# Patient Record
Sex: Female | Born: 1955 | Race: White | Hispanic: No | Marital: Married | Smoking: Former smoker
Health system: Southern US, Community
[De-identification: ages and names within clinical notes are randomized; demographics above are authoritative.]

## PROBLEM LIST (undated history)

## (undated) DIAGNOSIS — F32A Depression, unspecified: Secondary | ICD-10-CM

## (undated) DIAGNOSIS — R0902 Hypoxemia: Secondary | ICD-10-CM

## (undated) DIAGNOSIS — R519 Headache, unspecified: Secondary | ICD-10-CM

## (undated) DIAGNOSIS — K7689 Other specified diseases of liver: Secondary | ICD-10-CM

## (undated) DIAGNOSIS — I700 Atherosclerosis of aorta: Secondary | ICD-10-CM

## (undated) DIAGNOSIS — J209 Acute bronchitis, unspecified: Secondary | ICD-10-CM

## (undated) DIAGNOSIS — J439 Emphysema, unspecified: Secondary | ICD-10-CM

## (undated) DIAGNOSIS — D126 Benign neoplasm of colon, unspecified: Secondary | ICD-10-CM

## (undated) DIAGNOSIS — E785 Hyperlipidemia, unspecified: Secondary | ICD-10-CM

## (undated) DIAGNOSIS — J45909 Unspecified asthma, uncomplicated: Secondary | ICD-10-CM

## (undated) DIAGNOSIS — G8929 Other chronic pain: Secondary | ICD-10-CM

## (undated) DIAGNOSIS — T4145XA Adverse effect of unspecified anesthetic, initial encounter: Secondary | ICD-10-CM

## (undated) DIAGNOSIS — F329 Major depressive disorder, single episode, unspecified: Secondary | ICD-10-CM

## (undated) DIAGNOSIS — C6991 Malignant neoplasm of unspecified site of right eye: Secondary | ICD-10-CM

## (undated) DIAGNOSIS — R51 Headache: Secondary | ICD-10-CM

## (undated) DIAGNOSIS — K449 Diaphragmatic hernia without obstruction or gangrene: Secondary | ICD-10-CM

## (undated) DIAGNOSIS — F419 Anxiety disorder, unspecified: Secondary | ICD-10-CM

## (undated) DIAGNOSIS — I10 Essential (primary) hypertension: Secondary | ICD-10-CM

## (undated) DIAGNOSIS — D4620 Refractory anemia with excess of blasts, unspecified: Secondary | ICD-10-CM

## (undated) DIAGNOSIS — T7840XA Allergy, unspecified, initial encounter: Secondary | ICD-10-CM

## (undated) DIAGNOSIS — C801 Malignant (primary) neoplasm, unspecified: Secondary | ICD-10-CM

## (undated) DIAGNOSIS — Z201 Contact with and (suspected) exposure to tuberculosis: Secondary | ICD-10-CM

## (undated) DIAGNOSIS — I452 Bifascicular block: Secondary | ICD-10-CM

## (undated) DIAGNOSIS — J449 Chronic obstructive pulmonary disease, unspecified: Secondary | ICD-10-CM

## (undated) DIAGNOSIS — K219 Gastro-esophageal reflux disease without esophagitis: Secondary | ICD-10-CM

## (undated) DIAGNOSIS — K5790 Diverticulosis of intestine, part unspecified, without perforation or abscess without bleeding: Secondary | ICD-10-CM

## (undated) HISTORY — DX: Malignant neoplasm of unspecified site of right eye: C69.91

## (undated) HISTORY — DX: Headache: R51

## (undated) HISTORY — DX: Hypoxemia: R09.02

## (undated) HISTORY — PX: TOTAL ABDOMINAL HYSTERECTOMY: SHX209

## (undated) HISTORY — DX: Other specified diseases of liver: K76.89

## (undated) HISTORY — DX: Refractory anemia with excess of blasts, unspecified: D46.20

## (undated) HISTORY — PX: TUBAL LIGATION: SHX77

## (undated) HISTORY — DX: Diaphragmatic hernia without obstruction or gangrene: K44.9

## (undated) HISTORY — DX: Hyperlipidemia, unspecified: E78.5

## (undated) HISTORY — DX: Essential (primary) hypertension: I10

## (undated) HISTORY — DX: Emphysema, unspecified: J43.9

## (undated) HISTORY — DX: Other chronic pain: G89.29

## (undated) HISTORY — DX: Bifascicular block: I45.2

## (undated) HISTORY — PX: NOSE SURGERY: SHX723

## (undated) HISTORY — DX: Unspecified asthma, uncomplicated: J45.909

## (undated) HISTORY — DX: Acute bronchitis, unspecified: J20.9

## (undated) HISTORY — DX: Gastro-esophageal reflux disease without esophagitis: K21.9

## (undated) HISTORY — DX: Atherosclerosis of aorta: I70.0

## (undated) HISTORY — DX: Diverticulosis of intestine, part unspecified, without perforation or abscess without bleeding: K57.90

## (undated) HISTORY — DX: Benign neoplasm of colon, unspecified: D12.6

## (undated) HISTORY — DX: Headache, unspecified: R51.9

## (undated) HISTORY — DX: Allergy, unspecified, initial encounter: T78.40XA

## (undated) HISTORY — DX: Contact with and (suspected) exposure to tuberculosis: Z20.1

---
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
## 1997-12-12 ENCOUNTER — Ambulatory Visit (HOSPITAL_COMMUNITY): Admission: RE | Admit: 1997-12-12 | Discharge: 1997-12-12 | Payer: Self-pay

## 1997-12-19 ENCOUNTER — Ambulatory Visit (HOSPITAL_COMMUNITY): Admission: RE | Admit: 1997-12-19 | Discharge: 1997-12-19 | Payer: Self-pay

## 1998-06-19 ENCOUNTER — Ambulatory Visit (HOSPITAL_COMMUNITY): Admission: RE | Admit: 1998-06-19 | Discharge: 1998-06-19 | Payer: Self-pay | Admitting: Obstetrics and Gynecology

## 1998-06-19 ENCOUNTER — Encounter: Payer: Self-pay | Admitting: Obstetrics and Gynecology

## 1998-12-23 ENCOUNTER — Encounter: Payer: Self-pay | Admitting: Obstetrics and Gynecology

## 1998-12-23 ENCOUNTER — Ambulatory Visit (HOSPITAL_COMMUNITY): Admission: RE | Admit: 1998-12-23 | Discharge: 1998-12-23 | Payer: Self-pay | Admitting: Obstetrics and Gynecology

## 1999-12-27 ENCOUNTER — Encounter: Payer: Self-pay | Admitting: Obstetrics and Gynecology

## 1999-12-27 ENCOUNTER — Ambulatory Visit (HOSPITAL_COMMUNITY): Admission: RE | Admit: 1999-12-27 | Discharge: 1999-12-27 | Payer: Self-pay | Admitting: Obstetrics and Gynecology

## 2000-01-11 HISTORY — PX: BREAST BIOPSY: SHX20

## 2001-01-15 ENCOUNTER — Encounter: Payer: Self-pay | Admitting: Obstetrics and Gynecology

## 2001-01-15 ENCOUNTER — Ambulatory Visit (HOSPITAL_COMMUNITY): Admission: RE | Admit: 2001-01-15 | Discharge: 2001-01-15 | Payer: Self-pay | Admitting: Obstetrics and Gynecology

## 2002-01-25 ENCOUNTER — Ambulatory Visit (HOSPITAL_COMMUNITY): Admission: RE | Admit: 2002-01-25 | Discharge: 2002-01-25 | Payer: Self-pay | Admitting: Obstetrics and Gynecology

## 2002-01-25 ENCOUNTER — Encounter: Payer: Self-pay | Admitting: Obstetrics and Gynecology

## 2003-03-07 ENCOUNTER — Ambulatory Visit (HOSPITAL_COMMUNITY): Admission: RE | Admit: 2003-03-07 | Discharge: 2003-03-07 | Payer: Self-pay | Admitting: Obstetrics and Gynecology

## 2003-12-06 ENCOUNTER — Emergency Department (HOSPITAL_COMMUNITY): Admission: EM | Admit: 2003-12-06 | Discharge: 2003-12-06 | Payer: Self-pay | Admitting: Emergency Medicine

## 2004-05-14 ENCOUNTER — Ambulatory Visit (HOSPITAL_COMMUNITY): Admission: RE | Admit: 2004-05-14 | Discharge: 2004-05-14 | Payer: Self-pay | Admitting: Obstetrics and Gynecology

## 2005-06-03 ENCOUNTER — Ambulatory Visit (HOSPITAL_COMMUNITY): Admission: RE | Admit: 2005-06-03 | Discharge: 2005-06-03 | Payer: Self-pay | Admitting: Obstetrics and Gynecology

## 2006-08-04 ENCOUNTER — Ambulatory Visit (HOSPITAL_COMMUNITY): Admission: RE | Admit: 2006-08-04 | Discharge: 2006-08-04 | Payer: Self-pay | Admitting: Obstetrics and Gynecology

## 2007-03-02 ENCOUNTER — Encounter: Payer: Self-pay | Admitting: Internal Medicine

## 2007-09-07 ENCOUNTER — Ambulatory Visit (HOSPITAL_COMMUNITY): Admission: RE | Admit: 2007-09-07 | Discharge: 2007-09-07 | Payer: Self-pay | Admitting: Obstetrics and Gynecology

## 2008-02-22 ENCOUNTER — Encounter: Payer: Self-pay | Admitting: Internal Medicine

## 2008-04-18 ENCOUNTER — Ambulatory Visit: Payer: Self-pay | Admitting: Internal Medicine

## 2008-04-18 DIAGNOSIS — J449 Chronic obstructive pulmonary disease, unspecified: Secondary | ICD-10-CM | POA: Insufficient documentation

## 2008-04-18 LAB — CONVERTED CEMR LAB: A-1 Antitrypsin, Ser: 148 mg/dL (ref 83–200)

## 2008-04-25 ENCOUNTER — Encounter: Payer: Self-pay | Admitting: Internal Medicine

## 2008-05-03 DIAGNOSIS — J302 Other seasonal allergic rhinitis: Secondary | ICD-10-CM | POA: Insufficient documentation

## 2008-05-03 DIAGNOSIS — I10 Essential (primary) hypertension: Secondary | ICD-10-CM | POA: Insufficient documentation

## 2008-05-03 DIAGNOSIS — K219 Gastro-esophageal reflux disease without esophagitis: Secondary | ICD-10-CM | POA: Insufficient documentation

## 2008-05-03 DIAGNOSIS — J3089 Other allergic rhinitis: Secondary | ICD-10-CM

## 2008-06-27 ENCOUNTER — Ambulatory Visit: Payer: Self-pay | Admitting: Internal Medicine

## 2008-06-27 DIAGNOSIS — R0609 Other forms of dyspnea: Secondary | ICD-10-CM | POA: Insufficient documentation

## 2008-06-27 DIAGNOSIS — R0989 Other specified symptoms and signs involving the circulatory and respiratory systems: Secondary | ICD-10-CM | POA: Insufficient documentation

## 2008-07-22 ENCOUNTER — Telehealth: Payer: Self-pay | Admitting: Internal Medicine

## 2008-09-26 ENCOUNTER — Ambulatory Visit (HOSPITAL_COMMUNITY): Admission: RE | Admit: 2008-09-26 | Discharge: 2008-09-26 | Payer: Self-pay | Admitting: Obstetrics and Gynecology

## 2008-10-08 ENCOUNTER — Telehealth: Payer: Self-pay | Admitting: Internal Medicine

## 2008-10-31 ENCOUNTER — Ambulatory Visit: Payer: Self-pay | Admitting: Internal Medicine

## 2008-10-31 DIAGNOSIS — N39498 Other specified urinary incontinence: Secondary | ICD-10-CM | POA: Insufficient documentation

## 2008-10-31 DIAGNOSIS — G4710 Hypersomnia, unspecified: Secondary | ICD-10-CM | POA: Insufficient documentation

## 2008-11-05 ENCOUNTER — Telehealth: Payer: Self-pay | Admitting: Internal Medicine

## 2008-11-19 ENCOUNTER — Encounter: Payer: Self-pay | Admitting: Internal Medicine

## 2008-11-19 ENCOUNTER — Ambulatory Visit (HOSPITAL_BASED_OUTPATIENT_CLINIC_OR_DEPARTMENT_OTHER): Admission: RE | Admit: 2008-11-19 | Discharge: 2008-11-19 | Payer: Self-pay | Admitting: Internal Medicine

## 2008-11-24 ENCOUNTER — Encounter: Payer: Self-pay | Admitting: Internal Medicine

## 2008-11-30 ENCOUNTER — Ambulatory Visit: Payer: Self-pay | Admitting: Internal Medicine

## 2008-12-08 ENCOUNTER — Ambulatory Visit: Payer: Self-pay | Admitting: Internal Medicine

## 2008-12-08 ENCOUNTER — Telehealth (INDEPENDENT_AMBULATORY_CARE_PROVIDER_SITE_OTHER): Payer: Self-pay | Admitting: *Deleted

## 2008-12-12 ENCOUNTER — Telehealth (INDEPENDENT_AMBULATORY_CARE_PROVIDER_SITE_OTHER): Payer: Self-pay | Admitting: *Deleted

## 2008-12-12 ENCOUNTER — Ambulatory Visit: Payer: Self-pay | Admitting: Internal Medicine

## 2009-01-05 ENCOUNTER — Telehealth (INDEPENDENT_AMBULATORY_CARE_PROVIDER_SITE_OTHER): Payer: Self-pay | Admitting: *Deleted

## 2009-01-08 ENCOUNTER — Telehealth: Payer: Self-pay | Admitting: Internal Medicine

## 2009-01-08 ENCOUNTER — Encounter: Payer: Self-pay | Admitting: Internal Medicine

## 2009-06-12 ENCOUNTER — Encounter: Payer: Self-pay | Admitting: Internal Medicine

## 2009-09-28 ENCOUNTER — Ambulatory Visit: Payer: Self-pay | Admitting: Internal Medicine

## 2009-09-28 DIAGNOSIS — J209 Acute bronchitis, unspecified: Secondary | ICD-10-CM | POA: Insufficient documentation

## 2009-10-05 ENCOUNTER — Telehealth (INDEPENDENT_AMBULATORY_CARE_PROVIDER_SITE_OTHER): Payer: Self-pay | Admitting: *Deleted

## 2009-10-23 ENCOUNTER — Ambulatory Visit (HOSPITAL_COMMUNITY): Admission: RE | Admit: 2009-10-23 | Discharge: 2009-10-23 | Payer: Self-pay | Admitting: Obstetrics and Gynecology

## 2010-02-05 ENCOUNTER — Ambulatory Visit: Admit: 2010-02-05 | Payer: Self-pay | Admitting: Internal Medicine

## 2010-02-09 NOTE — Progress Notes (Signed)
Summary: RX REQ/ STILL SICK - rx zithromax  Phone Note Call from Patient   Caller: Patient Call For: YOUNG Summary of Call: PT WAS SEEN LAST WK FOR SOB/ COUGH/ NASAL CONGESTION. HAS 1 DAY REMAINING ON ABX. STATES SHE IS "NOT ANY BETTER AT ALL". [facility] DRUG # [phone]. PT # [id number] Initial call taken by: Tivis Ringer, CNA,  October 05, 2009 10:25 AM  Follow-up for Phone Call        pt last seen by CY on 09/28/2009.  Pt was given rx for doxycycline and tussionex and told to take mucinex and finish pred taper.  [id number]. Arman Filter LPN  October 05, 2009 10:40 AM   pt returned our call.  Pt states she has seen only "very little improvement" since her ov with CY on 09/28/2009.  Pt states she has finished pred taper and took last dose of abx today.  Pt c/o head and chest congestion, still coughing up green to brown sputum, PND, "hot flashes", sneezing, sore throat and increased sob.  Pt states she has needed to use rescue hfa 4 x daily to help with increased sob.  Pt requests rx for another week of abx.  Will forward message to CY to address.  Aundra Millet Reynolds LPN  October 05, 2009 10:48 AM   Allergies:  Biaxin, Codeine, PCN  Additional Follow-up for Phone Call Additional follow up Details #1::        If she can take Z-pak, that would be a good choice now, along with lots of fluids to keep mucus thin.  Additional Follow-up by: Waymon Budge MD,  October 05, 2009 12:29 PM    Additional Follow-up for Phone Call Additional follow up Details #2::    Spoke with pt and advised that zpack was sent and also that she should increase sputum to keep mucus thin.  Pt verbalized understanding. Follow-up by: Vernie Murders,  October 05, 2009 1:16 PM  New/Updated Medications: ZITHROMAX Z-PAK 250 MG TABS (AZITHROMYCIN) take as directed Prescriptions: ZITHROMAX Z-PAK 250 MG TABS (AZITHROMYCIN) take as directed  #1 x 0   Entered by:   Vernie Murders   Authorized by:   Waymon Budge MD   Signed by:   Vernie Murders on 10/05/2009   Method used:   Electronically to        [facility] (retail)       [address]       [place], [place]  [address]       Ph: [phone]       Fax: [phone]   RxID:   [id number]

## 2010-02-09 NOTE — Letter (Signed)
[facility]   Imported By: Sherian Rein 06/22/2009 10:57:57  _____________________________________________________________________  External Attachment:    Type:   Image     Comment:   External Document

## 2010-02-09 NOTE — Assessment & Plan Note (Signed)
Summary: sick/cb   Primary Provider/Referring Provider:  Belenda Cruise Hicks/ [place]  CC:  Acute visit, pt became sick on cruise, given prednisone taper, symptons are prod cough green , increase sob, nasal congestion x 10 days, and using resue inhaler with little relief.  History of Present Illness: December 08, 2008- COPD Acute sick visit. 6 days, head and chest congested, runny nose, coughing green;brown. Temp to 100, 2 days ago. Coworker has been sick.  28-Dec-2008- COPD,  She returns for f/u of acute visit 11/29 and also for f/u of sleep study. Chest better, with residual mucus and congestion she can"t quite get up. Residual soreness through mid chest substernal area with pressure during inhalation. No fever now. Head congestion is better. Sleep- Extreme chronic fatigue. Not sleepy, but gives out easily. Worn out by walk into work. She seems to be going to bed early enough, with fair sleep habit, not aware of snore or limb jerks Cardiac- RBBB hx, family hx heart disease. Heart stress test "fine" in [facility].  September 28, 2009- COPD Acute visit- Was feeling bad "allergy to ragweed" with malaise, throat tickle. Went to [place] [place] 10 days ago, left for cruise next day. Sicker on cruise given prednisone taper and she used her inhalers. Got back from cruise 4 days ago. Bedridden, coughing, sweating, fever with nasal congestion, chest congestion. Feels "burning up" but temp now measures 98. Cough productive green.     Preventive Screening-Counseling & Management  Alcohol-Tobacco     Smoking Status: quit > 6 months     Year Quit: 2009  Current Medications (verified): 1)  Ventolin Hfa 108 (90 Base) Mcg/act Aers (Albuterol Sulfate) .... 2 Puffs Four Times A Day As Needed 2)  Spiriva Handihaler 18 Mcg Caps (Tiotropium Bromide Monohydrate) .... Once Daily 3)  Omeprazole 40 Mg Cpdr (Omeprazole) .... Once Daily 4)  Lexapro 20 Mg Tabs (Escitalopram Oxalate) .... Once Daily 5)   Pentoxifylline Cr 400 Mg Cr-Tabs (Pentoxifylline) .... Two Times A Day 6)  Hyzaar 100-25 Mg Tabs (Losartan Potassium-Hctz) .... Once Daily 7)  Enjuvia 0.625 Mg Tabs (Estrogens Conj Synthetic B) .... Once Daily 8)  Bupropion Hcl 300 Mg Xr24h-Tab (Bupropion Hcl) .... Once Daily 9)  Allegra 180 Mg Tabs (Fexofenadine Hcl) .... Once Daily As Needed 10)  Sumatriptan Succinate 100 Mg Tabs (Sumatriptan Succinate) .... As Needed Migraines 11)  Zomig 5 Mg Soln (Zolmitriptan) .... As Needed Migraines 12)  Symbicort 80-4.5 Mcg/act Aero (Budesonide-Formoterol Fumarate) .... 2 Puffs and Rinse, Twice Every Day 13)  Prednisone 10 Mg Tabs (Prednisone) .... Taper As Directed  Allergies: 1)  ! Biaxin 2)  ! Codeine 3)  ! Pcn  Past History:  Past Surgical History: Last updated: 04/18/2008 Total Abdominal Hysterectomy Tubal ligation Breast bx 2002  Family History: Last updated: December 28, 2008 Mother- died emphysema, and MI-smoker Father- died CHF  Asthma- sister, uncle Arthritis- grandmother Cancer- GM, sister, 3 aunts, uncles  Social History: Last updated: 06/27/2008 Patient states former smoker. 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 - husband       smokes  Risk Factors: Smoking Status: quit > 6 months (09/28/2009) Passive Smoke Exposure: yes (06/27/2008)  Past Medical History: ? hx childhood asthma G E R D Allergic Rhinitis Hypertension RBBB TB exposure- uncle- Her PPD NEG Chronic headaches Acute bronchitis  Social History: Smoking Status:  quit > 6 months  Review of Systems      See HPI  The patient complains of shortness of breath with activity, productive cough, and nasal congestion/difficulty breathing through nose.  The patient denies shortness of breath at rest, non-productive cough, coughing up blood, chest pain, irregular heartbeats, acid heartburn, indigestion, loss of appetite,  weight change, abdominal pain, difficulty swallowing, sore throat, tooth/dental problems, headaches, and sneezing.    Vital Signs:  Patient profile:   54 year old female Height:      62 inches Weight:      149.4 pounds BMI:     27.42 O2 Sat:      97 % on Room air Pulse rate:   112 / minute BP sitting:   140 / 84  (left arm)  Vitals Entered By: Renold Genta RCP, LPN (September 28, 2009 2:17 PM)  O2 Sat at Rest %:  99% O2 Flow:  Room air CC: Acute visit, pt became sick on cruise, given prednisone taper, symptons are prod cough green , increase sob, nasal congestion x 10 days, using resue inhaler with little relief Comments Medications reviewed with patient Renold Genta RCP, LPN  September 28, 2009 2:17 PM    Physical Exam  Additional Exam:  General: A/Ox3; pleasant and cooperative, NAD, wdwn, acutelly ill SKIN: no rash, lesions NODES: no lymphadenopathy HEENT: [place]/AT, EOM- WNL, Conjuctivae- clear, PERRLA, TM-WNL, Nose- clear, Throat- clear and wnl, Mallampatii III-IV NECK: Supple w/ fair ROM, JVD- none, normal carotid impulses w/o bruits Thyroid- normal to palpation CHEST: Hard raspy cough HEART: RRR, no m/g/r heard ABDOMEN: Soft and nl; ZOX:WRUE, nl pulses, no edema  NEURO: Grossly intact to observation      Impression & Recommendations:  Problem # 1:  BRONCHITIS, ACUTE (ICD-466.0)  Infection is much more likely than an intense ragweed allergy, which is what she thought at first she was dealing with. We will give neb, depo, script for doxycycline. She thinks she can handle hydrocodone/ tussionex. The following medications were removed from the medication list:    Avelox 400 Mg Tabs (Moxifloxacin hcl) .Marland Kitchen... Take 1 tablet by mouth once a day x 7 days Her updated medication list for this problem includes:    Ventolin Hfa 108 (90 Base) Mcg/act Aers (Albuterol sulfate) .Marland Kitchen... 2 puffs four times a day as needed    Spiriva Handihaler 18 Mcg Caps (Tiotropium bromide  monohydrate) ..... Once daily    Symbicort 80-4.5 Mcg/act Aero (Budesonide-formoterol fumarate) .Marland Kitchen... 2 puffs and rinse, twice every day    Doxycycline Hyclate 100 Mg Caps (Doxycycline hyclate) .Marland Kitchen... 2 today then one daily    Tussionex Pennkinetic Er 10-8 Mg/17ml Lqcr (Hydrocod polst-chlorphen polst) .Marland Kitchen... 1 tablespoon two times a day as needed cough  Problem # 2:  ALLERGIC RHINITIS (ICD-477.9)  Rhinitis with rhinosinusitis. Her updated medication list for this problem includes:    Allegra 180 Mg Tabs (Fexofenadine hcl) ..... Once daily as needed  Problem # 3:  G E R D (ICD-530.81) Interaction of reeflux and potential for aspiration were discussed in context of her lung disease. Her updated medication list for this problem includes:    Omeprazole 40 Mg Cpdr (Omeprazole) ..... Once daily  Medications Added to Medication List This Visit: 1)  Prednisone 10 Mg Tabs (Prednisone) .... Taper as directed 2)  Doxycycline Hyclate 100 Mg Caps (Doxycycline hyclate) .... 2 today then one daily 3)  Tussionex Pennkinetic Er 10-8 Mg/18ml Lqcr (Hydrocod polst-chlorphen polst) .Marland Kitchen.. 1 tablespoon two times a day as needed cough  Other Orders: Est. Patient Level IV (45409) Nebulizer Tx (81191) Depo- Medrol  80mg  (J1040) Admin of Therapeutic Inj  intramuscular or subcutaneous ([address])  Patient Instructions: 1)  Please schedule a follow-up appointment in 3 months. 2)  Lots of fluids and rest. Mucinex may help thin the mucus 3)  Script for doxycycline 4)  Script for Tussionex for cough 5)  Neb xop 1.25 6)  depo 80 7)  finish the last of the prednisone taper you ar on now, then we will wait and see how you do before adding more. Prescriptions: SYMBICORT 80-4.5 MCG/ACT AERO (BUDESONIDE-FORMOTEROL FUMARATE) 2 puffs and rinse, twice every day  #1 x prn   Entered and Authorized by:   Waymon Budge MD   Signed by:   Waymon Budge MD on 09/28/2009   Method used:   Print then Give to Patient   RxID:    [id number] SPIRIVA HANDIHALER 18 MCG CAPS (TIOTROPIUM BROMIDE MONOHYDRATE) once daily  #1 x prn   Entered and Authorized by:   Waymon Budge MD   Signed by:   Waymon Budge MD on 09/28/2009   Method used:   Print then Give to Patient   RxID:   [id number] TUSSIONEX PENNKINETIC ER 10-8 MG/5ML LQCR (HYDROCOD POLST-CHLORPHEN POLST) 1 tablespoon two times a day as needed cough  #200 ml x 0   Entered and Authorized by:   Waymon Budge MD   Signed by:   Waymon Budge MD on 09/28/2009   Method used:   Print then Give to Patient   RxID:   [id number] DOXYCYCLINE HYCLATE 100 MG CAPS (DOXYCYCLINE HYCLATE) 2 today then one daily  #8 x 0   Entered and Authorized by:   Waymon Budge MD   Signed by:   Waymon Budge MD on 09/28/2009   Method used:   Print then Give to Patient   RxID:   [id number]    Medication Administration  Injection # 1:    Medication: Depo- Medrol 80mg     Diagnosis: BRONCHITIS, ACUTE (ICD-466.0)    Route: IM    Site: LUOQ gluteus    Exp Date: 10/23/2009    Lot #: [id number]    Mfr: Teva    Patient tolerated injection without complications    Given by: Renold Genta RCP, LPN (September 28, 2009 5:17 PM)  Orders Added: 1)  Est. Patient Level IV [63875] 2)  Nebulizer Tx [64332] 3)  Depo- Medrol 80mg  [J1040] 4)  Admin of Therapeutic Inj  intramuscular or subcutaneous [96372]        received phone call from pharmacist @ [facility], requesting clarification on tussionex script.  okayed for rx to be changed to 1 teaspoon two times a day as needed cough.  pharmacist read this back to me for verification. Boone Master CNA/MA  September 28, 2009 5:32 PM

## 2010-02-09 NOTE — Letter (Signed)
[facility]: [facility]   Imported By: Lester Pepin 01/16/2009 08:37:36  _____________________________________________________________________  External Attachment:    Type:   Image     Comment:   External Document

## 2010-04-19 ENCOUNTER — Ambulatory Visit (INDEPENDENT_AMBULATORY_CARE_PROVIDER_SITE_OTHER): Payer: 59 | Admitting: Internal Medicine

## 2010-04-19 ENCOUNTER — Encounter: Payer: Self-pay | Admitting: Internal Medicine

## 2010-04-19 VITALS — BP 122/76 | HR 75 | Ht 62.0 in | Wt 150.0 lb

## 2010-04-19 DIAGNOSIS — J449 Chronic obstructive pulmonary disease, unspecified: Secondary | ICD-10-CM

## 2010-04-19 DIAGNOSIS — G4710 Hypersomnia, unspecified: Secondary | ICD-10-CM

## 2010-04-19 DIAGNOSIS — J4489 Other specified chronic obstructive pulmonary disease: Secondary | ICD-10-CM

## 2010-04-19 NOTE — Assessment & Plan Note (Addendum)
Severe COPD in former smoker. Mother had COPD.   Most of her complaint is not about dyspnea, cough or wheeze now. She no longer smokes and goal is to maintain stamina.

## 2010-04-19 NOTE — Patient Instructions (Addendum)
Order- See [facility] to schedule Multiple Sleep Latency Test for diagnosis of hypersomnia             - Test for a1AT

## 2010-04-19 NOTE — Progress Notes (Signed)
  Subjective:    Patient ID: Carol Quinn, female    DOB: Oct 27, 1955, 55 y.o.   MRN: [id number]  HPI 55 yoF former smoker, followed for COPD, allergic rhinitis, with hx GERD. Last here September 28, 2009 for Ragweed rhintis. Through the winter without bad infection- did take flu and pneumovax. Now for the past month she has noted progressive chest tightness "like ribs are coming out of my body", worse with exertion, not positional. Seems hard to breathe when she is having the feeling. Denies relation to meals. Feels trouble concentrating at work. Snores some. NPSG 11/19/08- AHI 0/hr. She describes deep groggy arousal from sleep, but not sleep paralysis. She's afraid of getting fired for being sleepy and too tired to come to work.Marland Kitchen  PFT- Moderate obstruction- FEV1/FVC 0.51. + response to BD; DLCO 0.49 a1AT WNL Review of Systems See HPI Constitutional:   No weight loss, night sweats,  Fevers, chills, fatigue, lassitude. HEENT:   No headaches,  Difficulty swallowing,  Tooth/dental problems,  Sore throat,                No sneezing, itching, ear ache, nasal congestion, post nasal drip,   CV:  No chest pain,  Orthopnea, PND, swelling in lower extremities, anasarca, dizziness, palpitations  GI  No heartburn, indigestion, abdominal pain, nausea, vomiting, diarrhea, change in bowel habits, loss of appetite  Resp:  at rest.  No excess mucus, no productive cough,  No non-productive cough,  No coughing up of blood.  No change in color of mucus.  No wheezing.  No chest wall deformity  Skin: no rash or lesions.  GU: no dysuria, change in color of urine, no urgency or frequency.  No flank pain.  MS:  No joint pain or swelling.  No decreased range of motion.  No back pain.  Psych:  No memory loss. Difficult to concentrate      Objective:   Physical Exam General- Alert, Oriented, Affect-appropriate, Distress- none acute  Looks tired/ depressed           Medium build  Skin- rash-none, lesions-  none, excoriation- none  Lymphadenopathy- none  Head- atraumatic  Eyes- Gross vision intact, PERRLA, conjunctivae clear, secretions  Ears- Hearing normal- canals, Tm L , R ,  Nose- Clear,  No-Septal dev, mucus, polyps, erosion, perforation   Throat- Mallampati II-III , mucosa clear , drainage- none, tonsils- atrophic  Neck- flexible , trachea midline, no stridor , thyroid nl, carotid no bruit  Chest - symmetrical excursion , unlabored     Heart/CV- RRR , no murmur , no gallop  , no rub, nl s1 s2                     - JVD- none , edema- none, stasis changes- none, varices- none     Lung- clear to P&A, distant, wheeze- none, cough- none , dullness-none, rub- none     Chest wall-  Abd- tender-no, distended-no, bowel sounds-present, HSM- no  Br/ Gen/ Rectal- Not done, not indicated  Extrem- cyanosis- none, clubbing, none, atrophy- none, strength- nl  Neuro- grossly intact to observation         Assessment & Plan:

## 2010-04-19 NOTE — Assessment & Plan Note (Addendum)
Sleep hygiene seems good. Caffeine has little effect. We will get an MSLT looking for objective sleepiness to correspond with her complaint. Consider that depression is a strong possibility.

## 2010-04-21 ENCOUNTER — Telehealth: Payer: Self-pay | Admitting: Internal Medicine

## 2010-04-21 NOTE — Telephone Encounter (Signed)
Spoke with patient and scheduled MSLT for Wed. 04/28/10 at 7:30 am - 5:00 pm at [facility]. Sleep packet mailed to pt. Pt is aware of appt date, time and location.

## 2010-04-22 ENCOUNTER — Encounter: Payer: Self-pay | Admitting: Internal Medicine

## 2010-04-22 ENCOUNTER — Telehealth: Payer: Self-pay | Admitting: Internal Medicine

## 2010-04-22 NOTE — Telephone Encounter (Signed)
Per Bjorn Loser, she spoke with the patient and patient is aware that they are working on this for her.

## 2010-04-22 NOTE — Telephone Encounter (Signed)
i believe pt is scheduled for slp study not CT- my error. Carol Quinn

## 2010-04-28 ENCOUNTER — Ambulatory Visit (HOSPITAL_BASED_OUTPATIENT_CLINIC_OR_DEPARTMENT_OTHER): Payer: 59 | Attending: Internal Medicine

## 2010-04-28 DIAGNOSIS — R404 Transient alteration of awareness: Secondary | ICD-10-CM | POA: Insufficient documentation

## 2010-04-29 ENCOUNTER — Telehealth: Payer: Self-pay | Admitting: Internal Medicine

## 2010-04-29 NOTE — Telephone Encounter (Signed)
[name], pls advise on where this pt can be worked into the schedule in a few weeks to discuss sleep study results. Test was done on Wed., 4/18. Pls advise.

## 2010-04-29 NOTE — Telephone Encounter (Signed)
[id number] 

## 2010-04-30 ENCOUNTER — Encounter: Payer: Self-pay | Admitting: Internal Medicine

## 2010-05-01 DIAGNOSIS — R404 Transient alteration of awareness: Secondary | ICD-10-CM

## 2010-05-01 NOTE — Procedures (Signed)
NAMELATEASHA, Carol Quinn                ACCOUNT NO.:  [id number]  MEDICAL RECORD NO.:  [id number]          PATIENT TYPE:  OUT  LOCATION:  SLEEP CENTER                 FACILITY:  [facility]  PHYSICIAN:  Arletha Marschke D. Maple Hudson, MD, FCCP, FACPDATE OF BIRTH:  April 26[birth year] 1956  DATE OF STUDY:  04/28/2010                         MULTIPLE SLEEP LATENCY TEST  REFERRING PHYSICIAN:  Joany Khatib D Koben Daman  INDICATIONS FOR STUDY:  Excessive daytime somnolence, narcolepsy versus idiopathic hypersomnolence.  EPWORTH SLEEPINESS SCORE:  21/24, BMI 27.4, weight 150 pounds, height 5 feet 2 inches, and neck 14 inches.  HOME MEDICATIONS:  Charted and reviewed.  The patient was instructed to be off all sedating and stimulating medications as outlined with her for 3 days before the study.  SLEEP ARCHITECTURE:  NAP 1:  8:33 a.m., sleep latency 8 minutes, REM latency NA.  NAP 2:  10:09 a.m., sleep latency 1 minute, REM latency NA.  NAP 3:  12:03 p.m., sleep latency 3 minutes, REM latency NA.  NAP 4:  1404 p.m., sleep latency 6.5 minutes, REM latency NA.  NAP 5:  1603 p.m., sleep latency 2 minutes, REM latency NA.  IMPRESSION/RECOMMENDATIONS: 1. Mean sleep latency across 5 naps was pathologically short at 4.1     minutes.  Normal range is considered longer than 10 minutes. 2. No sleep onset REM was associated with any of the naps, making the     findings nonspecific.  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     At clinical followup, confirm that the patient had taken no     sedating medication and had slept normally the night before.     Gunter Conde D. Maple Hudson, MD, [facility], FACP Diplomate, Biomedical engineer of Sleep Medicine Electronically Signed    CDY/MEDQ  D:  05/01/2010 10:51:51  T:  05/01/2010 57:84:69  Job:  [id number]

## 2010-05-03 NOTE — Telephone Encounter (Signed)
Called spoke with patient, advised of pending appt w/ CDY.  Pt okay with this date and time.

## 2010-05-03 NOTE — Telephone Encounter (Signed)
I have placed pt in 1015am on 05-13-10 to discuss sleep study results. Please advise patient.

## 2010-05-11 ENCOUNTER — Encounter: Payer: Self-pay | Admitting: Internal Medicine

## 2010-05-13 ENCOUNTER — Encounter: Payer: Self-pay | Admitting: Internal Medicine

## 2010-05-13 ENCOUNTER — Ambulatory Visit (INDEPENDENT_AMBULATORY_CARE_PROVIDER_SITE_OTHER): Payer: 59 | Admitting: Internal Medicine

## 2010-05-13 VITALS — BP 118/64 | HR 78 | Ht 62.0 in | Wt 149.8 lb

## 2010-05-13 DIAGNOSIS — J449 Chronic obstructive pulmonary disease, unspecified: Secondary | ICD-10-CM

## 2010-05-13 DIAGNOSIS — J4489 Other specified chronic obstructive pulmonary disease: Secondary | ICD-10-CM

## 2010-05-13 DIAGNOSIS — G4710 Hypersomnia, unspecified: Secondary | ICD-10-CM

## 2010-05-13 MED ORDER — ARMODAFINIL 150 MG PO TABS: 150.0000 mg | ORAL_TABLET | Freq: Every day | ORAL | Status: DC

## 2010-05-13 NOTE — Assessment & Plan Note (Signed)
Clear today 

## 2010-05-13 NOTE — Progress Notes (Signed)
  Subjective:    Patient ID: Carol Quinn, female    DOB: May 03, 1955, 55 y.o.   MRN: [id number]  HPI 4 yoF former smoker followed for COPD and hypersomnia. Last here Sept 19, when she had been sick after a cruise. She did gradually get better. Comes now- to f/u after MSLT done for assessment of her persistent c/o "tired". Today she also says she has been having some sore throat this week w/o fever. Some dry cough. Denies fever or nodes. Lab- MSLT 04/28/10- short at 4.1 minutes, SOREM 0, so nonspecific, c/w primary ideopathic hypersomnia.   Review of Systems See HPI  Constitutional:   No weight loss, night sweats,  Fevers, chills, fatigue,  HEENT:   No headaches,  Difficulty swallowing,  Tooth/dental problems,                No sneezing, itching, ear ache, nasal congestion, post nasal drip,   CV:  No chest pain,  Orthopnea, PND, swelling in lower extremities, anasarca, dizziness, palpitations  GI  No heartburn, indigestion, abdominal pain, nausea, vomiting, diarrhea, change in bowel habits, loss of appetite  Resp: No excess mucus, no productive cough,  No non-productive cough,  No coughing up of blood.  No change in color of mucus.  No wheezing.  No chest wall deformity  Skin: no rash or lesions.  GU: no dysuria, change in color of urine, no urgency or frequency.  No flank pain.  MS:  No joint pain or swelling.  No decreased range of motion.  No back pain.  Psych:  No change in mood or affect. No depression or anxiety.  No memory loss.      Objective:   Physical Exam General- Alert, Oriented, Affect-appropriate, Distress- none acute  Skin- rash-none, lesions- none, excoriation- none  Lymphadenopathy- none  Head- atraumatic  Eyes- Gross vision intact, PERRLA, conjunctivae clear secretions  Ears- Normal- Hearing, canals,   Nose- Clear, No- Septal dev, mucus, polyps, erosion, perforation   Throat- Mallampati II , mucosa clear , drainage- none, tonsils- atrophic  Neck-  flexible , trachea midline, no stridor , thyroid nl, carotid no bruit  Chest - symmetrical excursion , unlabored     Heart/CV- RRR , no murmur , no gallop  , no rub, nl s1 s2                     - JVD- none , edema- none, stasis changes- none, varices- none     Lung- clear to P&A, wheeze- none, cough- none , dullness-none, rub- none     Chest wall-  Abd- tender over right lower anterior rib edge- not abdomen , distended-no, bowel sounds-present, HSM- no  Br/ Gen/ Rectal- Not done, not indicated  Extrem- cyanosis- none, clubbing, none, atrophy- none, strength- nl  Neuro- grossly intact to observation         Assessment & Plan:

## 2010-05-13 NOTE — Patient Instructions (Signed)
Suggest 8-9 hours of sleep at night and 15-30 minute nap twice daily when needed.  Sample/ script Nuvigil 150 mg, one each morning when needed- you can skip doses.

## 2010-05-13 NOTE — Assessment & Plan Note (Signed)
Ideopathic hypersomnia. We discussed sleep hygiene, sleep/ naps, and available meds. We will try again with another sample of Nuvigil.  We will consider the other meds as necessary.

## 2010-05-18 ENCOUNTER — Ambulatory Visit (INDEPENDENT_AMBULATORY_CARE_PROVIDER_SITE_OTHER): Payer: 59 | Admitting: Family Medicine

## 2010-05-18 ENCOUNTER — Encounter: Payer: Self-pay | Admitting: Family Medicine

## 2010-05-18 DIAGNOSIS — I10 Essential (primary) hypertension: Secondary | ICD-10-CM

## 2010-05-18 DIAGNOSIS — F3289 Other specified depressive episodes: Secondary | ICD-10-CM

## 2010-05-18 DIAGNOSIS — F32A Depression, unspecified: Secondary | ICD-10-CM

## 2010-05-18 DIAGNOSIS — G4710 Hypersomnia, unspecified: Secondary | ICD-10-CM

## 2010-05-18 DIAGNOSIS — J449 Chronic obstructive pulmonary disease, unspecified: Secondary | ICD-10-CM

## 2010-05-18 DIAGNOSIS — J4489 Other specified chronic obstructive pulmonary disease: Secondary | ICD-10-CM

## 2010-05-18 DIAGNOSIS — G43109 Migraine with aura, not intractable, without status migrainosus: Secondary | ICD-10-CM

## 2010-05-18 DIAGNOSIS — F329 Major depressive disorder, single episode, unspecified: Secondary | ICD-10-CM

## 2010-05-18 NOTE — Patient Instructions (Signed)
Schedule a follow up at your convenience to discuss dryness Call with any questions or concerns Welcome!  We're glad to have you!!

## 2010-05-18 NOTE — Progress Notes (Signed)
  Subjective:    Patient ID: Carol Quinn, female    DOB: [birth year], 55 y.o.   MRN: [id number]  HPI Previous MD- Willa Rough.  GYN- Loucke.  No need for paps due to TAH-BSO.  UTD on mammo, colonoscopy.  HTN- chronic problem for pt, well controlled on Hyzaar.  No CP, HAs above baseline, visual changes, edema  COPD- seeing Dr Maple Hudson 2-3x/yr, on Spiriva,Symbicort daily and albuterol as needed.  Doesn't feel sxs are well controlled.  Using rescue inhaler 2-3x/week.  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  Started on samples of Nuvigil.  Has f/u pending.  Depression- on Wellbutrin and Lexapro.  This is chronic problem for pt.  Feels sxs are well controlled.  Is familiar w/ depression- works at [employer].  Migraines- has aura preceding HAs, associated w/ projectile vomiting.  chronic problem for pt, on Imitrex and Zomig prn.  Tries to avoid using meds as much as possible.     Review of Systems For ROS see HPI     Objective:   Physical Exam  Constitutional: She is oriented to person, place, and time. She appears well-developed and well-nourished. No distress.  HENT:  Head: Normocephalic and atraumatic.  Eyes: Conjunctivae and EOM are normal. Pupils are equal, round, and reactive to light.  Neck: Normal range of motion. Neck supple.  Cardiovascular: Normal rate, regular rhythm, normal heart sounds and intact distal pulses.   Pulmonary/Chest: Effort normal and breath sounds normal. No respiratory distress. She has no wheezes. She has no rales.  Musculoskeletal: She exhibits no edema.  Lymphadenopathy:    She has no cervical adenopathy.  Neurological: She is alert and oriented to person, place, and time. She has normal reflexes. No cranial nerve deficit. Coordination normal.  Skin: Skin is warm and dry.  Psychiatric:       Mildly anxious          Assessment & Plan:

## 2010-05-20 ENCOUNTER — Other Ambulatory Visit: Payer: Self-pay | Admitting: *Deleted

## 2010-05-20 MED ORDER — OMEPRAZOLE 40 MG PO CPDR: 40.0000 mg | DELAYED_RELEASE_CAPSULE | Freq: Every day | ORAL | Status: DC

## 2010-05-20 NOTE — Telephone Encounter (Signed)
Pt.notified

## 2010-05-20 NOTE — Telephone Encounter (Signed)
Prescription sent

## 2010-05-23 ENCOUNTER — Encounter: Payer: Self-pay | Admitting: Internal Medicine

## 2010-05-25 DIAGNOSIS — F32A Depression, unspecified: Secondary | ICD-10-CM | POA: Insufficient documentation

## 2010-05-25 DIAGNOSIS — F329 Major depressive disorder, single episode, unspecified: Secondary | ICD-10-CM | POA: Insufficient documentation

## 2010-05-25 DIAGNOSIS — G43109 Migraine with aura, not intractable, without status migrainosus: Secondary | ICD-10-CM | POA: Insufficient documentation

## 2010-05-25 NOTE — Letter (Signed)
November 10, 2008    Gwenith Spitz. [place]  [address]  [place], [place]  [address]   RE:  [name], [name]  MRN:  [id number]  /  DOB:  08/22/1955   To Whom It May Concern:   Carol Quinn is under my medical care for respiratory problems.  She has  experienced worsened cough and shortness of breath with exposure to  strong odors including Clorox and smoke.  She asks where possible that  her work place be fragrance-free.    Sincerely,      Clinton D. Maple Hudson, MD, Tonny Bollman, FACP  Electronically Signed    CDY/MedQ  DD: 11/10/2008  DT: 11/11/2008  Job #: [id number]

## 2010-05-25 NOTE — Assessment & Plan Note (Signed)
This is new dx for pt- has f/u pending.  Starting Nuvigil.  This may worsen pt's apparent underlying anxiety.  Will follow along w/ pulm

## 2010-05-25 NOTE — Assessment & Plan Note (Signed)
Pt doesn't feel sxs are well controlled but admits she doesn't have a frame of reference for her dz severity.  Encouraged her to discuss this w/ Dr Maple Hudson- med regimen appropriate.  Will defer changing/titrating meds.

## 2010-05-25 NOTE — Assessment & Plan Note (Signed)
Pt has both oral and nasal triptan available for use.  Talked about importance of taking meds early in HA course to have best chance of aborting HA prior to full blown migraine.  Pt expressed understanding.

## 2010-05-25 NOTE — Assessment & Plan Note (Signed)
Well controlled on current meds. No changes at this time

## 2010-05-25 NOTE — Assessment & Plan Note (Signed)
Pt feels sxs are well controlled on current meds.  Will follow.

## 2010-05-28 ENCOUNTER — Ambulatory Visit: Payer: 59 | Admitting: Family Medicine

## 2010-06-10 ENCOUNTER — Encounter: Payer: Self-pay | Admitting: Internal Medicine

## 2010-06-21 ENCOUNTER — Encounter: Payer: Self-pay | Admitting: Internal Medicine

## 2010-06-24 ENCOUNTER — Ambulatory Visit (INDEPENDENT_AMBULATORY_CARE_PROVIDER_SITE_OTHER): Payer: 59 | Admitting: Internal Medicine

## 2010-06-24 ENCOUNTER — Encounter: Payer: Self-pay | Admitting: Internal Medicine

## 2010-06-24 VITALS — BP 120/74 | HR 84 | Ht 62.0 in | Wt 146.0 lb

## 2010-06-24 DIAGNOSIS — G4710 Hypersomnia, unspecified: Secondary | ICD-10-CM

## 2010-06-24 DIAGNOSIS — J449 Chronic obstructive pulmonary disease, unspecified: Secondary | ICD-10-CM

## 2010-06-24 NOTE — Patient Instructions (Addendum)
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

## 2010-06-24 NOTE — Progress Notes (Signed)
Subjective:    Patient ID: Carol Quinn, female    DOB: [birth year], 55 y.o.   MRN: [id number]  HPI  Subjective:    Patient ID: Carol Quinn, female    DOB: 1955/10/22, 55 y.o.   MRN: [id number]  HPI 55 yoF former smoker followed for COPD and hypersomnia. Last here Sept 19, when she had been sick after a cruise. She did gradually get better. Comes now- to f/u after MSLT done for assessment of her persistent c/o "tired". Today she also says she has been having some sore throat this week w/o fever. Some dry cough. Denies fever or nodes. Lab- MSLT 04/28/10- short at 4.1 minutes, SOREM 0, so nonspecific, c/w primary ideopathic hypersomnia.  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 Still has a sense of fatigue, feeling she can't put one foot in front of another,  and easy confusion. Caffeine has no effect. Thyroid has been checked a lot. Sister has lupus, but she has tested negative.  Review of Systems See HPI  Constitutional:   No weight loss, night sweats,  Fevers, chills, fatigue,  HEENT:   No headaches,  Difficulty swallowing,  Tooth/dental problems,                No sneezing, itching, ear ache, nasal congestion, post nasal drip,   CV:  No chest pain,  Orthopnea, PND, swelling in lower extremities, anasarca, dizziness, palpitations  GI  No heartburn, indigestion, abdominal pain, nausea, vomiting, diarrhea, change in bowel habits, loss of appetite  Resp: No excess mucus, no productive cough,  No non-productive cough,  No coughing up of blood.  No change in color of mucus.  No wheezing.  Skin: no rash or lesions.  GU: no dysuria, change in color of urine, no urgency or frequency.  No flank pain.  MS:  No joint pain or swelling.  No decreased range of motion.  No back pain.  Psych:  No change in mood or affect. No acute depression or anxiety.   No memory loss.      Objective:   Physical Exam General- Alert, Oriented, Affect-appropriate, Distress- none acute  Skin- rash-none, lesions- none, excoriation- none  Lymphadenopathy- none  Head- atraumatic  Eyes- Gross vision intact, PERRLA, conjunctivae clear secretions  Ears- Normal- Hearing, canals,   Nose- Clear, No- Septal dev, mucus, polyps, erosion, perforation   Throat- Mallampati II , mucosa clear , drainage- none, tonsils- atrophic  Neck- flexible , trachea midline, no stridor , thyroid nl, carotid no bruit  Chest - symmetrical excursion , unlabored     Heart/CV- RRR , no murmur , no gallop  , no rub, nl s1 s2                     - JVD- none , edema- none, stasis changes- none, varices- none     Lung- clear to P&A, wheeze- none, cough- none , dullness-none, rub- none     Chest wall-  Abd-not tender, distended-no, bowel sounds-present, HSM- no  Br/ Gen/ Rectal- Not done, not indicated  Extrem- cyanosis- none, clubbing, none, atrophy- none, strength- nl  Neuro- grossly intact to observation         Assessment & Plan:     Review of Systems     Objective:   Physical Exam        Assessment & Plan:

## 2010-06-24 NOTE — Assessment & Plan Note (Signed)
Nuvigil 150 is some help. We will let her try increasing this dose, but again emphasized use of naps.  Will give sample Nuvigil 250 mg, but also try 1 and 1/2 of the 150 mg tabs.

## 2010-06-27 NOTE — Assessment & Plan Note (Signed)
Not uncomfortable with breathing at time of this visit.

## 2010-07-17 ENCOUNTER — Encounter: Payer: Self-pay | Admitting: Family Medicine

## 2010-07-17 ENCOUNTER — Ambulatory Visit (INDEPENDENT_AMBULATORY_CARE_PROVIDER_SITE_OTHER): Payer: 59 | Admitting: Family Medicine

## 2010-07-17 VITALS — BP 120/76 | HR 76 | Temp 97.7°F | Ht 62.0 in | Wt 143.0 lb

## 2010-07-17 DIAGNOSIS — W57XXXA Bitten or stung by nonvenomous insect and other nonvenomous arthropods, initial encounter: Secondary | ICD-10-CM

## 2010-07-17 DIAGNOSIS — S30860A Insect bite (nonvenomous) of lower back and pelvis, initial encounter: Secondary | ICD-10-CM

## 2010-07-17 MED ORDER — DOXYCYCLINE HYCLATE 100 MG PO CAPS: 100.0000 mg | ORAL_CAPSULE | Freq: Two times a day (BID) | ORAL | Status: AC

## 2010-07-17 NOTE — Progress Notes (Addendum)
  Subjective:    Patient ID: KALIAH HADDAWAY, female    DOB: 1955/04/20, 55 y.o.   MRN: [id number]  HPI  55 yo here for ?infected bug bite. Was out in yard earlier this week and felt something bite her bottom. Didn't think much of it.  Itched all night. Last few nights, itchy and painful. Has had some chills. No fevers, some slight nausea. No vomiting.  Husband looked at it last night and said it looked infected and wanted her to be seen today.  Review of Systems    See HPI Objective:   Physical Exam BP 120/76  Pulse 76  Temp(Src) 97.7 F (36.5 C) (Oral)  Ht 5\' 2"  (1.575 m)  Wt 143 lb (64.864 kg)  BMI 26.15 kg/m2 Gen:  Alert, pleasant, NAD Skin:  2 cm, erythematous area on uppper left buttocks with several small fluid filled pustules, clustered yet somewhat linear.        Assessment & Plan:   1. Insect bite   New. Not classic for insect bite, has more of a herpes or shingles appearance although pt is sure she felt something bite her. Outside window for valtrex anyway if this was shingles and no ability to culture the lesion in weekend clinic to rule out HSV.  Will therefore  treat with doxy( PCN allergic) x 10 days to cover cellulitis. Pt to follow up with primary MD next week if no improvement to culture lesion.Marland Kitchen

## 2010-08-16 ENCOUNTER — Telehealth: Payer: Self-pay | Admitting: Internal Medicine

## 2010-08-16 NOTE — Telephone Encounter (Signed)
Spoke with pt. She states would like rx called in for nuvigil 250 mg. She states that this worked well for her. She has however, not tried tking 1 1/2 of the 150 mg tablets as advised at last ov. She states that she can try this if still necc, CDY, pls advise thanks!

## 2010-08-16 NOTE — Telephone Encounter (Signed)
Almost all insurance companies are now requiring a trial of the less expensive med Provigil, which is very similar and has just gone generic, before they will approve Nuvigil.  Please give script trial Provigil 200 mg. # 30, 1 daily, ref x 5. This will also require prior auth.

## 2010-08-17 MED ORDER — ARMODAFINIL 250 MG PO TABS: 250.0000 mg | ORAL_TABLET | Freq: Every day | ORAL | Status: DC

## 2010-08-17 NOTE — Telephone Encounter (Signed)
OK to cancel script for Provigil and Rx Nuvigil 250, # 30, 1 daily as needed, refill x 5.

## 2010-08-17 NOTE — Telephone Encounter (Signed)
Pt informed and Rx called to [facility] per pt's request.

## 2010-08-17 NOTE — Telephone Encounter (Signed)
I spoke with patient; she states she was given a sample of Nuvigil 250mg  and it worked well for patient-she was under the impression if it worked well then we would give her a Advertising account executive. Pt's insurance company covers this med and no PA will be needed. CY, please advise if okay to send RX.    I mailed a discount card for Symibcort at the request of the patient.

## 2010-08-20 ENCOUNTER — Ambulatory Visit (INDEPENDENT_AMBULATORY_CARE_PROVIDER_SITE_OTHER): Payer: 59 | Admitting: Family Medicine

## 2010-08-20 ENCOUNTER — Encounter: Payer: Self-pay | Admitting: Family Medicine

## 2010-08-20 DIAGNOSIS — L989 Disorder of the skin and subcutaneous tissue, unspecified: Secondary | ICD-10-CM

## 2010-08-20 DIAGNOSIS — G4710 Hypersomnia, unspecified: Secondary | ICD-10-CM

## 2010-08-20 DIAGNOSIS — H04129 Dry eye syndrome of unspecified lacrimal gland: Secondary | ICD-10-CM

## 2010-08-20 DIAGNOSIS — K589 Irritable bowel syndrome without diarrhea: Secondary | ICD-10-CM

## 2010-08-20 LAB — CBC WITH DIFFERENTIAL/PLATELET
Basophils Absolute: 0.1 10*3/uL (ref 0.0–0.1)
Basophils Relative: 1.3 % (ref 0.0–3.0)
Eosinophils Absolute: 0.2 10*3/uL (ref 0.0–0.7)
Eosinophils Relative: 3.8 % (ref 0.0–5.0)
HCT: 41 % (ref 36.0–46.0)
Hemoglobin: 13.9 g/dL (ref 12.0–15.0)
Lymphocytes Relative: 32.9 % (ref 12.0–46.0)
Lymphs Abs: 1.6 10*3/uL (ref 0.7–4.0)
MCHC: 33.8 g/dL (ref 30.0–36.0)
MCV: 96.7 fl (ref 78.0–100.0)
Monocytes Absolute: 0.3 10*3/uL (ref 0.1–1.0)
Monocytes Relative: 6 % (ref 3.0–12.0)
Neutro Abs: 2.7 10*3/uL (ref 1.4–7.7)
Neutrophils Relative %: 56 % (ref 43.0–77.0)
Platelets: 291 10*3/uL (ref 150.0–400.0)
RBC: 4.24 Mil/uL (ref 3.87–5.11)
RDW: 12.8 % (ref 11.5–14.6)
WBC: 4.9 10*3/uL (ref 4.5–10.5)

## 2010-08-20 LAB — BASIC METABOLIC PANEL
CO2: 31 mEq/L (ref 19–32)
Chloride: 108 mEq/L (ref 96–112)
GFR: 115.3 mL/min (ref >60.00)
Glucose, Bld: 84 mg/dL (ref 70–99)
Potassium: 3.8 mEq/L (ref 3.5–5.1)
Sodium: 145 mEq/L (ref 135–145)

## 2010-08-20 NOTE — Patient Instructions (Signed)
We'll notify you of your lab results Please call Dr Mayford Knife and set up an appt Make sure you are drinking plenty of fluids, eat plenty of fiber- this will help w/ your bowels Call with any questions or concerns Hang in there!

## 2010-08-20 NOTE — Progress Notes (Signed)
  Subjective:    Patient ID: Carol Quinn, female    DOB: [birth year], 55 y.o.   MRN: [id number]  HPI Hypersomnia- pt carries this diagnosis but reports she continues to have 'excessive fatigue'.    'place on leg'- R leg, slowly enlarging over 2 yrs.  Flaky, dry, occasionally itchy.  Red.  Sees Dr Mayford Knife at [facility].  Chronic dryness- has been dx'd by eye doctor w/ chronic dry eye, by dentist w/ chronic dry mouth.  Ongoing dry skin.  Wants to know if there is a cause.  Older sister has lupus.  Has associated joint pains- shoulders, back, and neck.  IBS- pt reports she has had intermittent loose stools x4 yrs.  Has had colonoscopy and endoscopy w/out results.  Will have up to 6 stools/day.  Rarely has constipation.  Pt admits to handling stress poorly.   Review of Systems For ROS see HPI     Objective:   Physical Exam  Vitals reviewed. Constitutional: She is oriented to person, place, and time. She appears well-developed and well-nourished. No distress.  HENT:  Head: Normocephalic and atraumatic.  Eyes: Conjunctivae and EOM are normal. Pupils are equal, round, and reactive to light.  Neck: Normal range of motion. Neck supple. No thyromegaly present.  Cardiovascular: Normal rate, regular rhythm, normal heart sounds and intact distal pulses.   No murmur heard. Pulmonary/Chest: Effort normal and breath sounds normal. No respiratory distress.  Abdominal: Soft. Bowel sounds are normal. She exhibits no distension. There is no tenderness. There is no rebound and no guarding.  Musculoskeletal: She exhibits no edema.  Lymphadenopathy:    She has no cervical adenopathy.  Neurological: She is alert and oriented to person, place, and time.  Skin: Skin is warm and dry.       1 cm lesion on R lower leg consistent w/ keratosis vs squamous cell  Psychiatric: She has a normal mood and affect. Her behavior is normal.          Assessment & Plan:

## 2010-08-23 LAB — ANA: Anti Nuclear Antibody(ANA): NEGATIVE

## 2010-08-23 LAB — SJOGRENS SYNDROME-B EXTRACTABLE NUCLEAR ANTIBODY: SSB (La) (ENA) Antibody, IgG: <1 AU/mL (ref <30)

## 2010-08-23 LAB — SJOGRENS SYNDROME-A EXTRACTABLE NUCLEAR ANTIBODY: SSA (Ro) (ENA) Antibody, IgG: <1 AU/mL (ref <30)

## 2010-08-24 ENCOUNTER — Telehealth: Payer: Self-pay

## 2010-08-24 NOTE — Telephone Encounter (Signed)
Message copied by Beverely Low on Tue Aug 24, 2010  9:10 AM ------      Message from: Sheliah Hatch      Created: Sun Aug 22, 2010 12:52 PM       Blood count, electrolytes and thyroid are all normal.  Waiting on the rheumatology labs.

## 2010-08-24 NOTE — Telephone Encounter (Signed)
Message copied by Beverely Low on Tue Aug 24, 2010  9:06 AM ------      Message from: Sheliah Hatch      Created: Mon Aug 23, 2010  3:01 PM       No evidence of rheumatologic cause for her dryness.

## 2010-08-24 NOTE — Telephone Encounter (Signed)
Labs mailed

## 2010-08-27 ENCOUNTER — Telehealth: Payer: Self-pay | Admitting: Family Medicine

## 2010-08-27 NOTE — Telephone Encounter (Signed)
Carpal tunnel sxs were not discussed at last visit- would need appt to discuss this  In regards to her fatigue- she has the dx of hypersomnolance- which means she will be excessively sleepy.  She is already on a stimulant.  If she continues to have problems, she needs to call her sleep doctor  In regards to the dryness- she can continue to use eye drops for symptoms relief and drink lots of water to improve her feelings of dry mouth.  At this time there is nothing else to do except tx her symptoms as they arise.

## 2010-08-27 NOTE — Telephone Encounter (Signed)
Spoke w/ pt wanted to know what the next step would be since labs where normal. Also mentioned that her left lower arm gets numb and tingly from time to time informed pt from what she is describing could be carpal tunnel says she does do a lot of with her hands for work. Would like to know if the Dr. would have any recommendations.

## 2010-08-27 NOTE — Telephone Encounter (Signed)
(  Note on file-ok to leave information on voicemail) Message left on voicemail with Dr.Tabori's response to patient's concerns. Patient to call the office for an appointment to further discuss CTS or if she has question about information given

## 2010-08-31 ENCOUNTER — Telehealth: Payer: Self-pay | Admitting: Family Medicine

## 2010-08-31 NOTE — Assessment & Plan Note (Signed)
Keratosis vs squamous cell.  Pt to call and set up appt w/ derm

## 2010-08-31 NOTE — Telephone Encounter (Signed)
one problem--tried OTC prod, not shrinking hemmorroid;  Requests that we call her with referral to another doctor who can take care of problem  --(woke up with hemmorroid on Saturday morning)

## 2010-08-31 NOTE — Assessment & Plan Note (Signed)
R/o sjogren's sxs given hx of dry eye and mouth.  Check labs.  Refer to rheum if appropriate.  Pt expressed understanding and is in agreement w/ plan.

## 2010-08-31 NOTE — Assessment & Plan Note (Signed)
Pt's bowel sxs consistent w/ IBS- diarrhea predominate.  Reviewed lifestyle modification and importance of stress management.  Will follow.

## 2010-08-31 NOTE — Assessment & Plan Note (Signed)
Pt carries dx of hypersomnia but wants to know why she remains so fatigued.  On nuvigil.  Check labs to r/o metabolic cause like thyroid.  Suspect this is just part of pt's dx and encouraged her to speak w/ her sleep specialist.

## 2010-09-01 MED ORDER — LOSARTAN POTASSIUM-HCTZ 100-25 MG PO TABS: 1.0000 | ORAL_TABLET | Freq: Every day | ORAL | Status: DC

## 2010-09-01 NOTE — Telephone Encounter (Signed)
Since pt doesn't need prior auth she can call [facility] directly and make an appt.  Since I have not evaluated her for this problem I would be unable to provide them w/ necessary info in a consult request so it would be better for her to call.

## 2010-09-01 NOTE — Telephone Encounter (Signed)
Spoke w/ pt says that hemorrhoid is still about and would like to be referred directly to a specialist instead of having to come to office says that he insurance doesn't require any authorization. Informed pt that I will forward to Dr. Beverely Low.

## 2010-09-02 NOTE — Telephone Encounter (Signed)
Spoke w/ pt informed of information.

## 2010-09-13 ENCOUNTER — Telehealth: Payer: Self-pay | Admitting: Internal Medicine

## 2010-09-13 MED ORDER — PREDNISONE (PAK) 10 MG PO TABS: ORAL_TABLET | ORAL | Status: AC

## 2010-09-13 MED ORDER — AZITHROMYCIN 250 MG PO TABS: ORAL_TABLET | ORAL | Status: AC

## 2010-09-13 NOTE — Telephone Encounter (Signed)
Worse x 4 days, increased cough green mucus, and short of breath but has not increased her rescue rx  Increase albuterol inhaler to every 4h zpack Prednisone 10 mg take  4 each am x 2 days,   2 each am x 2 days,  1 each am x2days and stop  F/u by end of week to er in meantime if worsen

## 2010-09-16 ENCOUNTER — Encounter: Payer: Self-pay | Admitting: Internal Medicine

## 2010-09-16 ENCOUNTER — Ambulatory Visit (INDEPENDENT_AMBULATORY_CARE_PROVIDER_SITE_OTHER): Payer: 59 | Admitting: Internal Medicine

## 2010-09-16 VITALS — BP 120/78 | HR 91 | Ht 62.0 in | Wt 143.8 lb

## 2010-09-16 DIAGNOSIS — J449 Chronic obstructive pulmonary disease, unspecified: Secondary | ICD-10-CM

## 2010-09-16 DIAGNOSIS — J209 Acute bronchitis, unspecified: Secondary | ICD-10-CM

## 2010-09-16 MED ORDER — HYDROCODONE-HOMATROPINE 5-1.5 MG/5ML PO SYRP: 5.0000 mL | ORAL_SOLUTION | Freq: Four times a day (QID) | ORAL | Status: AC | PRN

## 2010-09-16 MED ORDER — ALBUTEROL SULFATE (2.5 MG/3ML) 0.083% IN NEBU
2.5000 mg | INHALATION_SOLUTION | Freq: Once | RESPIRATORY_TRACT | Status: AC
  Administered 2010-09-16: 2.5 mg via RESPIRATORY_TRACT

## 2010-09-16 MED ORDER — AZITHROMYCIN 250 MG PO TABS: ORAL_TABLET | ORAL | Status: DC

## 2010-09-16 MED ORDER — METHYLPREDNISOLONE ACETATE 80 MG/ML IJ SUSP
80.0000 mg | Freq: Once | INTRAMUSCULAR | Status: AC
  Administered 2010-09-16: 80 mg via INTRAMUSCULAR

## 2010-09-16 NOTE — Patient Instructions (Signed)
Neb albut   Depo 80  Scripts- hydromet cough syrup  Script- extended course of zithromax

## 2010-09-16 NOTE — Assessment & Plan Note (Addendum)
She feels she does best with extended course of antibiotic. Consider Daliresp later. Discussed flu and pneumovax/ Plan extend azithromax, give depo 80 , neb, hydromet

## 2010-09-16 NOTE — Progress Notes (Signed)
Subjective:    Patient ID: Carol Quinn, female    DOB: [birth year], 55 y.o.   MRN: [id number]  HPI    Review of Systems     Objective:   Physical Exam        Assessment & Plan:   Subjective:    Patient ID: Carol Quinn, female    DOB: [birth year], 55 y.o.   MRN: [id number]  HPI  Subjective:    Patient ID: Carol Quinn, female    DOB: 15-Nov-1955, 55 y.o.   MRN: [id number]  HPI 77 yoF former smoker followed for COPD and hypersomnia. Last here Sept 19, when she had been sick after a cruise. She did gradually get better. Comes now- to f/u after MSLT done for assessment of her persistent c/o "tired". Today she also says she has been having some sore throat this week w/o fever. Some dry cough. Denies fever or nodes. Lab- MSLT 04/28/10- short at 4.1 minutes, SOREM 0, so nonspecific, c/w primary ideopathic hypersomnia.  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 Still has a sense of fatigue, feeling she can't put one foot in front of another,  and easy confusion. Caffeine has no effect. Thyroid has been checked a lot. Sister has lupus, but she has tested negative.  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 We have again discussed motivation and support for quitting . Acute visit- Called 09/13/10 - On call Dr. gave prednisone taper and Zpak for 4 day of hx cough, green sputum and SOB. 1 day fever. No better so far with this Rx. Using her inhaler more.   Review of Systems See HPI  Constitutional:   No weight loss, night sweats,  Fevers, chills, fatigue,  HEENT:   No headaches,  Difficulty swallowing,  Tooth/dental problems,                No sneezing, itching, ear ache, nasal congestion, post nasal drip,  CV:  No chest pain,   Orthopnea, PND, swelling in lower extremities, anasarca, dizziness, palpitations GI  No heartburn, indigestion, abdominal pain, nausea, vomiting, diarrhea, change in bowel habits, loss of appetite Resp: ,  + non-productive cough,  No coughing up of blood.  + change in color of mucus.  No wheezing.  Skin: no rash or lesions. GU: no dysuria, change in color of urine, no urgency or frequency.  No flank pain. MS:  No joint pain or swelling.  No decreased range of motion.  No back pain. Psych:  No change in mood or affect. No acute depression or anxiety.  No memory loss.      Objective:   Physical Exam General- Alert, Oriented, Affect-appropriate, Distress- none acute Skin- rash-none, lesions- none, excoriation- none Lymphadenopathy- none Head- atraumatic            Eyes- Gross vision intact, PERRLA, conjunctivae clear secretions            Ears- Hearing, canals-normal            Nose- Clear, no-Septal dev, mucus, polyps, erosion, perforation             Throat- Mallampati IV , +mucosa -red from throat lozenge , drainage- none, tonsils- atrophic Neck- flexible , trachea midline, no stridor , thyroid nl, carotid no bruit Chest -  symmetrical excursion , unlabored           Heart/CV- RRR , no murmur , no gallop  , no rub, nl s1 s2                           - JVD- none , edema- none, stasis changes- none, varices- none           Lung- clear to P&A, wheeze- none, +cough- single raspy coughing episode noted, dullness-none, rub- none           Chest wall-  Abd- tender-no, distended-no, bowel sounds-present, HSM- no Br/ Gen/ Rectal- Not done, not indicated Extrem- cyanosis- none, clubbing, none, atrophy- none, strength- nl Neuro- grossly intact to observation    Assessment & Plan:     Review of Systems     Objective:   Physical Exam        Assessment & Plan:

## 2010-09-19 ENCOUNTER — Encounter: Payer: Self-pay | Admitting: Internal Medicine

## 2010-09-19 NOTE — Assessment & Plan Note (Signed)
I placed emphasis on smoking cessation in our discussion again. Have offered smoking cessation programs and support.

## 2010-09-24 ENCOUNTER — Ambulatory Visit: Payer: 59 | Admitting: Internal Medicine

## 2010-09-27 ENCOUNTER — Telehealth: Payer: Self-pay | Admitting: Internal Medicine

## 2010-09-27 MED ORDER — LEVOFLOXACIN 500 MG PO TABS: 500.0000 mg | ORAL_TABLET | Freq: Every day | ORAL | Status: AC

## 2010-09-27 NOTE — Telephone Encounter (Signed)
Spoke with pt and notified of recs per CDY Rx was sent to pharm 

## 2010-09-27 NOTE — Telephone Encounter (Signed)
Spoke with Carol Quinn. Last seen on 09/16/10 by CDY. She states that her cough/congestion are not improving at all since last visit. She states cough is still prod with green sputum and chest is very congested. Prior to last visit she had taken zithromax, and at time of ov we extended this and gave depo 80 and rx for hydromet. She states could not take hydromet due to paranoia, and is taking robitussin dm now. Please advise, thanks! Allergies  Allergen Reactions  . Clarithromycin   . Codeine   . Hydromet     paranoia  . Penicillins

## 2010-09-27 NOTE — Telephone Encounter (Signed)
Per CY-okay to give Levaquin 500mg  #10 take 1 by mouth daily no refills.

## 2010-10-15 ENCOUNTER — Telehealth: Payer: Self-pay | Admitting: Internal Medicine

## 2010-10-15 NOTE — Telephone Encounter (Signed)
I spoke with pt and she states she is transferring jobs in 2 weeks to a new building. Pt states they do not have parking and she states she will have to walk "8" blocks to and from work to get to her car. She states Dr. Maple Hudson filled out a form for her to get a handicap placement card for her. Pt states she would like Dr. Maple Hudson to write a letter for her stating she needs to park closer to the building due to her health conditions. Please advise Dr. Maple Hudson. Thanks  Carver Fila, CMA

## 2010-10-20 NOTE — Telephone Encounter (Signed)
Dr. Maple Hudson, pls advise if you are ok with writing such letter for pt.  Thanks!

## 2010-11-03 NOTE — Telephone Encounter (Signed)
Would she just be candidate for [facility] parking?

## 2010-11-03 NOTE — Telephone Encounter (Signed)
Spoke with patient-she states her job needs a letter not just a HC placard; per patient DO NOT make too detailed as she doesn't feel her job should know her total health issues. I informed patient I would send this message to CY to address.    Pt also noted she is having a cough again-I suggested she use Delsym OTC cough syrup; if no relief then she can call our office back to get further suggestions from CY.

## 2010-11-04 NOTE — Telephone Encounter (Signed)
Note dictated 11/04/10-

## 2010-11-05 NOTE — Letter (Signed)
November 04, 2010   Gwenith Spitz. [place] [address] [place], [place] [address]  RE:  Letter of Medical Necessity  RE:  Carol Quinn, Carol Quinn MRN:  [id number]  /  DOB:  1955/03/19  To Whom It May Concern:  [name] is followed in this office for medical problems including respiratory limitation.  She is experiencing considerable difficulty having to walk the distance from her work place to her parking area.  It is medically advisable that she should be provided with a close parking space if available.   Sincerely,     Clinton D. Maple Hudson, MD, Tonny Bollman, FACP Electronically Signed   CDY/MedQ  DD: 11/04/2010  DT: 11/04/2010  Job #: [id number]

## 2010-11-08 NOTE — Telephone Encounter (Signed)
Carol Quinn -transcription- is sending letter to [place] for CY to sign-then will call patient to see if she would like to come pick up or have [place] mail to patient.

## 2010-11-09 NOTE — Telephone Encounter (Signed)
LMTCB-need to know if patient wants to come pick letter up or have it mailed to her.

## 2010-11-09 NOTE — Telephone Encounter (Signed)
[name] please advise, thanks.

## 2010-11-09 NOTE — Telephone Encounter (Signed)
Noted.  Nothing additional needed.

## 2010-11-09 NOTE — Telephone Encounter (Signed)
This has been signed and mailed to patient today.

## 2010-11-09 NOTE — Telephone Encounter (Signed)
Pt called Carol Quinn back & stated to just mail it to her home address.  Verified address.  Antionette Fairy

## 2010-11-22 ENCOUNTER — Telehealth: Payer: Self-pay | Admitting: Internal Medicine

## 2010-11-22 MED ORDER — TIOTROPIUM BROMIDE MONOHYDRATE 18 MCG IN CAPS: 18.0000 ug | ORAL_CAPSULE | Freq: Every day | RESPIRATORY_TRACT | Status: DC

## 2010-11-22 NOTE — Telephone Encounter (Signed)
Per CY-Ok to refill as requested.

## 2010-11-22 NOTE — Telephone Encounter (Signed)
Last Ov 09/16/2010. Last filled 07/26/2010. Pending OV 12/10/2010 with CDY. Pls advise on refills.

## 2010-11-23 MED ORDER — ARMODAFINIL 150 MG PO TABS: 150.0000 mg | ORAL_TABLET | Freq: Every day | ORAL | Status: DC

## 2010-12-10 ENCOUNTER — Ambulatory Visit: Payer: 59 | Admitting: Internal Medicine

## 2010-12-31 ENCOUNTER — Ambulatory Visit (INDEPENDENT_AMBULATORY_CARE_PROVIDER_SITE_OTHER): Payer: 59 | Admitting: Internal Medicine

## 2010-12-31 ENCOUNTER — Ambulatory Visit (INDEPENDENT_AMBULATORY_CARE_PROVIDER_SITE_OTHER)
Admission: RE | Admit: 2010-12-31 | Discharge: 2010-12-31 | Disposition: A | Discharge status: Home / Self Care | Payer: 59 | Source: Ambulatory Visit | Attending: Internal Medicine | Admitting: Internal Medicine

## 2010-12-31 ENCOUNTER — Encounter: Payer: Self-pay | Admitting: Internal Medicine

## 2010-12-31 VITALS — BP 120/74 | HR 73 | Ht 62.0 in | Wt 133.6 lb

## 2010-12-31 DIAGNOSIS — J40 Bronchitis, not specified as acute or chronic: Secondary | ICD-10-CM

## 2010-12-31 DIAGNOSIS — J449 Chronic obstructive pulmonary disease, unspecified: Secondary | ICD-10-CM

## 2010-12-31 DIAGNOSIS — F32A Depression, unspecified: Secondary | ICD-10-CM

## 2010-12-31 DIAGNOSIS — F329 Major depressive disorder, single episode, unspecified: Secondary | ICD-10-CM

## 2010-12-31 DIAGNOSIS — G4710 Hypersomnia, unspecified: Secondary | ICD-10-CM

## 2010-12-31 DIAGNOSIS — J209 Acute bronchitis, unspecified: Secondary | ICD-10-CM

## 2010-12-31 MED ORDER — ALBUTEROL SULFATE HFA 108 (90 BASE) MCG/ACT IN AERS: 2.0000 | INHALATION_SPRAY | Freq: Four times a day (QID) | RESPIRATORY_TRACT | Status: DC

## 2010-12-31 MED ORDER — BUDESONIDE-FORMOTEROL FUMARATE 160-4.5 MCG/ACT IN AERO: 2.0000 | INHALATION_SPRAY | Freq: Two times a day (BID) | RESPIRATORY_TRACT | Status: DC

## 2010-12-31 NOTE — Progress Notes (Signed)
Patient ID: Carol Quinn, female    DOB: 1955/08/22, 55 y.o.   MRN: [id number]  HPI 56 yoF former smoker followed for COPD and hypersomnia. Last here Sept 19, when she had been sick after a cruise. She did gradually get better. Comes now- to f/u after MSLT done for assessment of her persistent c/o "tired". Today she also says she has been having some sore throat this week w/o fever. Some dry cough. Denies fever or nodes. Lab- MSLT 04/28/10- short at 4.1 minutes, SOREM 0, so nonspecific, c/w primary ideopathic hypersomnia.  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 Still has a sense of fatigue, feeling she can't put one foot in front of another,  and easy confusion. Caffeine has no effect. Thyroid has been checked a lot. Sister has lupus, but she has tested negative.  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 We have again discussed motivation and support for quitting . Acute visit- Called 09/13/10 - On call Dr. gave prednisone taper and Zpak for 4 day of hx cough, green sputum and SOB. 1 day fever. No better so far with this Rx. Using her inhaler more.   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 Continues to smoke against advice. Hydromet cough syrup made her "paranoid"-she cried and screamed. 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 Nuvigil has allowed her to get up and get to work on a regular basis while sleeping soundly at night. If she skips a dose she expects to sleep all day. Her breathing has been stable with Symbicort. She rarely needs a rescue inhaler. She has had no acute respiratory illness  recently.  Review of Systems See HPI  Constitutional:   No weight loss, night sweats,  Fevers, chills, fatigue,  HEENT:   No headaches,  Difficulty swallowing,  Tooth/dental problems,                No sneezing, itching, ear ache, nasal congestion, post nasal drip,  CV:  No chest pain,  Orthopnea, PND, swelling in lower extremities, anasarca, dizziness, palpitations GI  No heartburn, indigestion, abdominal pain, nausea, vomiting, diarrhea, change in bowel habits, loss of appetite Resp: ,  + non-productive cough,  No coughing up of blood.  + change in color of mucus.  Some wheezing.  Skin: no rash or lesions. GU:  MS:  Psych:  No change in mood or affect. No acute depression or anxiety.  No memory loss.      Objective:   Physical Exam General- Alert, Oriented, Affect-appropriate, Distress- none acute, conversational, odor of tobacco Skin- rash-none, lesions- none, excoriation- none Lymphadenopathy- none Head- atraumatic            Eyes- Gross vision intact, PERRLA, conjunctivae clear secretions            Ears- Hearing, canals-normal            Nose- Clear, no-Septal dev, mucus, polyps, erosion, perforation             Throat- Mallampati IV , +mucosa -red from throat lozenge , drainage- none, tonsils- atrophic Neck- flexible , trachea midline, no stridor , thyroid nl, carotid no bruit Chest - symmetrical excursion ,  unlabored           Heart/CV- RRR , no murmur , no gallop  , no rub, nl s1 s2                           - JVD- none , edema- none, stasis changes- none, varices- none           Lung- clear to P&A, wheeze- none, +cough- single raspy coughing episode noted, dullness-none, rub- none           Chest wall-  Abd- tender-no, distended-no, bowel sounds-present, HSM- no Br/ Gen/ Rectal- Not done, not indicated Extrem- cyanosis- none, clubbing, none, atrophy- none, strength- nl Neuro- grossly intact to observation

## 2010-12-31 NOTE — Patient Instructions (Signed)
Order- CXR-  Dx bronchitis  Script- increase Symbicort to the 160 strength. Rinse mouth well after each use.   Call for scripts as needed  Please keep trying to stop smoking

## 2011-01-03 NOTE — Progress Notes (Signed)
Quick Note:  Spoke with pt and notified of results per Dr. Young. Pt verbalized understanding and denied any questions.  ______ 

## 2011-01-05 NOTE — Assessment & Plan Note (Addendum)
Intolerance to Hydromet cough syrup with mental status change reflects her underlying vulnerability. Plan-update chest x-ray. Increase Symbicort to 160

## 2011-01-05 NOTE — Assessment & Plan Note (Signed)
I suggested she talk with her primary physician about counseling.

## 2011-01-05 NOTE — Assessment & Plan Note (Signed)
She is aware of some wheeze and some dyspnea with unusual exertion. These give more lab recheck when I tried to discuss smoking cessation with her but she is not yet prepared to quit.

## 2011-01-05 NOTE — Assessment & Plan Note (Signed)
Appropriate sleep hygiene and good response to [employer]

## 2011-01-17 ENCOUNTER — Other Ambulatory Visit: Payer: Self-pay | Admitting: Allergy

## 2011-01-17 MED ORDER — BUDESONIDE-FORMOTEROL FUMARATE 80-4.5 MCG/ACT IN AERO: 2.0000 | INHALATION_SPRAY | Freq: Two times a day (BID) | RESPIRATORY_TRACT | Status: DC

## 2011-01-17 NOTE — Telephone Encounter (Signed)
Pharmacy requested wrong doseage on symbicort Called an canceled symbicort 80/4 pt is on 160/4.5

## 2011-01-18 ENCOUNTER — Other Ambulatory Visit (HOSPITAL_COMMUNITY): Payer: Self-pay | Admitting: Obstetrics and Gynecology

## 2011-01-18 DIAGNOSIS — Z1231 Encounter for screening mammogram for malignant neoplasm of breast: Secondary | ICD-10-CM

## 2011-01-19 ENCOUNTER — Telehealth: Payer: Self-pay | Admitting: Internal Medicine

## 2011-01-19 MED ORDER — DOXYCYCLINE HYCLATE 100 MG PO TABS: 100.0000 mg | ORAL_TABLET | Freq: Two times a day (BID) | ORAL | Status: AC

## 2011-01-19 NOTE — Telephone Encounter (Signed)
Pt returned call.  Advised of CDY's recs.  Pt okay with this but is requesting a longer round of abx than 7 days > requesting 2-3 weeks.  Pt stated that when she is prescribed abx x1week she is always calling back for more b/c her symptoms are not resolved.  Pt is aware CDY is not in the office currently and is okay with waiting for his return.  Okay to leave detailed message on machine.  Dr Maple Hudson please advise, thanks.

## 2011-01-19 NOTE — Telephone Encounter (Signed)
Called spoke with patient who c/o prod cough with green/brown mucus, increased SOB, some wheezing and chest discomfort from the coughing x5days.  Has been using delsym and coricidin.  Denies fever, HA, nausea.  Last ov 12.21.12, upcoming 6.21.13.  [facility].  Dr Maple Hudson please advise, thanks.  Allergies  Allergen Reactions  . Clarithromycin   . Codeine   . Hydromet     paranoia  . Penicillins

## 2011-01-19 NOTE — Telephone Encounter (Signed)
Per CDY doxycyline 100mg  #8 take 2 today then 1 daily--o fills. If pt is not okay with this then will have to let Dr. Maple Hudson know when he returns tomorrow--  [facility] for pt to make aware

## 2011-01-19 NOTE — Telephone Encounter (Signed)
Per CY-okay to give Doxycycline 100 mg # 28 take 1 by mouth bid no refills.

## 2011-01-19 NOTE — Telephone Encounter (Signed)
Left message on voicemail that Rx has been sent and if any questions or concerns to call the office in the morning.

## 2011-01-19 NOTE — Telephone Encounter (Signed)
Pt stated that the cough medicine she was last given really made her sick.  Pt stated that if an appt is necessary, she will need to schedule something w/ after hours clinic.  Carol Quinn

## 2011-01-22 ENCOUNTER — Ambulatory Visit (INDEPENDENT_AMBULATORY_CARE_PROVIDER_SITE_OTHER): Payer: 59 | Admitting: Internal Medicine

## 2011-01-22 ENCOUNTER — Encounter: Payer: Self-pay | Admitting: Internal Medicine

## 2011-01-22 VITALS — BP 112/70 | HR 80 | Temp 97.9°F | Wt 130.0 lb

## 2011-01-22 DIAGNOSIS — J209 Acute bronchitis, unspecified: Secondary | ICD-10-CM

## 2011-01-22 MED ORDER — AZITHROMYCIN 250 MG PO TABS: ORAL_TABLET | ORAL | Status: AC

## 2011-01-22 MED ORDER — PREDNISONE 20 MG PO TABS: 20.0000 mg | ORAL_TABLET | Freq: Two times a day (BID) | ORAL | Status: AC

## 2011-01-22 NOTE — Patient Instructions (Addendum)
Consider  [facility]'s smoking cessation program @ www.[place].com or 7543822700.   Please think about quitting smoking. Review the risks we discussed. Please call 1-800-QUIT-NOW (610 372 3358) for free smoking cessation counseling.. Please increase Symbicort to 2 puffs every 12 hours until well.  If symptoms persist or progress; you will need a chest x-ray.

## 2011-01-22 NOTE — Progress Notes (Signed)
  Subjective:    Patient ID: Carol Quinn, female    DOB: June 12, 1955, 56 y.o.   MRN: [id number]  HPI Respiratory tract infection Onset/symptoms:late Dec as cough Exposures (illness/environmental/extrinsic):husband & co-workers Progression of symptoms: chest congestion with purulence Treatments/response:Coricidin, Delsym with partial benefit. 1/9 Doxycycline called in by Dr Maple Hudson Present symptoms: Fever/chills/sweats:no Frontal headache:no Facial pain:no Nasal purulence:no Sore throat:no Dental pain:no Lymphadenopathy:no Wheezing/shortness of breath:yes Cough/sputum/hemoptysis:thick sticky . Manson Passey- green  Pleuritic pain:no Associated extrinsic/allergic symptoms:itchy eyes/ sneezing:no Past medical history:  asthma &emphysema , sees Dr Maple Hudson. She uses Spiriva one inhalation daily and Symbicort one inhalation twice a day Smoking history:3-4 cig/ day   She states she's taken Zithromax in the past          Review of Systems     Objective:   Physical Exam General appearance:well nourished; no acute distress or increased work of breathing is present.  No  lymphadenopathy about the head, neck, or axilla noted.   Eyes: No conjunctival inflammation or lid edema is present.   Ears:  External ear exam shows no significant lesions or deformities.  Otoscopic examination reveals clear canals, tympanic membranes are intact bilaterally without bulging, retraction, inflammation or discharge. Hearing aids bilaterally  Nose:  External nasal examination shows no deformity or inflammation. Nasal mucosa are pink and moist without lesions or exudates. No septal dislocation or deviation.No obstruction to airflow.   Oral exam: Dental hygiene is good; lips and gums are healthy appearing.There is no oropharyngeal erythema or exudate noted.      Heart:  Normal rate and regular rhythm. S1 and S2 normal without gallop, murmur, click, rub S 4  Lungs: Breath sounds are generally decreased. With  exploration she has scattered low-grade musical rhonchi and wheezes diffusely. She has intermittent nonproductive cough.  Extremities:  No cyanosis, edema, or clubbing  noted    Skin: Warm & dry          Assessment & Plan:   #1 bronchitis, purulent, with a bronchospastic component. This is in the context of  history of emphysema and asthma. She continues to smoke.  The pathophysiology of nicotine addiction was discussed. The risk related to smoking was discussed. This includes recurrent bronchitis due to excess secretion production; 3 times increased heart attack or stroke ; & acceleration of emphysema progression.  Plan: See orders and recommendations.

## 2011-02-14 ENCOUNTER — Other Ambulatory Visit: Payer: Self-pay | Admitting: *Deleted

## 2011-02-15 ENCOUNTER — Telehealth: Payer: Self-pay | Admitting: *Deleted

## 2011-02-15 MED ORDER — OMEPRAZOLE 40 MG PO CPDR: 40.0000 mg | DELAYED_RELEASE_CAPSULE | Freq: Every day | ORAL | Status: DC

## 2011-02-15 NOTE — Telephone Encounter (Signed)
Pt left vm stating that the pharmacy has faxed a rx request for pt omeprazole with no contact made from our office noted, sent #30 with 6 refills to the pharmacy noted in chart [facility] [address], called pt to advise the medication has been sent, pt advised that she needs medication to be sent to [facility] instead, noted change and sent omeprazole via escribe for #30 with 6 refills, apologized to pt for inconvience. Pt understood

## 2011-02-15 NOTE — Telephone Encounter (Signed)
Noted medication sent via escribe and pt aware via phone call from this nurse on prior note

## 2011-02-17 ENCOUNTER — Ambulatory Visit (HOSPITAL_COMMUNITY)
Admission: RE | Admit: 2011-02-17 | Discharge: 2011-02-17 | Disposition: A | Discharge status: Home / Self Care | Payer: 59 | Source: Ambulatory Visit | Attending: Obstetrics and Gynecology | Admitting: Obstetrics and Gynecology

## 2011-02-17 DIAGNOSIS — Z1231 Encounter for screening mammogram for malignant neoplasm of breast: Secondary | ICD-10-CM

## 2011-03-01 ENCOUNTER — Telehealth: Payer: Self-pay | Admitting: Internal Medicine

## 2011-03-01 MED ORDER — BUDESONIDE-FORMOTEROL FUMARATE 160-4.5 MCG/ACT IN AERO: 2.0000 | INHALATION_SPRAY | Freq: Two times a day (BID) | RESPIRATORY_TRACT | Status: DC

## 2011-03-01 MED ORDER — ARMODAFINIL 250 MG PO TABS: 250.0000 mg | ORAL_TABLET | Freq: Every day | ORAL | Status: DC

## 2011-03-01 NOTE — Telephone Encounter (Signed)
Rx for nuvigil was called to pharm.

## 2011-03-01 NOTE — Telephone Encounter (Signed)
Pt is returning Lori's call & stated that the higher dosage for Symbicort is the correct dosage.  Carol Quinn

## 2011-03-01 NOTE — Telephone Encounter (Signed)
Symbicort 160 rx sent -- Dr. Maple Hudson, pls advise if ok for nuvigil.  Thanks!

## 2011-03-01 NOTE — Telephone Encounter (Signed)
Yes Nuvigil is okay to refill x 5.

## 2011-03-01 NOTE — Telephone Encounter (Signed)
Fax also received for refills on Nuvigil 250mg , #30. Last filled on 11/23/10. Last OV 12/31/10. Pls advise on sending refills.

## 2011-03-01 NOTE — Telephone Encounter (Signed)
Per CY-okay to refill Nuvigil 250mg  # 30 take 1 po qd prn with 5 refills and Symbicort 160/4.5 #1 2 puffs and Rinse BID

## 2011-03-01 NOTE — Telephone Encounter (Signed)
Faxed request for refills on Symbicort 80 received from [facility]. At pt's OV in Dec 2012, Dr. Maple Hudson had increased this to the Symbicort 160. Need to verify with the patient which dosage she is currently taking before sending refills to the pharmacy. LMOMTCB x 1. Pt will ask for Lawson Fiscal but if I am not avialable, please give to triage.

## 2011-03-09 ENCOUNTER — Ambulatory Visit (INDEPENDENT_AMBULATORY_CARE_PROVIDER_SITE_OTHER): Payer: 59 | Admitting: Family Medicine

## 2011-03-09 ENCOUNTER — Encounter: Payer: Self-pay | Admitting: Family Medicine

## 2011-03-09 VITALS — BP 120/75 | HR 66 | Temp 97.9°F | Ht 63.5 in | Wt 126.8 lb

## 2011-03-09 DIAGNOSIS — K529 Noninfective gastroenteritis and colitis, unspecified: Secondary | ICD-10-CM | POA: Insufficient documentation

## 2011-03-09 DIAGNOSIS — K5289 Other specified noninfective gastroenteritis and colitis: Secondary | ICD-10-CM

## 2011-03-09 MED ORDER — ONDANSETRON 4 MG PO TBDP: 4.0000 mg | ORAL_TABLET | Freq: Three times a day (TID) | ORAL | Status: AC | PRN

## 2011-03-09 NOTE — Assessment & Plan Note (Signed)
New.  sxs and PE consistent w/ viral illness.  Start Zofran prn.  Immodium prn.  Push fluids.  Reviewed supportive care and red flags that should prompt return.  Pt expressed understanding and is in agreement w/ plan.

## 2011-03-09 NOTE — Progress Notes (Signed)
  Subjective:    Patient ID: YUKO COVENTRY, female    DOB: Oct 22, 1955, 56 y.o.   MRN: [id number]  HPI Vomiting/diarrhea- sxs started on Sunday w/ diarrhea.  For last 2 days 'vomiting all day long'.  Drinking fluids but not eating.  Still having diarrhea.  Tm 100.  Stools are watery.  No unusual foods.  No blood in stool.  Has not tried OTC meds.  No one else w/ similar sxs.   Review of Systems For ROS see HPI     Objective:   Physical Exam  Constitutional: She is oriented to person, place, and time. She appears well-developed and well-nourished. No distress.  HENT:  Head: Normocephalic and atraumatic.       MMM  Neck: Neck supple.  Cardiovascular: Normal rate, regular rhythm and intact distal pulses.   Pulmonary/Chest: Effort normal and breath sounds normal. No respiratory distress. She has no wheezes. She has no rales.  Abdominal: Soft. She exhibits no distension. There is tenderness (diffuse, nonspecific tenderness). There is no rebound.       Hyperactive BS  Lymphadenopathy:    She has no cervical adenopathy.  Neurological: She is alert and oriented to person, place, and time.  Skin: Skin is warm and dry.          Assessment & Plan:

## 2011-03-09 NOTE — Patient Instructions (Signed)
This is a virus and will improve w/ time Use the Zofran for nausea and vomiting Take immodium as needed for diarrhea.  2 pills after your next loose stool and then wait at least 2 hrs before taking the next pill Drink plenty of fluids REST! Hang in there!!

## 2011-04-04 ENCOUNTER — Telehealth: Payer: Self-pay | Admitting: Family Medicine

## 2011-04-04 MED ORDER — LOSARTAN POTASSIUM-HCTZ 100-25 MG PO TABS: 1.0000 | ORAL_TABLET | Freq: Every day | ORAL | Status: DC

## 2011-04-04 NOTE — Telephone Encounter (Signed)
Refill: Losartan/hctz 100-25mg  tab #30. Take 1 tablet by mouth daily. Last fill 03-12-11

## 2011-04-04 NOTE — Telephone Encounter (Signed)
rx sent to pharmacy by e-script  

## 2011-04-06 ENCOUNTER — Telehealth: Payer: Self-pay | Admitting: Internal Medicine

## 2011-04-06 ENCOUNTER — Ambulatory Visit (INDEPENDENT_AMBULATORY_CARE_PROVIDER_SITE_OTHER): Payer: 59 | Admitting: Adult Health

## 2011-04-06 ENCOUNTER — Encounter: Payer: Self-pay | Admitting: Adult Health

## 2011-04-06 VITALS — BP 132/76 | HR 70 | Temp 97.3°F | Ht 62.0 in | Wt 127.0 lb

## 2011-04-06 DIAGNOSIS — J449 Chronic obstructive pulmonary disease, unspecified: Secondary | ICD-10-CM

## 2011-04-06 MED ORDER — CEFDINIR 300 MG PO CAPS: 300.0000 mg | ORAL_CAPSULE | Freq: Two times a day (BID) | ORAL | Status: DC

## 2011-04-06 MED ORDER — PREDNISONE 10 MG PO TABS: ORAL_TABLET | ORAL | Status: DC

## 2011-04-06 MED ORDER — BENZONATATE 200 MG PO CAPS: 200.0000 mg | ORAL_CAPSULE | Freq: Three times a day (TID) | ORAL | Status: AC | PRN

## 2011-04-06 MED ORDER — LEVALBUTEROL HCL 0.63 MG/3ML IN NEBU
0.6300 mg | INHALATION_SOLUTION | Freq: Once | RESPIRATORY_TRACT | Status: AC
  Administered 2011-04-06: 0.63 mg via RESPIRATORY_TRACT

## 2011-04-06 NOTE — Telephone Encounter (Signed)
Per Dr. Maple Hudson have pt see TP today. Per TP place pt at 10:45am slot. Appt set. Pt aware.  Carol Quinn, CMA

## 2011-04-06 NOTE — Assessment & Plan Note (Signed)
Flare  Albuterol neb in office x 1   Plan;  Omnicef 300mg  Twice daily  For 7 days -take with food Mucinex DM twice daily as needed for cough and congestion. Prednisone taper. Over the next week Fluids and rest. Tylenol as needed. Tessalon 3 times a day as needed for cough. Most important goal is to quit smoking. Please contact office for sooner follow up if symptoms do not improve or worsen or seek emergency care follow up Dr. Maple Hudson  As planned and As needed

## 2011-04-06 NOTE — Telephone Encounter (Signed)
I spoke with the pt and she is c/o increased SOB, especially when she lies on her left side, productive cough with brown phlegm, chest tightness and wheezing all x 1 week. She states she has tried OTC cold medication without relief. She is requesting to be seen today. I offered her an appt tomorrow with CY at 1:30 but she does not feel like she can wait till tomorrow. She also does not want RX called in. Please advise. Carron Curie, CMA Allergies  Allergen Reactions  . Clarithromycin     Rash   . Codeine   . Hydromet     paranoia  . Penicillins

## 2011-04-06 NOTE — Patient Instructions (Signed)
Omnicef 300mg  Twice daily  For 7 days -take with food Mucinex DM twice daily as needed for cough and congestion. Prednisone taper. Over the next week Fluids and rest. Tylenol as needed. Tessalon 3 times a day as needed for cough. Most important goal is to quit smoking. Please contact office for sooner follow up if symptoms do not improve or worsen or seek emergency care follow up Dr. Maple Hudson  As planned and As needed

## 2011-04-06 NOTE — Progress Notes (Signed)
Addended by: Boone Master E on: 04/06/2011 05:37 PM   Modules accepted: Orders

## 2011-04-06 NOTE — Progress Notes (Signed)
Patient ID: Carol Quinn, female    DOB: 1955-10-04, 56 y.o.   MRN: [id number]  HPI 6 yoF former smoker followed for COPD and hypersomnia. Last here Sept 19, when she had been sick after a cruise. She did gradually get better. Comes now- to f/u after MSLT done for assessment of her persistent c/o "tired". Today she also says she has been having some sore throat this week w/o fever. Some dry cough. Denies fever or nodes. Lab- MSLT 04/28/10- short at 4.1 minutes, SOREM 0, so nonspecific, c/w primary ideopathic hypersomnia.  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 Still has a sense of fatigue, feeling she can't put one foot in front of another,  and easy confusion. Caffeine has no effect. Thyroid has been checked a lot. Sister has lupus, but she has tested negative.  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 We have again discussed motivation and support for quitting . Acute visit- Called 09/13/10 - On call Dr. gave prednisone taper and Zpak for 4 day of hx cough, green sputum and SOB. 1 day fever. No better so far with this Rx. Using her inhaler more.   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 Continues to smoke against advice. Hydromet cough syrup made her "paranoid"-she cried and screamed. 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 Nuvigil has allowed her to get up and get to work on a regular basis while sleeping soundly at night. If she skips a dose she expects to sleep all day. Her breathing has been stable with Symbicort. She rarely needs a rescue inhaler. She has had no acute respiratory illness  recently.  04/06/2011 Acute OV  Complains of prod cough with brown mucus, increased SOB, wheezing, tightness in chest x1.5week, progessively getting worse.  Has been using over-the-counter cough products, without any relief. Plays increased wheezing, and shortness of breath over last few days. We talked adeptly about smoking cessation. She denies any hemoptysis, chest pain, orthopnea, PND, or leg swelling.  Review of Systems  Constitutional:   No  weight loss, night sweats,  Fevers, chills, + fatigue, or  lassitude.  HEENT:   No headaches,  Difficulty swallowing,  Tooth/dental problems, or  Sore throat,                No sneezing, itching, ear ache,  +nasal congestion, post nasal drip,   CV:  No chest pain,  Orthopnea, PND, swelling in lower extremities, anasarca, dizziness, palpitations, syncope.   GI  No heartburn, indigestion, abdominal pain, nausea, vomiting, diarrhea, change in bowel habits, loss of appetite, bloody stools.   Resp:    No coughing up of blood.    No chest wall deformity  Skin: no rash or lesions.  GU: no dysuria, change in color of urine, no urgency or frequency.  No flank pain, no hematuria   MS:  No joint pain or swelling.  No decreased range of motion.  No back pain.  Psych:  No change in mood or affect. No depression or anxiety.  No memory loss.          Objective:   Physical Exam  GEN: A/Ox3; pleasant , NAD, well nourished  HEENT:  [place]/AT,  EACs-clear, TMs-wnl, NOSE-clear drainage , THROAT-clear, no lesions, no postnasal drip or exudate noted.   NECK:  Supple w/ fair ROM; no JVD; normal carotid impulses w/o bruits; no thyromegaly or nodules palpated; no lymphadenopathy.  RESP  Coarse BS w/ exp wheeze no accessory muscle use, no dullness to percussion  CARD:  RRR, no m/r/g  , no peripheral edema, pulses intact, no cyanosis or clubbing.  GI:   Soft & nt; nml bowel sounds; no organomegaly or masses detected.  Musco: Warm bil, no deformities or  joint swelling noted.   Neuro: alert, no focal deficits noted.    Skin: Warm, no lesions or rashes

## 2011-04-07 ENCOUNTER — Telehealth: Payer: Self-pay | Admitting: Adult Health

## 2011-04-07 NOTE — Telephone Encounter (Signed)
Spoke with pt. She states that her breathing has not improved since ov with TP 04/06/11 and she has had to use her rescue inhaler 3 times today already. She states that she is also producing green sputum. I advised that she is taking mucinex dm, and this helps to loosen the mucus and this is probably why coughing up more sputum. I advised that she should follow TP's instructions from ov, cont pred taper and abx and give this more time to work. I advised that should her symptoms persist or worsen to seek emergency care. Pt verbalized understanding. Will forward to CDY so he is aware.

## 2011-04-08 ENCOUNTER — Encounter (HOSPITAL_COMMUNITY): Payer: Self-pay

## 2011-04-08 ENCOUNTER — Inpatient Hospital Stay (HOSPITAL_COMMUNITY)
Admission: EM | Admit: 2011-04-08 | Discharge: 2011-04-10 | DRG: 190 | Disposition: A | Discharge status: Home / Self Care | Payer: 59 | Attending: Internal Medicine | Admitting: Internal Medicine

## 2011-04-08 ENCOUNTER — Other Ambulatory Visit: Payer: Self-pay

## 2011-04-08 ENCOUNTER — Emergency Department (HOSPITAL_COMMUNITY): Payer: 59

## 2011-04-08 DIAGNOSIS — G43909 Migraine, unspecified, not intractable, without status migrainosus: Secondary | ICD-10-CM | POA: Diagnosis not present

## 2011-04-08 DIAGNOSIS — J9620 Acute and chronic respiratory failure, unspecified whether with hypoxia or hypercapnia: Secondary | ICD-10-CM | POA: Diagnosis present

## 2011-04-08 DIAGNOSIS — G4710 Hypersomnia, unspecified: Secondary | ICD-10-CM

## 2011-04-08 DIAGNOSIS — J209 Acute bronchitis, unspecified: Principal | ICD-10-CM | POA: Diagnosis present

## 2011-04-08 DIAGNOSIS — J309 Allergic rhinitis, unspecified: Secondary | ICD-10-CM | POA: Diagnosis present

## 2011-04-08 DIAGNOSIS — J441 Chronic obstructive pulmonary disease with (acute) exacerbation: Secondary | ICD-10-CM | POA: Diagnosis present

## 2011-04-08 DIAGNOSIS — I10 Essential (primary) hypertension: Secondary | ICD-10-CM

## 2011-04-08 DIAGNOSIS — F17200 Nicotine dependence, unspecified, uncomplicated: Secondary | ICD-10-CM | POA: Diagnosis present

## 2011-04-08 DIAGNOSIS — F3289 Other specified depressive episodes: Secondary | ICD-10-CM | POA: Diagnosis present

## 2011-04-08 DIAGNOSIS — Z720 Tobacco use: Secondary | ICD-10-CM | POA: Diagnosis present

## 2011-04-08 DIAGNOSIS — F32A Depression, unspecified: Secondary | ICD-10-CM

## 2011-04-08 DIAGNOSIS — J440 Chronic obstructive pulmonary disease with acute lower respiratory infection: Principal | ICD-10-CM | POA: Diagnosis present

## 2011-04-08 DIAGNOSIS — R0902 Hypoxemia: Secondary | ICD-10-CM

## 2011-04-08 DIAGNOSIS — K219 Gastro-esophageal reflux disease without esophagitis: Secondary | ICD-10-CM | POA: Diagnosis present

## 2011-04-08 DIAGNOSIS — J449 Chronic obstructive pulmonary disease, unspecified: Secondary | ICD-10-CM

## 2011-04-08 DIAGNOSIS — G4700 Insomnia, unspecified: Secondary | ICD-10-CM | POA: Diagnosis present

## 2011-04-08 DIAGNOSIS — J40 Bronchitis, not specified as acute or chronic: Secondary | ICD-10-CM | POA: Diagnosis present

## 2011-04-08 DIAGNOSIS — F329 Major depressive disorder, single episode, unspecified: Secondary | ICD-10-CM | POA: Diagnosis present

## 2011-04-08 HISTORY — DX: Chronic obstructive pulmonary disease, unspecified: J44.9

## 2011-04-08 HISTORY — DX: Adverse effect of unspecified anesthetic, initial encounter: T41.45XA

## 2011-04-08 LAB — CBC
HCT: 48.2 % — ABNORMAL HIGH (ref 36.0–46.0)
Hemoglobin: 16.8 g/dL — ABNORMAL HIGH (ref 12.0–15.0)
MCV: 94.5 fL (ref 78.0–100.0)
Platelets: 266 10*3/uL (ref 150–400)
RBC: 5.1 MIL/uL (ref 3.87–5.11)
WBC: 8.9 10*3/uL (ref 4.0–10.5)

## 2011-04-08 LAB — BASIC METABOLIC PANEL
CO2: 30 mEq/L (ref 19–32)
Calcium: 10.1 mg/dL (ref 8.4–10.5)
Glucose, Bld: 133 mg/dL — ABNORMAL HIGH (ref 70–99)
Sodium: 138 mEq/L (ref 135–145)

## 2011-04-08 LAB — DIFFERENTIAL
Eosinophils Relative: 1 % (ref 0–5)
Lymphocytes Relative: 15 % (ref 12–46)
Lymphs Abs: 1.3 10*3/uL (ref 0.7–4.0)

## 2011-04-08 LAB — POCT I-STAT TROPONIN I

## 2011-04-08 MED ORDER — ESCITALOPRAM OXALATE 20 MG PO TABS
20.0000 mg | ORAL_TABLET | Freq: Every day | ORAL | Status: DC
  Administered 2011-04-09 – 2011-04-10 (×2): 20 mg via ORAL
  Filled 2011-04-08 (×2): qty 1

## 2011-04-08 MED ORDER — ALBUTEROL SULFATE (5 MG/ML) 0.5% IN NEBU
INHALATION_SOLUTION | RESPIRATORY_TRACT | Status: AC
  Filled 2011-04-08: qty 2

## 2011-04-08 MED ORDER — DEXTROSE 5 % IV SOLN: 1.0000 g | Freq: Once | INTRAVENOUS | Status: DC

## 2011-04-08 MED ORDER — METHYLPREDNISOLONE SODIUM SUCC 125 MG IJ SOLR
125.0000 mg | Freq: Once | INTRAMUSCULAR | Status: AC
  Administered 2011-04-08: 125 mg via INTRAVENOUS
  Filled 2011-04-08: qty 2

## 2011-04-08 MED ORDER — ALBUTEROL SULFATE (5 MG/ML) 0.5% IN NEBU
5.0000 mg | INHALATION_SOLUTION | Freq: Once | RESPIRATORY_TRACT | Status: AC
  Administered 2011-04-08: 5 mg via RESPIRATORY_TRACT
  Filled 2011-04-08: qty 1

## 2011-04-08 MED ORDER — IPRATROPIUM BROMIDE 0.02 % IN SOLN
0.5000 mg | RESPIRATORY_TRACT | Status: DC | PRN
  Filled 2011-04-08 (×3): qty 2.5

## 2011-04-08 MED ORDER — DEXTROSE 5 % IV SOLN: 500.0000 mg | Freq: Once | INTRAVENOUS | Status: DC

## 2011-04-08 MED ORDER — ALBUTEROL SULFATE (5 MG/ML) 0.5% IN NEBU
2.5000 mg | INHALATION_SOLUTION | RESPIRATORY_TRACT | Status: DC | PRN
  Filled 2011-04-08: qty 0.5

## 2011-04-08 MED ORDER — SODIUM CHLORIDE 0.9 % IJ SOLN
3.0000 mL | Freq: Two times a day (BID) | INTRAMUSCULAR | Status: DC
  Administered 2011-04-09 – 2011-04-10 (×2): 3 mL via INTRAVENOUS

## 2011-04-08 MED ORDER — ALBUTEROL (5 MG/ML) CONTINUOUS INHALATION SOLN
10.0000 mg/h | INHALATION_SOLUTION | Freq: Once | RESPIRATORY_TRACT | Status: AC
  Administered 2011-04-08: 10 mg/h via RESPIRATORY_TRACT

## 2011-04-08 MED ORDER — ONDANSETRON HCL 4 MG PO TABS: 4.0000 mg | ORAL_TABLET | Freq: Four times a day (QID) | ORAL | Status: DC | PRN

## 2011-04-08 MED ORDER — NICOTINE 14 MG/24HR TD PT24
14.0000 mg | MEDICATED_PATCH | Freq: Every day | TRANSDERMAL | Status: DC
  Administered 2011-04-08 – 2011-04-10 (×3): 14 mg via TRANSDERMAL
  Filled 2011-04-08 (×3): qty 1

## 2011-04-08 MED ORDER — POTASSIUM CHLORIDE CRYS ER 20 MEQ PO TBCR
40.0000 meq | EXTENDED_RELEASE_TABLET | Freq: Once | ORAL | Status: AC
  Administered 2011-04-08: 40 meq via ORAL
  Filled 2011-04-08: qty 1

## 2011-04-08 MED ORDER — METHYLPREDNISOLONE SODIUM SUCC 125 MG IJ SOLR
60.0000 mg | Freq: Four times a day (QID) | INTRAMUSCULAR | Status: DC
  Administered 2011-04-08 – 2011-04-10 (×7): 60 mg via INTRAVENOUS
  Filled 2011-04-08 (×10): qty 0.96

## 2011-04-08 MED ORDER — LOSARTAN POTASSIUM-HCTZ 100-25 MG PO TABS: 1.0000 | ORAL_TABLET | Freq: Every day | ORAL | Status: DC

## 2011-04-08 MED ORDER — SODIUM CHLORIDE 0.9 % IV SOLN: 250.0000 mL | INTRAVENOUS | Status: DC | PRN

## 2011-04-08 MED ORDER — ALBUTEROL SULFATE (5 MG/ML) 0.5% IN NEBU
2.5000 mg | INHALATION_SOLUTION | RESPIRATORY_TRACT | Status: DC
  Administered 2011-04-08: 5 mg via RESPIRATORY_TRACT
  Administered 2011-04-09 (×5): 2.5 mg via RESPIRATORY_TRACT
  Filled 2011-04-08 (×4): qty 0.5

## 2011-04-08 MED ORDER — MOXIFLOXACIN HCL IN NACL 400 MG/250ML IV SOLN
400.0000 mg | Freq: Once | INTRAVENOUS | Status: AC
  Administered 2011-04-08: 400 mg via INTRAVENOUS
  Filled 2011-04-08: qty 250

## 2011-04-08 MED ORDER — ENOXAPARIN SODIUM 40 MG/0.4ML ~~LOC~~ SOLN
40.0000 mg | Freq: Every day | SUBCUTANEOUS | Status: DC
  Administered 2011-04-08 – 2011-04-09 (×2): 40 mg via SUBCUTANEOUS
  Filled 2011-04-08 (×4): qty 0.4

## 2011-04-08 MED ORDER — HYDROCHLOROTHIAZIDE 25 MG PO TABS
25.0000 mg | ORAL_TABLET | Freq: Every day | ORAL | Status: DC
  Administered 2011-04-09 – 2011-04-10 (×2): 25 mg via ORAL
  Filled 2011-04-08 (×2): qty 1

## 2011-04-08 MED ORDER — SODIUM CHLORIDE 0.9 % IV SOLN
Freq: Once | INTRAVENOUS | Status: AC
  Administered 2011-04-08: 14:00:00 via INTRAVENOUS

## 2011-04-08 MED ORDER — ACETAMINOPHEN 650 MG RE SUPP: 650.0000 mg | Freq: Four times a day (QID) | RECTAL | Status: DC | PRN

## 2011-04-08 MED ORDER — LOSARTAN POTASSIUM 50 MG PO TABS
100.0000 mg | ORAL_TABLET | Freq: Every day | ORAL | Status: DC
  Administered 2011-04-09 – 2011-04-10 (×2): 100 mg via ORAL
  Filled 2011-04-08 (×2): qty 2

## 2011-04-08 MED ORDER — ALBUTEROL SULFATE (5 MG/ML) 0.5% IN NEBU
INHALATION_SOLUTION | RESPIRATORY_TRACT | Status: AC
  Administered 2011-04-08: 5 mg via RESPIRATORY_TRACT
  Filled 2011-04-08: qty 1

## 2011-04-08 MED ORDER — SODIUM CHLORIDE 0.9 % IJ SOLN: 3.0000 mL | INTRAMUSCULAR | Status: DC | PRN

## 2011-04-08 MED ORDER — IPRATROPIUM BROMIDE 0.02 % IN SOLN
RESPIRATORY_TRACT | Status: AC
  Administered 2011-04-08: 0.5 mg
  Filled 2011-04-08: qty 2.5

## 2011-04-08 MED ORDER — ACETAMINOPHEN 325 MG PO TABS
650.0000 mg | ORAL_TABLET | Freq: Four times a day (QID) | ORAL | Status: DC | PRN
  Administered 2011-04-09: 650 mg via ORAL
  Filled 2011-04-08: qty 2

## 2011-04-08 MED ORDER — ONDANSETRON HCL 4 MG/2ML IJ SOLN: 4.0000 mg | Freq: Four times a day (QID) | INTRAMUSCULAR | Status: DC | PRN

## 2011-04-08 MED ORDER — PENTOXIFYLLINE ER 400 MG PO TBCR
400.0000 mg | EXTENDED_RELEASE_TABLET | Freq: Two times a day (BID) | ORAL | Status: DC
  Administered 2011-04-08 – 2011-04-10 (×4): 400 mg via ORAL
  Filled 2011-04-08 (×5): qty 1

## 2011-04-08 MED ORDER — GUAIFENESIN ER 600 MG PO TB12
600.0000 mg | ORAL_TABLET | Freq: Two times a day (BID) | ORAL | Status: DC
  Administered 2011-04-08 – 2011-04-10 (×4): 600 mg via ORAL
  Filled 2011-04-08 (×5): qty 1

## 2011-04-08 MED ORDER — LORATADINE 10 MG PO TABS
10.0000 mg | ORAL_TABLET | Freq: Every day | ORAL | Status: DC
  Administered 2011-04-09 – 2011-04-10 (×2): 10 mg via ORAL
  Filled 2011-04-08 (×2): qty 1

## 2011-04-08 MED ORDER — ARMODAFINIL 250 MG PO TABS: 250.0000 mg | ORAL_TABLET | Freq: Every day | ORAL | Status: DC

## 2011-04-08 MED ORDER — IPRATROPIUM BROMIDE 0.02 % IN SOLN
0.5000 mg | RESPIRATORY_TRACT | Status: DC
  Administered 2011-04-09 (×5): 0.5 mg via RESPIRATORY_TRACT
  Filled 2011-04-08 (×2): qty 2.5

## 2011-04-08 MED ORDER — IPRATROPIUM BROMIDE 0.02 % IN SOLN
0.5000 mg | Freq: Once | RESPIRATORY_TRACT | Status: AC
  Administered 2011-04-08: 0.5 mg via RESPIRATORY_TRACT
  Filled 2011-04-08: qty 2.5

## 2011-04-08 MED ORDER — MOXIFLOXACIN HCL IN NACL 400 MG/250ML IV SOLN
400.0000 mg | INTRAVENOUS | Status: DC
  Administered 2011-04-09: 400 mg via INTRAVENOUS
  Filled 2011-04-08 (×2): qty 250

## 2011-04-08 MED ORDER — PANTOPRAZOLE SODIUM 40 MG PO TBEC
40.0000 mg | DELAYED_RELEASE_TABLET | Freq: Every day | ORAL | Status: DC
  Administered 2011-04-09 – 2011-04-10 (×2): 40 mg via ORAL
  Filled 2011-04-08 (×2): qty 1

## 2011-04-08 NOTE — ED Notes (Signed)
Baseline labs have been obtained

## 2011-04-08 NOTE — ED Provider Notes (Addendum)
History     CSN: [id number]  Arrival date & time 04/08/11  1223   First MD Initiated Contact with Patient 04/08/11 1234      Chief Complaint  Patient presents with  . Shortness of Breath    (Consider location/radiation/quality/duration/timing/severity/associated sxs/prior treatment) Patient is a 56 y.o. female presenting with shortness of breath. The history is provided by the patient.  Shortness of Breath  The current episode started more than 1 week ago. The onset was gradual. Associated symptoms include cough, shortness of breath and wheezing. Pertinent negatives include no chest pain and no fever.  Pt states she has COPD. States has had problems with recurrent exacerbations and cough since fall, on and off. States this time, she devloped increased cough about a week and a half ago. States she was coughing up brown sputum. She went to see her doctor, who started her on tessalon perls, omnicef, and 40mg  prednisone daily. States she is feeling worse, and has been taking medications  For 2 days, States was told to come here. Pt denies chest pain. She is a smoker. States wheezing and hard time talking and moving air. Taking albuterol and symicort inhalers at home. She denies any fever. Admits to chills. No nausea/vomiting, diarrhea.  Past Medical History  Diagnosis Date  . Childhood asthma   . GERD (gastroesophageal reflux disease)   . Allergic rhinitis   . Hypertension   . RBBB (right bundle branch block with left anterior fascicular block)   . Exposure to TB     uncle-her PPD neg  . Chronic headaches   . Acute bronchitis   . COPD (chronic obstructive pulmonary disease)     Past Surgical History  Procedure Date  . Total abdominal hysterectomy   . Tubal ligation   . Breast biopsy 2002    Family History  Problem Relation Age of Onset  . Emphysema Mother   . Heart attack Mother   . Heart failure Father   . Asthma Sister   . Asthma Maternal Uncle   . Arthritis Maternal  Grandmother   . Cancer Maternal Grandmother   . Cancer Sister   . Cancer      aunts  . Cancer      uncles    History  Substance Use Topics  . Smoking status: Current Everyday Smoker -- 0.8 packs/day    Types: Cigarettes    Last Attempt to Quit: 02/11/2007  . Smokeless tobacco: Not on file  . Alcohol Use: No    OB History    Grav Para Term Preterm Abortions TAB SAB Ect Mult Living                  Review of Systems  Constitutional: Positive for chills and fatigue. Negative for fever.  HENT: Negative.   Eyes: Negative.   Respiratory: Positive for cough, chest tightness, shortness of breath and wheezing.   Cardiovascular: Negative for chest pain, palpitations and leg swelling.  Gastrointestinal: Negative.   Genitourinary: Negative.   Musculoskeletal: Negative.   Skin: Negative.   Neurological: Negative.   Psychiatric/Behavioral: Negative.     Allergies  Clarithromycin; Codeine; Hydromet; and Penicillins  Home Medications   Current Outpatient Rx  Name Route Sig Dispense Refill  . ALBUTEROL SULFATE HFA 108 (90 BASE) MCG/ACT IN AERS Inhalation Inhale 2 puffs into the lungs 4 (four) times daily. 1 Inhaler prn  . ARMODAFINIL 250 MG PO TABS Oral Take 1 tablet (250 mg total) by mouth daily. 30 tablet 5  .  BENZONATATE 200 MG PO CAPS Oral Take 1 capsule (200 mg total) by mouth 3 (three) times daily as needed for cough. 30 capsule 1  . BUDESONIDE-FORMOTEROL FUMARATE 160-4.5 MCG/ACT IN AERO Inhalation Inhale 2 puffs into the lungs 2 (two) times daily. Rinse mouth 1 Inhaler 3  . CEFDINIR 300 MG PO CAPS Oral Take 1 capsule (300 mg total) by mouth 2 (two) times daily. 14 capsule 0  . ESCITALOPRAM OXALATE 20 MG PO TABS Oral Take 20 mg by mouth daily.      Marland Kitchen ESTROGENS CONJ SYNTHETIC B 0.625 MG PO TABS Oral Take 0.625 mg by mouth daily.      Marland Kitchen FEXOFENADINE HCL 180 MG PO TABS Oral Take 180 mg by mouth daily. As needed.     Marland Kitchen LOSARTAN POTASSIUM-HCTZ 100-25 MG PO TABS Oral Take 1 tablet  by mouth daily. 30 tablet 2  . OMEPRAZOLE 40 MG PO CPDR Oral Take 1 capsule (40 mg total) by mouth daily. 30 capsule 6  . PENTOXIFYLLINE ER 400 MG PO TBCR Oral Take 1 tablet by mouth Twice daily.    Marland Kitchen PREDNISONE 10 MG PO TABS  4 tabs for 2 days, then 3 tabs for 2 days, 2 tabs for 2 days, then 1 tab for 2 days, then stop 20 tablet 0  . SUMATRIPTAN SUCCINATE 100 MG PO TABS  Take as needed for migraines.     Marland Kitchen TIOTROPIUM BROMIDE MONOHYDRATE 18 MCG IN CAPS Inhalation Place 1 capsule (18 mcg total) into inhaler and inhale daily. 30 capsule 5  . ZOLMITRIPTAN 5 MG NA SOLN  Take as directed as needed for migraines     . ARMODAFINIL 150 MG PO TABS Oral Take 1 tablet (150 mg total) by mouth daily. 30 tablet 5  . ARMODAFINIL 250 MG PO TABS Oral Take 1 tablet (250 mg total) by mouth daily. 30 tablet 5  . ARMODAFINIL 150 MG PO TABS Oral Take 1 tablet (150 mg total) by mouth daily. 30 tablet 0    BP 125/79  Pulse 118  Temp(Src) 97.9 F (36.6 C) (Oral)  Resp 26  SpO2 91%  Physical Exam  Constitutional: She is oriented to person, place, and time. She appears well-developed and well-nourished. No distress.  Neck: Neck supple.  Cardiovascular: Regular rhythm and normal heart sounds.   No murmur heard.      tachycardic  Pulmonary/Chest: No respiratory distress. She has wheezes. She has no rales.       Tachypnec, wheezing expiratory in all lung fields  Abdominal: Soft. Bowel sounds are normal. She exhibits no distension. There is no tenderness.  Musculoskeletal: She exhibits no edema.  Neurological: She is alert and oriented to person, place, and time.  Skin: Skin is warm and dry.  Psychiatric: She has a normal mood and affect.    ED Course  Procedures (including critical care time)  Pt with COPD, increased shortness of breath, coughing. Will get labs, ECG, fluids, nebs. Her oxygen sat 91 at rest on RA.   Date: 04/08/2011  Rate: 104  Rhythm: sinus tachycardia  QRS Axis: normal  Intervals:  normal  ST/T Wave abnormalities: normal  Conduction Disutrbances:right bundle branch block and nonspecific intraventricular conduction delay  Narrative Interpretation:   Old EKG Reviewed: none available  Results for orders placed during the hospital encounter of 04/08/11  CBC      Component Value Range   WBC 8.9  4.0 - 10.5 (K/uL)   RBC 5.10  3.87 - 5.11 (MIL/uL)  Hemoglobin 16.8 (*) 12.0 - 15.0 (g/dL)   HCT 78.4 (*) 69.6 - 46.0 (%)   MCV 94.5  78.0 - 100.0 (fL)   MCH 32.9  26.0 - 34.0 (pg)   MCHC 34.9  30.0 - 36.0 (g/dL)   RDW 29.5  28.4 - 13.2 (%)   Platelets 266  150 - 400 (K/uL)  DIFFERENTIAL      Component Value Range   Neutrophils Relative 75  43 - 77 (%)   Neutro Abs 6.6  1.7 - 7.7 (K/uL)   Lymphocytes Relative 15  12 - 46 (%)   Lymphs Abs 1.3  0.7 - 4.0 (K/uL)   Monocytes Relative 9  3 - 12 (%)   Monocytes Absolute 0.8  0.1 - 1.0 (K/uL)   Eosinophils Relative 1  0 - 5 (%)   Eosinophils Absolute 0.1  0.0 - 0.7 (K/uL)   Basophils Relative 1  0 - 1 (%)   Basophils Absolute 0.0  0.0 - 0.1 (K/uL)  BASIC METABOLIC PANEL      Component Value Range   Sodium 138  135 - 145 (mEq/L)   Potassium 3.1 (*) 3.5 - 5.1 (mEq/L)   Chloride 96  96 - 112 (mEq/L)   CO2 30  19 - 32 (mEq/L)   Glucose, Bld 133 (*) 70 - 99 (mg/dL)   BUN 19  6 - 23 (mg/dL)   Creatinine, Ser 4.40 (*) 0.50 - 1.10 (mg/dL)   Calcium 10.2  8.4 - 10.5 (mg/dL)   GFR calc non Af Amer >90  >90 (mL/min)   GFR calc Af Amer >90  >90 (mL/min)  POCT I-STAT TROPONIN I      Component Value Range   Troponin i, poc 0.00  0.00 - 0.08 (ng/mL)   Comment 3            Dg Chest 2 View  04/08/2011  *RADIOLOGY REPORT*  Clinical Data: Short of breath.  Cough.  Wheezing.  Emphysema.  CHEST - 2 VIEW  Comparison: 12/31/2010.  Findings: Hyperinflation is present with flattening of hemidiaphragms and enlargement retrosternal clear space.  Emphysema is present.  Patient is rotated to the left.  Cardiopericardial silhouette appears  within normal limits.  There is no pneumothorax. No airspace disease.  No pleural effusion.  IMPRESSION: Emphysema without acute cardiopulmonary disease.  Original Report Authenticated By: Andreas Newport, M.D.    Pt feeling better with nebs and steroids. I have given her regular nebs and 1 hour long neb. I ambulated pt with pulse ox, and she dropped to 83% on RA. Pt will need admission for further treatment until she is able to maintain oxygen.   Spoke with [facility], will admit.   No diagnosis found.    MDM          Lottie Mussel, PA 04/08/11 2115  Lottie Mussel, PA 04/08/11 2115  Lottie Mussel, PA 06/10/11 [phone]

## 2011-04-08 NOTE — H&P (Signed)
PCP:   Neena Rhymes, MD, MD  Primary pulmonologist: Dr Jetty Duhamel  Chief Complaint:  Shortness of breath and productive cough.  HPI: This is a 56 year old female, with known history of COPD, HTN, GERD, migraine headaches, childhood bronchial asthma, chronic RBBB, allergic rhinitis, presenting with above symptoms. According to her, she became unwell about 1 week and a half ago,  ago, with progessive SOB, cough productive of brown phlegm, and wheeze and chest tightness, without chest pain, fever or chills. She was seen at her pulmonologist's office on 04/06/11, was precribed Omnicef, Prednisone and Tessalon, but despite compliance, symptoms have worsened and phlegm has turned greenish. She called Dr Roxy Cedar office on 04/07/11, and was advised to go to ED, if symptoms did not improve. Today, when she got out of bed in the morning, she was unable to ambulate to the bathroom, due to sever shortness of breath. It took her almost two hours too get dressed, then her spouse brought her to ED, where she was found to desaturate significantly on ambulation.   Allergies:   Allergies  Allergen Reactions  . Clarithromycin     Rash   . Codeine   . Hydromet     paranoia  . Penicillins       Past Medical History  Diagnosis Date  . Childhood asthma   . GERD (gastroesophageal reflux disease)   . Allergic rhinitis   . Hypertension   . RBBB (right bundle branch block with left anterior fascicular block)   . Exposure to TB     uncle-her PPD neg  . Chronic headaches   . Acute bronchitis   . Complication of anesthesia     woke up during surgery x 1  . Complication of anesthesia     hard to wake up x 1  . COPD (chronic obstructive pulmonary disease)     emphysema    Past Surgical History  Procedure Date  . Total abdominal hysterectomy   . Tubal ligation   . Breast biopsy 2002  . Nose surgery     "nasal deviation"    Prior to Admission medications   Medication Sig Start Date End Date  Taking? Authorizing Provider  albuterol (VENTOLIN HFA) 108 (90 BASE) MCG/ACT inhaler Inhale 2 puffs into the lungs 4 (four) times daily. 12/31/10 12/31/11 Yes Clinton D Young, MD  Armodafinil (NUVIGIL) 250 MG tablet Take 1 tablet (250 mg total) by mouth daily. 03/01/11  Yes Waymon Budge, MD  benzonatate (TESSALON) 200 MG capsule Take 1 capsule (200 mg total) by mouth 3 (three) times daily as needed for cough. 04/06/11 04/13/11 Yes [name] S Parrett, NP  budesonide-formoterol (SYMBICORT) 160-4.5 MCG/ACT inhaler Inhale 2 puffs into the lungs 2 (two) times daily. Rinse mouth 03/01/11 02/29/12 Yes Waymon Budge, MD  cefdinir (OMNICEF) 300 MG capsule Take 1 capsule (300 mg total) by mouth 2 (two) times daily. 04/06/11 04/16/11 Yes [name] S Parrett, NP  escitalopram (LEXAPRO) 20 MG tablet Take 20 mg by mouth daily.     Yes Historical Provider, MD  estrogens conjugated, synthetic B, (ENJUVIA) 0.625 MG tablet Take 0.625 mg by mouth daily.     Yes Historical Provider, MD  fexofenadine (ALLEGRA) 180 MG tablet Take 180 mg by mouth daily. As needed.    Yes Historical Provider, MD  losartan-hydrochlorothiazide (HYZAAR) 100-25 MG per tablet Take 1 tablet by mouth daily. 04/04/11  Yes Sheliah Hatch, MD  omeprazole (PRILOSEC) 40 MG capsule Take 1 capsule (40 mg total)  by mouth daily. 02/15/11  Yes Sheliah Hatch, MD  pentoxifylline (TRENTAL) 400 MG CR tablet Take 1 tablet by mouth Twice daily. 09/10/10  Yes Historical Provider, MD  predniSONE (DELTASONE) 10 MG tablet 4 tabs for 2 days, then 3 tabs for 2 days, 2 tabs for 2 days, then 1 tab for 2 days, then stop 04/06/11  Yes [name] S Parrett, NP  SUMAtriptan (IMITREX) 100 MG tablet Take as needed for migraines.    Yes Historical Provider, MD  tiotropium (SPIRIVA) 18 MCG inhalation capsule Place 1 capsule (18 mcg total) into inhaler and inhale daily. 11/22/10 11/22/11 Yes Clinton D Young, MD  zolmitriptan (ZOMIG) 5 MG nasal solution Take as directed as needed for migraines     Yes Historical Provider, MD  Armodafinil (NUVIGIL) 150 MG tablet Take 1 tablet (150 mg total) by mouth daily. 05/13/10 06/12/10  Waymon Budge, MD  Armodafinil (NUVIGIL) 250 MG tablet Take 1 tablet (250 mg total) by mouth daily. 08/17/10 09/16/10  Waymon Budge, MD  Armodafinil 150 MG tablet Take 1 tablet (150 mg total) by mouth daily. 11/23/10 12/23/10  Waymon Budge, MD    Social History: Married with one offspring, works as an Runner, broadcasting/film/video, smokes a half pack of cigarettes per day, since age 42 years. She has never used smokeless tobacco. She reports that she does not drink alcohol. Her drug history not on file.  Family History  Problem Relation Age of Onset  . Emphysema Mother   . Heart attack Mother   . Heart failure Father   . Asthma Sister   . Asthma Maternal Uncle   . Arthritis Maternal Grandmother   . Cancer Maternal Grandmother   . Cancer Sister   . Cancer      aunts  . Cancer      uncles    Review of Systems:  As per HPI and chief complaint. Patent denies fatigue, diminished appetite, weight loss, fever, chills, headache, blurred vision, difficulty in speaking, dysphagia, chest pain, orthopnea, paroxysmal nocturnal dyspnea, nausea, diaphoresis, abdominal pain, vomiting, diarrhea, although she has had a couple of loose stools, since she started antibiotics. No hematemesis, melena, dysuria, nocturia, urinary frequency, hematochezia, lower extremity swelling, pain, or redness. The rest of the systems review is negative.  Physical Exam:  General:  Patient does not appear to be in obvious acute distress, after hour-long bronchodilator nebulizer, administered in the ED. Alert, communicative, fully oriented, talking in complete sentences, not short of breath at rest.  HEENT:  No clinical pallor, no jaundice, no conjunctival injection or discharge. Hydration status is satisfactory. NECK:  Supple, JVP not seen, no carotid bruits, no palpable lymphadenopathy, no palpable  goiter. CHEST:  Distant bilateral expiratory wheezes, no crackles. HEART:  Sounds 1 and 2 heard, normal, regular, no murmurs. ABDOMEN:  Full, soft, non-tender, no palpable organomegaly, no palpable masses, normal bowel sounds. GENITALIA:  Not examined. LOWER EXTREMITIES:  No pitting edema, palpable peripheral pulses. MUSCULOSKELETAL SYSTEM:  Unremarkable. CENTRAL NERVOUS SYSTEM:  No focal neurologic deficit on gross examination.  Labs on Admission:  Results for orders placed during the hospital encounter of 04/08/11 (from the past 48 hour(s))  CBC     Status: Abnormal   Collection Time   04/08/11  1:13 PM      Component Value Range Comment   WBC 8.9  4.0 - 10.5 (K/uL)    RBC 5.10  3.87 - 5.11 (MIL/uL)    Hemoglobin 16.8 (*) 12.0 - 15.0 (g/dL)  HCT 48.2 (*) 36.0 - 46.0 (%)    MCV 94.5  78.0 - 100.0 (fL)    MCH 32.9  26.0 - 34.0 (pg)    MCHC 34.9  30.0 - 36.0 (g/dL)    RDW 16.1  09.6 - 04.5 (%)    Platelets 266  150 - 400 (K/uL)   DIFFERENTIAL     Status: Normal   Collection Time   04/08/11  1:13 PM      Component Value Range Comment   Neutrophils Relative 75  43 - 77 (%)    Neutro Abs 6.6  1.7 - 7.7 (K/uL)    Lymphocytes Relative 15  12 - 46 (%)    Lymphs Abs 1.3  0.7 - 4.0 (K/uL)    Monocytes Relative 9  3 - 12 (%)    Monocytes Absolute 0.8  0.1 - 1.0 (K/uL)    Eosinophils Relative 1  0 - 5 (%)    Eosinophils Absolute 0.1  0.0 - 0.7 (K/uL)    Basophils Relative 1  0 - 1 (%)    Basophils Absolute 0.0  0.0 - 0.1 (K/uL)   BASIC METABOLIC PANEL     Status: Abnormal   Collection Time   04/08/11  1:13 PM      Component Value Range Comment   Sodium 138  135 - 145 (mEq/L)    Potassium 3.1 (*) 3.5 - 5.1 (mEq/L)    Chloride 96  96 - 112 (mEq/L)    CO2 30  19 - 32 (mEq/L)    Glucose, Bld 133 (*) 70 - 99 (mg/dL)    BUN 19  6 - 23 (mg/dL)    Creatinine, Ser 4.09 (*) 0.50 - 1.10 (mg/dL)    Calcium 81.1  8.4 - 10.5 (mg/dL)    GFR calc non Af Amer >90  >90 (mL/min)    GFR calc Af  Amer >90  >90 (mL/min)   POCT I-STAT TROPONIN I     Status: Normal   Collection Time   04/08/11  2:14 PM      Component Value Range Comment   Troponin i, poc 0.00  0.00 - 0.08 (ng/mL)    Comment 3              Radiological Exams on Admission: *RADIOLOGY REPORT*  Clinical Data: Short of breath. Cough. Wheezing. Emphysema.  CHEST - 2 VIEW  Comparison: 12/31/2010.  Findings: Hyperinflation is present with flattening of hemidiaphragms and enlargement retrosternal clear space. Emphysema is present. Patient is rotated to the left. Cardiopericardial silhouette appears within normal limits. There is no pneumothorax. No airspace disease. No pleural effusion.  IMPRESSION: Emphysema without acute cardiopulmonary disease.  Original Report Authenticated By: Andreas Newport, M.D.   Assessment/Plan Principal Problem:  *COPD exacerbation: This is infective in nature, secondary to acute bronchitis. As patient has failed attempted outpatient treatment, she will be admitted, managed with bronchodilators, parenteral steroids, mucolytics and antibiotics. Active Problems:  1. Acute Bronchitis: This is the culprit for COPD exacerbation, and will be managed as above. We shall utilize Avelox, to cover community-acquired respiratory pathogens, and atypicals. Fortunately, CXR is devoid of pneumonic consolidation.  2. HTN (hypertension): Controlled. We shall continue management with pre-admission antihypertensives.  3. GERD (gastroesophageal reflux disease): This appears asymptomatic at this time, on PPI, which we shall continue.  4. Migraines: Not problematic at this time.  5. Tobacco abuse: Patient smokes a half pack of cigarettes per day. She has been counseled appropriately, and will be managed with  Nicoderm CQ patch.  Further management will depend on clinical course.   Time Spent on Admission: 45 mins.  Maksim Peregoy,CHRISTOPHER 04/08/2011, 7:12 PM

## 2011-04-08 NOTE — ED Notes (Signed)
Received report, pt. Resting in bed NAD noted

## 2011-04-08 NOTE — ED Notes (Signed)
RT CALLED FROM HR TX

## 2011-04-08 NOTE — ED Notes (Signed)
Pt. Transferred to floor via stretcher with RN and monitor, NAD noted, O2 3L N/C in place

## 2011-04-08 NOTE — ED Notes (Signed)
Pt walked 120 ft POx started at 96% on room air POx dropped to 84 placed in w/c and back to room  And placed on 3lnc encouraged prused lip breathing wit good sucess

## 2011-04-08 NOTE — ED Notes (Signed)
Pt been having SOB, was seem at PCP got albuterol inhaler, pt came today for worsening SOB and green sputum

## 2011-04-09 LAB — COMPREHENSIVE METABOLIC PANEL
Albumin: 3.1 g/dL — ABNORMAL LOW (ref 3.5–5.2)
BUN: 14 mg/dL (ref 6–23)
Calcium: 9.2 mg/dL (ref 8.4–10.5)
Creatinine, Ser: 0.44 mg/dL — ABNORMAL LOW (ref 0.50–1.10)
GFR calc Af Amer: >90 mL/min (ref >90)
Potassium: 3.9 mEq/L (ref 3.5–5.1)
Total Protein: 6.2 g/dL (ref 6.0–8.3)

## 2011-04-09 LAB — CBC
HCT: 41.9 % (ref 36.0–46.0)
Hemoglobin: 14.1 g/dL (ref 12.0–15.0)
MCV: 96.5 fL (ref 78.0–100.0)
RBC: 4.34 MIL/uL (ref 3.87–5.11)
WBC: 2.9 10*3/uL — ABNORMAL LOW (ref 4.0–10.5)

## 2011-04-09 MED ORDER — BUDESONIDE-FORMOTEROL FUMARATE 160-4.5 MCG/ACT IN AERO
2.0000 | INHALATION_SPRAY | Freq: Two times a day (BID) | RESPIRATORY_TRACT | Status: DC
  Administered 2011-04-09 – 2011-04-10 (×2): 2 via RESPIRATORY_TRACT
  Filled 2011-04-09: qty 6

## 2011-04-09 NOTE — Progress Notes (Signed)
Subjective: Feeling slightly better; denies CP. Patient afebrile.  Objective: Vital signs in last 24 hours: Temp:  [97.8 F (36.6 C)-98.5 F (36.9 C)] 97.8 F (36.6 C) (03/30 0559) Pulse Rate:  [73-104] 73  (03/30 0559) Resp:  [18-28] 18  (03/30 0559) BP: (105-144)/(56-72) 114/72 mmHg (03/30 0559) SpO2:  [97 %-100 %] 99 % (03/30 0849) Weight:  [57.607 kg (127 lb)] 57.607 kg (127 lb) (03/29 2300) Weight change:  Last BM Date: 04/08/11  Intake/Output from previous day: 03/29 0701 - 03/30 0700 In: 480 [P.O.:480] Out: -  Total I/O In: 120 [P.O.:120] Out: -    Physical Exam: General: Alert, awake, oriented x3, in no acute distress. HEENT: No bruits, no goiter. Heart: Regular rate and rhythm, without murmurs, rubs, gallops. Lungs: diffuse exp wheezing; decreased BS at bases bilaterally. Abdomen: Soft, nontender, nondistended, positive bowel sounds. Extremities: No clubbing, cyanosis or edema with positive pedal pulses. Neuro: Grossly intact, nonfocal.  Lab Results: Basic Metabolic Panel:  Basename 04/09/11 0342 04/08/11 1313  NA 139 138  K 3.9 3.1*  CL 106 96  CO2 23 30  GLUCOSE 127* 133*  BUN 14 19  CREATININE 0.44* 0.46*  CALCIUM 9.2 10.1  MG -- --  PHOS -- --   Liver Function Tests:  [facility] 04/09/11 0342  AST 13  ALT 10  ALKPHOS 68  BILITOT 0.1*  PROT 6.2  ALBUMIN 3.1*   CBC:  Basename 04/09/11 0342 04/08/11 1313  WBC 2.9* 8.9  NEUTROABS -- 6.6  HGB 14.1 16.8*  HCT 41.9 48.2*  MCV 96.5 94.5  PLT 250 266    Studies/Results: Dg Chest 2 View  04/08/2011  *RADIOLOGY REPORT*  Clinical Data: Short of breath.  Cough.  Wheezing.  Emphysema.  CHEST - 2 VIEW  Comparison: 12/31/2010.  Findings: Hyperinflation is present with flattening of hemidiaphragms and enlargement retrosternal clear space.  Emphysema is present.  Patient is rotated to the left.  Cardiopericardial silhouette appears within normal limits.  There is no pneumothorax. No airspace disease.   No pleural effusion.  IMPRESSION: Emphysema without acute cardiopulmonary disease.  Original Report Authenticated By: Andreas Newport, M.D.    Medications: Scheduled Meds:   . sodium chloride   Intravenous Once  . ipratropium  0.5 mg Nebulization Q4H   And  . albuterol  2.5 mg Nebulization Q4H  . albuterol  5 mg Nebulization Once  . albuterol  10 mg/hr Nebulization Once  . Armodafinil  250 mg Oral Daily  . enoxaparin  40 mg Subcutaneous QHS  . escitalopram  20 mg Oral Daily  . guaiFENesin  600 mg Oral BID  . losartan  100 mg Oral Daily   And  . hydrochlorothiazide  25 mg Oral Daily  . ipratropium      . ipratropium  0.5 mg Nebulization Once  . loratadine  10 mg Oral Daily  . methylPREDNISolone (SOLU-MEDROL) injection  125 mg Intravenous Once  . methylPREDNISolone (SOLU-MEDROL) injection  60 mg Intravenous Q6H  . moxifloxacin  400 mg Intravenous Once  . moxifloxacin  400 mg Intravenous Q24H  . nicotine  14 mg Transdermal Daily  . pantoprazole  40 mg Oral Q1200  . pentoxifylline  400 mg Oral BID  . potassium chloride  40 mEq Oral Once  . sodium chloride  3 mL Intravenous Q12H  . DISCONTD: azithromycin (ZITHROMAX) 500 MG IVPB  500 mg Intravenous Once  . DISCONTD: cefTRIAXone (ROCEPHIN)  IV  1 g Intravenous Once  . DISCONTD: losartan-hydrochlorothiazide  1 tablet  Oral Daily   Continuous Infusions:  PRN Meds:.sodium chloride, acetaminophen, acetaminophen, albuterol, ipratropium, ondansetron (ZOFRAN) IV, ondansetron, sodium chloride  Assessment/Plan: 1- acute on chronic resp failure: continue steroids, nebulizer and antibiotics. Will restart symbicort. Patient still wheezing and with decrease air movement.  2-Bronchitis: continue avelox.  3-HTN (hypertension): stable continue current regimen.  4-GERD (gastroesophageal reflux disease): continue PPI.  5-Depression: continue lexapro.  6-Migraines: no complaints of headaches; if needed will use triptans.  7-Tobacco abuse:  continue nicotine patch.  8-DVT: lovenox   LOS: 1 day   Sumit Branham [facility] [phone]  04/09/2011, 1:24 PM

## 2011-04-10 MED ORDER — PREDNISONE 20 MG PO TABS: ORAL_TABLET | ORAL | Status: DC

## 2011-04-10 MED ORDER — TIOTROPIUM BROMIDE MONOHYDRATE 18 MCG IN CAPS
18.0000 ug | ORAL_CAPSULE | Freq: Every day | RESPIRATORY_TRACT | Status: DC
  Administered 2011-04-10: 18 ug via RESPIRATORY_TRACT
  Filled 2011-04-10: qty 5

## 2011-04-10 MED ORDER — MOXIFLOXACIN HCL 400 MG PO TABS: 400.0000 mg | ORAL_TABLET | Freq: Every day | ORAL | Status: AC

## 2011-04-10 MED ORDER — ALBUTEROL SULFATE (5 MG/ML) 0.5% IN NEBU: 2.5000 mg | INHALATION_SOLUTION | RESPIRATORY_TRACT | Status: DC | PRN

## 2011-04-10 MED ORDER — ALBUTEROL SULFATE (5 MG/ML) 0.5% IN NEBU
2.5000 mg | INHALATION_SOLUTION | Freq: Four times a day (QID) | RESPIRATORY_TRACT | Status: DC
  Administered 2011-04-10 (×3): 2.5 mg via RESPIRATORY_TRACT
  Filled 2011-04-10 (×3): qty 0.5

## 2011-04-10 MED ORDER — NICOTINE 14 MG/24HR TD PT24: 1.0000 | MEDICATED_PATCH | Freq: Every day | TRANSDERMAL | Status: AC

## 2011-04-10 NOTE — Discharge Summary (Signed)
Physician Discharge Summary  Patient ID: Carol Quinn MRN: [id number] DOB/AGE: May 13, 1955 56 y.o.  Admit date: 04/08/2011 Discharge date: 04/10/2011  Primary Care Physician:  Carol Rhymes, MD, MD   Discharge Diagnoses:   1-COPD exacerbation (acute on chronic) 2-Bronchitis 3-GERD (gastroesophageal reflux disease) 4-Tobacco abuse 5-hyper-insomnia 6-depression 7-Allergic rhinitis 8-HTN 9-Migraines   Medication List  As of 04/10/2011  3:11 PM   STOP taking these medications         cefdinir 300 MG capsule         TAKE these medications         albuterol 108 (90 BASE) MCG/ACT inhaler   Commonly known as: PROVENTIL HFA;VENTOLIN HFA   Inhale 2 puffs into the lungs 4 (four) times daily.      Armodafinil 150 MG tablet   Take 1 tablet (150 mg total) by mouth daily.      Armodafinil 250 MG tablet   Take 1 tablet (250 mg total) by mouth daily.      Armodafinil 150 MG tablet   Take 1 tablet (150 mg total) by mouth daily.      Armodafinil 250 MG tablet   Take 1 tablet (250 mg total) by mouth daily.      benzonatate 200 MG capsule   Commonly known as: TESSALON   Take 1 capsule (200 mg total) by mouth 3 (three) times daily as needed for cough.      budesonide-formoterol 160-4.5 MCG/ACT inhaler   Commonly known as: SYMBICORT   Inhale 2 puffs into the lungs 2 (two) times daily. Rinse mouth      escitalopram 20 MG tablet   Commonly known as: LEXAPRO   Take 20 mg by mouth daily.      estrogens conjugated (synthetic B) 0.625 MG tablet   Commonly known as: ENJUVIA   Take 0.625 mg by mouth daily.      fexofenadine 180 MG tablet   Commonly known as: ALLEGRA   Take 180 mg by mouth daily. As needed.      losartan-hydrochlorothiazide 100-25 MG per tablet   Commonly known as: HYZAAR   Take 1 tablet by mouth daily.      moxifloxacin 400 MG tablet   Commonly known as: AVELOX   Take 1 tablet (400 mg total) by mouth daily.      nicotine 14 mg/24hr patch   Commonly  known as: NICODERM CQ - dosed in mg/24 hours   Place 1 patch onto the skin daily.      omeprazole 40 MG capsule   Commonly known as: PRILOSEC   Take 1 capsule (40 mg total) by mouth daily.      pentoxifylline 400 MG CR tablet   Commonly known as: TRENTAL   Take 1 tablet by mouth Twice daily.      predniSONE 20 MG tablet   Commonly known as: DELTASONE   4 tabs for 2 days, then 3 tabs for 2 days, then 2 tabs for 2 days, then 1 tab for 2 days, then 1/2 tab for 2 days and then stop.      SUMAtriptan 100 MG tablet   Commonly known as: IMITREX   Take as needed for migraines.      tiotropium 18 MCG inhalation capsule   Commonly known as: SPIRIVA   Place 1 capsule (18 mcg total) into inhaler and inhale daily.      zolmitriptan 5 MG nasal solution   Commonly known as: ZOMIG   Take as directed as needed  for migraines             Disposition and Follow-up:  Patient discharge in stable and improved condition. She is no longer requiring oxygen supplementation; no wheezing and with good O2 sat on RA at exertion and at rest. Patient advised to follow with PCP in 2 weeks and with pulmonology in 7-10 days. She will finish antibiotics and prednisone tapering as instructed. She was advised to stop smoking.   Consults:   None   Significant Diagnostic Studies:  Dg Chest 2 View  04/08/2011  *RADIOLOGY REPORT*  Clinical Data: Short of breath.  Cough.  Wheezing.  Emphysema.  CHEST - 2 VIEW  Comparison: 12/31/2010.  Findings: Hyperinflation is present with flattening of hemidiaphragms and enlargement retrosternal clear space.  Emphysema is present.  Patient is rotated to the left.  Cardiopericardial silhouette appears within normal limits.  There is no pneumothorax. No airspace disease.  No pleural effusion.  IMPRESSION: Emphysema without acute cardiopulmonary disease.  Original Report Authenticated By: Andreas Newport, M.D.    Brief H and P: 56 year old female, with known history of COPD, HTN,  GERD, migraine headaches, childhood bronchial asthma, chronic RBBB, allergic rhinitis, presenting with to ED with worsening SOB and wheezing. According to her, she became unwell about 1 week and a half ago, ago, with progessive SOB, cough productive of brown phlegm, and wheeze and chest tightness; initially treated as an outpatient but without significant response, reason while she decide to come to the hospital for further evaluation and treatment.   Hospital Course:  1- acute on chronic resp failure: Will continue prednisone tapering, inhalers, and antibiotics. Patient with good O2 sat and no longer wheezing. Good air movement bilaterally. Patient was advised to stop smoking and will arrange follow up with PCP and with pulmonology over the next 2 weeks.  2-Bronchitis: continue avelox and tessalon as needed to control symptoms.  3-HTN (hypertension): stable continue current regimen.   4-GERD (gastroesophageal reflux disease): continue PPI.   5-Depression: continue lexapro.   6-Migraines: no complaints of headaches; if needed will use triptans.   7-Tobacco abuse: counseling provided; prescription for outpatient nicotine patch given.  The rest of medical problems remains stable; no further changes made to her current regimen.   Time spent on Discharge: 40 minutes  Signed: [name] 04/10/2011, 3:11 PM

## 2011-04-10 NOTE — ED Provider Notes (Signed)
Medical screening examination/treatment/procedure(s) were conducted as a shared visit with non-physician practitioner(s) and myself.  I personally evaluated the patient during the encounter  Hx COPD, recently treated with prednisone and antibiotics.  Worsening SOB and cough.  CP with coughing.  Hypoxic on RA, poor air exchange with scattered wheezing.  No distress.  Glynn Octave, MD 04/10/11 1344

## 2011-04-10 NOTE — Discharge Instructions (Signed)
Chronic Obstructive Pulmonary Disease Exacerbation Chronic obstructive pulmonary disease (COPD) is a condition that limits airflow. COPD may include chronic bronchitis, pulmonary emphysema, or both. A COPD exacerbation means that your COPD has gotten worse. Without treatment, this can be a life-threatening problem. COPD exacerbation requires immediate medical care. CAUSES  COPD exacerbation can be caused by:  Exposure to smoke.   Exposure to air pollution, chemical fumes, or dust.   Respiratory infections.   Genetics, particularly alpha 1-antitrypsin deficiency.   A condition in which the body's immune system attacks itself (autoimmunity).  SYMPTOMS   Increased coughing.   Increased wheezing.   Increased shortness of breath.   Swelling due to a buildup of fluid (peripheral edema) related to heart strain.   Rapid breathing.   Chest enlargement (barrel chest).   Chest tightness.  DIAGNOSIS  There is no single test that can diagnosis COPD exacerbation. Your history, physical exam, and other tests will help your caregiver make a diagnosis. Tests may include a chest X-ray, pulmonary function tests, spirometry, basic lab tests, and an arterial blood gas test. TREATMENT  Severe problems may require a stay in the hospital. Depending on the cause of your problems, the following may be prescribed:  Antibiotic medicines.   Bronchodilators (inhaled or tablets).   Cortisone medicines (inhaled or tablets).   Supplemental oxygen therapy.   Pulmonary rehabilitation. This is a broad program that may involve exercise, nutrition counseling, breathing techniques, and further education about your condition.  It is important to use good technique with inhaled medicines. Spacer devices may be needed to help improve drug delivery. HOME CARE INSTRUCTIONS   Do not smoke. Quitting smoking is very important to prevent worsening of COPD.   Avoid exposure to all substances that irritate the airway,  especially tobacco smoke.   If prescribed, take your antibiotics as directed. Finish them even if you start to feel better.   Only take over-the-counter or prescription medicines as directed by your caregiver.   Drink enough fluids to keep your urine clear or pale yellow. This can help thin bronchial secretions.   Use a cool mist vaporizer. This makes it easier to clear your chest when you cough.   If you have a home nebulizer and oxygen, continue to use them as directed.   Maintain all necessary vaccinations to prevent infections.   Exercise regularly.   Eat a healthy diet.   Keep all follow-up appointments as directed by your caregiver.  SEEK IMMEDIATE MEDICAL CARE IF:  You have extreme shortness of breath.   You have severe chest pain or blood in your sputum.   You have a high fever, weakness, repeated vomiting, or fainting.   You feel confused.  MAKE SURE YOU:   Understand these instructions.   Will watch your condition.   Will get help right away if you are not doing well or get worse.  Document Released: 10/24/2006 Document Revised: 12/16/2010 Document Reviewed: 08/24/2010 Exit[employer] Patient Information 2012 [employer]. 

## 2011-04-12 ENCOUNTER — Telehealth: Payer: Self-pay | Admitting: Internal Medicine

## 2011-04-12 NOTE — Telephone Encounter (Signed)
Per CDY okay to make apt with TP  Pt is scheduled to come in 04/18/11 at 4:15 for HFU

## 2011-04-12 NOTE — Telephone Encounter (Signed)
Is poke with pt and she was d/c'd from teh hospital on 04/10/11 and told to f/u with CDY in 7-10 days. Nothing available. Please advise Dr. Maple Hudson, thanks

## 2011-04-15 ENCOUNTER — Encounter: Payer: Self-pay | Admitting: Adult Health

## 2011-04-15 ENCOUNTER — Ambulatory Visit (INDEPENDENT_AMBULATORY_CARE_PROVIDER_SITE_OTHER): Payer: 59 | Admitting: Adult Health

## 2011-04-15 VITALS — BP 128/76 | HR 78 | Temp 96.8°F | Ht 62.0 in | Wt 125.6 lb

## 2011-04-15 DIAGNOSIS — J441 Chronic obstructive pulmonary disease with (acute) exacerbation: Secondary | ICD-10-CM

## 2011-04-15 NOTE — Patient Instructions (Signed)
Finish Avelox and Prednisone .  Mucinex DM twice daily as needed for cough and congestion. Fluids and rest. Tylenol as needed. Most important goal is to quit smoking. Please contact office for sooner follow up if symptoms do not improve or worsen or seek emergency care follow up Dr. Maple Hudson  In 3 weeks and As needed

## 2011-04-15 NOTE — Progress Notes (Signed)
Patient ID: Carol Quinn, female    DOB: [birth year], 56 y.o.   MRN: [id number]  HPI 64 yoF former smoker followed for COPD and hypersomnia. Last here Sept 19, when she had been sick after a cruise. She did gradually get better. Comes now- to f/u after MSLT done for assessment of her persistent c/o "tired". Today she also says she has been having some sore throat this week w/o fever. Some dry cough. Denies fever or nodes. Lab- MSLT 04/28/10- short at 4.1 minutes, SOREM 0, so nonspecific, c/w primary ideopathic hypersomnia.  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 Still has a sense of fatigue, feeling she can't put one foot in front of another,  and easy confusion. Caffeine has no effect. Thyroid has been checked a lot. Sister has lupus, but she has tested negative.  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 We have again discussed motivation and support for quitting . Acute visit- Called 09/13/10 - On call Dr. gave prednisone taper and Zpak for 4 day of hx cough, green sputum and SOB. 1 day fever. No better so far with this Rx. Using her inhaler more.   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 Continues to smoke against advice. Hydromet cough syrup made her "paranoid"-she cried and screamed. 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 Nuvigil has allowed her to get up and get to work on a regular basis while sleeping soundly at night. If she skips a dose she expects to sleep all day. Her breathing has been stable with Symbicort. She rarely needs a rescue inhaler. She has had no acute respiratory illness  recently.  04/06/2011 Acute OV  Complains of prod cough with brown mucus, increased SOB, wheezing, tightness in chest x1.5week, progessively getting worse.  Has been using over-the-counter cough products, without any relief. Plays increased wheezing, and shortness of breath over last few days. We talked adeptly about smoking cessation. She denies any hemoptysis, chest pain, orthopnea, PND, or leg swelling. >>tx w/ Omnicef/Prednisone taper   04/15/2011 [facility]  Patient returns for a post hospitalization visit. Patient was admitted March 29- 04/10/2011 for a COPD exacerbation. Patient presented with progressively worsening cough, congestion, and wheezing. She was treated with IV antibiotics, steroids, and aggressive nebulizer treatments. X-ray showed no acute changes. Once again, she was recommended to quit smoking. She was discharged on Avelox and a prednisone taper. Since discharge. Patient says she is still feeling weak with no energy.  No smoking x 1 week. Has 2 days left on Avelox and Prednisone .  Discussed smoking cesstation.   Review of Systems  Constitutional:   No  weight loss, night sweats,  Fevers, chills, + fatigue, or  lassitude.  HEENT:   No headaches,  Difficulty swallowing,  Tooth/dental problems, or  Sore throat,                No sneezing, itching, ear ache,  +nasal congestion, post nasal drip,   CV:  No chest pain,  Orthopnea, PND, swelling in lower extremities, anasarca, dizziness, palpitations, syncope.   GI  No heartburn, indigestion, abdominal pain, nausea, vomiting, diarrhea, change in bowel habits, loss of appetite, bloody stools.  Resp:    No coughing up of blood.    No chest wall deformity  Skin: no rash or lesions.  GU: no dysuria, change in color of urine, no urgency or frequency.  No flank pain, no hematuria   MS:  No joint pain or swelling.  No decreased range of motion.  No back pain.  Psych:  No change in mood or affect. No depression or anxiety.   No memory loss.          Objective:   Physical Exam  GEN: A/Ox3; pleasant , NAD, well nourished   HEENT:  /AT,  EACs-clear, TMs-wnl, NOSE-clear drainage , THROAT-clear, no lesions, no postnasal drip or exudate noted.   NECK:  Supple w/ fair ROM; no JVD; normal carotid impulses w/o bruits; no thyromegaly or nodules palpated; no lymphadenopathy.  RESP  Coarse BS w/ no wheeze  no accessory muscle use, no dullness to percussion  CARD:  RRR, no m/r/g  , no peripheral edema, pulses intact, no cyanosis or clubbing.  GI:   Soft & nt; nml bowel sounds; no organomegaly or masses detected.  Musco: Warm bil, no deformities or joint swelling noted.   Neuro: alert, no focal deficits noted.    Skin: Warm, no lesions or rashes

## 2011-04-15 NOTE — Assessment & Plan Note (Signed)
Recent flare-resolving   Plan:  Finish Avelox and Prednisone .  Mucinex DM twice daily as needed for cough and congestion. Fluids and rest. Tylenol as needed. Most important goal is to quit smoking. Please contact office for sooner follow up if symptoms do not improve or worsen or seek emergency care follow up Dr. Maple Hudson  In 3 weeks and As needed

## 2011-04-18 ENCOUNTER — Inpatient Hospital Stay: Payer: 59 | Admitting: Adult Health

## 2011-05-06 ENCOUNTER — Encounter: Payer: Self-pay | Admitting: Internal Medicine

## 2011-05-06 ENCOUNTER — Ambulatory Visit (INDEPENDENT_AMBULATORY_CARE_PROVIDER_SITE_OTHER): Payer: 59 | Admitting: Internal Medicine

## 2011-05-06 VITALS — BP 118/66 | HR 91 | Ht 62.0 in | Wt 126.8 lb

## 2011-05-06 DIAGNOSIS — J449 Chronic obstructive pulmonary disease, unspecified: Secondary | ICD-10-CM

## 2011-05-06 DIAGNOSIS — G4710 Hypersomnia, unspecified: Secondary | ICD-10-CM

## 2011-05-06 DIAGNOSIS — F17200 Nicotine dependence, unspecified, uncomplicated: Secondary | ICD-10-CM

## 2011-05-06 DIAGNOSIS — J309 Allergic rhinitis, unspecified: Secondary | ICD-10-CM

## 2011-05-06 DIAGNOSIS — Z720 Tobacco use: Secondary | ICD-10-CM

## 2011-05-06 MED ORDER — ALBUTEROL SULFATE (2.5 MG/3ML) 0.083% IN NEBU: 2.5000 mg | INHALATION_SOLUTION | RESPIRATORY_TRACT | Status: DC | PRN

## 2011-05-06 MED ORDER — AEROCHAMBER MV MISC: Status: AC

## 2011-05-06 MED ORDER — COMPRESSOR/NEBULIZER MISC: 1.0000 | Freq: Once | Status: AC

## 2011-05-06 NOTE — Progress Notes (Signed)
Patient ID: Carol Quinn, female    DOB: 08-25-1955, 56 y.o.   MRN: [id number]  HPI 15 yoF former smoker followed for COPD and hypersomnia. Last here Sept 19, when she had been sick after a cruise. She did gradually get better. Comes now- to f/u after MSLT done for assessment of her persistent c/o "tired". Today she also says she has been having some sore throat this week w/o fever. Some dry cough. Denies fever or nodes. Lab- MSLT 04/28/10- short at 4.1 minutes, SOREM 0, so nonspecific, c/w primary ideopathic hypersomnia.  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 Still has a sense of fatigue, feeling she can't put one foot in front of another,  and easy confusion. Caffeine has no effect. Thyroid has been checked a lot. Sister has lupus, but she has tested negative.  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 We have again discussed motivation and support for quitting . Acute visit- Called 09/13/10 - On call Dr. gave prednisone taper and Zpak for 4 day of hx cough, green sputum and SOB. 1 day fever. No better so far with this Rx. Using her inhaler more.   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 Continues to smoke against advice. Hydromet cough syrup made her "paranoid"-she cried and screamed. 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 Nuvigil has allowed her to get up and get to work on a regular basis while sleeping soundly at night. If she skips a dose she expects to sleep all day. Her breathing has been stable with Symbicort. She rarely needs a rescue inhaler. She has had no acute respiratory illness  recently.  04/06/2011 Acute OV  Complains of prod cough with brown mucus, increased SOB, wheezing, tightness in chest x1.5week, progessively getting worse.  Has been using over-the-counter cough products, without any relief. Plays increased wheezing, and shortness of breath over last few days. We talked adeptly about smoking cessation. She denies any hemoptysis, chest pain, orthopnea, PND, or leg swelling. >>tx w/ Omnicef/Prednisone taper   04/15/2011 [facility]  Patient returns for a post hospitalization visit. Patient was admitted March 29- 04/10/2011 for a COPD exacerbation. Patient presented with progressively worsening cough, congestion, and wheezing. She was treated with IV antibiotics, steroids, and aggressive nebulizer treatments. X-ray showed no acute changes. Once again, she was recommended to quit smoking. She was discharged on Avelox and a prednisone taper. Since discharge. Patient says she is still feeling weak with no energy.  No smoking x 1 week. Has 2 days left on Avelox and Prednisone .  Discussed smoking cesstation.   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 Avelox helped. Back at work, off of prednisone and antibiotics with no cough. Smoking an electronic cigarette now. We discussed smoking cessation again. Complains she still feels very tired with limited exertion, ADLs and cleaning in the home.Ronne Binning does help  ROS-see HPI Constitutional:   No-   weight loss, night sweats, fevers, chills, fatigue, lassitude. HEENT:  No-  headaches, difficulty swallowing, tooth/dental problems, sore throat,       No-  sneezing, itching, ear ache, nasal congestion, post nasal drip,  CV:  No-   chest pain, orthopnea, PND, swelling in lower extremities, anasarca, dizziness,  palpitations Resp: No-   shortness of breath with exertion or at rest.              No-   productive cough,  No non-productive cough,  No- coughing up of blood.              No-   change in color of mucus.  No- wheezing.   Skin: No-   rash or lesions. GI:  No-   heartburn, indigestion, abdominal pain, nausea, vomiting,  GU: n. MS:  No-   joint pain or swelling.  . Neuro-     nothing unusual Psych:  No- change in mood or affect. No depression or anxiety.  No memory loss.  OBJ- Physical Exam General- Alert, Oriented, Affect-appropriate, Distress- none acute Skin- rash-none, lesions- none, excoriation- none Lymphadenopathy- none Head- atraumatic            Eyes- Gross vision intact, PERRLA, conjunctivae and secretions clear            Ears- Hearing, canals-normal            Nose- Clear, no-Septal dev, mucus, polyps, erosion, perforation             Throat- Mallampati II , mucosa clear , drainage- none, tonsils- atrophic Neck- flexible , trachea midline, no stridor , thyroid nl, carotid no bruit Chest - symmetrical excursion , unlabored           Heart/CV- RRR , no murmur , no gallop  , no rub, nl s1 s2                           - JVD- none , edema- none, stasis changes- none, varices- none           Lung- clear to P&A, wheeze- none, cough- none , dullness-none, rub- none           Chest wall-  Abd- tender-no, distended-no, bowel sounds-present, HSM- no Br/ Gen/ Rectal- Not done, not indicated Extrem- cyanosis- none, clubbing, none, atrophy- none, strength- nl Neuro- grossly intact to observation

## 2011-05-06 NOTE — Patient Instructions (Addendum)
Script for albuterol nebulizer solution and compressor nebulizer  For use if needed.  Script for spacer device to use with Symbicort

## 2011-05-11 NOTE — Assessment & Plan Note (Signed)
Controlled now

## 2011-05-11 NOTE — Assessment & Plan Note (Signed)
Nuvigil continue to help. Re-educated on issues of good sleep hygiene.

## 2011-05-11 NOTE — Assessment & Plan Note (Signed)
Not receptive, and I did talk again about available support for smoking cessation.

## 2011-05-11 NOTE — Assessment & Plan Note (Addendum)
Acute exacerbation requiring hospital stay in March. Now controlled after Avelox and prednisone. Plan spacer for her Symbicort, and addition of home nebulizer machine.

## 2011-06-10 NOTE — ED Provider Notes (Signed)
Medical screening examination/treatment/procedure(s) were performed by non-physician practitioner and as supervising physician I was immediately available for consultation/collaboration.   Glynn Octave, MD 06/10/11 1010

## 2011-06-14 ENCOUNTER — Telehealth: Payer: Self-pay | Admitting: Internal Medicine

## 2011-06-14 NOTE — Telephone Encounter (Signed)
lmomtcb x1 for pt 

## 2011-06-15 MED ORDER — CIPROFLOXACIN HCL 500 MG PO TABS: 500.0000 mg | ORAL_TABLET | Freq: Every day | ORAL | Status: AC

## 2011-06-15 NOTE — Telephone Encounter (Signed)
Please have patient come in on Tuesday 6-11-113 at 1015am. [facility]

## 2011-06-15 NOTE — Telephone Encounter (Signed)
I spoke with the pt and she is c/o chest congestion, productive cough with green phlegm, sinus congestion and pressure. Pt states she has been sick since October and the antibiotics that she has been given have not worked. She states the only thing that worked was IV antibiotics that she received when she was in the hospital. She does not want a zpak or doxycycline. Pt states she wants an RX called in or an appt. Please advise. Carron Curie, CMA Allergies  Allergen Reactions  . Clarithromycin     Rash   . Codeine   . Hydrocodone-Homatropine     paranoia  . Penicillins

## 2011-06-15 NOTE — Telephone Encounter (Signed)
Per CY-okay to give patient Cipro 500 mg #7 take 1 po qd no refills but also needs apt per CY at some point!

## 2011-06-15 NOTE — Telephone Encounter (Signed)
Please work her in with me when able. I will want to update labs and review status with her.

## 2011-06-15 NOTE — Telephone Encounter (Signed)
Patient returning call.  Now having increased sob and congestion. Cell# [phone]

## 2011-06-15 NOTE — Telephone Encounter (Signed)
Pt returned call.  I asked if she would be available on 06/21/11 at 10:15 am.  Pt answered no & feels that this is ridiculous.  Antionette Fairy

## 2011-06-15 NOTE — Telephone Encounter (Signed)
I spoke with pt and is aware of CDY recs. She voiced her understanding and rx has been sent to the pharmacy 

## 2011-06-15 NOTE — Telephone Encounter (Signed)
LMTCB

## 2011-06-16 ENCOUNTER — Telehealth: Payer: Self-pay | Admitting: Internal Medicine

## 2011-06-16 NOTE — Telephone Encounter (Signed)
Spoke with Pam at [facility]. She states needs dx code for the pt's albuterol nebs. I advised 496, she states that this was accepted and nothing further needed.

## 2011-07-01 ENCOUNTER — Ambulatory Visit: Payer: 59 | Admitting: Internal Medicine

## 2011-07-08 ENCOUNTER — Ambulatory Visit: Payer: 59 | Admitting: Internal Medicine

## 2011-07-13 ENCOUNTER — Telehealth: Payer: Self-pay | Admitting: Internal Medicine

## 2011-07-13 MED ORDER — TIOTROPIUM BROMIDE MONOHYDRATE 18 MCG IN CAPS: 18.0000 ug | ORAL_CAPSULE | Freq: Every day | RESPIRATORY_TRACT | Status: DC

## 2011-07-13 NOTE — Telephone Encounter (Signed)
Patient called back.  Informed patient rx has been sent to pharmacy.

## 2011-07-13 NOTE — Telephone Encounter (Signed)
RX has been sent to [facility] x1 for pt to make aware

## 2011-07-15 ENCOUNTER — Other Ambulatory Visit: Payer: Self-pay | Admitting: Family Medicine

## 2011-07-15 MED ORDER — LOSARTAN POTASSIUM-HCTZ 100-25 MG PO TABS: 1.0000 | ORAL_TABLET | Freq: Every day | ORAL | Status: DC

## 2011-07-15 NOTE — Telephone Encounter (Signed)
Refill done.  

## 2011-07-15 NOTE — Telephone Encounter (Signed)
refill losartan/hctz 100-25mg  tab #30, take one tablet by mouth once daily, last fill 6.1.13, last ov 2.27.13

## 2011-08-19 ENCOUNTER — Telehealth: Payer: Self-pay | Admitting: Family Medicine

## 2011-08-19 MED ORDER — ESCITALOPRAM OXALATE 20 MG PO TABS: 20.0000 mg | ORAL_TABLET | Freq: Every day | ORAL | Status: DC

## 2011-08-19 NOTE — Telephone Encounter (Signed)
Last OV 03-09-11 for acute, medication was noted on 3-13 by Laveda Norman MD discontinued on pt discharge, please note if you are treating pt for dx at this time or if refill needs to go elsewhere

## 2011-08-19 NOTE — Telephone Encounter (Signed)
Refill: escitalopram 20mg  tab #30. Take 1 tablet daily. Last fill 05-04-11

## 2011-08-19 NOTE — Telephone Encounter (Signed)
rx sent to pharmacy by e-script. Left vm for pt to call office to schedule f/u asap

## 2011-08-19 NOTE — Telephone Encounter (Signed)
Ok for #30, 3 refills.  Please have her set up f/u appt since I have not seen her recently.

## 2011-09-09 ENCOUNTER — Ambulatory Visit: Payer: 59 | Admitting: Internal Medicine

## 2011-09-19 ENCOUNTER — Other Ambulatory Visit: Payer: Self-pay | Admitting: Family Medicine

## 2011-09-19 NOTE — Telephone Encounter (Signed)
Refill Losartan Potassium-HCTZ (Tab) 100-25 MG Take 1 tablet by mouth daily, #30, last fill 8.6.13  Last ov 2.27.13 acute visit NOTE last telephone note dated 8.9.13 - stated pt needed f/u appt, nothing scheduled

## 2011-09-20 MED ORDER — LOSARTAN POTASSIUM-HCTZ 100-25 MG PO TABS: 1.0000 | ORAL_TABLET | Freq: Every day | ORAL | Status: DC

## 2011-09-20 NOTE — Telephone Encounter (Signed)
Refill done.  

## 2011-09-21 ENCOUNTER — Telehealth: Payer: Self-pay | Admitting: Internal Medicine

## 2011-09-21 NOTE — Telephone Encounter (Signed)
Pending appt 10/21/11.  Last seen 05/06/11. Nuvigil 250 mg #30, 1 po daily.  Pls advise on sending refills.

## 2011-09-22 MED ORDER — ARMODAFINIL 250 MG PO TABS: 250.0000 mg | ORAL_TABLET | Freq: Every day | ORAL | Status: DC

## 2011-09-22 NOTE — Telephone Encounter (Signed)
Per CY-okay to refill til next appointment (1 month).

## 2011-10-21 ENCOUNTER — Ambulatory Visit (INDEPENDENT_AMBULATORY_CARE_PROVIDER_SITE_OTHER): Payer: 59 | Admitting: Internal Medicine

## 2011-10-21 ENCOUNTER — Encounter: Payer: Self-pay | Admitting: Internal Medicine

## 2011-10-21 VITALS — BP 126/82 | HR 78 | Ht 62.0 in | Wt 117.6 lb

## 2011-10-21 DIAGNOSIS — J4489 Other specified chronic obstructive pulmonary disease: Secondary | ICD-10-CM

## 2011-10-21 DIAGNOSIS — F17200 Nicotine dependence, unspecified, uncomplicated: Secondary | ICD-10-CM

## 2011-10-21 DIAGNOSIS — Z720 Tobacco use: Secondary | ICD-10-CM

## 2011-10-21 DIAGNOSIS — J209 Acute bronchitis, unspecified: Secondary | ICD-10-CM

## 2011-10-21 DIAGNOSIS — Z23 Encounter for immunization: Secondary | ICD-10-CM

## 2011-10-21 DIAGNOSIS — J449 Chronic obstructive pulmonary disease, unspecified: Secondary | ICD-10-CM

## 2011-10-21 MED ORDER — CEFDINIR 300 MG PO CAPS: 300.0000 mg | ORAL_CAPSULE | Freq: Two times a day (BID) | ORAL | Status: DC

## 2011-10-21 NOTE — Patient Instructions (Addendum)
Flu vax  Script for cefdinir antibiotic to hold in case needed  You know you will feel better in the long run if you can get off the cigarettes. Some people are finding the electronic cigarettes work as well as the nicotine patches.

## 2011-10-21 NOTE — Progress Notes (Signed)
Patient ID: Carol Quinn, female    DOB: 02/27/1955, 56 y.o.   MRN: [id number]  HPI 69 yoF former smoker followed for COPD and hypersomnia. Last here Sept 19, when she had been sick after a cruise. She did gradually get better. Comes now- to f/u after MSLT done for assessment of her persistent c/o "tired". Today she also says she has been having some sore throat this week w/o fever. Some dry cough. Denies fever or nodes. Lab- MSLT 04/28/10- short at 4.1 minutes, SOREM 0, so nonspecific, c/w primary ideopathic hypersomnia.  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 Still has a sense of fatigue, feeling she can't put one foot in front of another,  and easy confusion. Caffeine has no effect. Thyroid has been checked a lot. Sister has lupus, but she has tested negative.  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 We have again discussed motivation and support for quitting . Acute visit- Called 09/13/10 - On call Dr. gave prednisone taper and Zpak for 4 day of hx cough, green sputum and SOB. 1 day fever. No better so far with this Rx. Using her inhaler more.   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 Continues to smoke against advice. Hydromet cough syrup made her "paranoid"-she cried and screamed. 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 Nuvigil has allowed her to get up and get to work on a regular basis while sleeping soundly at night. If she skips a dose she expects to sleep all day. Her breathing has been stable with Symbicort. She rarely needs a rescue inhaler. She has had no acute respiratory illness  recently.  04/06/2011 Acute OV  Complains of prod cough with brown mucus, increased SOB, wheezing, tightness in chest x1.5week, progessively getting worse.  Has been using over-the-counter cough products, without any relief. Plays increased wheezing, and shortness of breath over last few days. We talked adeptly about smoking cessation. She denies any hemoptysis, chest pain, orthopnea, PND, or leg swelling. >>tx w/ Omnicef/Prednisone taper   04/15/2011 [facility]  Patient returns for a post hospitalization visit. Patient was admitted March 29- 04/10/2011 for a COPD exacerbation. Patient presented with progressively worsening cough, congestion, and wheezing. She was treated with IV antibiotics, steroids, and aggressive nebulizer treatments. X-ray showed no acute changes. Once again, she was recommended to quit smoking. She was discharged on Avelox and a prednisone taper. Since discharge. Patient says she is still feeling weak with no energy.  No smoking x 1 week. Has 2 days left on Avelox and Prednisone .  Discussed smoking cesstation.   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 Avelox helped. Back at work, off of prednisone and antibiotics with no cough. Smoking an electronic cigarette now. We discussed smoking cessation again. 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 Chest tight, racing heart no increased use of bronchodilator. She continues Nuvigil "very helpful" for help in her stay awake at work. Trying to get adequate sleep at night. COPD assessment test  (CAT) score 32/40.   ROS-see HPI Constitutional:   No-   weight loss, night sweats, fevers, chills, fatigue, lassitude. HEENT:   No-  headaches, difficulty swallowing, tooth/dental problems, sore throat,       No-  sneezing, itching, ear ache, nasal congestion, post nasal drip,  CV:  No-   chest pain, orthopnea, PND, swelling in lower extremities, anasarca, dizziness, +palpitations Resp: + shortness of breath with exertion or at rest.              +   productive cough,  No non-productive cough,  No- coughing up of blood.              No-   change in color of mucus.  + wheezing.   Skin: No-   rash or lesions. GI:  No-   heartburn, indigestion, abdominal pain, nausea, vomiting,  GU: n. MS:  No-   joint pain or swelling.  . Neuro-     nothing unusual Psych:  No- change in mood or affect. No depression or anxiety.  No memory loss.  OBJ- Physical Exam General- Alert, Oriented, Affect-appropriate, Distress- none acute, conversational Skin- rash-none, lesions- none, excoriation- none Lymphadenopathy- none Head- atraumatic            Eyes- Gross vision intact, PERRLA, conjunctivae and secretions clear            Ears- Hearing, canals-normal            Nose- Clear, no-Septal dev, mucus, polyps, erosion, perforation             Throat- Mallampati II , mucosa clear , drainage- none, tonsils- atrophic Neck- flexible , trachea midline, no stridor , thyroid nl, carotid no bruit Chest - symmetrical excursion , unlabored           Heart/CV- RRR , no murmur , no gallop  , no rub, nl s1 s2. No extra beats.                           - JVD- none , edema- none, stasis changes- none, varices- none           Lung- clear to P&A, wheeze- none, cough- none , dullness-none, rub- none           Chest wall-  Abd-  Br/ Gen/ Rectal- Not done, not indicated Extrem- cyanosis- none, clubbing, none, atrophy- none, strength- nl Neuro- grossly intact to observation

## 2011-10-27 NOTE — Assessment & Plan Note (Signed)
We discussed fluids Mucinex and conservative therapy. If this is viral it will clear. If a bacterial infection is moving in and she will need antibiotic, we will give her one to hold.

## 2011-10-27 NOTE — Assessment & Plan Note (Signed)
Currently dealing with an acute exacerbation of COPD. Cefdinir to hold

## 2011-10-27 NOTE — Assessment & Plan Note (Signed)
She is not making any serious effort to stop smoking. I emphasized that her current acute bronchitis is going to become way she feels routinely.

## 2011-11-04 ENCOUNTER — Telehealth: Payer: Self-pay | Admitting: Internal Medicine

## 2011-11-04 NOTE — Telephone Encounter (Signed)
Last seen 10/21/11. Needing refills on Nuvigil 250 mg, #30, 1 po daily. Pls advise.

## 2011-11-04 NOTE — Telephone Encounter (Signed)
Rx called in to [facility]. Nuvigil 250mg  #30 with 5 refills

## 2012-01-02 ENCOUNTER — Telehealth: Payer: Self-pay | Admitting: Family Medicine

## 2012-01-02 DIAGNOSIS — I10 Essential (primary) hypertension: Secondary | ICD-10-CM

## 2012-01-02 MED ORDER — LOSARTAN POTASSIUM-HCTZ 100-25 MG PO TABS: 1.0000 | ORAL_TABLET | Freq: Every day | ORAL | Status: DC

## 2012-01-02 NOTE — Telephone Encounter (Signed)
refill Losartan Potassium-HCTZ (Tab) HYZAAR 100-25 MG Take 1 tablet by mouth daily. #30 last fill 11.25.13

## 2012-01-02 NOTE — Telephone Encounter (Signed)
Refill for hyzaar sent to pharmacy

## 2012-01-05 ENCOUNTER — Other Ambulatory Visit: Payer: Self-pay | Admitting: Internal Medicine

## 2012-01-05 MED ORDER — BUDESONIDE-FORMOTEROL FUMARATE 80-4.5 MCG/ACT IN AERO: 2.0000 | INHALATION_SPRAY | Freq: Two times a day (BID) | RESPIRATORY_TRACT | Status: DC

## 2012-01-05 NOTE — Telephone Encounter (Signed)
Rx sent 

## 2012-01-09 ENCOUNTER — Telehealth: Payer: Self-pay | Admitting: Internal Medicine

## 2012-01-09 MED ORDER — LEVOFLOXACIN 500 MG PO TABS: 500.0000 mg | ORAL_TABLET | Freq: Every day | ORAL | Status: DC

## 2012-01-09 NOTE — Telephone Encounter (Signed)
Called, spoke with pt who c/o "severe SOB" at rest and with exertion, wheezing, chest tightness, and feeling "extremely cold."  Also, reports cough but not coughing up a lot of mucus until today.  Mucus is brownish green and is "super thick."  Denies fever.  Reports symptoms started on Dec 25.  Has been taking albuterol neb with relief for a short period, coricidin, and delsym.  Requesting further recs.  Dr. Maple Hudson, pls advise.  Thank you.  Last OV with CDY 10/21/11 and asked to f/u in 6 months  [facility]  Allergies verified:  Allergies  Allergen Reactions  . Clarithromycin     Rash   . Codeine   . Hydrocodone-Homatropine     paranoia  . Penicillins

## 2012-01-09 NOTE — Telephone Encounter (Signed)
Suggest mucinex DM, levaquin 500 mg, # 7, 1 daily, no ref

## 2012-01-09 NOTE — Telephone Encounter (Signed)
Pt is aware of CY recommendations. Rx has been sent to [facility].

## 2012-01-16 ENCOUNTER — Encounter: Payer: Self-pay | Admitting: Adult Health

## 2012-01-16 ENCOUNTER — Telehealth: Payer: Self-pay | Admitting: Internal Medicine

## 2012-01-16 ENCOUNTER — Encounter: Payer: Self-pay | Admitting: *Deleted

## 2012-01-16 ENCOUNTER — Ambulatory Visit (INDEPENDENT_AMBULATORY_CARE_PROVIDER_SITE_OTHER): Payer: 59 | Admitting: Adult Health

## 2012-01-16 ENCOUNTER — Ambulatory Visit: Payer: 59 | Admitting: Adult Health

## 2012-01-16 VITALS — BP 130/80 | HR 87 | Temp 97.0°F | Ht 62.0 in | Wt 118.2 lb

## 2012-01-16 DIAGNOSIS — J449 Chronic obstructive pulmonary disease, unspecified: Secondary | ICD-10-CM

## 2012-01-16 DIAGNOSIS — J209 Acute bronchitis, unspecified: Secondary | ICD-10-CM

## 2012-01-16 MED ORDER — PREDNISONE 10 MG PO TABS: ORAL_TABLET | ORAL | Status: DC

## 2012-01-16 MED ORDER — LEVOFLOXACIN 500 MG PO TABS: 500.0000 mg | ORAL_TABLET | Freq: Every day | ORAL | Status: DC

## 2012-01-16 MED ORDER — METHYLPREDNISOLONE ACETATE 80 MG/ML IJ SUSP
80.0000 mg | Freq: Once | INTRAMUSCULAR | Status: AC
  Administered 2012-01-16: 80 mg via INTRAMUSCULAR

## 2012-01-16 NOTE — Patient Instructions (Addendum)
Extend Levaquin for additional 3 days  Prednisone taper over next week.  Fluids and rest  Please contact office for sooner follow up if symptoms do not improve or worsen or seek emergency care  follow up Dr. Maple Hudson  As planned and As needed   MUST QUIT SMOKING

## 2012-01-16 NOTE — Telephone Encounter (Signed)
Per CY - pt needs to come in and be seen. She has been scheduled for 2:30pm with TP. I advised that pt to try to get here as close to 2:30 as possible, she is aware that if she arrives more than 20 minutes late she might be seen. She verbalized understanding.

## 2012-01-16 NOTE — Telephone Encounter (Signed)
Last OV 10-21-11, COPD patient. I spoke with the pt and she states she has been sick since Dec 25th. She called in on 12-30 and was given levaquin 500 x 7 days. Pt states she does not feel any better. She states she is having in creased SOB, productive cough with green phlegm, weakness, no appetite, chest tightness and congestion, as well as sinus congestion. Pt states she has been using mucinex, as well as her inhaler and neb meds without relief. Pt requesting an appt is possible. Please advise. Carron Curie, CMA Allergies  Allergen Reactions  . Clarithromycin     Rash   . Codeine   . Hydrocodone-Homatropine     paranoia  . Penicillins

## 2012-01-20 NOTE — Assessment & Plan Note (Signed)
Slow to resolve exacerbation  Plan Extend Levaquin for additional 3 days  Prednisone taper over next week.  Fluids and rest  Please contact office for sooner follow up if symptoms do not improve or worsen or seek emergency care  follow up Dr. Maple Hudson  As planned and As needed   MUST QUIT SMOKING

## 2012-01-20 NOTE — Progress Notes (Signed)
Patient ID: Carol Quinn, female    DOB: 02/27/1955, 57 y.o.   MRN: [id number]  HPI 69 yoF former smoker followed for COPD and hypersomnia. Last here Sept 19, when she had been sick after a cruise. She did gradually get better. Comes now- to f/u after MSLT done for assessment of her persistent c/o "tired". Today she also says she has been having some sore throat this week w/o fever. Some dry cough. Denies fever or nodes. Lab- MSLT 04/28/10- short at 4.1 minutes, SOREM 0, so nonspecific, c/w primary ideopathic hypersomnia.  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 Still has a sense of fatigue, feeling she can't put one foot in front of another,  and easy confusion. Caffeine has no effect. Thyroid has been checked a lot. Sister has lupus, but she has tested negative.  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 We have again discussed motivation and support for quitting . Acute visit- Called 09/13/10 - On call Dr. gave prednisone taper and Zpak for 4 day of hx cough, green sputum and SOB. 1 day fever. No better so far with this Rx. Using her inhaler more.   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 Continues to smoke against advice. Hydromet cough syrup made her "paranoid"-she cried and screamed. 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 Nuvigil has allowed her to get up and get to work on a regular basis while sleeping soundly at night. If she skips a dose she expects to sleep all day. Her breathing has been stable with Symbicort. She rarely needs a rescue inhaler. She has had no acute respiratory illness  recently.  04/06/2011 Acute OV  Complains of prod cough with brown mucus, increased SOB, wheezing, tightness in chest x1.5week, progessively getting worse.  Has been using over-the-counter cough products, without any relief. Plays increased wheezing, and shortness of breath over last few days. We talked adeptly about smoking cessation. She denies any hemoptysis, chest pain, orthopnea, PND, or leg swelling. >>tx w/ Omnicef/Prednisone taper   04/15/2011 [facility]  Patient returns for a post hospitalization visit. Patient was admitted March 29- 04/10/2011 for a COPD exacerbation. Patient presented with progressively worsening cough, congestion, and wheezing. She was treated with IV antibiotics, steroids, and aggressive nebulizer treatments. X-ray showed no acute changes. Once again, she was recommended to quit smoking. She was discharged on Avelox and a prednisone taper. Since discharge. Patient says she is still feeling weak with no energy.  No smoking x 1 week. Has 2 days left on Avelox and Prednisone .  Discussed smoking cesstation.   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 Avelox helped. Back at work, off of prednisone and antibiotics with no cough. Smoking an electronic cigarette now. We discussed smoking cessation again. 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 Chest tight, racing heart no increased use of bronchodilator. She continues Nuvigil "very helpful" for help in her stay awake at work. Trying to get adequate sleep at night. COPD assessment test  (CAT) score 32/40.  01/16/11 Acute OV  Complains of prod cough with brown/green mucus, hoarseness, increased SOB, wheezing, tightness in chest, back pain, chills x2weeks.  finished levaquin yesterday.  Called in levaquin 500mg  x 7 days on 12/30 for above symptoms Some better but not gone completely , still has thick mucus , green at time.  Denies any hemoptysis, orthopnea, PND, or leg swelling   ROS-see HPI Constitutional:   No  weight loss, night sweats,  Fevers, chills,  +fatigue, or  lassitude.  HEENT:   No headaches,  Difficulty swallowing,  Tooth/dental problems, or  Sore throat,                No sneezing, itching, ear ache, + nasal congestion, post nasal drip,   CV:  No chest pain,  Orthopnea, PND, swelling in lower extremities, anasarca, dizziness, palpitations, syncope.   GI  No heartburn, indigestion, abdominal pain, nausea, vomiting, diarrhea, change in bowel habits, loss of appetite, bloody stools.   Resp:    No coughing up of blood.  Marland Kitchen  No chest wall deformity  Skin: no rash or lesions.  GU: no dysuria, change in color of urine, no urgency or frequency.  No flank pain, no hematuria   MS:  No joint pain or swelling.  No decreased range of motion.  No back pain.  Psych:  No change in mood or affect. No depression or anxiety.  No memory loss.      OBJ- Physical Exam GEN: A/Ox3; pleasant , NAD   HEENT:  [place]/AT,  EACs-clear, TMs-wnl, NOSE-clear, THROAT-clear, no lesions, no postnasal drip or exudate noted.   NECK:  Supple w/ fair ROM; no JVD; normal carotid impulses w/o bruits; no thyromegaly or nodules palpated; no lymphadenopathy.  RESP  Few rhonchi  wheezes/ rales/ or rhonchi.no accessory muscle use, no dullness to percussion  CARD:  RRR, no m/r/g  , no peripheral edema, pulses intact, no cyanosis or clubbing.  GI:   Soft & nt; nml bowel sounds; no organomegaly or masses detected.  Musco: Warm bil, no deformities or joint swelling noted.   Neuro: alert, no  focal deficits noted.    Skin: Warm, no lesions or rashes

## 2012-02-02 ENCOUNTER — Telehealth: Payer: Self-pay | Admitting: Internal Medicine

## 2012-02-02 MED ORDER — PREDNISONE 10 MG PO TABS: ORAL_TABLET | ORAL | Status: DC

## 2012-02-02 MED ORDER — LEVOFLOXACIN 500 MG PO TABS: 500.0000 mg | ORAL_TABLET | Freq: Every day | ORAL | Status: DC

## 2012-02-02 NOTE — Telephone Encounter (Signed)
i spoke with pt. Aware of recs. She is also requesting prednisone to be called in as well. Please advise thanks

## 2012-02-02 NOTE — Telephone Encounter (Signed)
Per CY-okay to give Prednisone 10mg #20 take 4 x 2 days, 3 x 2 days, 2 x 2 days, 1 x 2 days, then stop no refills.  

## 2012-02-02 NOTE — Telephone Encounter (Signed)
Saw TP 1.6.14: Patient Instructions     Extend Levaquin for additional 3 days  Prednisone taper over next week.  Fluids and rest  Please contact office for sooner follow up if symptoms do not improve or worsen or seek emergency care  follow up Dr. Maple Hudson As planned and As needed  MUST QUIT SMOKING    Dyspnea Wheezing Prod cough with green Tightness HA Head congestion, poss PND Denies f/c/s Not sure if these symptoms resolved from before. Feels that rounds of abx need to be longer, but the levaquin did help Nebs help Dr Maple Hudson please advise, thanks.  [facility] Allergies  Allergen Reactions  . Clarithromycin     Rash   . Codeine   . Hydrocodone-Homatropine     paranoia  . Penicillins

## 2012-02-02 NOTE — Telephone Encounter (Signed)
Pt aware RX has been sent for both rx's. Nothing further was needed

## 2012-02-02 NOTE — Telephone Encounter (Signed)
lmomtcb x1 on cell/home # regarding recs

## 2012-02-02 NOTE — Telephone Encounter (Signed)
Per CY-okay to give Levaquin 500 mg #7 take 1 po qd no refills.

## 2012-02-03 ENCOUNTER — Encounter: Payer: Self-pay | Admitting: *Deleted

## 2012-02-03 ENCOUNTER — Ambulatory Visit (INDEPENDENT_AMBULATORY_CARE_PROVIDER_SITE_OTHER): Payer: 59 | Admitting: Family Medicine

## 2012-02-03 VITALS — BP 110/62 | HR 78 | Wt 124.0 lb

## 2012-02-03 DIAGNOSIS — I10 Essential (primary) hypertension: Secondary | ICD-10-CM

## 2012-02-03 DIAGNOSIS — F329 Major depressive disorder, single episode, unspecified: Secondary | ICD-10-CM

## 2012-02-03 DIAGNOSIS — F32A Depression, unspecified: Secondary | ICD-10-CM

## 2012-02-03 DIAGNOSIS — R002 Palpitations: Secondary | ICD-10-CM

## 2012-02-03 LAB — CBC WITH DIFFERENTIAL/PLATELET
Basophils Relative: 0.9 % (ref 0.0–3.0)
Eosinophils Relative: 0.4 % (ref 0.0–5.0)
Hemoglobin: 14.9 g/dL (ref 12.0–15.0)
Lymphocytes Relative: 20.5 % (ref 12.0–46.0)
MCV: 96.1 fl (ref 78.0–100.0)
Neutrophils Relative %: 72.5 % (ref 43.0–77.0)
RBC: 4.58 Mil/uL (ref 3.87–5.11)
WBC: 7.9 10*3/uL (ref 4.5–10.5)

## 2012-02-03 LAB — BASIC METABOLIC PANEL
Calcium: 9.3 mg/dL (ref 8.4–10.5)
GFR: 117 mL/min (ref >60.00)
Sodium: 138 mEq/L (ref 135–145)

## 2012-02-03 LAB — TSH: TSH: 1.16 u[IU]/mL (ref 0.35–5.50)

## 2012-02-03 MED ORDER — LOSARTAN POTASSIUM-HCTZ 100-25 MG PO TABS: 1.0000 | ORAL_TABLET | Freq: Every day | ORAL | Status: DC

## 2012-02-03 MED ORDER — BUPROPION HCL ER (XL) 150 MG PO TB24: 150.0000 mg | ORAL_TABLET | Freq: Every day | ORAL | Status: DC

## 2012-02-03 MED ORDER — ESCITALOPRAM OXALATE 20 MG PO TABS: 20.0000 mg | ORAL_TABLET | Freq: Every day | ORAL | Status: DC

## 2012-02-03 MED ORDER — OMEPRAZOLE 40 MG PO CPDR: 40.0000 mg | DELAYED_RELEASE_CAPSULE | Freq: Every day | ORAL | Status: DC

## 2012-02-03 NOTE — Patient Instructions (Addendum)
Follow up in 1 month to recheck mood Start the Wellbutrin in addition to your Lexapro We'll notify you of your lab results Consider counseling as an outlet Call with any questions or concerns Hang in there!!!

## 2012-02-03 NOTE — Progress Notes (Signed)
  Subjective:    Patient ID: Carol Quinn, female    DOB: [birth year], 56 y.o.   MRN: [id number]  HPI 'stress'- pt was ill from Oct 2012- April 2013 and thought that her 'racing heart' and 'low tolerance' were due to her run down state.  'i could just cry, i could verbally assault someone.  i get confused easy.  The slightest little thing will push me over the edge.'  Pt reports 'i always thought i handled stress well'.  But now it's 'like my brain completely shuts down'.  Currently on Lexapro.  Pt reports 'a lot of weight loss' but weights from 2/13 to 1/14 are similar.  Pt reports palpitations are intermittent.  On Nuvigil for hypersomnia but will still have palpitations on days she does not take medicine.  Pt reports both home and work stress.  Not interested in counseling.   Review of Systems For ROS see HPI     Objective:   Physical Exam  Vitals reviewed. Constitutional: She is oriented to person, place, and time. She appears well-developed and well-nourished. No distress.  HENT:  Head: Normocephalic and atraumatic.  Eyes: Conjunctivae normal and EOM are normal. Pupils are equal, round, and reactive to light.  Neck: Normal range of motion. Neck supple. No thyromegaly present.  Cardiovascular: Normal rate, regular rhythm, normal heart sounds and intact distal pulses.   No murmur heard. Pulmonary/Chest: Effort normal and breath sounds normal. No respiratory distress.  Abdominal: Soft. She exhibits no distension. There is no tenderness.  Musculoskeletal: She exhibits no edema.  Lymphadenopathy:    She has no cervical adenopathy.  Neurological: She is alert and oriented to person, place, and time.  Skin: Skin is warm and dry.  Psychiatric: She has a normal mood and affect. Her behavior is normal.          Assessment & Plan:

## 2012-02-05 NOTE — Assessment & Plan Note (Signed)
New.  EKG unchanged from previous.  Check labs to r/o anemia, thyroid abnormality.  Suspect this is due to pt's anxiety but will follow closely.  If palpitations persist, will need holter.  Pt expressed understanding and is in agreement w/ plan.

## 2012-02-05 NOTE — Assessment & Plan Note (Signed)
Deteriorated.  Pt's depression and anxiety much more severe than previous.  Pt doesn't have demonstrated weight gain based on previous data- despite reports of this.  Refusing counseling.  Discussed changing meds vs adding Wellbutrin.  Pt more interested in augmenting w/ wellbutrin b/c 'i like lexapro'.  Will follow closely.

## 2012-02-28 ENCOUNTER — Telehealth: Payer: Self-pay | Admitting: Internal Medicine

## 2012-02-28 MED ORDER — TIOTROPIUM BROMIDE MONOHYDRATE 18 MCG IN CAPS: 18.0000 ug | ORAL_CAPSULE | Freq: Every day | RESPIRATORY_TRACT | Status: DC

## 2012-02-28 NOTE — Telephone Encounter (Signed)
I have sent in refill x 5. Left detail message advising this and to call back if needed anything further.

## 2012-03-08 ENCOUNTER — Ambulatory Visit: Payer: 59 | Admitting: Family Medicine

## 2012-03-29 ENCOUNTER — Ambulatory Visit: Payer: 59 | Admitting: Family Medicine

## 2012-04-06 ENCOUNTER — Other Ambulatory Visit (HOSPITAL_COMMUNITY): Payer: Self-pay | Admitting: Obstetrics and Gynecology

## 2012-04-06 ENCOUNTER — Other Ambulatory Visit: Payer: Self-pay

## 2012-04-06 ENCOUNTER — Other Ambulatory Visit: Payer: Self-pay | Admitting: Obstetrics and Gynecology

## 2012-04-06 DIAGNOSIS — Z1231 Encounter for screening mammogram for malignant neoplasm of breast: Secondary | ICD-10-CM

## 2012-04-19 ENCOUNTER — Ambulatory Visit: Payer: 59 | Admitting: Family Medicine

## 2012-04-20 ENCOUNTER — Ambulatory Visit (HOSPITAL_COMMUNITY)
Admission: RE | Admit: 2012-04-20 | Discharge: 2012-04-20 | Disposition: A | Discharge status: Home / Self Care | Payer: 59 | Source: Ambulatory Visit | Attending: Obstetrics and Gynecology | Admitting: Obstetrics and Gynecology

## 2012-04-20 ENCOUNTER — Ambulatory Visit: Payer: 59 | Admitting: Internal Medicine

## 2012-04-20 DIAGNOSIS — Z1231 Encounter for screening mammogram for malignant neoplasm of breast: Secondary | ICD-10-CM

## 2012-05-11 ENCOUNTER — Ambulatory Visit: Payer: 59

## 2012-05-23 ENCOUNTER — Telehealth: Payer: Self-pay | Admitting: Internal Medicine

## 2012-05-23 MED ORDER — ARMODAFINIL 250 MG PO TABS: 250.0000 mg | ORAL_TABLET | Freq: Every day | ORAL | Status: DC

## 2012-05-23 NOTE — Telephone Encounter (Signed)
Rx called to pharmacy and reminder placed on RX to keep appt.

## 2012-05-23 NOTE — Telephone Encounter (Signed)
Ok to refill Nuvigil. Remind to keep appointment

## 2012-05-23 NOTE — Telephone Encounter (Signed)
Please advise if okay to refill. Thanks.  

## 2012-05-24 ENCOUNTER — Ambulatory Visit: Payer: 59 | Admitting: Family Medicine

## 2012-06-08 ENCOUNTER — Ambulatory Visit (INDEPENDENT_AMBULATORY_CARE_PROVIDER_SITE_OTHER): Payer: 59 | Admitting: Internal Medicine

## 2012-06-08 ENCOUNTER — Encounter: Payer: Self-pay | Admitting: Internal Medicine

## 2012-06-08 VITALS — BP 112/74 | HR 79 | Ht 62.0 in | Wt 125.2 lb

## 2012-06-08 DIAGNOSIS — J449 Chronic obstructive pulmonary disease, unspecified: Secondary | ICD-10-CM

## 2012-06-08 DIAGNOSIS — J4489 Other specified chronic obstructive pulmonary disease: Secondary | ICD-10-CM

## 2012-06-08 DIAGNOSIS — G4710 Hypersomnia, unspecified: Secondary | ICD-10-CM

## 2012-06-08 DIAGNOSIS — F17200 Nicotine dependence, unspecified, uncomplicated: Secondary | ICD-10-CM

## 2012-06-08 DIAGNOSIS — Z720 Tobacco use: Secondary | ICD-10-CM

## 2012-06-08 DIAGNOSIS — J309 Allergic rhinitis, unspecified: Secondary | ICD-10-CM

## 2012-06-08 DIAGNOSIS — J302 Other seasonal allergic rhinitis: Secondary | ICD-10-CM

## 2012-06-08 DIAGNOSIS — J3089 Other allergic rhinitis: Secondary | ICD-10-CM

## 2012-06-08 MED ORDER — ALBUTEROL SULFATE HFA 108 (90 BASE) MCG/ACT IN AERS: 2.0000 | INHALATION_SPRAY | RESPIRATORY_TRACT | Status: DC | PRN

## 2012-06-08 MED ORDER — AZELASTINE-FLUTICASONE 137-50 MCG/ACT NA SUSP: 1.0000 | Freq: Every day | NASAL | Status: DC

## 2012-06-08 NOTE — Patient Instructions (Addendum)
Sample Dymista nasal spray   1-2 puffs each nostril once daily at bedtime  Script sent to refill your rescue inhaler   Please keep trying to stop smoking

## 2012-06-08 NOTE — Progress Notes (Signed)
Patient ID: Carol Quinn, female    DOB: 02/27/1955, 57 y.o.   MRN: [id number]  HPI 69 yoF former smoker followed for COPD and hypersomnia. Last here Sept 19, when she had been sick after a cruise. She did gradually get better. Comes now- to f/u after MSLT done for assessment of her persistent c/o "tired". Today she also says she has been having some sore throat this week w/o fever. Some dry cough. Denies fever or nodes. Lab- MSLT 04/28/10- short at 4.1 minutes, SOREM 0, so nonspecific, c/w primary ideopathic hypersomnia.  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 Still has a sense of fatigue, feeling she can't put one foot in front of another,  and easy confusion. Caffeine has no effect. Thyroid has been checked a lot. Sister has lupus, but she has tested negative.  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 We have again discussed motivation and support for quitting . Acute visit- Called 09/13/10 - On call Dr. gave prednisone taper and Zpak for 4 day of hx cough, green sputum and SOB. 1 day fever. No better so far with this Rx. Using her inhaler more.   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 Continues to smoke against advice. Hydromet cough syrup made her "paranoid"-she cried and screamed. 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 Nuvigil has allowed her to get up and get to work on a regular basis while sleeping soundly at night. If she skips a dose she expects to sleep all day. Her breathing has been stable with Symbicort. She rarely needs a rescue inhaler. She has had no acute respiratory illness  recently.  04/06/2011 Acute OV  Complains of prod cough with brown mucus, increased SOB, wheezing, tightness in chest x1.5week, progessively getting worse.  Has been using over-the-counter cough products, without any relief. Plays increased wheezing, and shortness of breath over last few days. We talked adeptly about smoking cessation. She denies any hemoptysis, chest pain, orthopnea, PND, or leg swelling. >>tx w/ Omnicef/Prednisone taper   04/15/2011 [facility]  Patient returns for a post hospitalization visit. Patient was admitted March 29- 04/10/2011 for a COPD exacerbation. Patient presented with progressively worsening cough, congestion, and wheezing. She was treated with IV antibiotics, steroids, and aggressive nebulizer treatments. X-ray showed no acute changes. Once again, she was recommended to quit smoking. She was discharged on Avelox and a prednisone taper. Since discharge. Patient says she is still feeling weak with no energy.  No smoking x 1 week. Has 2 days left on Avelox and Prednisone .  Discussed smoking cesstation.   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 Avelox helped. Back at work, off of prednisone and antibiotics with no cough. Smoking an electronic cigarette now. We discussed smoking cessation again. 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 Chest tight, racing heart no increased use of bronchodilator. She continues Nuvigil "very helpful" for help in her stay awake at work. Trying to get adequate sleep at night. COPD assessment test  (CAT) score 32/40.  01/16/11 Acute OV  Complains of prod cough with brown/green mucus, hoarseness, increased SOB, wheezing, tightness in chest, back pain, chills x2weeks.  finished levaquin yesterday.  Called in levaquin 500mg  x 7 days on 12/30 for above symptoms Some better but not gone completely , still has thick mucus , green at time.  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 Pt c/o chest congestion (not bad right now), she has good and bad days with her breathing, slight cough w/ cream color phlegm, chest tx at times. Increased cough over the past month and chest tightness and postnasal drip. Rescue inhaler and Symbicort have been sufficient. Nebulizer helps in the winter time. Still smoking against advice. Nuvigil still works well for her.   ROS-see HPI Constitutional:   No-   weight loss, night sweats, fevers, chills, +fatigue, lassitude. HEENT:   No-  headaches, difficulty swallowing, tooth/dental problems, sore throat,       No-  sneezing, itching, ear ache, nasal congestion, +post nasal drip,  CV:  No-   chest pain, orthopnea, PND, swelling in lower extremities, anasarca,                                  dizziness, palpitations Resp: No-   shortness of breath with exertion or at rest.              No-   productive cough,  No non-productive cough,  No- coughing up of blood.              No-   change in color of mucus.  No- wheezing.   Skin: No-   rash or lesions. GI:  No-   heartburn, indigestion, abdominal pain, nausea, vomiting,  GU:  MS:  No-   joint pain or swelling.   Neuro-     nothing unusual Psych:  No- change in mood or affect. No depression or anxiety.  No memory loss.  OBJ- Physical Exam General- Alert, Oriented, Affect-appropriate, Distress- none acute Skin- rash-none, lesions- none, excoriation- none Lymphadenopathy- none Head-  atraumatic            Eyes- Gross vision intact, PERRLA, conjunctivae and secretions clear            Ears- Hearing, canals-normal            Nose- Clear, no-Septal dev, mucus, polyps, erosion, perforation             Throat- Mallampati II , mucosa clear , drainage- none, tonsils- atrophic Neck- flexible , trachea midline, no stridor , thyroid nl, carotid no bruit Chest - symmetrical excursion , unlabored           Heart/CV- RRR , no murmur , no gallop  , no rub, nl s1 s2                           - JVD- none , edema- none, stasis changes- none, varices- none           Lung- clear to P&A, wheeze- none, cough- none ,  dullness-none, rub- none           Chest wall-  Abd-  Br/ Gen/ Rectal- Not done, not indicated Extrem- cyanosis- none, clubbing, none, atrophy- none, strength- nl Neuro- grossly intact to observation

## 2012-06-14 ENCOUNTER — Encounter: Payer: Self-pay | Admitting: Family Medicine

## 2012-06-14 ENCOUNTER — Ambulatory Visit (INDEPENDENT_AMBULATORY_CARE_PROVIDER_SITE_OTHER): Payer: 59 | Admitting: Family Medicine

## 2012-06-14 VITALS — BP 120/80 | HR 83 | Temp 98.3°F | Ht 61.75 in | Wt 125.8 lb

## 2012-06-14 DIAGNOSIS — M545 Low back pain, unspecified: Secondary | ICD-10-CM

## 2012-06-14 DIAGNOSIS — G5710 Meralgia paresthetica, unspecified lower limb: Secondary | ICD-10-CM

## 2012-06-14 DIAGNOSIS — F329 Major depressive disorder, single episode, unspecified: Secondary | ICD-10-CM

## 2012-06-14 DIAGNOSIS — F32A Depression, unspecified: Secondary | ICD-10-CM

## 2012-06-14 DIAGNOSIS — I10 Essential (primary) hypertension: Secondary | ICD-10-CM

## 2012-06-14 NOTE — Patient Instructions (Addendum)
Schedule your complete physical in 6 months We'll notify you of your lab results and make any changes if needed When you have again have back pain/swelling- please call and get an appt so we can evaluate it The burning and numbness of your thighs is called meralgia paresthetica and is most likely positional Call with any questions or concerns Hang in there!!

## 2012-06-14 NOTE — Progress Notes (Signed)
  Subjective:    Patient ID: Carol Quinn, female    DOB: 1955-11-07, 57 y.o.   MRN: [id number]  HPI Depression- chronic problem.  At last visit added Welbutrin to Lexapro.  Pt reports sxs have improved.  'as long as i'm at home, i have no issues'.  HTN- chronic problem, on Hyzaar.  No CP, SOB, HAs, visual changes, edema.  LBP- sxs started 'a couple of yrs ago'.  Noticed a place in spine in lower back that will swell when she has pain.  Pt reports swelling occurs on either side of spine.  No current pain or swelling.  Thigh burning- localized to lateral upper thighs, intermittent.  No rash, no skin lesions.  Resolves spontaneously.   Review of Systems For ROS see HPI     Objective:   Physical Exam  Vitals reviewed. Constitutional: She is oriented to person, place, and time. She appears well-developed and well-nourished. No distress.  HENT:  Head: Normocephalic and atraumatic.  Eyes: Conjunctivae and EOM are normal. Pupils are equal, round, and reactive to light.  Neck: Normal range of motion. Neck supple. No thyromegaly present.  Cardiovascular: Normal rate, regular rhythm, normal heart sounds and intact distal pulses.   No murmur heard. Pulmonary/Chest: Effort normal and breath sounds normal. No respiratory distress.  Abdominal: Soft. She exhibits no distension. There is no tenderness.  Musculoskeletal: She exhibits no edema and no tenderness (no TTP over spine or paraspinal muscles, full flexion/extension of spine).  Lymphadenopathy:    She has no cervical adenopathy.  Neurological: She is alert and oriented to person, place, and time. She has normal reflexes. No cranial nerve deficit.  Skin: Skin is warm and dry.  Psychiatric: She has a normal mood and affect. Her behavior is normal.          Assessment & Plan:

## 2012-06-15 LAB — BASIC METABOLIC PANEL
BUN: 19 mg/dL (ref 6–23)
GFR: 119.26 mL/min (ref >60.00)
Potassium: 3.4 mEq/L — ABNORMAL LOW (ref 3.5–5.1)
Sodium: 139 mEq/L (ref 135–145)

## 2012-06-17 NOTE — Assessment & Plan Note (Signed)
New to provider, pt reports this is ongoing issue but not currently having sxs.  She reports bilateral swellings on either side of lumbar spine when this occurs- suspect lumbar paraspinal spasm.  Encouraged pt to return when symptomatic.  Will follow.

## 2012-06-17 NOTE — Assessment & Plan Note (Signed)
Improved since adding wellbutrin at last visit.  Will continue to follow.

## 2012-06-17 NOTE — Assessment & Plan Note (Signed)
Chronic problem.  Asymptomatic.  Check labs.  No anticipated med changes. 

## 2012-06-17 NOTE — Assessment & Plan Note (Signed)
New.  Pt's description of pain consistent w/ meralgia paresthetica.  Discussed importance of avoidance of nerve compression- weight loss, avoiding tight pants, etc.  Will follow.

## 2012-06-19 NOTE — Assessment & Plan Note (Signed)
Postnasal drip. I pointed out relation to smoking. Plan-try sample Dymista.

## 2012-06-19 NOTE — Assessment & Plan Note (Signed)
Directly related to her smoking. Medications are adequate.

## 2012-06-19 NOTE — Assessment & Plan Note (Signed)
No change in her pattern. Sleep habits adequate by description. Plan-continue Nuvigil

## 2012-06-19 NOTE — Assessment & Plan Note (Signed)
We have talked again about smoking cessation and available support, encouraging her to try.

## 2012-06-25 ENCOUNTER — Telehealth: Payer: Self-pay | Admitting: Internal Medicine

## 2012-06-26 NOTE — Telephone Encounter (Signed)
Ok to refill Nuvigil 

## 2012-06-27 ENCOUNTER — Telehealth: Payer: Self-pay | Admitting: Internal Medicine

## 2012-06-27 MED ORDER — ARMODAFINIL 250 MG PO TABS: 250.0000 mg | ORAL_TABLET | Freq: Every day | ORAL | Status: DC

## 2012-06-27 MED ORDER — DOXYCYCLINE HYCLATE 100 MG PO TABS: ORAL_TABLET | ORAL | Status: DC

## 2012-06-27 MED ORDER — PREDNISONE 10 MG PO TABS: ORAL_TABLET | ORAL | Status: DC

## 2012-06-27 NOTE — Telephone Encounter (Signed)
Pt aware of recs. RX called in. Nothing further was needed

## 2012-06-27 NOTE — Telephone Encounter (Signed)
Called refill to pharmacy tech.

## 2012-06-27 NOTE — Telephone Encounter (Signed)
Doxycycline 100 mg, # 8, 2 today then one daily Prednisone 10 mg, # 20, 4 X 2 DAYS, 3 X 2 DAYS, 2 X 2 DAYS, 1 X 2 DAYS May want to start now with something otc for yeast vaginitis- can come easily with pred plus abx

## 2012-06-27 NOTE — Telephone Encounter (Signed)
Pt c/o sinus congestion which is now settled in her chest. Progressively worsening over the past 3 weeks. Pt c/o chest congestion, prod cough with Eliazer Hemphill mucus, wheezing, SOB, chest tx and chest pain from cough x 2 weeks.  Pt has started using neb bid Pt requesting abx and pred Allergies  Allergen Reactions  . Clarithromycin     Rash   . Codeine   . Hydrocodone-Homatropine     paranoia  . Penicillins    [facility]  Please advise Dr Maple Hudson. Thanks.

## 2012-07-02 ENCOUNTER — Telehealth: Payer: Self-pay | Admitting: Internal Medicine

## 2012-07-02 ENCOUNTER — Other Ambulatory Visit: Payer: Self-pay | Admitting: Internal Medicine

## 2012-07-02 MED ORDER — ALBUTEROL SULFATE (2.5 MG/3ML) 0.083% IN NEBU: 2.5000 mg | INHALATION_SOLUTION | RESPIRATORY_TRACT | Status: DC | PRN

## 2012-07-02 MED ORDER — PREDNISONE 10 MG PO TABS: ORAL_TABLET | ORAL | Status: DC

## 2012-07-02 MED ORDER — DOXYCYCLINE HYCLATE 100 MG PO TABS: ORAL_TABLET | ORAL | Status: DC

## 2012-07-02 NOTE — Telephone Encounter (Signed)
Per CY-okay to refill albuterol nebulizer, Prednisone 10 mg #20 take 4 x 2 days, 3 x 2 days, 2 x  2 days, 1 x 2 days, then stop no refills, Doxycycline 100 mg #8 take 2 today then 1 daily no refills.

## 2012-07-02 NOTE — Telephone Encounter (Signed)
Pt aware and rx's sent nothing further needed.

## 2012-07-02 NOTE — Telephone Encounter (Signed)
Spoke with pt still sick . Productive cough greenish ,sob, wheezing, chest tightness.  Requesting. Albuterol nebs,prednisone 10mg  and doxycycline 100mg .  Allergies  Allergen Reactions  . Clarithromycin     Rash   . Codeine   . Hydrocodone-Homatropine     paranoia  . Penicillins    Dr Maple Hudson please advise Thank you

## 2012-07-02 NOTE — Telephone Encounter (Signed)
Pt stated pharmacy never received rx for albuterol neb meds not called in . rx printed.  rx resent the patient is aware.

## 2012-07-14 ENCOUNTER — Other Ambulatory Visit: Payer: Self-pay | Admitting: Family Medicine

## 2012-09-07 ENCOUNTER — Ambulatory Visit (INDEPENDENT_AMBULATORY_CARE_PROVIDER_SITE_OTHER): Payer: 59 | Admitting: Family Medicine

## 2012-09-07 ENCOUNTER — Encounter: Payer: Self-pay | Admitting: Family Medicine

## 2012-09-07 VITALS — BP 118/78 | HR 83 | Temp 98.6°F | Ht 61.75 in | Wt 125.6 lb

## 2012-09-07 DIAGNOSIS — M545 Low back pain, unspecified: Secondary | ICD-10-CM

## 2012-09-07 MED ORDER — CYCLOBENZAPRINE HCL 10 MG PO TABS: 10.0000 mg | ORAL_TABLET | Freq: Three times a day (TID) | ORAL | Status: DC | PRN

## 2012-09-07 MED ORDER — PREDNISONE 10 MG PO TABS: ORAL_TABLET | ORAL | Status: DC

## 2012-09-07 NOTE — Progress Notes (Signed)
  Subjective:    Patient ID: MOMOKA STRINGFIELD, female    DOB: [birth year], 56 y.o.   MRN: [id number]  HPI Back pain- chronic problem for pt.  Intermittent x2 months.  Described as 'severe'- occurs w/ sitting, standing, changing positions.  Has tried heat, ice, icy hot, thermacare patches, wearing flats/sneakers.  Ibuprofen w/out relief.  No fevers, no bowel or bladder incontinence.  No leg weakness/numbness.  Was told previously that she had early DDD.   Review of Systems For ROS see HPI     Objective:   Physical Exam  Vitals reviewed. Constitutional: She is oriented to person, place, and time. She appears well-developed and well-nourished. No distress.  Cardiovascular: Intact distal pulses.   Musculoskeletal:  + lumbar paraspinal tenderness and spasm Pain w/ flexion>extension (-) SLR bilaterally  Neurological: She is alert and oriented to person, place, and time. She has normal reflexes. Coordination normal.  Skin: Skin is warm and dry.  Psychiatric: She has a normal mood and affect. Her behavior is normal.          Assessment & Plan:

## 2012-09-07 NOTE — Patient Instructions (Addendum)
Start the Prednisone as directed- take w/ food Use the flexeril- muscle relaxer- as needed.  It will cause drowsiness HEAT! We'll call you with your ortho appt Call with any questions or concerns Hang in there!

## 2012-09-07 NOTE — Assessment & Plan Note (Signed)
Recurrent problem for pt.  No TTP over spine but TTP over paraspinal muscles.  Start pred taper, muscle relaxers, heating pad.  Refer to ortho for complete evaluation and management.  Reviewed supportive care and red flags that should prompt return.  Pt expressed understanding and is in agreement w/ plan.

## 2012-09-12 ENCOUNTER — Other Ambulatory Visit: Payer: Self-pay | Admitting: General Practice

## 2012-09-12 ENCOUNTER — Telehealth: Payer: Self-pay | Admitting: General Practice

## 2012-09-12 NOTE — Telephone Encounter (Signed)
Returned call to pt ok by tabori to fill allegra. LMOVM for pt to advise what pharmacy she prefers.

## 2012-09-13 MED ORDER — FEXOFENADINE HCL 180 MG PO TABS: 180.0000 mg | ORAL_TABLET | Freq: Every day | ORAL | Status: DC

## 2012-09-13 NOTE — Telephone Encounter (Signed)
Patient returned your call. She would the medication sent to [facility].

## 2012-09-13 NOTE — Telephone Encounter (Signed)
Ed filled to [facility].

## 2012-09-21 ENCOUNTER — Other Ambulatory Visit: Payer: Self-pay | Admitting: *Deleted

## 2012-09-21 MED ORDER — TIOTROPIUM BROMIDE MONOHYDRATE 18 MCG IN CAPS: 18.0000 ug | ORAL_CAPSULE | Freq: Every day | RESPIRATORY_TRACT | Status: DC

## 2012-10-22 ENCOUNTER — Other Ambulatory Visit: Payer: Self-pay | Admitting: Family Medicine

## 2012-10-22 NOTE — Telephone Encounter (Signed)
Med filled.  

## 2012-11-15 ENCOUNTER — Ambulatory Visit (INDEPENDENT_AMBULATORY_CARE_PROVIDER_SITE_OTHER)
Admission: RE | Admit: 2012-11-15 | Discharge: 2012-11-15 | Disposition: A | Discharge status: Home / Self Care | Payer: 59 | Source: Ambulatory Visit | Attending: Internal Medicine | Admitting: Internal Medicine

## 2012-11-15 ENCOUNTER — Ambulatory Visit (INDEPENDENT_AMBULATORY_CARE_PROVIDER_SITE_OTHER): Payer: 59 | Admitting: Internal Medicine

## 2012-11-15 ENCOUNTER — Encounter: Payer: Self-pay | Admitting: Internal Medicine

## 2012-11-15 VITALS — BP 100/60 | HR 88 | Ht 62.0 in | Wt 130.6 lb

## 2012-11-15 DIAGNOSIS — F17200 Nicotine dependence, unspecified, uncomplicated: Secondary | ICD-10-CM

## 2012-11-15 DIAGNOSIS — J441 Chronic obstructive pulmonary disease with (acute) exacerbation: Secondary | ICD-10-CM

## 2012-11-15 DIAGNOSIS — Z720 Tobacco use: Secondary | ICD-10-CM

## 2012-11-15 MED ORDER — METHYLPREDNISOLONE ACETATE 80 MG/ML IJ SUSP
80.0000 mg | Freq: Once | INTRAMUSCULAR | Status: AC
  Administered 2012-11-15: 80 mg via INTRAMUSCULAR

## 2012-11-15 MED ORDER — LEVALBUTEROL HCL 0.63 MG/3ML IN NEBU
0.6300 mg | INHALATION_SOLUTION | Freq: Once | RESPIRATORY_TRACT | Status: DC
Start: 2012-11-15 — End: 2012-11-15

## 2012-11-15 MED ORDER — LEVOFLOXACIN 500 MG PO TABS: 500.0000 mg | ORAL_TABLET | Freq: Every day | ORAL | Status: DC

## 2012-11-15 MED ORDER — TRAMADOL HCL 50 MG PO TABS: ORAL_TABLET | ORAL | Status: DC

## 2012-11-15 MED ORDER — LEVALBUTEROL HCL 0.63 MG/3ML IN NEBU
0.6300 mg | INHALATION_SOLUTION | Freq: Once | RESPIRATORY_TRACT | Status: AC
  Administered 2012-11-15: 0.63 mg via RESPIRATORY_TRACT

## 2012-11-15 NOTE — Progress Notes (Signed)
Patient ID: Carol Quinn, female    DOB: 02/27/1955, 57 y.o.   MRN: [id number]  HPI 69 yoF former smoker followed for COPD and hypersomnia. Last here Sept 19, when she had been sick after a cruise. She did gradually get better. Comes now- to f/u after MSLT done for assessment of her persistent c/o "tired". Today she also says she has been having some sore throat this week w/o fever. Some dry cough. Denies fever or nodes. Lab- MSLT 04/28/10- short at 4.1 minutes, SOREM 0, so nonspecific, c/w primary ideopathic hypersomnia.  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 Still has a sense of fatigue, feeling she can't put one foot in front of another,  and easy confusion. Caffeine has no effect. Thyroid has been checked a lot. Sister has lupus, but she has tested negative.  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 We have again discussed motivation and support for quitting . Acute visit- Called 09/13/10 - On call Dr. gave prednisone taper and Zpak for 4 day of hx cough, green sputum and SOB. 1 day fever. No better so far with this Rx. Using her inhaler more.   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 Continues to smoke against advice. Hydromet cough syrup made her "paranoid"-she cried and screamed. 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 Nuvigil has allowed her to get up and get to work on a regular basis while sleeping soundly at night. If she skips a dose she expects to sleep all day. Her breathing has been stable with Symbicort. She rarely needs a rescue inhaler. She has had no acute respiratory illness  recently.  04/06/2011 Acute OV  Complains of prod cough with brown mucus, increased SOB, wheezing, tightness in chest x1.5week, progessively getting worse.  Has been using over-the-counter cough products, without any relief. Plays increased wheezing, and shortness of breath over last few days. We talked adeptly about smoking cessation. She denies any hemoptysis, chest pain, orthopnea, PND, or leg swelling. >>tx w/ Omnicef/Prednisone taper   04/15/2011 [facility]  Patient returns for a post hospitalization visit. Patient was admitted March 29- 04/10/2011 for a COPD exacerbation. Patient presented with progressively worsening cough, congestion, and wheezing. She was treated with IV antibiotics, steroids, and aggressive nebulizer treatments. X-ray showed no acute changes. Once again, she was recommended to quit smoking. She was discharged on Avelox and a prednisone taper. Since discharge. Patient says she is still feeling weak with no energy.  No smoking x 1 week. Has 2 days left on Avelox and Prednisone .  Discussed smoking cesstation.   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 Avelox helped. Back at work, off of prednisone and antibiotics with no cough. Smoking an electronic cigarette now. We discussed smoking cessation again. 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 Chest tight, racing heart no increased use of bronchodilator. She continues Nuvigil "very helpful" for help in her stay awake at work. Trying to get adequate sleep at night. COPD assessment test  (CAT) score 32/40.  01/16/11 Acute OV  Complains of prod cough with brown/green mucus, hoarseness, increased SOB, wheezing, tightness in chest, back pain, chills x2weeks.  finished levaquin yesterday.  Called in levaquin 500mg  x 7 days on 12/30 for above symptoms Some better but not gone completely , still has thick mucus , green at time.  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 Pt c/o chest congestion (not bad right now), she has good and bad days with her breathing, slight cough w/ cream color phlegm, chest tx at times. Increased cough over the past month and chest tightness and postnasal drip. Rescue inhaler and Symbicort have been sufficient. Nebulizer helps in the winter time. Still smoking against advice. Nuvigil still works well for her.  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 Denies fever, chills, sore throat or GI upset. Last got doxycycline and prednisone in June. Is not trying to stop smoking. We discussed this again. CXR 04/07/12 IMPRESSION:  Emphysema without acute cardiopulmonary disease.  Original Report Authenticated By: Andreas Newport, M.D.  ROS-see HPI Constitutional:   No-   weight loss, night sweats, fevers, chills, +fatigue, lassitude. HEENT:   No-  headaches, difficulty swallowing, tooth/dental problems, sore throat,       No-  sneezing, itching, ear ache, nasal congestion, +post nasal drip,  CV:  No-   chest pain, orthopnea, PND, swelling in lower extremities, anasarca, dizziness, palpitations Resp: No-   shortness of breath with exertion or at rest.              + productive cough,  No non-productive cough,  No- coughing up of  blood.              + change in color of mucus.  No- wheezing.   Skin: No-   rash or lesions. GI:  No-   heartburn, indigestion, abdominal pain, nausea, vomiting,  GU:  MS:  No-   joint pain or swelling.   Neuro-     nothing unusual Psych:  No- change in mood or affect. No depression or anxiety.  No memory loss.  OBJ- Physical Exam General- Alert, Oriented, Affect-appropriate, Distress- none acute. + Looks tired Skin- rash-none, lesions- none, excoriation- none Lymphadenopathy- none Head- atraumatic            Eyes- Gross vision intact, PERRLA, conjunctivae and secretions clear            Ears- Hearing, canals-normal            Nose- +turbinate edema, no-Septal dev, mucus, polyps, erosion, perforation             Throat- Mallampati II , mucosa clear/ not red , drainage- none, tonsils- atrophic Neck- flexible , trachea midline, no stridor , thyroid nl, carotid no bruit Chest - symmetrical excursion , unlabored           Heart/CV- RRR , no murmur , no gallop  , no rub, nl s1 s2                           -  JVD- none , edema- none, stasis changes- none, varices- none           Lung-  wheeze- none, cough+ rhonchi , dullness-none, rub- none           Chest wall-  Abd-  Br/ Gen/ Rectal- Not done, not indicated Extrem- cyanosis- none, clubbing, none, atrophy- none, strength- nl Neuro- grossly intact to observation

## 2012-11-15 NOTE — Patient Instructions (Signed)
Scripts printed for levaquin antibiotic and for tramadol for cough  Neb xop 0.63  Depo 80  Plenty of fluids, get rest and avoid getting chilled  Order- CXR- dx acute exacerbation of COPD

## 2012-11-16 ENCOUNTER — Telehealth: Payer: Self-pay | Admitting: Internal Medicine

## 2012-11-16 NOTE — Telephone Encounter (Signed)
Called, spoke with pt.  Informed her of below recs per Dr. Maple Hudson.  She verbalized understanding of this and voiced no further questions or concerns at this time. Pt is to call back if this does not help or is symptoms worsen.

## 2012-11-16 NOTE — Telephone Encounter (Signed)
She has hx of intolerance to codeine/ hydrocodone, which is in most prescription cough syrups. She can try taking 2 tramadol at a time. Try taking Delsym otc, in addition.

## 2012-11-16 NOTE — Telephone Encounter (Signed)
Carol Quinn was calling to inform pt:  Notes Recorded by Waymon Budge, MD on 11/15/2012 at 7:57 PM CXR- lungs are clear. No pneumonia or active process.  --------  Called, spoke with pt.  Informed her of above results per Dr. Maple Hudson.  She verbalized understanding of this.  Rx was sent in for tramadol 50 mg q6h prn cough.  Pt reports she took 1 last night and 1 this morning with no relief at all.  She isn't taking anything else to help with cough, is still taking the levaquin, and is requesting further recs.  Dr. Maple Hudson, pls advise.  Thank you.  [facility] Last OV with CDY: 11/15/12  Allergies verified with pt: Allergies  Allergen Reactions  . Clarithromycin     Rash   . Codeine   . Hydrocodone-Homatropine     paranoia  . Penicillins

## 2012-11-16 NOTE — Progress Notes (Signed)
Quick Note:  Spoke with pt. Informed her of cxr results per Dr. Maple Hudson. She verbalized understanding. ______

## 2012-11-22 ENCOUNTER — Telehealth: Payer: Self-pay | Admitting: Internal Medicine

## 2012-11-22 MED ORDER — PREDNISONE 10 MG PO TABS: ORAL_TABLET | ORAL | Status: DC

## 2012-11-22 MED ORDER — LEVOFLOXACIN 500 MG PO TABS: 500.0000 mg | ORAL_TABLET | Freq: Every day | ORAL | Status: DC

## 2012-11-22 NOTE — Telephone Encounter (Signed)
Spoke with the pt  She was last seen here on 11/15/12- given levaquin, depo and tramadol  She reports that her wheezing and SOB are unchanged  The cough is minimally improved, but still producing moderate amount of green sputum  She reports any new co's  Has already finished the levaquin and has 1 tramadol tablet left She states that usually it takes her 2 rounds of abx before she gets better Still smoking "some" and I advised her against this if at all possible Next ov 12/14/12 Please advise, thanks! Allergies  Allergen Reactions  . Clarithromycin     Rash   . Codeine   . Hydrocodone-Homatropine     paranoia  . Penicillins

## 2012-11-22 NOTE — Telephone Encounter (Signed)
Rxs were sent to the pharm  Left detailed msg on machine for the pt so that she is aware per her request

## 2012-11-22 NOTE — Telephone Encounter (Signed)
Per CY-refill Levaquin and prednisone taper this time only. Thanks.

## 2012-11-29 NOTE — Assessment & Plan Note (Signed)
Acute exacerbation of chronic bronchitis/emphysema Plan-emphasis again on smoking cessation. Extra fluids. Tramadol for cough.CXR, levaquin, neb xop, Depo-Medrol

## 2012-11-29 NOTE — Assessment & Plan Note (Signed)
I tried to at least get her to admit she will consider smoking cessation

## 2012-11-30 ENCOUNTER — Other Ambulatory Visit: Payer: Self-pay | Admitting: Family Medicine

## 2012-11-30 ENCOUNTER — Telehealth: Payer: Self-pay | Admitting: Internal Medicine

## 2012-11-30 MED ORDER — DOXYCYCLINE HYCLATE 100 MG PO TABS: ORAL_TABLET | ORAL | Status: DC

## 2012-11-30 MED ORDER — TRAMADOL HCL 50 MG PO TABS: ORAL_TABLET | ORAL | Status: DC

## 2012-11-30 NOTE — Telephone Encounter (Signed)
lmomtcb x1 

## 2012-11-30 NOTE — Telephone Encounter (Signed)
Per CY-okay to refill Tramadol.

## 2012-11-30 NOTE — Telephone Encounter (Signed)
I have called RX into the pharmacy. Nothing further needed

## 2012-11-30 NOTE — Telephone Encounter (Signed)
Pt called back. She c/o deep productive cough w/ green phlem, some PND, wheezing and slight increase SOB x 2 weeks now. Denies any f/c/s/n/v. She has been on 2 rounds of levaquin. She has been taking delsym and her tramadol 1 po BID. She is requesting further recs bc she does not want to get worse. Please advise Dr. Maple Hudson thanks  --if pt does not answer leave recs on VM --[facility]    Allergies  Allergen Reactions  . Clarithromycin     Rash   . Codeine   . Hydrocodone-Homatropine     paranoia  . Penicillins

## 2012-11-30 NOTE — Telephone Encounter (Signed)
Pt is aware of recs. She is also requesting a refill on tramadol. This was last refilled 11/15/12 #40 x 0 refills. Pt does not need a call back unless the tramadol is not going to be called in. Please advise Dr. Maple Hudson thanks

## 2012-11-30 NOTE — Telephone Encounter (Signed)
We need to go to a different drug class antibiotic Offer doxycycline 100 mg, # 8, 2 today then one daily

## 2012-12-03 NOTE — Telephone Encounter (Signed)
Med filled.  

## 2012-12-12 ENCOUNTER — Telehealth: Payer: Self-pay | Admitting: Internal Medicine

## 2012-12-12 MED ORDER — PREDNISONE 10 MG PO TABS: ORAL_TABLET | ORAL | Status: DC

## 2012-12-12 MED ORDER — DOXYCYCLINE HYCLATE 100 MG PO TABS: ORAL_TABLET | ORAL | Status: DC

## 2012-12-12 NOTE — Telephone Encounter (Signed)
Per CY-give Prednisone 10 mg #20 take 4 x 2 days, 3 x 2 days, 2 x 2 days, 1 x 2 days, then stop no refills.  

## 2012-12-12 NOTE — Telephone Encounter (Signed)
Spoke to pt. States that she is not feeling any better since seeing CY on 11/15/12. Reports coughing with production of green mucus, SOB, congestion and wheezing. Did complete Levaquin, Doxy, Prednisone and Tramadol with no relief. Her father-in-law passed away 31-May-2022 night, his funeral is on Friday. That is why she will not make her appointment on Friday. Would like CY recs.  Allergies  Allergen Reactions  . Clarithromycin     Rash   . Codeine   . Hydrocodone-Homatropine     paranoia  . Penicillins     Current Outpatient Prescriptions on File Prior to Visit  Medication Sig Dispense Refill  . albuterol (PROVENTIL HFA;VENTOLIN HFA) 108 (90 BASE) MCG/ACT inhaler Inhale 2 puffs into the lungs every 4 (four) hours as needed for wheezing.  1 Inhaler  prn  . albuterol (PROVENTIL) (2.5 MG/3ML) 0.083% nebulizer solution Take 3 mLs (2.5 mg total) by nebulization every 4 (four) hours as needed for wheezing or shortness of breath.  75 mL  12  . Armodafinil (NUVIGIL) 250 MG tablet Take 1 tablet (250 mg total) by mouth daily.  30 tablet  5  . Azelastine-Fluticasone (DYMISTA) 137-50 MCG/ACT SUSP Place 1-2 sprays into the nose at bedtime.  1 Bottle  0  . budesonide-formoterol (SYMBICORT) 80-4.5 MCG/ACT inhaler Inhale 2 puffs into the lungs 2 (two) times daily.  1 Inhaler  4  . buPROPion (WELLBUTRIN XL) 150 MG 24 hr tablet TAKE 1 TABLET BY MOUTH DAILY  30 tablet  4  . doxycycline (VIBRA-TABS) 100 MG tablet Take 2 tabs today then 1 tab daily until gone  8 tablet  0  . escitalopram (LEXAPRO) 20 MG tablet Take 1 tablet (20 mg total) by mouth daily.  30 tablet  6  . estrogens conjugated, synthetic B, (ENJUVIA) 0.625 MG tablet Take 0.625 mg by mouth daily.        . fexofenadine (ALLEGRA) 180 MG tablet Take 1 tablet (180 mg total) by mouth daily.  30 tablet  6  . levofloxacin (LEVAQUIN) 500 MG tablet Take 1 tablet (500 mg total) by mouth daily.  7 tablet  0  . levofloxacin (LEVAQUIN) 500 MG tablet Take 1  tablet (500 mg total) by mouth daily.  7 tablet  0  . losartan-hydrochlorothiazide (HYZAAR) 100-25 MG per tablet TAKE 1 TABLET BY MOUTH DAILY  30 tablet  3  . omeprazole (PRILOSEC) 40 MG capsule TAKE 1 CAPSULE BY MOUTH ONCE DAILY.  30 capsule  2  . pentoxifylline (TRENTAL) 400 MG CR tablet Take 1 tablet by mouth Twice daily.      . predniSONE (DELTASONE) 10 MG tablet 4 x 2 days, 3 x 2 days, 2 x 2 days, 1 x 2 days then stop  20 tablet  0  . SUMAtriptan (IMITREX) 100 MG tablet Take as needed for migraines.       . tiotropium (SPIRIVA) 18 MCG inhalation capsule Place 1 capsule (18 mcg total) into inhaler and inhale daily.  30 capsule  5  . traMADol (ULTRAM) 50 MG tablet 1 every 6 hours as needed for cough  40 tablet  0  . zolmitriptan (ZOMIG) 5 MG nasal solution Take as directed as needed for migraines        No current facility-administered medications on file prior to visit.    CY - please advise. Thanks.

## 2012-12-12 NOTE — Telephone Encounter (Signed)
Left detailed message rx's have been sent. Will sign off message

## 2012-12-12 NOTE — Telephone Encounter (Signed)
Per CDY: offer doxycyline 100mg  #8  2 today then 1 daily until gone and she can take Mucinex DM.  Called spoke with patient, advised of CDY's recs as stated above.  Pt okay with these recommendations and verbalized her understanding, however pt is specifically asking for some prednisone.  Her last rx on this was 11.13.14 for 8day taper.  Abx sent to verified pharmacy.  Dr Maple Hudson please advise on pred taper, thank you.

## 2012-12-14 ENCOUNTER — Ambulatory Visit: Payer: 59 | Admitting: Internal Medicine

## 2012-12-21 ENCOUNTER — Encounter: Payer: Self-pay | Admitting: Family Medicine

## 2012-12-21 ENCOUNTER — Ambulatory Visit (INDEPENDENT_AMBULATORY_CARE_PROVIDER_SITE_OTHER): Payer: 59 | Admitting: Family Medicine

## 2012-12-21 VITALS — BP 120/80 | HR 83 | Temp 98.0°F | Resp 16 | Ht 63.75 in | Wt 131.1 lb

## 2012-12-21 DIAGNOSIS — Z23 Encounter for immunization: Secondary | ICD-10-CM

## 2012-12-21 DIAGNOSIS — R079 Chest pain, unspecified: Secondary | ICD-10-CM

## 2012-12-21 DIAGNOSIS — Z0000 Encounter for general adult medical examination without abnormal findings: Secondary | ICD-10-CM

## 2012-12-21 LAB — HEPATIC FUNCTION PANEL
AST: 14 U/L (ref 0–37)
Albumin: 4.1 g/dL (ref 3.5–5.2)
Bilirubin, Direct: 0 mg/dL (ref 0.0–0.3)
Total Bilirubin: 0.4 mg/dL (ref 0.3–1.2)

## 2012-12-21 LAB — BASIC METABOLIC PANEL
BUN: 21 mg/dL (ref 6–23)
Calcium: 9.3 mg/dL (ref 8.4–10.5)
Creatinine, Ser: 0.7 mg/dL (ref 0.4–1.2)
GFR: 96.78 mL/min (ref >60.00)
Glucose, Bld: 104 mg/dL — ABNORMAL HIGH (ref 70–99)
Potassium: 3.8 mEq/L (ref 3.5–5.1)

## 2012-12-21 LAB — CBC WITH DIFFERENTIAL/PLATELET
Basophils Absolute: 0 10*3/uL (ref 0.0–0.1)
Basophils Relative: 0.2 % (ref 0.0–3.0)
Eosinophils Absolute: 0 10*3/uL (ref 0.0–0.7)
Eosinophils Relative: 0 % (ref 0.0–5.0)
HCT: 43 % (ref 36.0–46.0)
Hemoglobin: 14.6 g/dL (ref 12.0–15.0)
Lymphs Abs: 0.8 10*3/uL (ref 0.7–4.0)
MCHC: 33.9 g/dL (ref 30.0–36.0)
MCV: 96.7 fl (ref 78.0–100.0)
Monocytes Absolute: 0.2 10*3/uL (ref 0.1–1.0)
Neutro Abs: 7.1 10*3/uL (ref 1.4–7.7)
Neutrophils Relative %: 87.3 % — ABNORMAL HIGH (ref 43.0–77.0)
RBC: 4.45 Mil/uL (ref 3.87–5.11)

## 2012-12-21 LAB — LIPID PANEL
Cholesterol: 239 mg/dL — ABNORMAL HIGH (ref 0–200)
Total CHOL/HDL Ratio: 2
Triglycerides: 50 mg/dL (ref 0.0–149.0)

## 2012-12-21 LAB — LDL CHOLESTEROL, DIRECT: Direct LDL: 126.9 mg/dL

## 2012-12-21 LAB — TSH: TSH: 0.54 u[IU]/mL (ref 0.35–5.50)

## 2012-12-21 MED ORDER — OMEPRAZOLE 40 MG PO CPDR: DELAYED_RELEASE_CAPSULE | ORAL | Status: DC

## 2012-12-21 MED ORDER — FEXOFENADINE HCL 180 MG PO TABS: 180.0000 mg | ORAL_TABLET | Freq: Every day | ORAL | Status: DC

## 2012-12-21 MED ORDER — BUPROPION HCL ER (XL) 150 MG PO TB24: 150.0000 mg | ORAL_TABLET | Freq: Every day | ORAL | Status: DC

## 2012-12-21 MED ORDER — LOSARTAN POTASSIUM-HCTZ 100-25 MG PO TABS: ORAL_TABLET | ORAL | Status: DC

## 2012-12-21 MED ORDER — ESCITALOPRAM OXALATE 20 MG PO TABS: 20.0000 mg | ORAL_TABLET | Freq: Every day | ORAL | Status: DC

## 2012-12-21 NOTE — Progress Notes (Signed)
Pre visit review using our clinic review tool, if applicable. No additional management support is needed unless otherwise documented below in the visit note. 

## 2012-12-21 NOTE — Progress Notes (Signed)
   Subjective:    Patient ID: Carol Quinn, female    DOB: 01-Jun-1955, 57 y.o.   MRN: [id number]  HPI CPE- UTD on pap (2013), mammo (GYN).  Due for DEXA and colonoscopy- received call back letter (Dr Leda Quail, [place]).   Review of Systems Patient reports no vision/ hearing changes, adenopathy,fever, weight change,  persistant/recurrent hoarseness , swallowing issues, palpitations, edema, hemoptysis, dyspnea (rest/exertional/paroxysmal nocturnal), gastrointestinal bleeding (melena, rectal bleeding), abdominal pain, significant heartburn, bowel changes, GU symptoms (dysuria, hematuria, incontinence), Gyn symptoms (abnormal  bleeding, pain),  syncope, focal weakness, memory loss, numbness & tingling, skin/hair/nail changes, abnormal bruising or bleeding, anxiety, or depression.  + persistent cough, following w/ Dr Maple Hudson + substernal chest discomfort- has been coughing x6 weeks, increased GERD, increased stress     Objective:   Physical Exam General Appearance:    Alert, cooperative, no distress, appears stated age  Head:    Normocephalic, without obvious abnormality, atraumatic  Eyes:    PERRL, conjunctiva/corneas clear, EOM's intact, fundi    benign, both eyes  Ears:    Normal TM's and external ear canals, both ears  Nose:   Nares normal, septum midline, mucosa normal, no drainage    or sinus tenderness  Throat:   Lips, mucosa, and tongue normal; teeth and gums normal  Neck:   Supple, symmetrical, trachea midline, no adenopathy;    Thyroid: no enlargement/tenderness/nodules  Back:     Symmetric, no curvature, ROM normal, no CVA tenderness  Lungs:     Clear to auscultation bilaterally, respirations unlabored  Chest Wall:    No tenderness or deformity   Heart:    Regular rate and rhythm, S1 and S2 normal, no murmur, rub   or gallop  Breast Exam:    Deferred to GYN  Abdomen:     Soft, non-tender, bowel sounds active all four quadrants,    no masses, no organomegaly  Genitalia:    Deferred  to GYN  Rectal:    Extremities:   Extremities normal, atraumatic, no cyanosis or edema  Pulses:   2+ and symmetric all extremities  Skin:   Skin color, texture, turgor normal, no rashes or lesions  Lymph nodes:   Cervical, supraclavicular, and axillary nodes normal  Neurologic:   CNII-XII intact, normal strength, sensation and reflexes    throughout          Assessment & Plan:

## 2012-12-21 NOTE — Assessment & Plan Note (Signed)
Pt's PE WNL.  UTD on mammo, pap.  Due for DEXA and colonoscopy- pt plans to schedule.  Check labs.  Anticipatory guidance provided.

## 2012-12-21 NOTE — Patient Instructions (Signed)
Follow up in 6 months to recheck BP No obvious cause of chest pain- try prilosec OTC for possible reflux We'll notify you of your lab results and make any changes if needed Keep up the good work!  You look great! Schedule your colonoscopy at your convenience Call with any questions or concerns Happy Holidays!!!

## 2012-12-21 NOTE — Assessment & Plan Note (Signed)
New.  Pt w/ known hx of RBBB and pulmonary disease.  Has had 6 weeks of cough, increased stress and GERD.  Suspect this is cause of pt's CP.  Discussed cards referral for stress test- pt not interested.  Reviewed supportive care and red flags that should prompt return.  Pt expressed understanding and is in agreement w/ plan.

## 2012-12-24 ENCOUNTER — Encounter: Payer: Self-pay | Admitting: General Practice

## 2012-12-25 LAB — VITAMIN D 1,25 DIHYDROXY
Vitamin D2 1, 25 (OH)2: <8 pg/mL
Vitamin D3 1, 25 (OH)2: 64 pg/mL

## 2012-12-26 ENCOUNTER — Encounter: Payer: Self-pay | Admitting: General Practice

## 2013-01-01 ENCOUNTER — Other Ambulatory Visit: Payer: Self-pay | Admitting: Internal Medicine

## 2013-01-01 NOTE — Telephone Encounter (Signed)
Received faxed refill request from [facility] for Nuvigil 250mg  Last refill 6.18.14 Last ov w/ CDY 11.6.14 Patient Instructions     Scripts printed for levaquin antibiotic and for tramadol for cough  Neb xop 0.63  Depo 80  Plenty of fluids, get rest and avoid getting chilled  Order- CXR- dx acute exacerbation of COPD   Dr Maple Hudson please advise if okay to refill, thank you.

## 2013-01-02 NOTE — Telephone Encounter (Signed)
Per CY-okay to refill x 5. I have called refill to pharmacy-spoke directly with pharmacy. Nothing more needed.

## 2013-04-16 ENCOUNTER — Telehealth: Payer: Self-pay | Admitting: Internal Medicine

## 2013-04-16 MED ORDER — TIOTROPIUM BROMIDE MONOHYDRATE 18 MCG IN CAPS: 18.0000 ug | ORAL_CAPSULE | Freq: Every day | RESPIRATORY_TRACT | Status: DC

## 2013-04-16 NOTE — Telephone Encounter (Signed)
Called made pt aware RX has been sent. Nothing further needed 

## 2013-05-08 ENCOUNTER — Encounter: Payer: Self-pay | Admitting: Family Medicine

## 2013-05-08 ENCOUNTER — Ambulatory Visit (INDEPENDENT_AMBULATORY_CARE_PROVIDER_SITE_OTHER): Payer: 59 | Admitting: Family Medicine

## 2013-05-08 VITALS — BP 110/72 | HR 79 | Temp 98.2°F | Wt 137.0 lb

## 2013-05-08 DIAGNOSIS — J441 Chronic obstructive pulmonary disease with (acute) exacerbation: Secondary | ICD-10-CM

## 2013-05-08 MED ORDER — PREDNISONE 10 MG PO TABS: ORAL_TABLET | ORAL | Status: DC

## 2013-05-08 MED ORDER — LEVOFLOXACIN 500 MG PO TABS: 500.0000 mg | ORAL_TABLET | Freq: Every day | ORAL | Status: DC

## 2013-05-08 MED ORDER — IPRATROPIUM-ALBUTEROL 0.5-2.5 (3) MG/3ML IN SOLN
3.0000 mL | Freq: Once | RESPIRATORY_TRACT | Status: AC
  Administered 2013-05-08: 3 mL via RESPIRATORY_TRACT

## 2013-05-08 MED ORDER — IPRATROPIUM-ALBUTEROL 0.5-2.5 (3) MG/3ML IN SOLN: 3.0000 mL | Freq: Four times a day (QID) | RESPIRATORY_TRACT | Status: DC | PRN

## 2013-05-08 MED ORDER — METHYLPREDNISOLONE ACETATE 80 MG/ML IJ SUSP
80.0000 mg | Freq: Once | INTRAMUSCULAR | Status: AC
  Administered 2013-05-08: 80 mg via INTRAMUSCULAR

## 2013-05-08 NOTE — Patient Instructions (Signed)
Chronic Obstructive Pulmonary Disease  Chronic obstructive pulmonary disease (COPD) is a common lung condition in which airflow from the lungs is limited. COPD is a general term that can be used to describe many different lung problems that limit airflow, including both chronic bronchitis and emphysema.  If you have COPD, your lung function will probably never return to normal, but there are measures you can take to improve lung function and make yourself feel better.   CAUSES   · Smoking (common).    · Exposure to secondhand smoke.    · Genetic problems.  · Chronic inflammatory lung diseases or recurrent infections.  SYMPTOMS   · Shortness of breath, especially with physical activity.    · Deep, persistent (chronic) cough with a large amount of thick mucus.    · Wheezing.    · Rapid breaths (tachypnea).    · Gray or bluish discoloration (cyanosis) of the skin, especially in fingers, toes, or lips.    · Fatigue.    · Weight loss.    · Frequent infections or episodes when breathing symptoms become much worse (exacerbations).    · Chest tightness.  DIAGNOSIS   Your healthcare provider will take a medical history and perform a physical examination to make the initial diagnosis.  Additional tests for COPD may include:   · Lung (pulmonary) function tests.  · Chest X-ray.  · CT scan.  · Blood tests.  TREATMENT   Treatment available to help you feel better when you have COPD include:   · Inhaler and nebulizer medicines. These help manage the symptoms of COPD and make your breathing more comfortable  · Supplemental oxygen. Supplemental oxygen is only helpful if you have a low oxygen level in your blood.    · Exercise and physical activity. These are beneficial for nearly all people with COPD. Some people may also benefit from a pulmonary rehabilitation program.  HOME CARE INSTRUCTIONS   · Take all medicines (inhaled or pills) as directed by your health care provider.  · Only take over-the-counter or prescription medicines  for pain, fever, or discomfort as directed by your health care provider.    · Avoid over-the-counter medicines or cough syrups that dry up your airway (such as antihistamines) and slow down the elimination of secretions unless instructed otherwise by your healthcare provider.    · If you are a smoker, the most important thing that you can do is stop smoking. Continuing to smoke will cause further lung damage and breathing trouble. Ask your health care provider for help with quitting smoking. He or she can direct you to community resources or hospitals that provide support.  · Avoid exposure to irritants such as smoke, chemicals, and fumes that aggravate your breathing.  · Use oxygen therapy and pulmonary rehabilitation if directed by your health care provider. If you require home oxygen therapy, ask your healthcare provider whether you should purchase a pulse oximeter to measure your oxygen level at home.    · Avoid contact with individuals who have a contagious illness.  · Avoid extreme temperature and humidity changes.  · Eat healthy foods. Eating smaller, more frequent meals and resting before meals may help you maintain your strength.  · Stay active, but balance activity with periods of rest. Exercise and physical activity will help you maintain your ability to do things you want to do.  · Preventing infection and hospitalization is very important when you have COPD. Make sure to receive all the vaccines your health care provider recommends, especially the pneumococcal and influenza vaccines. Ask your healthcare provider whether you   need a pneumonia vaccine.  · Learn and use relaxation techniques to manage stress.  · Learn and use controlled breathing techniques as directed by your health care provider. Controlled breathing techniques include:    · Pursed lip breathing. Start by breathing in (inhaling) through your nose for 1 second. Then, purse your lips as if you were going to whistle and breathe out (exhale)  through the pursed lips for 2 seconds.    · Diaphragmatic breathing. Start by putting one hand on your abdomen just above your waist. Inhale slowly through your nose. The hand on your abdomen should move out. Then purse your lips and exhale slowly. You should be able to feel the hand on your abdomen moving in as you exhale.    · Learn and use controlled coughing to clear mucus from your lungs. Controlled coughing is a series of short, progressive coughs. The steps of controlled coughing are:    1. Lean your head slightly forward.    2. Breathe in deeply using diaphragmatic breathing.    3. Try to hold your breath for 3 seconds.    4. Keep your mouth slightly open while coughing twice.    5. Spit any mucus out into a tissue.    6. Rest and repeat the steps once or twice as needed.  SEEK MEDICAL CARE IF:   · You are coughing up more mucus than usual.    · There is a change in the color or thickness of your mucus.    · Your breathing is more labored than usual.    · Your breathing is faster than usual.    SEEK IMMEDIATE MEDICAL CARE IF:   · You have shortness of breath while you are resting.    · You have shortness of breath that prevents you from:  · Being able to talk.    · Performing your usual physical activities.    · You have chest pain lasting longer than 5 minutes.    · Your skin color is more cyanotic than usual.  · You measure low oxygen saturations for longer than 5 minutes with a pulse oximeter.  MAKE SURE YOU:   · Understand these instructions.  · Will watch your condition.  · Will get help right away if you are not doing well or get worse.  Document Released: 10/06/2004 Document Revised: 10/17/2012 Document Reviewed: 08/23/2012  Exit[employer]® Patient Information ©2014 [employer].

## 2013-05-08 NOTE — Addendum Note (Signed)
Addended by: Ewing Schlein on: 05/08/2013 12:02 PM   Modules accepted: Orders

## 2013-05-08 NOTE — Progress Notes (Signed)
Pre visit review using our clinic review tool, if applicable. No additional management support is needed unless otherwise documented below in the visit note. 

## 2013-05-08 NOTE — Progress Notes (Signed)
  Subjective:     Carol Quinn is a 58 y.o. female here for evaluation of a cough. Onset of symptoms was several days ago. Symptoms have been gradually worsening since that time. The cough is productive and is aggravated by infection, pollens and reclining position. Associated symptoms include: shortness of breath, sputum production and wheezing. Patient does not have a history of asthma.--+ copd.  Patient does not have a history of environmental allergens. Patient has not traveled recently. Patient does have a history of smoking. Patient has not had a previous chest x-ray. Patient has not had a PPD done.  The following portions of the patient's history were reviewed and updated as appropriate: allergies, current medications, past family history, past medical history, past social history, past surgical history and problem list.  Review of Systems Pertinent items are noted in HPI.    Objective:    Oxygen saturation 99% on room air BP 110/72  Pulse 79  Temp(Src) 98.2 F (36.8 C) (Oral)  Wt 137 lb (62.143 kg)  SpO2 99% General appearance: alert, cooperative, appears stated age and no distress Ears: normal TM's and external ear canals both ears Nose: Nares normal. Septum midline. Mucosa normal. No drainage or sinus tenderness. Throat: lips, mucosa, and tongue normal; teeth and gums normal Neck: no adenopathy, supple, symmetrical, trachea midline and thyroid not enlarged, symmetric, no tenderness/mass/nodules Back: symmetric, no curvature. ROM normal. No CVA tenderness. Lungs: rhonchi bilaterally and wheezes bilaterally Heart: S1, S2 normal Extremities: extremities normal, atraumatic, no cyanosis or edema    Assessment:    COPD with exacerbation    Plan:    Antibiotics per medication orders. Avoid exposure to tobacco smoke and fumes. B-agonist inhaler. Call if shortness of breath worsens, blood in sputum, change in character of cough, development of fever or chills, inability to  maintain nutrition and hydration. Avoid exposure to tobacco smoke and fumes. depo medrol and pred taper

## 2013-05-17 ENCOUNTER — Encounter: Payer: Self-pay | Admitting: Family Medicine

## 2013-05-17 ENCOUNTER — Telehealth: Payer: Self-pay | Admitting: Family Medicine

## 2013-05-17 ENCOUNTER — Ambulatory Visit (HOSPITAL_BASED_OUTPATIENT_CLINIC_OR_DEPARTMENT_OTHER)
Admission: RE | Admit: 2013-05-17 | Discharge: 2013-05-17 | Disposition: A | Discharge status: Home / Self Care | Payer: 59 | Source: Ambulatory Visit | Attending: Family Medicine | Admitting: Family Medicine

## 2013-05-17 ENCOUNTER — Ambulatory Visit (INDEPENDENT_AMBULATORY_CARE_PROVIDER_SITE_OTHER): Payer: 59 | Admitting: Family Medicine

## 2013-05-17 VITALS — BP 112/80 | HR 101 | Temp 98.0°F | Resp 16 | Wt 133.2 lb

## 2013-05-17 DIAGNOSIS — F17200 Nicotine dependence, unspecified, uncomplicated: Secondary | ICD-10-CM | POA: Insufficient documentation

## 2013-05-17 DIAGNOSIS — J449 Chronic obstructive pulmonary disease, unspecified: Secondary | ICD-10-CM

## 2013-05-17 DIAGNOSIS — R059 Cough, unspecified: Secondary | ICD-10-CM | POA: Insufficient documentation

## 2013-05-17 DIAGNOSIS — R2689 Other abnormalities of gait and mobility: Secondary | ICD-10-CM | POA: Insufficient documentation

## 2013-05-17 DIAGNOSIS — R05 Cough: Secondary | ICD-10-CM | POA: Insufficient documentation

## 2013-05-17 DIAGNOSIS — J4489 Other specified chronic obstructive pulmonary disease: Secondary | ICD-10-CM | POA: Insufficient documentation

## 2013-05-17 DIAGNOSIS — R29818 Other symptoms and signs involving the nervous system: Secondary | ICD-10-CM

## 2013-05-17 LAB — CBC WITH DIFFERENTIAL/PLATELET
Basophils Absolute: 0.1 10*3/uL (ref 0.0–0.1)
Basophils Relative: 1 % (ref 0–1)
EOS ABS: 0.1 10*3/uL (ref 0.0–0.7)
Eosinophils Relative: 1 % (ref 0–5)
HCT: 45.4 % (ref 36.0–46.0)
HEMOGLOBIN: 15.8 g/dL — AB (ref 12.0–15.0)
LYMPHS ABS: 1.3 10*3/uL (ref 0.7–4.0)
LYMPHS PCT: 12 % (ref 12–46)
MCH: 32.2 pg (ref 26.0–34.0)
MCHC: 34.8 g/dL (ref 30.0–36.0)
MCV: 92.5 fL (ref 78.0–100.0)
Monocytes Absolute: 0.3 10*3/uL (ref 0.1–1.0)
Monocytes Relative: 3 % (ref 3–12)
NEUTROS PCT: 83 % — AB (ref 43–77)
Neutro Abs: 8.7 10*3/uL — ABNORMAL HIGH (ref 1.7–7.7)
Platelets: 396 10*3/uL (ref 150–400)
RBC: 4.91 MIL/uL (ref 3.87–5.11)
RDW: 13.1 % (ref 11.5–15.5)
WBC: 10.5 10*3/uL (ref 4.0–10.5)

## 2013-05-17 MED ORDER — MONTELUKAST SODIUM 10 MG PO TABS: 10.0000 mg | ORAL_TABLET | Freq: Every day | ORAL | Status: DC

## 2013-05-17 NOTE — Telephone Encounter (Signed)
Noted.  See imaging note

## 2013-05-17 NOTE — Telephone Encounter (Signed)
Pt.notified

## 2013-05-17 NOTE — Progress Notes (Signed)
   Subjective:    Patient ID: Carol Quinn, female    DOB: Feb 07, 1955, 58 y.o.   MRN: [id number]  HPI COPD- pt was seen by Dr Etter Sjogren for COPD exacerbation.  Treated w/ steroid taper and abx.  Pt reports that for last 2 yrs 'it's been 1 respiratory infection after another'.  Pt continues to smoke.  Has pulmonologist- Young.  On Spiriva, Duonebs, Symbicort, Albuterol.  Son feels that there may be black mold present in house or work.  On Allegra.  Balance issues- pt reports 'i stumble all the time.  Like i'm drunk but not drunk'.    On the way out, pt stopped me in the hall and said 'make sure you write down that I'm confused' and kept walking   Review of Systems For ROS see HPI     Objective:   Physical Exam  Vitals reviewed. Constitutional: She is oriented to person, place, and time. She appears well-developed and well-nourished. No distress.  HENT:  Head: Normocephalic and atraumatic.  TMs normal bilaterally Mild nasal congestion Throat w/out erythema, edema, or exudate  Eyes: Conjunctivae and EOM are normal. Pupils are equal, round, and reactive to light.  Neck: Normal range of motion. Neck supple.  Cardiovascular: Normal rate, regular rhythm, normal heart sounds and intact distal pulses.   No murmur heard. Pulmonary/Chest: Effort normal and breath sounds normal. No respiratory distress. She has no wheezes. She has no rales.  Lymphadenopathy:    She has no cervical adenopathy.  Neurological: She is alert and oriented to person, place, and time. She has normal reflexes. No cranial nerve deficit. Coordination normal.  Psychiatric:  Pt's thought process very difficult to follow today- she kept veering off topic and jumping from idea to idea          Assessment & Plan:

## 2013-05-17 NOTE — Telephone Encounter (Signed)
Carol Quinn asked that I cancel the pulmonary appointment I made for her at [facility]. She states she would like to hold off until she gets her test results. She states she does not want to "burn any bridges" with Dr. Annamaria Boots. She is requesting that when her chest xray results are back, we leave a detailed message on her vm if she doesn't answer. Carol Quinn also states if she needs medication she would like it sent to [facility].

## 2013-05-17 NOTE — Progress Notes (Signed)
Pre visit review using our clinic review tool, if applicable. No additional management support is needed unless otherwise documented below in the visit note. 

## 2013-05-17 NOTE — Patient Instructions (Signed)
Follow up as needed Start the Singulair daily in addition to Luther your inhalers as directed [facility] to [facility] to get [facility] We'll call you with your pulmonary appt We'll notify you of your lab results and make any changes if needed You can call the EPA and get your house checked for mold Call with any questions or concerns Hang in there!!!

## 2013-05-18 LAB — BASIC METABOLIC PANEL
BUN: 19 mg/dL (ref 6–23)
CO2: 26 meq/L (ref 19–32)
Calcium: 9.1 mg/dL (ref 8.4–10.5)
Chloride: 98 mEq/L (ref 96–112)
Creat: 0.68 mg/dL (ref 0.50–1.10)
Glucose, Bld: 88 mg/dL (ref 70–99)
Potassium: 3.7 mEq/L (ref 3.5–5.3)
SODIUM: 136 meq/L (ref 135–145)

## 2013-05-18 LAB — FOLATE: Folate: 6.4 ng/mL

## 2013-05-18 LAB — VITAMIN B12: VITAMIN B 12: 489 pg/mL (ref 211–911)

## 2013-05-18 LAB — TSH: TSH: 0.68 u[IU]/mL (ref 0.350–4.500)

## 2013-05-19 NOTE — Assessment & Plan Note (Addendum)
New.  No evidence on PE today but pt reports this is an issue.  Will check labs to r/o metabolic causes.  Now coupled w/ pt's 'by the way' statement about confusion will refer to neuro

## 2013-05-19 NOTE — Assessment & Plan Note (Addendum)
Chronic problem, sees Pulmonary regularly but is considering a 2nd opinion.  Get CXR to assess- start abx if needed.  Start singulair.  Refer to pulmonary.  Will follow.

## 2013-05-21 ENCOUNTER — Telehealth: Payer: Self-pay | Admitting: Family Medicine

## 2013-05-21 ENCOUNTER — Encounter: Payer: Self-pay | Admitting: General Practice

## 2013-05-21 ENCOUNTER — Other Ambulatory Visit: Payer: Self-pay | Admitting: Emergency Medicine

## 2013-05-21 MED ORDER — BUDESONIDE-FORMOTEROL FUMARATE 80-4.5 MCG/ACT IN AERO: 2.0000 | INHALATION_SPRAY | Freq: Two times a day (BID) | RESPIRATORY_TRACT | Status: DC

## 2013-05-21 NOTE — Telephone Encounter (Signed)
Pt called to speak with [place]

## 2013-05-21 NOTE — Telephone Encounter (Signed)
Will need to make an office visit with Dr. Annamaria Boots for future medication refills.

## 2013-05-22 NOTE — Telephone Encounter (Signed)
Left message for patient to call office.  

## 2013-06-21 ENCOUNTER — Ambulatory Visit: Payer: 59 | Admitting: Family Medicine

## 2013-06-21 ENCOUNTER — Ambulatory Visit: Payer: 59 | Admitting: Neurology

## 2013-07-17 ENCOUNTER — Telehealth: Payer: Self-pay | Admitting: Internal Medicine

## 2013-07-17 MED ORDER — BUDESONIDE-FORMOTEROL FUMARATE 80-4.5 MCG/ACT IN AERO: 2.0000 | INHALATION_SPRAY | Freq: Two times a day (BID) | RESPIRATORY_TRACT | Status: DC

## 2013-07-17 NOTE — Telephone Encounter (Signed)
lmomtcb x1 for pt 

## 2013-07-18 ENCOUNTER — Telehealth: Payer: Self-pay | Admitting: Internal Medicine

## 2013-07-18 ENCOUNTER — Other Ambulatory Visit: Payer: Self-pay | Admitting: Family Medicine

## 2013-07-18 MED ORDER — ARMODAFINIL 250 MG PO TABS: 250.0000 mg | ORAL_TABLET | Freq: Every day | ORAL | Status: DC

## 2013-07-18 MED ORDER — MOMETASONE FURO-FORMOTEROL FUM 100-5 MCG/ACT IN AERO: 2.0000 | INHALATION_SPRAY | Freq: Two times a day (BID) | RESPIRATORY_TRACT | Status: DC

## 2013-07-18 NOTE — Telephone Encounter (Signed)
Called and spoke with pt and she is aware of CY recs and she stated that she will  Call back to schedule an appt with CY.  nuvigil has been called to the pharmacy for refills and the pharmacy will d/c the symbicort.

## 2013-07-18 NOTE — Telephone Encounter (Signed)
Spoke with pt. She is requesting a refill on nuvigil Last refilled 06/27/12 #30 x 5 refills Last OV 11/15/12 No pending appt.  I also called [facility]. The symbicort is no longer covered. Pt must 1st try/fail QVAR, advair diskus, dulera, alvesco.  Please advise Dr. Annamaria Boots thanks  Allergies  Allergen Reactions  . Clarithromycin     Rash   . Codeine   . Hydrocodone-Homatropine     paranoia  . Penicillins      Current Outpatient Prescriptions on File Prior to Visit  Medication Sig Dispense Refill  . albuterol (PROVENTIL HFA;VENTOLIN HFA) 108 (90 BASE) MCG/ACT inhaler Inhale 2 puffs into the lungs every 4 (four) hours as needed for wheezing.  1 Inhaler  prn  . Armodafinil (NUVIGIL) 250 MG tablet Take 1 tablet (250 mg total) by mouth daily.  30 tablet  5  . budesonide-formoterol (SYMBICORT) 80-4.5 MCG/ACT inhaler Inhale 2 puffs into the lungs 2 (two) times daily.  1 Inhaler  1  . buPROPion (WELLBUTRIN XL) 150 MG 24 hr tablet TAKE 1 TABLET BY MOUTH DAILY  30 tablet  2  . escitalopram (LEXAPRO) 20 MG tablet Take 1 tablet (20 mg total) by mouth daily.  30 tablet  6  . estrogens conjugated, synthetic B, (ENJUVIA) 0.625 MG tablet Take 0.625 mg by mouth daily.        . fexofenadine (ALLEGRA) 180 MG tablet Take 1 tablet (180 mg total) by mouth daily.  30 tablet  5  . ipratropium-albuterol (DUONEB) 0.5-2.5 (3) MG/3ML SOLN Take 3 mLs by nebulization every 6 (six) hours as needed.  360 mL  5  . levofloxacin (LEVAQUIN) 500 MG tablet Take 1 tablet (500 mg total) by mouth daily.  7 tablet  0  . losartan-hydrochlorothiazide (HYZAAR) 100-25 MG per tablet TAKE 1 TABLET BY MOUTH DAILY  30 tablet  5  . montelukast (SINGULAIR) 10 MG tablet Take 1 tablet (10 mg total) by mouth at bedtime.  30 tablet  3  . omeprazole (PRILOSEC) 40 MG capsule TAKE 1 CAPSULE BY MOUTH ONCE DAILY.  30 capsule  5  . pentoxifylline (TRENTAL) 400 MG CR tablet Take 1 tablet by mouth Twice daily.      . predniSONE (DELTASONE) 10 MG  tablet 3 po qd for 3 days then 2 po qd for 3 days the 1 po qd for 3 days  18 tablet  0  . SUMAtriptan (IMITREX) 100 MG tablet Take as needed for migraines.       . tiotropium (SPIRIVA) 18 MCG inhalation capsule Place 1 capsule (18 mcg total) into inhaler and inhale daily.  30 capsule  5  . zolmitriptan (ZOMIG) 5 MG nasal solution Take as directed as needed for migraines        No current facility-administered medications on file prior to visit.

## 2013-07-18 NOTE — Telephone Encounter (Signed)
Pt returning call.Carol Quinn ° °

## 2013-07-18 NOTE — Telephone Encounter (Signed)
Ok to refill Nuvigil as before. I need to see her once a year.  Ok to change from Symbicort to [facility] 100, 2 puffs then rinse mouth, twice daily   # 1, ref prn

## 2013-07-18 NOTE — Telephone Encounter (Signed)
Called spoke with pt. Aware RX has been sent. Nothing further needed 

## 2013-07-18 NOTE — Telephone Encounter (Signed)
Refill Request:  buPROPion (WELLBUTRIN XL) 150 MG 24 hr tablet--Take 1 tablet (150 mg total) by mouth daily.  Last Filled:  12/21/13 Amt Filled:  #30, 5 refills Last OV:  05/17/13  Med fill per Prescription Refill Protocol---Pt made aware.  No other needs voiced at this time.

## 2013-08-02 ENCOUNTER — Ambulatory Visit (INDEPENDENT_AMBULATORY_CARE_PROVIDER_SITE_OTHER): Payer: 59 | Admitting: Neurology

## 2013-08-02 ENCOUNTER — Encounter: Payer: Self-pay | Admitting: Neurology

## 2013-08-02 VITALS — BP 112/62 | HR 74 | Ht 62.0 in | Wt 134.0 lb

## 2013-08-02 DIAGNOSIS — R2681 Unsteadiness on feet: Secondary | ICD-10-CM

## 2013-08-02 DIAGNOSIS — R269 Unspecified abnormalities of gait and mobility: Secondary | ICD-10-CM

## 2013-08-02 DIAGNOSIS — R4189 Other symptoms and signs involving cognitive functions and awareness: Secondary | ICD-10-CM

## 2013-08-02 DIAGNOSIS — F09 Unspecified mental disorder due to known physiological condition: Secondary | ICD-10-CM

## 2013-08-02 NOTE — Patient Instructions (Signed)
1.  I don't see any abnormalities on your exam today, which is reassuring.  This may be related to stress but I would rule out other causes first.  We will get an MRI of the brain 2.  Other considerations could be neuropsychological testing, if this is something you would like to pursue in the future 3.  Smoking cessation 4.  We will call you with MRI results and whether any further testing is necessary

## 2013-08-02 NOTE — Progress Notes (Signed)
NEUROLOGY CONSULTATION NOTE  KAEDE CLENDENEN MRN: [id number] DOB: 08/19/1955  Referring provider: Dr. Birdie Riddle Primary care provider: Dr. Birdie Riddle  Reason for consult:  Confusion, unsteady gait.  HISTORY OF PRESENT ILLNESS: Carol Quinn is a 58 year old right-handed woman with COPD, hypertension, IBS, GERD, depression tobacco abuse and migraine who presents with confusion and balance problems.  Records reviewed.  She reports multiple health issues over the past couple of years, such as multiple respiratory infections related to sit her COPD, as well as chronic dry eyes and dry mouth (she was tested for Sjgren's which was negative), as well as episodes of confusion and unsteady gait. Sometimes when she walks, she will cyst suddenly stumbled for moment. She is never had any falls but has had to catch herself a couple of times. There is no associated dizziness, lower 70 weakness, or difficulty feeling the ground. It just happens without warning. She also notes episodes of confusion. She works at [employer]. At work, she will sometimes forget how to do everyday tasks such as how to avoid to check. When she is at home, she sometimes has trouble figuring out recipe that she has used for quite some time. During these periods, a little things would greatly upset her, such as somebody tailgate in her while she drives. There is been occasions where she will become disoriented while driving and not remember how to get some place on familiar routes, such as the grocery store. During these times, she will begin crying and she'll have to call her husband to correct her home. Usually these episodes of confusion resolved when she starts to calm down. The next day, she feels extreme lethargy, similar to how she felt when she would get a migraine. She also feels better after she starts drinking water as she is prone to dehydration. At one point, she suspected that her symptoms may be due to mold. She did find out  that her house had some old and this was recently removed.  05/17/13 LABS:  WBC 10.5, HGB 15.8, HCT 45.4, PLT 396, Na 136, K 3.7, BUN 19, Cr 0.68, TSH 0.680, B12 489, folate 6.4  PAST MEDICAL HISTORY: Past Medical History  Diagnosis Date  . Childhood asthma   . GERD (gastroesophageal reflux disease)   . Allergic rhinitis   . Hypertension   . RBBB (right bundle branch block with left anterior fascicular block)   . Exposure to TB     uncle-her PPD neg  . Chronic headaches   . Acute bronchitis   . Complication of anesthesia     woke up during surgery x 1  . Complication of anesthesia     hard to wake up x 1  . COPD (chronic obstructive pulmonary disease)     emphysema    PAST SURGICAL HISTORY: Past Surgical History  Procedure Laterality Date  . Total abdominal hysterectomy    . Tubal ligation    . Breast biopsy  2002  . Nose surgery      "nasal deviation"    MEDICATIONS: Current Outpatient Prescriptions on File Prior to Visit  Medication Sig Dispense Refill  . albuterol (PROVENTIL HFA;VENTOLIN HFA) 108 (90 BASE) MCG/ACT inhaler Inhale 2 puffs into the lungs every 4 (four) hours as needed for wheezing.  1 Inhaler  prn  . Armodafinil (NUVIGIL) 250 MG tablet Take 1 tablet (250 mg total) by mouth daily.  30 tablet  5  . buPROPion (WELLBUTRIN XL) 150 MG 24 hr tablet TAKE  1 TABLET BY MOUTH DAILY  30 tablet  2  . escitalopram (LEXAPRO) 20 MG tablet Take 1 tablet (20 mg total) by mouth daily.  30 tablet  6  . fexofenadine (ALLEGRA) 180 MG tablet Take 1 tablet (180 mg total) by mouth daily.  30 tablet  5  . losartan-hydrochlorothiazide (HYZAAR) 100-25 MG per tablet TAKE 1 TABLET BY MOUTH DAILY  30 tablet  5  . mometasone-formoterol (DULERA) 100-5 MCG/ACT AERO Inhale 2 puffs into the lungs 2 (two) times daily.  1 Inhaler  prn  . montelukast (SINGULAIR) 10 MG tablet Take 1 tablet (10 mg total) by mouth at bedtime.  30 tablet  3  . omeprazole (PRILOSEC) 40 MG capsule TAKE 1 CAPSULE BY  MOUTH ONCE DAILY.  30 capsule  5  . tiotropium (SPIRIVA) 18 MCG inhalation capsule Place 1 capsule (18 mcg total) into inhaler and inhale daily.  30 capsule  5  . zolmitriptan (ZOMIG) 5 MG nasal solution Take as directed as needed for migraines       . ipratropium-albuterol (DUONEB) 0.5-2.5 (3) MG/3ML SOLN Take 3 mLs by nebulization every 6 (six) hours as needed.  360 mL  5  . SUMAtriptan (IMITREX) 100 MG tablet Take as needed for migraines.        No current facility-administered medications on file prior to visit.    ALLERGIES: Allergies  Allergen Reactions  . Clarithromycin     Rash   . Codeine   . Hydrocodone-Homatropine     paranoia  . Penicillins     FAMILY HISTORY: Family History  Problem Relation Age of Onset  . Emphysema Mother   . Heart attack Mother   . Heart failure Father   . Asthma Sister   . Asthma Maternal Uncle   . Arthritis Maternal Grandmother   . Cancer Maternal Grandmother   . Cancer Sister   . Cancer      aunts  . Cancer      uncles    SOCIAL HISTORY: History   Social History  . Marital Status: Married    Spouse Name: N/A    Number of Children: N/A  . Years of Education: N/A   Occupational History  . accounting tech     [employer]   Social History Main Topics  . Smoking status: Current Every Day Smoker -- 0.80 packs/day for 40 years    Types: Cigarettes  . Smokeless tobacco: Never Used     Comment: Bad day 6-7 cigs a day  . Alcohol Use: No  . Drug Use: Not on file  . Sexual Activity: Not on file   Other Topics Concern  . Not on file   Social History Narrative  . No narrative on file    REVIEW OF SYSTEMS: Constitutional: No fevers, chills, or sweats, no generalized fatigue, change in appetite Eyes: No visual changes, double vision, eye pain Ear, nose and throat: No hearing loss, ear pain, nasal congestion, sore throat Cardiovascular: No chest pain, palpitations Respiratory:  No shortness of breath at rest  or with exertion, wheezes GastrointestinaI: No nausea, vomiting, diarrhea, abdominal pain, fecal incontinence Genitourinary:  No dysuria, urinary retention or frequency Musculoskeletal:  No neck pain, back pain Integumentary: No rash, pruritus, skin lesions Neurological: as above Psychiatric: No depression, insomnia, anxiety Endocrine: No palpitations, fatigue, diaphoresis, mood swings, change in appetite, change in weight, increased thirst Hematologic/Lymphatic:  No anemia, purpura, petechiae. Allergic/Immunologic: no itchy/runny eyes, nasal congestion, recent allergic reactions, rashes  PHYSICAL  EXAMDanley Danker Vitals:   08/02/13 1409  BP: 112/62  Pulse: 74   General: No acute distress Head:  Normocephalic/atraumatic Neck: supple, no paraspinal tenderness, full range of motion Back: No paraspinal tenderness Heart: regular rate and rhythm Lungs: Clear to auscultation bilaterally. Vascular: No carotid bruits. Neurological Exam: Mental status:  Montreal Cognitive Assessment  08/02/2013  Visuospatial/ Executive (0/5) 5  Naming (0/3) 3  Attention: Read list of digits (0/2) 2  Attention: Read list of letters (0/1) 0  Attention: Serial 7 subtraction starting at 100 (0/3) 3  Language: Repeat phrase (0/2) 2  Language : Fluency (0/1) 1  Abstraction (0/2) 2  Delayed Recall (0/5) 4  Orientation (0/6) 6  Total 28  Adjusted Score (based on education) 28   remote memory intact, fund of knowledge intact, speech fluent and not dysarthric, Cranial nerves: CN I: not tested CN II: pupils equal, round and reactive to light, visual fields intact, fundi unremarkable, without vessel changes, exudates, hemorrhages or papilledema. CN III, IV, VI:  full range of motion, no nystagmus, no ptosis CN V: facial sensation intact CN VII: upper and lower face symmetric CN VIII: hearing intact CN IX, X: gag intact, uvula midline CN XI: sternocleidomastoid and trapezius muscles intact CN XII: tongue  midline Bulk & Tone: normal, no fasciculations. Motor: 5 out of 5 throughout Sensation: Pinprick and vibration intact Deep Tendon Reflexes: 3+ throughout, toes downgoing Finger to nose testing: No dysmetria Heel to shin: No dysmetria Gait: Normal station and stride. Able to turn, walk on toes, walk on heels, and in tandem. Romberg negative.  IMPRESSION: Cognitive deficits.  No appreciable cognitive deficits noted on MoCA.   Unsteady gait.  Exam is normal today, although it doesn't happen all the time. This may be related to stress, but that would be a diagnosis of exclusion.  Therefore, an intracranial etiology for confusion and unsteady gait should be ruled out first.  PLAN: MRI Brain Further testing pending results of MRI Smoking cessation  45 minutes spent with patient, over 50% spent counseling and coordinating care.  Thank you for allowing me to take part in the care of this patient.  Metta Clines, DO  CC:  Annye Asa, MD

## 2013-08-16 ENCOUNTER — Ambulatory Visit (INDEPENDENT_AMBULATORY_CARE_PROVIDER_SITE_OTHER): Payer: 59 | Admitting: Family Medicine

## 2013-08-16 ENCOUNTER — Encounter: Payer: Self-pay | Admitting: Family Medicine

## 2013-08-16 VITALS — BP 124/80 | HR 78 | Temp 98.0°F | Resp 16 | Wt 136.0 lb

## 2013-08-16 DIAGNOSIS — I10 Essential (primary) hypertension: Secondary | ICD-10-CM

## 2013-08-16 LAB — BASIC METABOLIC PANEL
BUN: 14 mg/dL (ref 6–23)
CALCIUM: 9.3 mg/dL (ref 8.4–10.5)
CHLORIDE: 101 meq/L (ref 96–112)
CO2: 33 meq/L — AB (ref 19–32)
Creatinine, Ser: 0.6 mg/dL (ref 0.4–1.2)
GFR: 118.75 mL/min (ref >60.00)
GLUCOSE: 106 mg/dL — AB (ref 70–99)
Potassium: 3.2 mEq/L — ABNORMAL LOW (ref 3.5–5.1)
Sodium: 139 mEq/L (ref 135–145)

## 2013-08-16 NOTE — Progress Notes (Signed)
Pre visit review using our clinic review tool, if applicable. No additional management support is needed unless otherwise documented below in the visit note. 

## 2013-08-16 NOTE — Patient Instructions (Signed)
Schedule your complete physical in 6 months We'll notify you of your lab results and make any changes if needed Your BP is great!  Keep up the good work! Call with any questions or concerns Try and enjoy the rest of your summer!!!

## 2013-08-16 NOTE — Progress Notes (Signed)
   Subjective:    Patient ID: Carol Quinn, female    DOB: 09/20/1955, 58 y.o.   MRN: [id number]  HPI HTN- chronic problem, well controlled today on Hyzaar.  Denies CP, SOB, HAs, visual changes, edema.   Review of Systems For ROS see HPI     Objective:   Physical Exam  Vitals reviewed. Constitutional: She is oriented to person, place, and time. She appears well-developed and well-nourished. No distress.  HENT:  Head: Normocephalic and atraumatic.  Eyes: Conjunctivae and EOM are normal. Pupils are equal, round, and reactive to light.  Neck: Normal range of motion. Neck supple. No thyromegaly present.  Cardiovascular: Normal rate, regular rhythm, normal heart sounds and intact distal pulses.   No murmur heard. Pulmonary/Chest: Effort normal and breath sounds normal. No respiratory distress.  Abdominal: Soft. She exhibits no distension. There is no tenderness.  Musculoskeletal: She exhibits no edema.  Lymphadenopathy:    She has no cervical adenopathy.  Neurological: She is alert and oriented to person, place, and time.  Skin: Skin is warm and dry.  Psychiatric: She has a normal mood and affect. Her behavior is normal.          Assessment & Plan:

## 2013-08-18 NOTE — Assessment & Plan Note (Signed)
Chronic problem.  Well controlled on Hyzaar.  Check BMP.  Adjust meds prn.

## 2013-08-19 ENCOUNTER — Telehealth: Payer: Self-pay | Admitting: Family Medicine

## 2013-08-19 NOTE — Telephone Encounter (Signed)
Relevant patient education assigned to patient using Emmi. ° °

## 2013-08-20 ENCOUNTER — Other Ambulatory Visit: Payer: Self-pay | Admitting: General Practice

## 2013-08-20 MED ORDER — POTASSIUM CHLORIDE CRYS ER 20 MEQ PO TBCR: 20.0000 meq | EXTENDED_RELEASE_TABLET | Freq: Every day | ORAL | Status: DC

## 2013-08-21 ENCOUNTER — Ambulatory Visit (HOSPITAL_COMMUNITY): Payer: 59

## 2013-08-26 ENCOUNTER — Other Ambulatory Visit (HOSPITAL_COMMUNITY): Payer: Self-pay | Admitting: Obstetrics and Gynecology

## 2013-08-26 ENCOUNTER — Other Ambulatory Visit: Payer: Self-pay | Admitting: Family Medicine

## 2013-08-26 DIAGNOSIS — Z1231 Encounter for screening mammogram for malignant neoplasm of breast: Secondary | ICD-10-CM

## 2013-08-27 NOTE — Telephone Encounter (Signed)
Med filled.  

## 2013-08-29 ENCOUNTER — Ambulatory Visit (HOSPITAL_COMMUNITY): Payer: 59

## 2013-08-29 ENCOUNTER — Ambulatory Visit (HOSPITAL_COMMUNITY)
Admission: RE | Admit: 2013-08-29 | Discharge: 2013-08-29 | Disposition: A | Discharge status: Home / Self Care | Payer: 59 | Source: Ambulatory Visit | Attending: Obstetrics and Gynecology | Admitting: Obstetrics and Gynecology

## 2013-08-29 DIAGNOSIS — Z1231 Encounter for screening mammogram for malignant neoplasm of breast: Secondary | ICD-10-CM | POA: Diagnosis present

## 2013-09-23 ENCOUNTER — Other Ambulatory Visit: Payer: Self-pay | Admitting: Family Medicine

## 2013-09-23 NOTE — Telephone Encounter (Signed)
Med filled.  

## 2013-09-30 ENCOUNTER — Other Ambulatory Visit: Payer: Self-pay | Admitting: Family Medicine

## 2013-09-30 NOTE — Telephone Encounter (Signed)
Med filled.  

## 2013-11-05 ENCOUNTER — Other Ambulatory Visit: Payer: Self-pay

## 2013-11-05 MED ORDER — TIOTROPIUM BROMIDE MONOHYDRATE 18 MCG IN CAPS: 18.0000 ug | ORAL_CAPSULE | Freq: Every day | RESPIRATORY_TRACT | Status: DC

## 2013-11-07 ENCOUNTER — Other Ambulatory Visit: Payer: Self-pay | Admitting: Internal Medicine

## 2013-11-07 DIAGNOSIS — R05 Cough: Secondary | ICD-10-CM

## 2013-11-07 DIAGNOSIS — R059 Cough, unspecified: Secondary | ICD-10-CM

## 2013-11-07 DIAGNOSIS — J441 Chronic obstructive pulmonary disease with (acute) exacerbation: Secondary | ICD-10-CM

## 2013-11-07 MED ORDER — TIOTROPIUM BROMIDE MONOHYDRATE 18 MCG IN CAPS: 18.0000 ug | ORAL_CAPSULE | Freq: Every day | RESPIRATORY_TRACT | Status: DC

## 2013-11-18 ENCOUNTER — Other Ambulatory Visit: Payer: Self-pay | Admitting: Family Medicine

## 2013-11-18 NOTE — Telephone Encounter (Signed)
Med filled.  

## 2013-12-16 ENCOUNTER — Other Ambulatory Visit: Payer: Self-pay | Admitting: Family Medicine

## 2013-12-16 NOTE — Telephone Encounter (Signed)
Med filled.  

## 2014-01-08 ENCOUNTER — Other Ambulatory Visit: Payer: Self-pay | Admitting: General Practice

## 2014-01-08 MED ORDER — FEXOFENADINE HCL 180 MG PO TABS
180.0000 mg | ORAL_TABLET | Freq: Every day | ORAL | Status: DC
Start: 2014-01-08 — End: 2014-10-28

## 2014-02-14 ENCOUNTER — Ambulatory Visit (INDEPENDENT_AMBULATORY_CARE_PROVIDER_SITE_OTHER): Payer: 59 | Admitting: Family Medicine

## 2014-02-14 ENCOUNTER — Other Ambulatory Visit: Payer: Self-pay | Admitting: General Practice

## 2014-02-14 ENCOUNTER — Encounter: Payer: Self-pay | Admitting: Family Medicine

## 2014-02-14 VITALS — BP 122/80 | HR 83 | Temp 98.2°F | Resp 16 | Ht 62.0 in | Wt 132.2 lb

## 2014-02-14 DIAGNOSIS — R0789 Other chest pain: Secondary | ICD-10-CM

## 2014-02-14 DIAGNOSIS — F329 Major depressive disorder, single episode, unspecified: Secondary | ICD-10-CM

## 2014-02-14 DIAGNOSIS — Z0000 Encounter for general adult medical examination without abnormal findings: Secondary | ICD-10-CM

## 2014-02-14 DIAGNOSIS — F32A Depression, unspecified: Secondary | ICD-10-CM

## 2014-02-14 LAB — BASIC METABOLIC PANEL
BUN: 16 mg/dL (ref 6–23)
CO2: 33 mEq/L — ABNORMAL HIGH (ref 19–32)
Calcium: 9.3 mg/dL (ref 8.4–10.5)
Chloride: 102 mEq/L (ref 96–112)
Creatinine, Ser: 0.64 mg/dL (ref 0.40–1.20)
GFR: 101.61 mL/min (ref >60.00)
GLUCOSE: 91 mg/dL (ref 70–99)
Potassium: 3.6 mEq/L (ref 3.5–5.1)
Sodium: 139 mEq/L (ref 135–145)

## 2014-02-14 LAB — CBC WITH DIFFERENTIAL/PLATELET
Basophils Absolute: 0.1 10*3/uL (ref 0.0–0.1)
Basophils Relative: 0.9 % (ref 0.0–3.0)
Eosinophils Absolute: 0.1 10*3/uL (ref 0.0–0.7)
Eosinophils Relative: 2.6 % (ref 0.0–5.0)
HEMATOCRIT: 42.9 % (ref 36.0–46.0)
Hemoglobin: 14.9 g/dL (ref 12.0–15.0)
Lymphocytes Relative: 29.7 % (ref 12.0–46.0)
Lymphs Abs: 1.7 10*3/uL (ref 0.7–4.0)
MCHC: 34.6 g/dL (ref 30.0–36.0)
MCV: 95.6 fl (ref 78.0–100.0)
MONO ABS: 0.5 10*3/uL (ref 0.1–1.0)
Monocytes Relative: 7.8 % (ref 3.0–12.0)
NEUTROS ABS: 3.4 10*3/uL (ref 1.4–7.7)
Neutrophils Relative %: 59 % (ref 43.0–77.0)
Platelets: 304 10*3/uL (ref 150.0–400.0)
RBC: 4.49 Mil/uL (ref 3.87–5.11)
RDW: 12.9 % (ref 11.5–15.5)
WBC: 5.8 10*3/uL (ref 4.0–10.5)

## 2014-02-14 LAB — LIPID PANEL
CHOLESTEROL: 240 mg/dL — AB (ref 0–200)
HDL: 53.9 mg/dL (ref >39.00)
LDL Cholesterol: 164 mg/dL — ABNORMAL HIGH (ref 0–99)
NONHDL: 186.1
TRIGLYCERIDES: 111 mg/dL (ref 0.0–149.0)
Total CHOL/HDL Ratio: 4
VLDL: 22.2 mg/dL (ref 0.0–40.0)

## 2014-02-14 LAB — HEPATIC FUNCTION PANEL
ALBUMIN: 4 g/dL (ref 3.5–5.2)
ALT: 13 U/L (ref 0–35)
AST: 14 U/L (ref 0–37)
Alkaline Phosphatase: 82 U/L (ref 39–117)
Bilirubin, Direct: 0.1 mg/dL (ref 0.0–0.3)
TOTAL PROTEIN: 6.3 g/dL (ref 6.0–8.3)
Total Bilirubin: 0.5 mg/dL (ref 0.2–1.2)

## 2014-02-14 LAB — TSH: TSH: 0.96 u[IU]/mL (ref 0.35–4.50)

## 2014-02-14 LAB — VITAMIN D 25 HYDROXY (VIT D DEFICIENCY, FRACTURES): VITD: 12.91 ng/mL — AB (ref 30.00–100.00)

## 2014-02-14 MED ORDER — TRIAMCINOLONE ACETONIDE 0.1 % EX OINT: 1.0000 "application " | TOPICAL_OINTMENT | Freq: Two times a day (BID) | CUTANEOUS | Status: DC

## 2014-02-14 MED ORDER — ATORVASTATIN CALCIUM 20 MG PO TABS: 20.0000 mg | ORAL_TABLET | Freq: Every day | ORAL | Status: DC

## 2014-02-14 MED ORDER — VITAMIN D (ERGOCALCIFEROL) 1.25 MG (50000 UNIT) PO CAPS: 50000.0000 [IU] | ORAL_CAPSULE | ORAL | Status: DC

## 2014-02-14 MED ORDER — BUPROPION HCL ER (XL) 300 MG PO TB24: 300.0000 mg | ORAL_TABLET | Freq: Every day | ORAL | Status: DC

## 2014-02-14 NOTE — Patient Instructions (Signed)
Follow up in 6 weeks to recheck mood We'll notify you of your lab results and make any changes if needed Increase the Wellbutrin to 300mg  daily- take 2 of what you have, 1 of the new prescription when you pick it up [facility] call you with your cardiology appt to assess your chest pain Please call GI and schedule your colonoscopy at your convenience (or call us for a new referral to a different office) Use the triamcinolone very sparingly twice daily to the rash under your eyes Call with any questions or concerns Hang in there!

## 2014-02-14 NOTE — Progress Notes (Signed)
Pre visit review using our clinic review tool, if applicable. No additional management support is needed unless otherwise documented below in the visit note. 

## 2014-02-14 NOTE — Progress Notes (Signed)
   Subjective:    Patient ID: Carol Quinn, female    DOB: 1955/10/10, 59 y.o.   MRN: [id number]  HPI CPE- no need for pap due to hysterectomy.  Overdue on colonoscopy- has received a call back from [facility] (Dr Susa Griffins).  Due for DEXA.  UTD on mammo.  Has GYN- [facility]   Review of Systems Patient reports no vision/ hearing changes, adenopathy,fever, weight change,  persistant/recurrent hoarseness , swallowing issues, palpitations, edema, persistant/recurrent cough, hemoptysis, dyspnea (rest/exertional/paroxysmal nocturnal), gastrointestinal bleeding (melena, rectal bleeding), abdominal pain, significant heartburn, bowel changes, GU symptoms (dysuria, hematuria, incontinence), Gyn symptoms (abnormal  bleeding, pain),  syncope, focal weakness, memory loss, numbness & tingling, skin/hair/nail changes, abnormal bruising or bleeding.  + L chest pain intermittently, described as 'discomfort'- risk factors for CAD include HTN, tobacco use  + depression- 'i'm old, i have COPD, my job sucks, and i don't have any fun anymore, so yeah, I'm depressed'  'i could go to bed and sleep for days and days.'  Reviewed meds, allergies, problem list, and PMH in chart     Objective:   Physical Exam General Appearance:    Alert, cooperative, no distress, appears stated age  Head:    Normocephalic, without obvious abnormality, atraumatic  Eyes:    PERRL, conjunctiva/corneas clear, EOM's intact, fundi    benign, both eyes  Ears:    Normal TM's and external ear canals, both ears  Nose:   Nares normal, septum midline, mucosa normal, no drainage    or sinus tenderness  Throat:   Lips, mucosa, and tongue normal; teeth and gums normal  Neck:   Supple, symmetrical, trachea midline, no adenopathy;    Thyroid: no enlargement/tenderness/nodules  Back:     Symmetric, no curvature, ROM normal, no CVA tenderness  Lungs:     Clear to auscultation bilaterally, respirations unlabored  Chest Wall:    No tenderness or deformity   Heart:    Regular rate and rhythm, S1 and S2 normal, no murmur, rub   or gallop  Breast Exam:    Deferred to GYN  Abdomen:     Soft, non-tender, bowel sounds active all four quadrants,    no masses, no organomegaly  Genitalia:    Deferred to GYN  Rectal:    Extremities:   Extremities normal, atraumatic, no cyanosis or edema  Pulses:   2+ and symmetric all extremities  Skin:   Skin color, texture, turgor normal, no rashes or lesions  Lymph nodes:   Cervical, supraclavicular, and axillary nodes normal  Neurologic:   CNII-XII intact, normal strength, sensation and reflexes    throughout          Assessment & Plan:

## 2014-02-16 NOTE — Assessment & Plan Note (Signed)
Pt's PE WNL.  UTD on GYN.  Due for colonoscopy.  Pt is to either call and schedule w/ previous GI or let me know name of who she wants referred to.  Check labs.  Anticipatory guidance provided.

## 2014-02-16 NOTE — Assessment & Plan Note (Signed)
Pt has hx of similar.  Asymptomatic today.  Suspect this is stress related and not cardiac but pt does have risk factors- HTN, tobacco use.  EKG w/o acute changes.  Refer to cardiology for complete evaluation.  Pt expressed understanding and is in agreement w/ plan.

## 2014-02-16 NOTE — Assessment & Plan Note (Signed)
Deteriorated.  Increase Wellbutrin to 300mg .  Continue Lexapro.  Will follow closely.

## 2014-02-17 ENCOUNTER — Telehealth: Payer: Self-pay | Admitting: Family Medicine

## 2014-02-17 NOTE — Telephone Encounter (Signed)
emmi emailed °

## 2014-02-18 ENCOUNTER — Telehealth: Payer: Self-pay | Admitting: Internal Medicine

## 2014-02-18 DIAGNOSIS — R059 Cough, unspecified: Secondary | ICD-10-CM

## 2014-02-18 DIAGNOSIS — R05 Cough: Secondary | ICD-10-CM

## 2014-02-18 DIAGNOSIS — J441 Chronic obstructive pulmonary disease with (acute) exacerbation: Secondary | ICD-10-CM

## 2014-02-18 MED ORDER — TIOTROPIUM BROMIDE MONOHYDRATE 18 MCG IN CAPS: 18.0000 ug | ORAL_CAPSULE | Freq: Every day | RESPIRATORY_TRACT | Status: DC

## 2014-02-18 MED ORDER — ARMODAFINIL 250 MG PO TABS: 250.0000 mg | ORAL_TABLET | Freq: Every day | ORAL | Status: DC

## 2014-02-18 NOTE — Telephone Encounter (Signed)
Per CY-okay to refill Nuvigil a requested. Thanks.

## 2014-02-18 NOTE — Telephone Encounter (Signed)
Called and spoke to pt. Informed pt of the refill. Rx called into preferred pharmacy. Pt verbalized understanding and denied any further questions or concerns at this time.

## 2014-02-18 NOTE — Telephone Encounter (Signed)
Pt calling for refills of Spiriva and Nuvigil.  Pt last seen 11/2012 Appt made for 04/10/14 at 3:15 with CY to re-establish.  Spiriva refill sent, pt states that she is completely out of this, need refilled asap.  Please advise Dr Annamaria Boots is you are okay with refilling Nuvigil. Thanks.

## 2014-03-03 ENCOUNTER — Encounter: Payer: Self-pay | Admitting: Family Medicine

## 2014-03-21 ENCOUNTER — Telehealth: Payer: Self-pay | Admitting: Internal Medicine

## 2014-03-21 DIAGNOSIS — J441 Chronic obstructive pulmonary disease with (acute) exacerbation: Secondary | ICD-10-CM

## 2014-03-21 DIAGNOSIS — R05 Cough: Secondary | ICD-10-CM

## 2014-03-21 DIAGNOSIS — R059 Cough, unspecified: Secondary | ICD-10-CM

## 2014-03-21 MED ORDER — TIOTROPIUM BROMIDE MONOHYDRATE 18 MCG IN CAPS: 18.0000 ug | ORAL_CAPSULE | Freq: Every day | RESPIRATORY_TRACT | Status: DC

## 2014-03-21 MED ORDER — ARMODAFINIL 250 MG PO TABS: 250.0000 mg | ORAL_TABLET | Freq: Every day | ORAL | Status: DC

## 2014-03-21 NOTE — Telephone Encounter (Signed)
Called spoke w/ pt. 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 She needs a thurs/fri afternoon appt. She is now scheduled for 05/29/14 w/ CDY. Pt has not been seen since 11/15/12. Pt wants enough refills sent in on her spiriva and nuvigil to get her to her may appt. Please advise CDY if okay to do so? thanks

## 2014-03-21 NOTE — Telephone Encounter (Signed)
Ok to refill as requested 

## 2014-03-21 NOTE — Telephone Encounter (Signed)
Refills called in for Nuvigil and Spiriva.  Patient notified. Nothing further needed.

## 2014-03-31 ENCOUNTER — Encounter: Payer: Self-pay | Admitting: Internal Medicine

## 2014-03-31 ENCOUNTER — Ambulatory Visit (INDEPENDENT_AMBULATORY_CARE_PROVIDER_SITE_OTHER): Payer: 59 | Admitting: Internal Medicine

## 2014-03-31 VITALS — BP 145/74 | HR 69 | Ht 62.0 in | Wt 133.0 lb

## 2014-03-31 DIAGNOSIS — R0602 Shortness of breath: Secondary | ICD-10-CM

## 2014-03-31 NOTE — Patient Instructions (Signed)
Your physician recommends that you continue on your current medications as directed. Please refer to the Current Medication list given to you today. Your physician has requested that you have a lexiscan myoview. For further information please visit [email or web address]. Please follow instruction sheet, as given.  

## 2014-03-31 NOTE — Progress Notes (Signed)
Cardiology Office Note   Date:  03/31/2014   ID:  Carol Quinn, DOB 1955/06/14, MRN [id number]  PCP:  Carol Asa, MD  Cardiologist:   Carol Carnes, MD   Chief Complaint  Patient presents with  . new patient    ekg performed 02/2014    Patient is a 59 yo who is referred for evaluation of CP   History of Present Illness: Carol Quinn is a 59 y.o. female who was referred by Carol Quinn  Patient reports chest discomfort  She feels a pain under breast under nipple  Occasionally more medial  This site is where previous lumpectomy Pain under nipple is sharp  NO pleuritic  Has tried anything   Lasts less than a min  Not associated with activity   Chest will feel tight as well  Can happen from time to time  Has COPD  Has attrib to that   Tightness seems to happen when congested.  Happens when stressed/anxious     Current Outpatient Prescriptions  Medication Sig Dispense Refill  . atorvastatin (LIPITOR) 20 MG tablet Take 1 tablet (20 mg total) by mouth daily. 30 tablet 6  . buPROPion (WELLBUTRIN XL) 300 MG 24 hr tablet Take 1 tablet (300 mg total) by mouth daily. 30 tablet 3  . escitalopram (LEXAPRO) 20 MG tablet TAKE 1 TABLET BY MOUTH DAILY 30 tablet 2  . estradiol (VIVELLE-DOT) 0.1 MG/24HR patch APPLY 1 PATCH TO SKIN TWICE WEEKLY AS DIRECTED    . fexofenadine (ALLEGRA) 180 MG tablet Take 1 tablet (180 mg total) by mouth daily. 30 tablet 5  . losartan-hydrochlorothiazide (HYZAAR) 100-25 MG per tablet TAKE 1 TABLET BY MOUTH DAILY 30 tablet 3  . mometasone-formoterol (DULERA) 100-5 MCG/ACT AERO Inhale 2 puffs into the lungs 2 (two) times daily. 1 Inhaler prn  . montelukast (SINGULAIR) 10 MG tablet TAKE 1 TABLET BY MOUTH AT BEDTIME 30 tablet 6  . omeprazole (PRILOSEC) 40 MG capsule TAKE 1 CAPSULE BY MOUTH ONCE DAILY 30 capsule 6  . potassium chloride SA (K-DUR,KLOR-CON) 20 MEQ tablet TAKE 1 TABLET BY MOUTH DAILY. 30 tablet 3  . tiotropium (SPIRIVA) 18 MCG inhalation capsule Place 1  capsule (18 mcg total) into inhaler and inhale daily. 30 capsule 2  . albuterol (PROVENTIL HFA;VENTOLIN HFA) 108 (90 BASE) MCG/ACT inhaler Inhale 2 puffs into the lungs every 4 (four) hours as needed for wheezing. (Patient not taking: Reported on 03/31/2014) 1 Inhaler prn  . Armodafinil (NUVIGIL) 250 MG tablet Take 1 tablet (250 mg total) by mouth daily. (Patient not taking: Reported on 03/31/2014) 30 tablet 2  . ipratropium-albuterol (DUONEB) 0.5-2.5 (3) MG/3ML SOLN Take 3 mLs by nebulization every 6 (six) hours as needed. (Patient not taking: Reported on 03/31/2014) 360 mL 5  . SUMAtriptan (IMITREX) 100 MG tablet Take as needed for migraines.     . triamcinolone ointment (KENALOG) 0.1 % Apply 1 application topically 2 (two) times daily. (Patient not taking: Reported on 03/31/2014) 90 g 1  . Vitamin D, Ergocalciferol, (DRISDOL) 50000 UNITS CAPS capsule Take 1 capsule (50,000 Units total) by mouth every 7 (seven) days. (Patient not taking: Reported on 03/31/2014) 12 capsule 0  . zolmitriptan (ZOMIG) 5 MG nasal solution Take as directed as needed for migraines      No current facility-administered medications for this visit.    Allergies:   Clarithromycin; Codeine; Hydrocodone-homatropine; and Penicillins   Past Medical History  Diagnosis Date  . Childhood asthma   . GERD (gastroesophageal  reflux disease)   . Allergic rhinitis   . Hypertension   . RBBB (right bundle branch block with left anterior fascicular block)   . Exposure to TB     uncle-her PPD neg  . Chronic headaches   . Acute bronchitis   . Complication of anesthesia     woke up during surgery x 1  . Complication of anesthesia     hard to wake up x 1  . COPD (chronic obstructive pulmonary disease)     emphysema    Past Surgical History  Procedure Laterality Date  . Total abdominal hysterectomy    . Tubal ligation    . Breast biopsy  2002  . Nose surgery      "nasal deviation"     Social History:  The patient  reports  that she has been smoking Cigarettes.  She has a 32 pack-year smoking history. She has never used smokeless tobacco. She reports that she does not drink alcohol.   Family History:  The patient's family history includes Arthritis in her maternal grandmother; Asthma in her maternal uncle and sister; Cancer in her maternal grandmother, sister, and other family members; Emphysema in her mother; Heart attack in her mother; Heart failure in her father.    ROS:  Please see the history of present illness. All other systems are reviewed and  Negative to the above problem except as noted.    PHYSICAL EXAM: VS:  BP 145/74 mmHg  Pulse 69  Ht 5\' 2"  (1.575 m)  Wt 133 lb (60.328 kg)  BMI 24.32 kg/m2  GEN: Well nourished, well developed, in no acute distress HEENT: normal Neck: no JVD, carotid bruits, or masses Cardiac: RRR; no murmurs, rubs, or gallops,no edema  Respiratory:  Decreased airflow  No wheezes  GI: soft, nontender, nondistended, + BS  No hepatomegaly  MS: no deformity Moving all extremities   Skin: warm and dry, no rash Neuro:  Strength and sensation are intact Psych: euthymic mood, full affect   EKG:  EKG is not ordered today.  02/24/14   SR 64 bpm  RBBB   Lipid Panel    Component Value Date/Time   CHOL 240* 02/14/2014 1012   TRIG 111.0 02/14/2014 1012   HDL 53.90 02/14/2014 1012   CHOLHDL 4 02/14/2014 1012   VLDL 22.2 02/14/2014 1012   LDLCALC 164* 02/14/2014 1012   LDLDIRECT 126.9 12/21/2012 1148      Wt Readings from Last 3 Encounters:  03/31/14 133 lb (60.328 kg)  02/14/14 132 lb 4 oz (59.988 kg)  08/16/13 136 lb (61.689 kg)      ASSESSMENT AND PLAN: 1.  CP  The patient has several types  The only one that is concerning for possible angina is the chest tightness that she feels.  The others are prob musculoskeletal  Chest tightnss may be from COPD   I would recomm lexiscan myoview to r/o inducible ischemia  2.  Pulm  Patient appears to have signif COPD on exam   Counselled on smoking cessation      Current medicines are reviewed at length with the patient today.  The patient does not have concerns regarding medicines.  The following changes have been made: None  Labs/ tests ordered today include:  Lexiscan myoview No orders of the defined types were placed in this encounter.     Disposition:   FU will depend on test resultss  Signed, Carol Carnes, MD  03/31/2014 3:48 PM    [facility] [place], [place], [place]  [address] Phone: [phone]; Fax: [phone]

## 2014-04-07 ENCOUNTER — Other Ambulatory Visit: Payer: Self-pay | Admitting: Family Medicine

## 2014-04-08 NOTE — Telephone Encounter (Signed)
Med filled.  

## 2014-04-10 ENCOUNTER — Ambulatory Visit: Payer: 59 | Admitting: Internal Medicine

## 2014-04-11 ENCOUNTER — Ambulatory Visit (HOSPITAL_COMMUNITY): Payer: 59 | Attending: Internal Medicine | Admitting: Radiology

## 2014-04-11 DIAGNOSIS — R0602 Shortness of breath: Secondary | ICD-10-CM

## 2014-04-11 MED ORDER — TECHNETIUM TC 99M SESTAMIBI GENERIC - CARDIOLITE
30.0000 | Freq: Once | INTRAVENOUS | Status: AC | PRN
  Administered 2014-04-11: 30 via INTRAVENOUS

## 2014-04-11 MED ORDER — REGADENOSON 0.4 MG/5ML IV SOLN
0.4000 mg | Freq: Once | INTRAVENOUS | Status: AC
  Administered 2014-04-11: 0.4 mg via INTRAVENOUS

## 2014-04-11 MED ORDER — AMINOPHYLLINE 25 MG/ML IV SOLN
75.0000 mg | Freq: Once | INTRAVENOUS | Status: AC
  Administered 2014-04-11: 75 mg via INTRAVENOUS

## 2014-04-11 MED ORDER — TECHNETIUM TC 99M SESTAMIBI GENERIC - CARDIOLITE
11.0000 | Freq: Once | INTRAVENOUS | Status: AC | PRN
  Administered 2014-04-11: 11 via INTRAVENOUS

## 2014-04-11 NOTE — Progress Notes (Signed)
[place] [address] [place], [place] [address] [phone]    Cardiology Nuclear Med Study  Carol Quinn is a 59 y.o. female     MRN : [id number]     DOB: January 19, 1955  Procedure Date: 04/11/2014  Nuclear Med Background Indication for Stress Test:  Evaluation for Ischemia History:  No known CAD, Asthma Cardiac Risk Factors: Hypertension and RBBB  Symptoms:  Chest Pain   Nuclear Pre-Procedure Caffeine/Decaff Intake:  None> 12 hrs NPO After: 8:00pm   Lungs:  clear O2 Sat: 95% on room air. IV 0.9% NS with Angio Cath:  22g  IV Site: R Hand x 1, tolerated well IV Started by:  Irven Baltimore, RN  Chest Size (in):  36 Cup Size: C  Height: 5\' 2"  (1.575 m)  Weight:  130 lb (58.968 kg)  BMI:  Body mass index is 23.77 kg/(m^2). Tech Comments:  Patient took Hyzaar this am. Irven Baltimore, RN.    Nuclear Med Study 1 or 2 day study: 1 day  Stress Test Type:  Lexiscan  Reading MD: N/A  Order Authorizing Provider:  Dorris Carnes, MD  Resting Radionuclide: Technetium 33m Sestamibi  Resting Radionuclide Dose: 11.0 mCi   Stress Radionuclide:  Technetium 39m Sestamibi  Stress Radionuclide Dose: 33.0 mCi           Stress Protocol Rest HR: 70 Stress HR: 96  Rest BP: 102/66 Stress BP: 96/57  Exercise Time (min): n/a METS: n/a   Predicted Max HR: 163 bpm % Max HR: 58.9 bpm Rate Pressure Product: 9600   Dose of Adenosine (mg):  n/a Dose of Lexiscan: 0.4 mg  Dose of Atropine (mg): n/a Dose of Dobutamine: n/a mcg/kg/min (at max HR)  Stress Test Technologist: Glade Lloyd, BS-ES  Nuclear Technologist:  Earl Many, CNMT     Rest Procedure:  Myocardial perfusion imaging was performed at rest 45 minutes following the intravenous administration of Technetium 83m Sestamibi. Rest ECG: NSR-RBBB  Stress Procedure:  The patient received IV Lexiscan 0.4 mg over 15-seconds.  Technetium 50m Sestamibi injected at 30-seconds.  Quantitative spect images were obtained  after a 45 minute delay.  During the infusion the patient complained of SOB, nausea and a headache.  The nausea did not subside so 75 mg aminophilline was given and the patient began to feel much better.  Stress ECG: No significant change from baseline ECG  QPS Raw Data Images:  Normal; no motion artifact; normal heart/lung ratio. Stress Images:  Normal homogeneous uptake in all areas of the myocardium. Rest Images:  Normal homogeneous uptake in all areas of the myocardium. Subtraction (SDS):  No evidence of ischemia. Transient Ischemic Dilatation (Normal <1.22):  1.04 Lung/Heart Ratio (Normal <0.45):  0.20  Quantitative Gated Spect Images QGS EDV:  57 ml QGS ESV:  14 ml  Impression Exercise Capacity:  Lexiscan with no exercise. BP Response:  Normal blood pressure response. Clinical Symptoms:  No significant symptoms noted. ECG Impression:  No significant ST segment change suggestive of ischemia. Comparison with Prior Nuclear Study: No images to compare  Overall Impression:  Normal stress nuclear study.  No ischemia.  Normal LV function   LV Ejection Fraction: 75%.  LV Wall Motion:  NL LV Function; NL Wall Motion   Thayer Headings, Brooke Bonito., MD, [facility] 04/11/2014, 4:33 PM [address],  [place] Pager [phone]

## 2014-05-09 ENCOUNTER — Other Ambulatory Visit: Payer: Self-pay | Admitting: Family Medicine

## 2014-05-12 NOTE — Telephone Encounter (Signed)
Meds filled

## 2014-05-29 ENCOUNTER — Ambulatory Visit (INDEPENDENT_AMBULATORY_CARE_PROVIDER_SITE_OTHER): Payer: 59 | Admitting: Internal Medicine

## 2014-05-29 ENCOUNTER — Ambulatory Visit (INDEPENDENT_AMBULATORY_CARE_PROVIDER_SITE_OTHER)
Admission: RE | Admit: 2014-05-29 | Discharge: 2014-05-29 | Disposition: A | Discharge status: Home / Self Care | Payer: 59 | Source: Ambulatory Visit | Attending: Internal Medicine | Admitting: Internal Medicine

## 2014-05-29 VITALS — BP 102/70 | HR 79

## 2014-05-29 DIAGNOSIS — J441 Chronic obstructive pulmonary disease with (acute) exacerbation: Secondary | ICD-10-CM

## 2014-05-29 DIAGNOSIS — R059 Cough, unspecified: Secondary | ICD-10-CM

## 2014-05-29 DIAGNOSIS — R05 Cough: Secondary | ICD-10-CM

## 2014-05-29 DIAGNOSIS — Z720 Tobacco use: Secondary | ICD-10-CM | POA: Diagnosis not present

## 2014-05-29 DIAGNOSIS — J432 Centrilobular emphysema: Secondary | ICD-10-CM

## 2014-05-29 DIAGNOSIS — G4710 Hypersomnia, unspecified: Secondary | ICD-10-CM

## 2014-05-29 MED ORDER — UMECLIDINIUM-VILANTEROL 62.5-25 MCG/INH IN AEPB: 1.0000 | INHALATION_SPRAY | Freq: Every day | RESPIRATORY_TRACT | Status: DC

## 2014-05-29 MED ORDER — MOMETASONE FURO-FORMOTEROL FUM 100-5 MCG/ACT IN AERO: 2.0000 | INHALATION_SPRAY | Freq: Two times a day (BID) | RESPIRATORY_TRACT | Status: DC

## 2014-05-29 MED ORDER — ARMODAFINIL 250 MG PO TABS: 250.0000 mg | ORAL_TABLET | Freq: Every day | ORAL | Status: DC

## 2014-05-29 MED ORDER — ALBUTEROL SULFATE HFA 108 (90 BASE) MCG/ACT IN AERS: 2.0000 | INHALATION_SPRAY | RESPIRATORY_TRACT | Status: DC | PRN

## 2014-05-29 MED ORDER — TIOTROPIUM BROMIDE MONOHYDRATE 18 MCG IN CAPS: 18.0000 ug | ORAL_CAPSULE | Freq: Every day | RESPIRATORY_TRACT | Status: DC

## 2014-05-29 MED ORDER — IPRATROPIUM-ALBUTEROL 0.5-2.5 (3) MG/3ML IN SOLN: 3.0000 mL | Freq: Four times a day (QID) | RESPIRATORY_TRACT | Status: DC | PRN

## 2014-05-29 MED ORDER — MONTELUKAST SODIUM 10 MG PO TABS: 10.0000 mg | ORAL_TABLET | Freq: Every day | ORAL | Status: DC

## 2014-05-29 NOTE — Progress Notes (Signed)
Patient ID: Carol Quinn, female    DOB: 02/27/1955, 59 y.o.   MRN: [id number]  HPI 69 yoF former smoker followed for COPD and hypersomnia. Last here Sept 19, when she had been sick after a cruise. She did gradually get better. Comes now- to f/u after MSLT done for assessment of her persistent c/o "tired". Today she also says she has been having some sore throat this week w/o fever. Some dry cough. Denies fever or nodes. Lab- MSLT 04/28/10- short at 4.1 minutes, SOREM 0, so nonspecific, c/w primary ideopathic hypersomnia.  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 Still has a sense of fatigue, feeling she can't put one foot in front of another,  and easy confusion. Caffeine has no effect. Thyroid has been checked a lot. Sister has lupus, but she has tested negative.  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 We have again discussed motivation and support for quitting . Acute visit- Called 09/13/10 - On call Dr. gave prednisone taper and Zpak for 4 day of hx cough, green sputum and SOB. 1 day fever. No better so far with this Rx. Using her inhaler more.   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 Continues to smoke against advice. Hydromet cough syrup made her "paranoid"-she cried and screamed. 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 Nuvigil has allowed her to get up and get to work on a regular basis while sleeping soundly at night. If she skips a dose she expects to sleep all day. Her breathing has been stable with Symbicort. She rarely needs a rescue inhaler. She has had no acute respiratory illness  recently.  04/06/2011 Acute OV  Complains of prod cough with brown mucus, increased SOB, wheezing, tightness in chest x1.5week, progessively getting worse.  Has been using over-the-counter cough products, without any relief. Plays increased wheezing, and shortness of breath over last few days. We talked adeptly about smoking cessation. She denies any hemoptysis, chest pain, orthopnea, PND, or leg swelling. >>tx w/ Omnicef/Prednisone taper   04/15/2011 [facility]  Patient returns for a post hospitalization visit. Patient was admitted March 29- 04/10/2011 for a COPD exacerbation. Patient presented with progressively worsening cough, congestion, and wheezing. She was treated with IV antibiotics, steroids, and aggressive nebulizer treatments. X-ray showed no acute changes. Once again, she was recommended to quit smoking. She was discharged on Avelox and a prednisone taper. Since discharge. Patient says she is still feeling weak with no energy.  No smoking x 1 week. Has 2 days left on Avelox and Prednisone .  Discussed smoking cesstation.   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 Avelox helped. Back at work, off of prednisone and antibiotics with no cough. Smoking an electronic cigarette now. We discussed smoking cessation again. 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 Chest tight, racing heart no increased use of bronchodilator. She continues Nuvigil "very helpful" for help in her stay awake at work. Trying to get adequate sleep at night. COPD assessment test  (CAT) score 32/40.  01/16/11 Acute OV  Complains of prod cough with brown/green mucus, hoarseness, increased SOB, wheezing, tightness in chest, back pain, chills x2weeks.  finished levaquin yesterday.  Called in levaquin 500mg  x 7 days on 12/30 for above symptoms Some better but not gone completely , still has thick mucus , green at time.  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 Pt c/o chest congestion (not bad right now), she has good and bad days with her breathing, slight cough w/ cream color phlegm, chest tx at times. Increased cough over the past month and chest tightness and postnasal drip. Rescue inhaler and Symbicort have been sufficient. Nebulizer helps in the winter time. Still smoking against advice. Nuvigil still works well for her.  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 Denies fever, chills, sore throat or GI upset. Last got doxycycline and prednisone in June. Is not trying to stop smoking. We discussed this again. CXR 04/07/12 IMPRESSION:  Emphysema without acute cardiopulmonary disease.  Original Report Authenticated By: Carol Quinn, M.D.  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 PFT 06/27/08- FEV1/FVC 0.51, DLCO 49% severe COPD a1AT 04/19/10- MM nl CXR 05/17/13- mild hyperV, NAD Mostly dyspnea on exertion but notices some dry cough in the last 3 weeks. Always feels tired. She is aware of diagnosis of emphysema and admits stress. Myocardial perfusion imaging study was normal.  she uses her nebulizer machine mostly with if she has a cold. Nuvigil has been a big help with daytime fatigue.  ROS-see HPI Constitutional:   No-   weight loss, night sweats, fevers, chills, +fatigue, lassitude. HEENT:   No-  headaches, difficulty swallowing, tooth/dental problems, sore throat,       No-  sneezing, itching, ear ache, nasal congestion, +post nasal drip,  CV:  No-   chest pain, orthopnea, PND, swelling in lower extremities, anasarca, dizziness, palpitations Resp: + shortness of breath with exertion or at rest.              + productive cough,  No non-productive cough,  No- coughing up of blood.              + change in color of mucus.  No- wheezing.   Skin: No-   rash or lesions. GI:  No-   heartburn, indigestion, abdominal pain, nausea, vomiting,  GU:  MS:  No-   joint pain or swelling.   Neuro-     nothing unusual Psych:  No- change in mood or affect. No depression or anxiety.  No memory loss.  OBJ- Physical Exam General- Alert, Oriented, Affect-appropriate, Distress- none acute. Skin- rash-none, lesions- none, excoriation- none Lymphadenopathy- none Head- atraumatic            Eyes- Gross vision intact, PERRLA, conjunctivae and secretions clear  Ears- Hearing, canals-normal            Nose- +turbinate edema, no-Septal dev, mucus, polyps, erosion, perforation             Throat- Mallampati II , mucosa clear/ not red , drainage- none, tonsils- atrophic Neck- flexible , trachea midline, no stridor , thyroid nl, carotid no bruit Chest - symmetrical excursion , unlabored           Heart/CV- RRR , no murmur , no gallop  , no rub, nl s1 s2                           - JVD- none , edema- none, stasis changes- none, varices- none           Lung-  wheeze- none, cough-none , dullness-none, rub- none           Chest wall-  Abd-  Br/ Gen/ Rectal- Not done, not indicated Extrem- cyanosis- none, clubbing, none, atrophy- none, strength- nl Neuro- grossly intact to  observation

## 2014-05-29 NOTE — Patient Instructions (Signed)
Order- schedule PFT and 6 MWT  Dx COPD with centrilobular emphysema  Order CXR-   Scripts sent for med refills. Printed scripts for [employer] and Spiriva to hold while you try Anoro  Sample Anoro Ellipta inhaler   1 puff, once daily   Try this instead of Dulera and Spiriva to see what you think  ( Anoro and Stiolto are in the same category)

## 2014-05-30 NOTE — Progress Notes (Signed)
Quick Note:  LMTCB ______ 

## 2014-06-02 NOTE — Progress Notes (Signed)
Quick Note:  Called and spoke to pt. Reviewed cxr results and recs. Pt voiced understanding with no further questions. ______

## 2014-06-10 ENCOUNTER — Other Ambulatory Visit: Payer: Self-pay | Admitting: Family Medicine

## 2014-06-11 NOTE — Telephone Encounter (Signed)
Med filled.  

## 2014-06-15 ENCOUNTER — Encounter: Payer: Self-pay | Admitting: Internal Medicine

## 2014-06-15 NOTE — Assessment & Plan Note (Signed)
Increased cough and exertional dyspnea roughly coincident with the spring pollen season. Doubt infection. It is time to redefine her lung function status. Plan-chest x-ray, 6 minute walk test, schedule PFT try sample Anoro inhaler for comparison

## 2014-06-15 NOTE — Assessment & Plan Note (Signed)
Continue efforts to engage her in real smoking cessation effort. Support offered.

## 2014-06-15 NOTE — Assessment & Plan Note (Signed)
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 Management is the same. We again counseled on sleep hygiene, adequate sleep including naps, driving responsibility, appropriate use of stimulant medication. Plan-continue Nuvigil

## 2014-06-27 ENCOUNTER — Other Ambulatory Visit: Payer: Self-pay | Admitting: Family Medicine

## 2014-06-27 NOTE — Telephone Encounter (Signed)
Med filled.  

## 2014-07-10 ENCOUNTER — Ambulatory Visit: Payer: 59

## 2014-07-18 ENCOUNTER — Ambulatory Visit (INDEPENDENT_AMBULATORY_CARE_PROVIDER_SITE_OTHER): Payer: 59 | Admitting: Internal Medicine

## 2014-07-18 DIAGNOSIS — J449 Chronic obstructive pulmonary disease, unspecified: Secondary | ICD-10-CM | POA: Diagnosis not present

## 2014-07-18 DIAGNOSIS — R0609 Other forms of dyspnea: Secondary | ICD-10-CM | POA: Diagnosis not present

## 2014-07-18 DIAGNOSIS — J432 Centrilobular emphysema: Secondary | ICD-10-CM

## 2014-07-18 DIAGNOSIS — R0600 Dyspnea, unspecified: Secondary | ICD-10-CM

## 2014-07-18 DIAGNOSIS — J441 Chronic obstructive pulmonary disease with (acute) exacerbation: Secondary | ICD-10-CM

## 2014-07-18 LAB — PULMONARY FUNCTION TEST
DL/VA % pred: 48 %
DL/VA: 2.22 ml/min/mmHg/L
DLCO UNC % PRED: 35 %
DLCO unc: 7.94 ml/min/mmHg
FEF 25-75 POST: 0.83 L/s
FEF 25-75 Pre: 0.42 L/sec
FEF2575-%Change-Post: 98 %
FEF2575-%Pred-Post: 35 %
FEF2575-%Pred-Pre: 17 %
FEV1-%CHANGE-POST: 32 %
FEV1-%PRED-PRE: 37 %
FEV1-%Pred-Post: 48 %
FEV1-POST: 1.21 L
FEV1-PRE: 0.92 L
FEV1FVC-%Change-Post: 3 %
FEV1FVC-%Pred-Pre: 62 %
FEV6-%CHANGE-POST: 29 %
FEV6-%Pred-Post: 77 %
FEV6-%Pred-Pre: 59 %
FEV6-PRE: 1.84 L
FEV6-Post: 2.38 L
FEV6FVC-%CHANGE-POST: 0 %
FEV6FVC-%PRED-POST: 102 %
FEV6FVC-%Pred-Pre: 101 %
FVC-%Change-Post: 28 %
FVC-%Pred-Post: 75 %
FVC-%Pred-Pre: 58 %
FVC-Post: 2.4 L
FVC-Pre: 1.87 L
POST FEV1/FVC RATIO: 51 %
Post FEV6/FVC ratio: 99 %
Pre FEV1/FVC ratio: 49 %
Pre FEV6/FVC Ratio: 98 %
RV % pred: 142 %
RV: 2.65 L
TLC % pred: 95 %
TLC: 4.62 L

## 2014-07-18 NOTE — Progress Notes (Signed)
PFT done today. 

## 2014-07-20 NOTE — Progress Notes (Signed)
Documentation for 6 minute walk test 

## 2014-07-23 ENCOUNTER — Telehealth: Payer: Self-pay | Admitting: Internal Medicine

## 2014-07-23 ENCOUNTER — Other Ambulatory Visit: Payer: Self-pay | Admitting: Internal Medicine

## 2014-07-23 NOTE — Telephone Encounter (Signed)
Take Anoro off her med list- That was just a sample Change Dulera to Symbicort 160, # 1 refill x 11     2 puffs then rinse mouth, twice daily  Change Nuvigil to Provigil 200 mg, # 30, 1 daily as directed   Refill x 5

## 2014-07-23 NOTE — Telephone Encounter (Signed)
lmomtcb x1 for pt 

## 2014-07-23 NOTE — Telephone Encounter (Signed)
Per [facility] dulera is not covered but symbicort is. nuvigil is not covered by provigil is. Please advise Dr. Annamaria Boots thanks  . Current Outpatient Prescriptions on File Prior to Visit  Medication Sig Dispense Refill  . albuterol (PROVENTIL HFA;VENTOLIN HFA) 108 (90 BASE) MCG/ACT inhaler Inhale 2 puffs into the lungs every 4 (four) hours as needed for wheezing. 1 Inhaler prn  . Armodafinil (NUVIGIL) 250 MG tablet Take 1 tablet (250 mg total) by mouth daily. 30 tablet 5  . atorvastatin (LIPITOR) 20 MG tablet Take 1 tablet (20 mg total) by mouth daily. 30 tablet 6  . buPROPion (WELLBUTRIN XL) 150 MG 24 hr tablet TAKE 1 TABLET BY MOUTH DAILY 30 tablet 3  . escitalopram (LEXAPRO) 20 MG tablet TAKE 1 TABLET BY MOUTH DAILY. 30 tablet 3  . estradiol (VIVELLE-DOT) 0.1 MG/24HR patch APPLY 1 PATCH TO SKIN TWICE WEEKLY AS DIRECTED    . fexofenadine (ALLEGRA) 180 MG tablet Take 1 tablet (180 mg total) by mouth daily. 30 tablet 5  . ipratropium-albuterol (DUONEB) 0.5-2.5 (3) MG/3ML SOLN Take 3 mLs by nebulization every 6 (six) hours as needed. 360 mL 5  . losartan-hydrochlorothiazide (HYZAAR) 100-25 MG per tablet TAKE 1 TABLET BY MOUTH DAILY 30 tablet 6  . mometasone-formoterol (DULERA) 100-5 MCG/ACT AERO Inhale 2 puffs into the lungs 2 (two) times daily. 1 Inhaler prn  . montelukast (SINGULAIR) 10 MG tablet Take 1 tablet (10 mg total) by mouth at bedtime. 30 tablet prn  . omeprazole (PRILOSEC) 40 MG capsule TAKE 1 CAPSULE BY MOUTH DAILY. 30 capsule 6  . potassium chloride SA (K-DUR,KLOR-CON) 20 MEQ tablet TAKE 1 TABLET BY MOUTH DAILY. 30 tablet 6  . SUMAtriptan (IMITREX) 100 MG tablet Take as needed for migraines.     . tiotropium (SPIRIVA) 18 MCG inhalation capsule Place 1 capsule (18 mcg total) into inhaler and inhale daily. 30 capsule prn  . Umeclidinium-Vilanterol (ANORO ELLIPTA) 62.5-25 MCG/INH AEPB Inhale 1 puff into the lungs daily. 1 each 0  . Vitamin D, Ergocalciferol, (DRISDOL) 50000 UNITS  CAPS capsule Take 1 capsule (50,000 Units total) by mouth every 7 (seven) days. 12 capsule 0  . zolmitriptan (ZOMIG) 5 MG nasal solution Take as directed as needed for migraines      No current facility-administered medications on file prior to visit.

## 2014-07-24 NOTE — Telephone Encounter (Signed)
lmomtcb x 2  

## 2014-07-25 ENCOUNTER — Telehealth: Payer: Self-pay | Admitting: Internal Medicine

## 2014-07-25 MED ORDER — MODAFINIL 200 MG PO TABS: 200.0000 mg | ORAL_TABLET | Freq: Every day | ORAL | Status: DC

## 2014-07-25 MED ORDER — BUDESONIDE-FORMOTEROL FUMARATE 160-4.5 MCG/ACT IN AERO: 2.0000 | INHALATION_SPRAY | Freq: Two times a day (BID) | RESPIRATORY_TRACT | Status: DC

## 2014-07-25 NOTE — Telephone Encounter (Signed)
  Deneise Lever, MD at 07/23/2014 2:57 PM     Status: Signed       Expand All Collapse All   Take Anoro off her med list- That was just a sample Change Dulera to Symbicort 160, # 1 refill x 11 2 puffs then rinse mouth, twice daily  Change Nuvigil to Provigil 200 mg, # 30, 1 daily as directed Refill x 5       Patient aware of changes to medication list.  Rxs have been called into pharmacy.  Nothing further needed.

## 2014-07-25 NOTE — Telephone Encounter (Signed)
lmtcb x3 for pt. Per our protocol, this message will be closed. If/when pt calls back a new message will need to be created.

## 2014-09-26 ENCOUNTER — Ambulatory Visit: Payer: 59 | Admitting: Family Medicine

## 2014-09-26 ENCOUNTER — Ambulatory Visit (INDEPENDENT_AMBULATORY_CARE_PROVIDER_SITE_OTHER): Payer: 59 | Admitting: Physician Assistant

## 2014-09-26 ENCOUNTER — Encounter: Payer: Self-pay | Admitting: Physician Assistant

## 2014-09-26 VITALS — BP 104/62 | HR 68 | Temp 98.1°F | Resp 22 | Wt 126.4 lb

## 2014-09-26 DIAGNOSIS — J441 Chronic obstructive pulmonary disease with (acute) exacerbation: Secondary | ICD-10-CM | POA: Diagnosis not present

## 2014-09-26 MED ORDER — BENZONATATE 200 MG PO CAPS: 200.0000 mg | ORAL_CAPSULE | Freq: Two times a day (BID) | ORAL | Status: DC | PRN

## 2014-09-26 MED ORDER — DOXYCYCLINE HYCLATE 100 MG PO CAPS: 100.0000 mg | ORAL_CAPSULE | Freq: Two times a day (BID) | ORAL | Status: DC

## 2014-09-26 MED ORDER — PREDNISONE 20 MG PO TABS: 40.0000 mg | ORAL_TABLET | Freq: Every day | ORAL | Status: DC

## 2014-09-26 MED ORDER — IPRATROPIUM-ALBUTEROL 0.5-2.5 (3) MG/3ML IN SOLN: 3.0000 mL | Freq: Once | RESPIRATORY_TRACT | Status: DC

## 2014-09-26 NOTE — Patient Instructions (Signed)
Please take the Doxycycline (antibiotic) as directed. Stay hydrated and rest. Use Mucinex-DM for cough. Continue COPD medications as directed. Take the prednisone for 5 days as directed.  Follow-up on Monday. If anything worsens over the weekend, please go to the ER.

## 2014-09-26 NOTE — Assessment & Plan Note (Signed)
With acute bacterial bronchitis. Duoneb x 1 given in office with improvement in wheezing. Rx Doxycycline. Rx Tessalon. Rx 5-day 40 mg prednisone burst. Continue COPD medications as directed. Supportive measures reviewed. Follow-up on Monday.

## 2014-09-26 NOTE — Progress Notes (Signed)
Patient with history of COPD (emphysema predominant) presents to clinic today c/o 4 days of worsening productive cough, chest congestion, sinus congestion and headache, SOB and wheezing. Denies fever, chest pain, recent travel or sick contact. Is taking COPD medications as directed. Has not had SABA or Duoneb this morning.   Past Medical History  Diagnosis Date  . Childhood asthma   . GERD (gastroesophageal reflux disease)   . Allergic rhinitis   . Hypertension   . RBBB (right bundle branch block with left anterior fascicular block)   . Exposure to TB     uncle-her PPD neg  . Chronic headaches   . Acute bronchitis   . Complication of anesthesia     woke up during surgery x 1  . Complication of anesthesia     hard to wake up x 1  . COPD (chronic obstructive pulmonary disease)     emphysema    Current Outpatient Prescriptions on File Prior to Visit  Medication Sig Dispense Refill  . albuterol (PROVENTIL HFA;VENTOLIN HFA) 108 (90 BASE) MCG/ACT inhaler Inhale 2 puffs into the lungs every 4 (four) hours as needed for wheezing. 1 Inhaler prn  . budesonide-formoterol (SYMBICORT) 160-4.5 MCG/ACT inhaler Inhale 2 puffs into the lungs 2 (two) times daily. 1 Inhaler 11  . buPROPion (WELLBUTRIN XL) 150 MG 24 hr tablet TAKE 1 TABLET BY MOUTH DAILY 30 tablet 3  . escitalopram (LEXAPRO) 20 MG tablet TAKE 1 TABLET BY MOUTH DAILY. 30 tablet 3  . estradiol (VIVELLE-DOT) 0.1 MG/24HR patch APPLY 1 PATCH TO SKIN TWICE WEEKLY AS DIRECTED    . fexofenadine (ALLEGRA) 180 MG tablet Take 1 tablet (180 mg total) by mouth daily. 30 tablet 5  . ipratropium-albuterol (DUONEB) 0.5-2.5 (3) MG/3ML SOLN Take 3 mLs by nebulization every 6 (six) hours as needed. 360 mL 5  . losartan-hydrochlorothiazide (HYZAAR) 100-25 MG per tablet TAKE 1 TABLET BY MOUTH DAILY 30 tablet 6  . modafinil (PROVIGIL) 200 MG tablet Take 1 tablet (200 mg total) by mouth daily. 30 tablet 5  . montelukast (SINGULAIR) 10 MG tablet Take 1  tablet (10 mg total) by mouth at bedtime. 30 tablet prn  . omeprazole (PRILOSEC) 40 MG capsule TAKE 1 CAPSULE BY MOUTH DAILY. 30 capsule 6  . potassium chloride SA (K-DUR,KLOR-CON) 20 MEQ tablet TAKE 1 TABLET BY MOUTH DAILY. 30 tablet 6  . SPIRIVA HANDIHALER 18 MCG inhalation capsule INHALE THE CONTENTS OF 1 CAPSULE ONCE DAILY AS DIRECTED. 30 capsule 5  . SUMAtriptan (IMITREX) 100 MG tablet Take as needed for migraines.     . Vitamin D, Ergocalciferol, (DRISDOL) 50000 UNITS CAPS capsule Take 1 capsule (50,000 Units total) by mouth every 7 (seven) days. 12 capsule 0  . zolmitriptan (ZOMIG) 5 MG nasal solution Take as directed as needed for migraines     . atorvastatin (LIPITOR) 20 MG tablet Take 1 tablet (20 mg total) by mouth daily. (Patient not taking: Reported on 09/26/2014) 30 tablet 6   No current facility-administered medications on file prior to visit.    Allergies  Allergen Reactions  . Clarithromycin     Rash   . Codeine   . Hydrocodone-Homatropine     paranoia  . Penicillins   . Topiramate     Other reaction(s): WEIGHT LOSS    Family History  Problem Relation Age of Onset  . Emphysema Mother   . Heart attack Mother   . Heart failure Father   . Asthma Sister   . Asthma  Maternal Uncle   . Arthritis Maternal Grandmother   . Cancer Maternal Grandmother   . Cancer Sister   . Cancer      aunts  . Cancer      uncles    Social History   Social History  . Marital Status: Married    Spouse Name: N/A  . Number of Children: N/A  . Years of Education: N/A   Occupational History  . accounting tech     [employer]   Social History Main Topics  . Smoking status: Current Every Day Smoker -- 0.80 packs/day for 40 years    Types: Cigarettes  . Smokeless tobacco: Never Used     Comment: Bad day 6-7 cigs a day  . Alcohol Use: No  . Drug Use: Not on file  . Sexual Activity: Not on file   Other Topics Concern  . Not on file   Social History  Narrative   Review of Systems - See HPI.  All other ROS are negative.  BP 104/62 mmHg  Pulse 68  Temp(Src) 98.1 F (36.7 C) (Oral)  Resp 22  Wt 126 lb 6.4 oz (57.335 kg)  SpO2 96%  Physical Exam  Constitutional: She is oriented to person, place, and time and well-developed, well-nourished, and in no distress.  HENT:  Head: Normocephalic and atraumatic.  Eyes: Conjunctivae are normal.  Neck: Neck supple.  Cardiovascular: Normal rate, regular rhythm, normal heart sounds and intact distal pulses.   Pulmonary/Chest: No respiratory distress. She has wheezes. She has no rales. She exhibits no tenderness.  Neurological: She is alert and oriented to person, place, and time.  Skin: Skin is warm and dry. No rash noted.  Psychiatric: Affect normal.  Vitals reviewed.   Recent Results (from the past 2160 hour(s))  Pulmonary Function Test     Status: None   Collection Time: 07/18/14 11:26 AM  Result Value Ref Range   FVC-Pre 1.87 L   FVC-%Pred-Pre 58 %   FVC-Post 2.40 L   FVC-%Pred-Post 75 %   FVC-%Change-Post 28 %   FEV1-Pre 0.92 L   FEV1-%Pred-Pre 37 %   FEV1-Post 1.21 L   FEV1-%Pred-Post 48 %   FEV1-%Change-Post 32 %   FEV6-Pre 1.84 L   FEV6-%Pred-Pre 59 %   FEV6-Post 2.38 L   FEV6-%Pred-Post 77 %   FEV6-%Change-Post 29 %   Pre FEV1/FVC ratio 49 %   FEV1FVC-%Pred-Pre 62 %   Post FEV1/FVC ratio 51 %   FEV1FVC-%Change-Post 3 %   Pre FEV6/FVC Ratio 98 %   FEV6FVC-%Pred-Pre 101 %   Post FEV6/FVC ratio 99 %   FEV6FVC-%Pred-Post 102 %   FEV6FVC-%Change-Post 0 %   FEF 25-75 Pre 0.42 L/sec   FEF2575-%Pred-Pre 17 %   FEF 25-75 Post 0.83 L/sec   FEF2575-%Pred-Post 35 %   FEF2575-%Change-Post 98 %   RV 2.65 L   RV % pred 142 %   TLC 4.62 L   TLC % pred 95 %   DLCO unc 7.94 ml/min/mmHg   DLCO unc % pred 35 %   DL/VA 2.22 ml/min/mmHg/L   DL/VA % pred 48 %    Assessment/Plan: COPD exacerbation With acute bacterial bronchitis. Duoneb x 1 given in office with improvement  in wheezing. Rx Doxycycline. Rx Tessalon. Rx 5-day 40 mg prednisone burst. Continue COPD medications as directed. Supportive measures reviewed. Follow-up on Monday.

## 2014-09-26 NOTE — Progress Notes (Signed)
Pre visit review using our clinic review tool, if applicable. No additional management support is needed unless otherwise documented below in the visit note. 

## 2014-09-29 ENCOUNTER — Ambulatory Visit (INDEPENDENT_AMBULATORY_CARE_PROVIDER_SITE_OTHER): Payer: 59 | Admitting: Physician Assistant

## 2014-09-29 ENCOUNTER — Encounter: Payer: Self-pay | Admitting: Physician Assistant

## 2014-09-29 ENCOUNTER — Ambulatory Visit: Payer: 59 | Admitting: Physician Assistant

## 2014-09-29 VITALS — BP 120/80 | HR 85 | Temp 98.2°F | Resp 16 | Ht 62.0 in | Wt 125.1 lb

## 2014-09-29 DIAGNOSIS — J441 Chronic obstructive pulmonary disease with (acute) exacerbation: Secondary | ICD-10-CM | POA: Diagnosis not present

## 2014-09-29 MED ORDER — IPRATROPIUM-ALBUTEROL 0.5-2.5 (3) MG/3ML IN SOLN
3.0000 mL | Freq: Four times a day (QID) | RESPIRATORY_TRACT | Status: DC
  Administered 2014-09-29: 3 mL via RESPIRATORY_TRACT

## 2014-09-29 NOTE — Progress Notes (Signed)
Patient presents to clinic today for follow-up of COPD exacerbation. Was seen on 09/26/14 and given Rx doxycycline and burst of prednisone. Is taking as directed and starting to notice some improvement.  Still with productive cough and fatigue but breathing is better. Denies SOB/chest pain.   Past Medical History  Diagnosis Date  . Childhood asthma   . GERD (gastroesophageal reflux disease)   . Allergic rhinitis   . Hypertension   . RBBB (right bundle branch block with left anterior fascicular block)   . Exposure to TB     uncle-her PPD neg  . Chronic headaches   . Acute bronchitis   . Complication of anesthesia     woke up during surgery x 1  . Complication of anesthesia     hard to wake up x 1  . COPD (chronic obstructive pulmonary disease)     emphysema    Current Outpatient Prescriptions on File Prior to Visit  Medication Sig Dispense Refill  . albuterol (PROVENTIL HFA;VENTOLIN HFA) 108 (90 BASE) MCG/ACT inhaler Inhale 2 puffs into the lungs every 4 (four) hours as needed for wheezing. 1 Inhaler prn  . atorvastatin (LIPITOR) 20 MG tablet Take 1 tablet (20 mg total) by mouth daily. 30 tablet 6  . benzonatate (TESSALON) 200 MG capsule Take 1 capsule (200 mg total) by mouth 2 (two) times daily as needed for cough. 20 capsule 0  . budesonide-formoterol (SYMBICORT) 160-4.5 MCG/ACT inhaler Inhale 2 puffs into the lungs 2 (two) times daily. 1 Inhaler 11  . buPROPion (WELLBUTRIN XL) 150 MG 24 hr tablet TAKE 1 TABLET BY MOUTH DAILY 30 tablet 3  . doxycycline (VIBRAMYCIN) 100 MG capsule Take 1 capsule (100 mg total) by mouth 2 (two) times daily. 20 capsule 0  . escitalopram (LEXAPRO) 20 MG tablet TAKE 1 TABLET BY MOUTH DAILY. 30 tablet 3  . estradiol (VIVELLE-DOT) 0.1 MG/24HR patch APPLY 1 PATCH TO SKIN TWICE WEEKLY AS DIRECTED    . fexofenadine (ALLEGRA) 180 MG tablet Take 1 tablet (180 mg total) by mouth daily. 30 tablet 5  . ipratropium-albuterol (DUONEB) 0.5-2.5 (3) MG/3ML SOLN Take  3 mLs by nebulization every 6 (six) hours as needed. 360 mL 5  . losartan-hydrochlorothiazide (HYZAAR) 100-25 MG per tablet TAKE 1 TABLET BY MOUTH DAILY 30 tablet 6  . modafinil (PROVIGIL) 200 MG tablet Take 1 tablet (200 mg total) by mouth daily. 30 tablet 5  . montelukast (SINGULAIR) 10 MG tablet Take 1 tablet (10 mg total) by mouth at bedtime. 30 tablet prn  . omeprazole (PRILOSEC) 40 MG capsule TAKE 1 CAPSULE BY MOUTH DAILY. 30 capsule 6  . potassium chloride SA (K-DUR,KLOR-CON) 20 MEQ tablet TAKE 1 TABLET BY MOUTH DAILY. 30 tablet 6  . predniSONE (DELTASONE) 20 MG tablet Take 2 tablets (40 mg total) by mouth daily with breakfast. 10 tablet 0  . SPIRIVA HANDIHALER 18 MCG inhalation capsule INHALE THE CONTENTS OF 1 CAPSULE ONCE DAILY AS DIRECTED. 30 capsule 5  . SUMAtriptan (IMITREX) 100 MG tablet Take as needed for migraines.     . Vitamin D, Ergocalciferol, (DRISDOL) 50000 UNITS CAPS capsule Take 1 capsule (50,000 Units total) by mouth every 7 (seven) days. 12 capsule 0  . zolmitriptan (ZOMIG) 5 MG nasal solution Take as directed as needed for migraines      No current facility-administered medications on file prior to visit.    Allergies  Allergen Reactions  . Clarithromycin     Rash   . Codeine   .  Hydrocodone-Homatropine     paranoia  . Penicillins   . Topiramate     Other reaction(s): WEIGHT LOSS    Family History  Problem Relation Age of Onset  . Emphysema Mother   . Heart attack Mother   . Heart failure Father   . Asthma Sister   . Asthma Maternal Uncle   . Arthritis Maternal Grandmother   . Cancer Maternal Grandmother   . Cancer Sister   . Cancer      aunts  . Cancer      uncles    Social History   Social History  . Marital Status: Married    Spouse Name: N/A  . Number of Children: N/A  . Years of Education: N/A   Occupational History  . accounting tech     [employer]   Social History Main Topics  . Smoking status: Current Every  Day Smoker -- 0.80 packs/day for 40 years    Types: Cigarettes  . Smokeless tobacco: Never Used     Comment: Bad day 6-7 cigs a day  . Alcohol Use: No  . Drug Use: None  . Sexual Activity: Not Asked   Other Topics Concern  . None   Social History Narrative   Review of Systems - See HPI.  All other ROS are negative.  BP 120/80 mmHg  Pulse 85  Temp(Src) 98.2 F (36.8 C) (Oral)  Resp 16  Ht 5\' 2"  (1.575 m)  Wt 125 lb 2 oz (56.756 kg)  BMI 22.88 kg/m2  SpO2 97%  Physical Exam  Constitutional: She is oriented to person, place, and time and well-developed, well-nourished, and in no distress.  HENT:  Head: Normocephalic and atraumatic.  Right Ear: External ear normal.  Left Ear: External ear normal.  Nose: Nose normal.  Mouth/Throat: Oropharynx is clear and moist. No oropharyngeal exudate.  TM within normal limits bilaterally.  Eyes: Conjunctivae are normal.  Neck: Neck supple.  Cardiovascular: Normal rate, regular rhythm, normal heart sounds and intact distal pulses.   Pulmonary/Chest: No respiratory distress. She has wheezes. She has no rales. She exhibits no tenderness.  Neurological: She is alert and oriented to person, place, and time.  Skin: Skin is warm and dry. No rash noted.  Psychiatric: Affect normal.  Vitals reviewed.   Recent Results (from the past 2160 hour(s))  Pulmonary Function Test     Status: None   Collection Time: 07/18/14 11:26 AM  Result Value Ref Range   FVC-Pre 1.87 L   FVC-%Pred-Pre 58 %   FVC-Post 2.40 L   FVC-%Pred-Post 75 %   FVC-%Change-Post 28 %   FEV1-Pre 0.92 L   FEV1-%Pred-Pre 37 %   FEV1-Post 1.21 L   FEV1-%Pred-Post 48 %   FEV1-%Change-Post 32 %   FEV6-Pre 1.84 L   FEV6-%Pred-Pre 59 %   FEV6-Post 2.38 L   FEV6-%Pred-Post 77 %   FEV6-%Change-Post 29 %   Pre FEV1/FVC ratio 49 %   FEV1FVC-%Pred-Pre 62 %   Post FEV1/FVC ratio 51 %   FEV1FVC-%Change-Post 3 %   Pre FEV6/FVC Ratio 98 %   FEV6FVC-%Pred-Pre 101 %   Post FEV6/FVC  ratio 99 %   FEV6FVC-%Pred-Post 102 %   FEV6FVC-%Change-Post 0 %   FEF 25-75 Pre 0.42 L/sec   FEF2575-%Pred-Pre 17 %   FEF 25-75 Post 0.83 L/sec   FEF2575-%Pred-Post 35 %   FEF2575-%Change-Post 98 %   RV 2.65 L   RV % pred 142 %   TLC 4.62 L  TLC % pred 95 %   DLCO unc 7.94 ml/min/mmHg   DLCO unc % pred 35 %   DL/VA 2.22 ml/min/mmHg/L   DL/VA % pred 48 %    Assessment/Plan: COPD exacerbation Duoneb given x 1. Vitals and exam improved from last check. Finish steroid and antibiotic. Continue supportive measures. Follow-up with Pulmonology on Thursday as scheduled.

## 2014-09-29 NOTE — Progress Notes (Signed)
Pre visit review using our clinic review tool, if applicable. No additional management support is needed unless otherwise documented below in the visit note/SLS  

## 2014-09-29 NOTE — Patient Instructions (Signed)
Please finish the steroid and the antibiotic as directed. Increase fluids. Continue chronic medications as directed. Follow-up with Dr. Annamaria Boots (Pulmonology) this Thursday as directed.

## 2014-09-29 NOTE — Assessment & Plan Note (Signed)
Duoneb given x 1. Vitals and exam improved from last check. Finish steroid and antibiotic. Continue supportive measures. Follow-up with Pulmonology on Thursday as scheduled.

## 2014-10-02 ENCOUNTER — Encounter: Payer: Self-pay | Admitting: *Deleted

## 2014-10-02 ENCOUNTER — Ambulatory Visit (INDEPENDENT_AMBULATORY_CARE_PROVIDER_SITE_OTHER): Payer: 59 | Admitting: Internal Medicine

## 2014-10-02 ENCOUNTER — Encounter: Payer: Self-pay | Admitting: Internal Medicine

## 2014-10-02 VITALS — BP 130/74 | HR 76 | Ht 62.0 in | Wt 125.0 lb

## 2014-10-02 DIAGNOSIS — J441 Chronic obstructive pulmonary disease with (acute) exacerbation: Secondary | ICD-10-CM

## 2014-10-02 DIAGNOSIS — J449 Chronic obstructive pulmonary disease, unspecified: Secondary | ICD-10-CM

## 2014-10-02 MED ORDER — TRAMADOL HCL 50 MG PO TABS: 50.0000 mg | ORAL_TABLET | Freq: Four times a day (QID) | ORAL | Status: DC | PRN

## 2014-10-02 MED ORDER — LEVALBUTEROL HCL 0.63 MG/3ML IN NEBU
0.6300 mg | INHALATION_SOLUTION | Freq: Once | RESPIRATORY_TRACT | Status: AC
  Administered 2014-10-02: 0.63 mg via RESPIRATORY_TRACT

## 2014-10-02 MED ORDER — METHYLPREDNISOLONE ACETATE 80 MG/ML IJ SUSP
80.0000 mg | Freq: Once | INTRAMUSCULAR | Status: AC
  Administered 2014-10-02: 80 mg via INTRAMUSCULAR

## 2014-10-02 MED ORDER — CEFDINIR 300 MG PO CAPS: 300.0000 mg | ORAL_CAPSULE | Freq: Two times a day (BID) | ORAL | Status: DC

## 2014-10-02 NOTE — Progress Notes (Signed)
Patient ID: Carol Quinn, female    DOB: 02/27/1955, 59 y.o.   MRN: [id number]  HPI 69 yoF former smoker followed for COPD and hypersomnia. Last here Sept 19, when she had been sick after a cruise. She did gradually get better. Comes now- to f/u after MSLT done for assessment of her persistent c/o "tired". Today she also says she has been having some sore throat this week w/o fever. Some dry cough. Denies fever or nodes. Lab- MSLT 04/28/10- short at 4.1 minutes, SOREM 0, so nonspecific, c/w primary ideopathic hypersomnia.  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 Still has a sense of fatigue, feeling she can't put one foot in front of another,  and easy confusion. Caffeine has no effect. Thyroid has been checked a lot. Sister has lupus, but she has tested negative.  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 We have again discussed motivation and support for quitting . Acute visit- Called 09/13/10 - On call Dr. gave prednisone taper and Zpak for 4 day of hx cough, green sputum and SOB. 1 day fever. No better so far with this Rx. Using her inhaler more.   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 Continues to smoke against advice. Hydromet cough syrup made her "paranoid"-she cried and screamed. 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 Nuvigil has allowed her to get up and get to work on a regular basis while sleeping soundly at night. If she skips a dose she expects to sleep all day. Her breathing has been stable with Symbicort. She rarely needs a rescue inhaler. She has had no acute respiratory illness  recently.  04/06/2011 Acute OV  Complains of prod cough with brown mucus, increased SOB, wheezing, tightness in chest x1.5week, progessively getting worse.  Has been using over-the-counter cough products, without any relief. Plays increased wheezing, and shortness of breath over last few days. We talked adeptly about smoking cessation. She denies any hemoptysis, chest pain, orthopnea, PND, or leg swelling. >>tx w/ Omnicef/Prednisone taper   04/15/2011 [facility]  Patient returns for a post hospitalization visit. Patient was admitted March 29- 04/10/2011 for a COPD exacerbation. Patient presented with progressively worsening cough, congestion, and wheezing. She was treated with IV antibiotics, steroids, and aggressive nebulizer treatments. X-ray showed no acute changes. Once again, she was recommended to quit smoking. She was discharged on Avelox and a prednisone taper. Since discharge. Patient says she is still feeling weak with no energy.  No smoking x 1 week. Has 2 days left on Avelox and Prednisone .  Discussed smoking cesstation.   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 Avelox helped. Back at work, off of prednisone and antibiotics with no cough. Smoking an electronic cigarette now. We discussed smoking cessation again. 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 Chest tight, racing heart no increased use of bronchodilator. She continues Nuvigil "very helpful" for help in her stay awake at work. Trying to get adequate sleep at night. COPD assessment test  (CAT) score 32/40.  01/16/11 Acute OV  Complains of prod cough with brown/green mucus, hoarseness, increased SOB, wheezing, tightness in chest, back pain, chills x2weeks.  finished levaquin yesterday.  Called in levaquin 500mg  x 7 days on 12/30 for above symptoms Some better but not gone completely , still has thick mucus , green at time.  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 Pt c/o chest congestion (not bad right now), she has good and bad days with her breathing, slight cough w/ cream color phlegm, chest tx at times. Increased cough over the past month and chest tightness and postnasal drip. Rescue inhaler and Symbicort have been sufficient. Nebulizer helps in the winter time. Still smoking against advice. Nuvigil still works well for her.  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 Denies fever, chills, sore throat or GI upset. Last got doxycycline and prednisone in June. Is not trying to stop smoking. We discussed this again. CXR 04/07/12 IMPRESSION:  Emphysema without acute cardiopulmonary disease.  Original Report Authenticated By: Dereck Ligas, M.D.  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 PFT 06/27/08- FEV1/FVC 0.51, DLCO 49% severe COPD a1AT 04/19/10- MM nl CXR 05/17/13- mild hyperV, NAD Mostly dyspnea on exertion but notices some dry cough in the last 3 weeks. Always feels tired. She is aware of diagnosis of emphysema and admits stress. Myocardial perfusion imaging study was normal.  she uses her nebulizer machine mostly with if she has a cold. Nuvigil has been a big help with daytime fatigue.  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 Her primary office gave prednisone for a few days, benzonatate, doxycycline and a neb treatment. She has been out of work all week. PFT-07/18/2014-severe obstructive airways disease with response to bronchodilator. FEV1 1.21/48% (+32%), FEV1/FVC 0.51, TLC 95%, DLCO 35% 6MWT 07/18/14- 96%, 98%, 98%, 312 m. Could only walk 4 minutes-stopped because of shortness of breath and dizziness CXR 05/29/14 IMPRESSION: Emphysema without acute disease. Electronically Signed  By: Inge Rise M.D.  On: 05/30/2014 09:12  ROS-see HPI Constitutional:   No-   weight loss, night sweats, fevers, chills, +fatigue, lassitude. HEENT:   No-  headaches, difficulty swallowing, tooth/dental problems, sore throat,       No-  sneezing, itching, ear ache, nasal congestion, +post nasal drip,  CV:  No-   chest pain, orthopnea, PND, swelling in lower extremities, anasarca, dizziness, palpitations Resp: + shortness of breath with exertion or at rest.              + productive cough,  No non-productive cough,  No- coughing up  of blood.              + change in color of mucus.  No- wheezing.   Skin: No-   rash or lesions. GI:  No-   heartburn, indigestion, abdominal pain, nausea, vomiting,  GU:  MS:  No-   joint pain or swelling.   Neuro-     nothing unusual Psych:  No- change in mood or affect. No depression or anxiety.  No memory loss.  OBJ- Physical Exam General- Alert, Oriented, Affect-appropriate, Distress-obviously doesn't feel  well. Skin- rash-none, lesions- none, excoriation- none Lymphadenopathy- none Head- atraumatic            Eyes- Gross vision intact, PERRLA, conjunctivae and secretions clear            Ears- Hearing, canals-normal            Nose- +turbinate edema, no-Septal dev, mucus, polyps, erosion, perforation             Throat- Mallampati II , mucosa clear/ not red , drainage- none, tonsils- atrophic Neck- flexible , trachea midline, no stridor , thyroid nl, carotid no bruit Chest - symmetrical excursion , unlabored           Heart/CV- RRR , no murmur , no gallop  , no rub, nl s1 s2                           - JVD- none , edema- none, stasis changes- none, varices- none           Lung-  wheeze + mild, + deep bronchitic , dullness-none, rub- none           Chest wall-  Abd-  Br/ Gen/ Rectal- Not done, not indicated Extrem- cyanosis- none, clubbing, none, atrophy- none, strength- nl Neuro- grossly intact to observation

## 2014-10-02 NOTE — Patient Instructions (Addendum)
Depo 80  Neb xop 0.63  Script for tramadol for cough  Script for Cefdinir antibiotic   You can also use Mucinex-DM if you want  Wait on flu shot till you are feeling better- Call  Ahead when you can come  Leave of Absence note for work- 9/16 until return 10/06/14 for acute illness  [facility] Parking form completed

## 2014-10-03 NOTE — Assessment & Plan Note (Signed)
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

## 2014-10-03 NOTE — Assessment & Plan Note (Signed)
PFT scores are drifting down consistent with progression of mixed type COPD. I discussed this as an extra pressure on need never to smoke again. She is still working but will be a candidate for pulmonary rehabilitation. Eventually she will need oxygen.

## 2014-10-17 ENCOUNTER — Other Ambulatory Visit: Payer: Self-pay | Admitting: Family Medicine

## 2014-10-17 NOTE — Telephone Encounter (Signed)
Rx sent to the pharmacy by e-script.//AB/CMA 

## 2014-10-28 ENCOUNTER — Other Ambulatory Visit: Payer: Self-pay | Admitting: Family Medicine

## 2014-10-28 NOTE — Telephone Encounter (Signed)
Medication filled to pharmacy as requested.   

## 2014-11-17 ENCOUNTER — Other Ambulatory Visit: Payer: Self-pay | Admitting: Family Medicine

## 2014-11-18 NOTE — Telephone Encounter (Signed)
Medication filled to pharmacy as requested. Pt needs a BP follow up

## 2014-12-03 ENCOUNTER — Other Ambulatory Visit: Payer: Self-pay

## 2014-12-03 DIAGNOSIS — Z1231 Encounter for screening mammogram for malignant neoplasm of breast: Secondary | ICD-10-CM

## 2014-12-11 LAB — HM PAP SMEAR: HM Pap smear: NORMAL

## 2014-12-18 ENCOUNTER — Other Ambulatory Visit: Payer: Self-pay | Admitting: Family Medicine

## 2014-12-18 NOTE — Telephone Encounter (Signed)
Medication filled to pharmacy as requested.  Pt in need of a Blood Pressure follow up.

## 2015-01-08 ENCOUNTER — Ambulatory Visit
Admission: RE | Admit: 2015-01-08 | Discharge: 2015-01-08 | Disposition: A | Discharge status: Home / Self Care | Payer: 59 | Source: Ambulatory Visit

## 2015-01-08 DIAGNOSIS — Z1231 Encounter for screening mammogram for malignant neoplasm of breast: Secondary | ICD-10-CM

## 2015-01-21 ENCOUNTER — Other Ambulatory Visit: Payer: Self-pay | Admitting: Family Medicine

## 2015-01-21 ENCOUNTER — Other Ambulatory Visit: Payer: Self-pay | Admitting: Internal Medicine

## 2015-01-21 NOTE — Telephone Encounter (Signed)
Refill request for Provigil. Please advise. Thank you.

## 2015-01-21 NOTE — Telephone Encounter (Signed)
Called pt to schedule CPE 03/04/2015. Refills provided until scheduled OV.

## 2015-01-22 NOTE — Telephone Encounter (Signed)
Ok to refill 

## 2015-02-05 ENCOUNTER — Encounter: Payer: Self-pay | Admitting: Internal Medicine

## 2015-02-05 ENCOUNTER — Ambulatory Visit (INDEPENDENT_AMBULATORY_CARE_PROVIDER_SITE_OTHER): Payer: 59 | Admitting: Internal Medicine

## 2015-02-05 VITALS — BP 128/80 | HR 91 | Ht 62.0 in | Wt 127.8 lb

## 2015-02-05 DIAGNOSIS — J449 Chronic obstructive pulmonary disease, unspecified: Secondary | ICD-10-CM | POA: Diagnosis not present

## 2015-02-05 DIAGNOSIS — Z720 Tobacco use: Secondary | ICD-10-CM | POA: Diagnosis not present

## 2015-02-05 MED ORDER — TIOTROPIUM BROMIDE-OLODATEROL 2.5-2.5 MCG/ACT IN AERS: 2.0000 | INHALATION_SPRAY | Freq: Every day | RESPIRATORY_TRACT | Status: DC

## 2015-02-05 MED ORDER — TRAMADOL HCL 50 MG PO TABS: 50.0000 mg | ORAL_TABLET | Freq: Four times a day (QID) | ORAL | Status: DC | PRN

## 2015-02-05 NOTE — Patient Instructions (Signed)
Sample Stiolto Respiclick      2 inhalations once daily    Try this instead of Symbicort plus Spiriva, for comparison  Order- [place] room air     Dx COPD mixed type

## 2015-02-05 NOTE — Progress Notes (Signed)
Patient ID: Carol Quinn, female    DOB: 02/27/1955, 60 y.o.   MRN: [id number]  HPI 69 yoF former smoker followed for COPD and hypersomnia. Last here Sept 19, when she had been sick after a cruise. She did gradually get better. Comes now- to f/u after MSLT done for assessment of her persistent c/o "tired". Today she also says she has been having some sore throat this week w/o fever. Some dry cough. Denies fever or nodes. Lab- MSLT 04/28/10- short at 4.1 minutes, SOREM 0, so nonspecific, c/w primary ideopathic hypersomnia.  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 Still has a sense of fatigue, feeling she can't put one foot in front of another,  and easy confusion. Caffeine has no effect. Thyroid has been checked a lot. Sister has lupus, but she has tested negative.  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 We have again discussed motivation and support for quitting . Acute visit- Called 09/13/10 - On call Dr. gave prednisone taper and Zpak for 4 day of hx cough, green sputum and SOB. 1 day fever. No better so far with this Rx. Using her inhaler more.   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 Continues to smoke against advice. Hydromet cough syrup made her "paranoid"-she cried and screamed. 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 Nuvigil has allowed her to get up and get to work on a regular basis while sleeping soundly at night. If she skips a dose she expects to sleep all day. Her breathing has been stable with Symbicort. She rarely needs a rescue inhaler. She has had no acute respiratory illness  recently.  04/06/2011 Acute OV  Complains of prod cough with brown mucus, increased SOB, wheezing, tightness in chest x1.5week, progessively getting worse.  Has been using over-the-counter cough products, without any relief. Plays increased wheezing, and shortness of breath over last few days. We talked adeptly about smoking cessation. She denies any hemoptysis, chest pain, orthopnea, PND, or leg swelling. >>tx w/ Omnicef/Prednisone taper   04/15/2011 [facility]  Patient returns for a post hospitalization visit. Patient was admitted March 29- 04/10/2011 for a COPD exacerbation. Patient presented with progressively worsening cough, congestion, and wheezing. She was treated with IV antibiotics, steroids, and aggressive nebulizer treatments. X-ray showed no acute changes. Once again, she was recommended to quit smoking. She was discharged on Avelox and a prednisone taper. Since discharge. Patient says she is still feeling weak with no energy.  No smoking x 1 week. Has 2 days left on Avelox and Prednisone .  Discussed smoking cesstation.   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 Avelox helped. Back at work, off of prednisone and antibiotics with no cough. Smoking an electronic cigarette now. We discussed smoking cessation again. 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 Chest tight, racing heart no increased use of bronchodilator. She continues Nuvigil "very helpful" for help in her stay awake at work. Trying to get adequate sleep at night. COPD assessment test  (CAT) score 32/40.  01/16/11 Acute OV  Complains of prod cough with brown/green mucus, hoarseness, increased SOB, wheezing, tightness in chest, back pain, chills x2weeks.  finished levaquin yesterday.  Called in levaquin 500mg  x 7 days on 12/30 for above symptoms Some better but not gone completely , still has thick mucus , green at time.  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 Pt c/o chest congestion (not bad right now), she has good and bad days with her breathing, slight cough w/ cream color phlegm, chest tx at times. Increased cough over the past month and chest tightness and postnasal drip. Rescue inhaler and Symbicort have been sufficient. Nebulizer helps in the winter time. Still smoking against advice. Nuvigil still works well for her.  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 Denies fever, chills, sore throat or GI upset. Last got doxycycline and prednisone in June. Is not trying to stop smoking. We discussed this again. CXR 04/07/12 IMPRESSION:  Emphysema without acute cardiopulmonary disease.  Original Report Authenticated By: Dereck Ligas, M.D.  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 PFT 06/27/08- FEV1/FVC 0.51, DLCO 49% severe COPD a1AT 04/19/10- MM nl CXR 05/17/13- mild hyperV, NAD Mostly dyspnea on exertion but notices some dry cough in the last 3 weeks. Always feels tired. She is aware of diagnosis of emphysema and admits stress. Myocardial perfusion imaging study was normal.  she uses her nebulizer machine mostly with if she has a cold. Nuvigil has been a big help with daytime fatigue.  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 Her primary office gave prednisone for a few days, benzonatate, doxycycline and a neb treatment. She has been out of work all week. PFT-07/18/2014-severe obstructive airways disease with response to bronchodilator. FEV1 1.21/48% (+32%), FEV1/FVC 0.51, TLC 95%, DLCO 35% 6MWT 07/18/14- 96%, 98%, 98%, 312 m. Could only walk 4 minutes-stopped because of shortness of breath and dizziness CXR 05/29/14 IMPRESSION: Emphysema without acute disease. Electronically Signed  By: Inge Rise M.D.  On: 05/30/2014 09:12  02/05/2015-60 year old female smoker followed for COPD Gold III-IV, idiopathic hypersomnia/MSLT 4.1, complicated by history depression FOLLOWS FOR:Pt states her breating has not been good; states other people notice her wheezing-she does not.  Pt also notes that she stays tired alot and takes longer to "move.walk,bending over" more at work. Nuclear stress test WNL 04/14/2014 Respiratory infections twice during the fall but okay now. Smoking associated with stress at work. Chantix was some help in past despite "crazy dreams" and she says she may want to try it again some time. Encouraged to stop smoking.  ROS-see HPI Constitutional:   No-   weight loss, night sweats, fevers, chills, +fatigue, lassitude. HEENT:   No-  headaches, difficulty swallowing, tooth/dental problems, sore throat,       No-  sneezing, itching, ear ache, nasal congestion, +post nasal drip,  CV:  No-   chest pain, orthopnea, PND, swelling in  lower extremities, anasarca, dizziness, palpitations Resp: + shortness of breath with exertion or at rest.              + productive cough,  No non-productive cough,  No- coughing up of blood.               change in color of mucus.  No- wheezing.   Skin: No-   rash or lesions. GI:  No-   heartburn, indigestion, abdominal pain, nausea, vomiting,  GU:  MS:  No-   joint pain or swelling.   Neuro-     nothing unusual Psych:  No- change in mood or affect. No depression or anxiety.  No memory loss.  OBJ- Physical Exam General- Alert, Oriented, Affect-appropriate, Skin- rash-none, lesions- none, excoriation- none Lymphadenopathy- none Head- atraumatic            Eyes- Gross vision intact, PERRLA, conjunctivae and secretions clear            Ears- Hearing, canals-normal            Nose- clear, no-Septal dev, mucus, polyps, erosion, perforation             Throat- Mallampati II , mucosa clear/ not red , drainage- none, tonsils- atrophic Neck- flexible , trachea midline, no stridor , thyroid nl, carotid no bruit Chest - symmetrical excursion , unlabored           Heart/CV- RRR , no murmur , no gallop  , no rub, nl s1 s2                           - JVD- none , edema- none, stasis changes- none, varices- none           Lung-  wheeze -none, cough-none , dullness-none, rub- none           Chest wall-  Abd-  Br/ Gen/ Rectal- Not done, not indicated Extrem- cyanosis- none, clubbing, none, atrophy- none, strength- nl Neuro- grossly intact to observation

## 2015-02-06 NOTE — Assessment & Plan Note (Signed)
Had respiratory viral infection pattern exacerbations during the fall but clear at present time and probably at baseline.

## 2015-02-06 NOTE — Assessment & Plan Note (Signed)
We discussed smoking cessation alternatives and I encouraged her to make an effort. She blames work stress and discussed plans for eventual retirement.

## 2015-02-11 ENCOUNTER — Telehealth: Payer: Self-pay | Admitting: Internal Medicine

## 2015-02-11 DIAGNOSIS — J449 Chronic obstructive pulmonary disease, unspecified: Secondary | ICD-10-CM

## 2015-02-11 NOTE — Telephone Encounter (Signed)
Per CY: Patient does qualify for O2 at night 2Liters Place order for nocturnal O2  Dx: COPD Mixed Type --------- Pt aware of results per CY Results placed in scan folder.  Order placed for 2L nocturnal O2 through [facility] (per pt request). Nothing further needed.

## 2015-02-18 ENCOUNTER — Encounter: Payer: Self-pay | Admitting: Internal Medicine

## 2015-02-27 ENCOUNTER — Other Ambulatory Visit: Payer: Self-pay | Admitting: Family Medicine

## 2015-03-02 NOTE — Telephone Encounter (Signed)
Medication filled to pharmacy as requested.  Pt has a CPE on 03/03/14.

## 2015-03-03 ENCOUNTER — Encounter: Payer: Self-pay | Admitting: Behavioral Health

## 2015-03-03 ENCOUNTER — Telehealth: Payer: Self-pay | Admitting: Behavioral Health

## 2015-03-03 NOTE — Telephone Encounter (Signed)
Pre-Visit Call completed with patient and chart updated.   Pre-Visit Info documented in Specialty Comments under [employer].    

## 2015-03-04 ENCOUNTER — Ambulatory Visit (INDEPENDENT_AMBULATORY_CARE_PROVIDER_SITE_OTHER): Payer: 59 | Admitting: Family Medicine

## 2015-03-04 ENCOUNTER — Encounter: Payer: Self-pay | Admitting: Family Medicine

## 2015-03-04 VITALS — BP 118/80 | HR 93 | Ht 62.0 in | Wt 128.2 lb

## 2015-03-04 DIAGNOSIS — R0602 Shortness of breath: Secondary | ICD-10-CM | POA: Diagnosis not present

## 2015-03-04 DIAGNOSIS — J441 Chronic obstructive pulmonary disease with (acute) exacerbation: Secondary | ICD-10-CM

## 2015-03-04 MED ORDER — IPRATROPIUM-ALBUTEROL 0.5-2.5 (3) MG/3ML IN SOLN
3.0000 mL | Freq: Once | RESPIRATORY_TRACT | Status: AC
  Administered 2015-03-04: 3 mL via RESPIRATORY_TRACT

## 2015-03-04 MED ORDER — DOXYCYCLINE HYCLATE 100 MG PO TABS: 100.0000 mg | ORAL_TABLET | Freq: Two times a day (BID) | ORAL | Status: DC

## 2015-03-04 MED ORDER — PROMETHAZINE-DM 6.25-15 MG/5ML PO SYRP: 5.0000 mL | ORAL_SOLUTION | Freq: Four times a day (QID) | ORAL | Status: DC | PRN

## 2015-03-04 MED ORDER — PREDNISONE 10 MG PO TABS: ORAL_TABLET | ORAL | Status: DC

## 2015-03-04 MED ORDER — METHYLPREDNISOLONE ACETATE 80 MG/ML IJ SUSP: 80.0000 mg | Freq: Once | INTRAMUSCULAR | Status: DC

## 2015-03-04 NOTE — Progress Notes (Signed)
Pre visit review using our clinic review tool, if applicable. No additional management support is needed unless otherwise documented below in the visit note. 

## 2015-03-04 NOTE — Patient Instructions (Signed)
Reschedule your physical at your convenience Start the Prednisone tomorrow as directed- take w/ food Use the promethazine cough syrup as needed- will cause drowsiness Continue to use your nebulizer regularly Start the Doxycycline twice daily for the COPD exacerbation If no improvement, or worsening, please call Dr Annamaria Boots or go to the ER Use your O2 regularly until you are feeling better Call with any questions or concerns Hang in there!!!

## 2015-03-04 NOTE — Progress Notes (Signed)
   Subjective:    Patient ID: Carol Quinn, female    DOB: 27-Oct-1955, 60 y.o.   MRN: [id number]  HPI COPD- pt came in today for her CPE but was having difficulty breathing upon ambulation from waiting room to exam room.  LPN placed her on 2 L of O2 and her sats were 97-98%.  Pt is very anxious- tearful, apologizing to LPN and myself (reason unclear).  Pt was placed on Duoneb in office w/ some relief.  Pt states she is doing her neb txs every 6 hrs w/ temporary relief but will have severe SOB w/ any type of exertion- 'just walking from the living room to the bathroom'.  Pt sees Dr Annamaria Boots and was recently placed on O2 at night.  Pt reports she has been having difficulty for the last 3 days w/ increased cough, SOB but sxs started ~2 weeks ago.  + nasal congestion.  Pt reports Tm 99 on Monday.  Takes Symbicort, Spiriva, Singulair, Allegra.  Pt went to ER last night due to SOB but left due to overcrowding.   Review of Systems For ROS see HPI     Objective:   Physical Exam  Constitutional: She is oriented to person, place, and time. She appears well-developed and well-nourished. She appears distressed.  HENT:  Head: Normocephalic and atraumatic.  Nose: Nose normal.  Mouth/Throat: Oropharynx is clear and moist. No oropharyngeal exudate.  TMs WNL bilaterally No TTP over sinuses  Eyes: Conjunctivae and EOM are normal. Pupils are equal, round, and reactive to light.  Neck: Normal range of motion. Neck supple.  Cardiovascular: Normal rate, regular rhythm and normal heart sounds.   Pulmonary/Chest: She is in respiratory distress (leaning forward, breathing through pursed lips). She has wheezes (diffuse expiratory wheezes).  Decreased BS diffusely- improved after neb tx exposing underlying expiratory wheezes  Lymphadenopathy:    She has no cervical adenopathy.  Neurological: She is alert and oriented to person, place, and time.  Skin: Skin is warm and dry.  Psychiatric:  Very anxious,  panicking Apologizing w/o cause  Vitals reviewed.         Assessment & Plan:

## 2015-03-04 NOTE — Assessment & Plan Note (Signed)
Recurrent problem for pt.  Pt is having both COPD exacerbation and panic attack in office.  O2 sats were 97-98% but due to pt's distress, we put pt on 2L O2 in office.  It wasn't until pt calmed down that she was able to speak in complete sentences and breathe normally.  Will start abx, prednisone- after depo medrol in office, cough meds, and increase pt's O2 to round the clock until feeling better.  Reviewed supportive care and red flags that should prompt return.  Encouraged pt to f/u w/ Pulm.  Pt expressed understanding and is in agreement w/ plan.

## 2015-03-05 ENCOUNTER — Ambulatory Visit: Payer: 59 | Admitting: Adult Health

## 2015-03-05 ENCOUNTER — Ambulatory Visit (INDEPENDENT_AMBULATORY_CARE_PROVIDER_SITE_OTHER): Payer: 59 | Admitting: Internal Medicine

## 2015-03-05 ENCOUNTER — Telehealth: Payer: Self-pay | Admitting: Internal Medicine

## 2015-03-05 ENCOUNTER — Encounter: Payer: Self-pay | Admitting: *Deleted

## 2015-03-05 ENCOUNTER — Ambulatory Visit (INDEPENDENT_AMBULATORY_CARE_PROVIDER_SITE_OTHER)
Admission: RE | Admit: 2015-03-05 | Discharge: 2015-03-05 | Disposition: A | Discharge status: Home / Self Care | Payer: 59 | Source: Ambulatory Visit | Attending: Internal Medicine | Admitting: Internal Medicine

## 2015-03-05 ENCOUNTER — Other Ambulatory Visit (INDEPENDENT_AMBULATORY_CARE_PROVIDER_SITE_OTHER): Payer: 59

## 2015-03-05 ENCOUNTER — Telehealth: Payer: Self-pay | Admitting: Family Medicine

## 2015-03-05 ENCOUNTER — Other Ambulatory Visit: Payer: Self-pay

## 2015-03-05 ENCOUNTER — Encounter: Payer: Self-pay | Admitting: Internal Medicine

## 2015-03-05 VITALS — BP 122/72 | HR 102 | Temp 97.6°F | Ht 62.0 in | Wt 127.4 lb

## 2015-03-05 DIAGNOSIS — R6889 Other general symptoms and signs: Secondary | ICD-10-CM | POA: Diagnosis not present

## 2015-03-05 DIAGNOSIS — J441 Chronic obstructive pulmonary disease with (acute) exacerbation: Secondary | ICD-10-CM

## 2015-03-05 DIAGNOSIS — J9611 Chronic respiratory failure with hypoxia: Secondary | ICD-10-CM

## 2015-03-05 DIAGNOSIS — Z720 Tobacco use: Secondary | ICD-10-CM | POA: Diagnosis not present

## 2015-03-05 LAB — CBC WITH DIFFERENTIAL/PLATELET
BASOS PCT: 0.6 % (ref 0.0–3.0)
Basophils Absolute: 0 10*3/uL (ref 0.0–0.1)
EOS PCT: 0.9 % (ref 0.0–5.0)
Eosinophils Absolute: 0 10*3/uL (ref 0.0–0.7)
HEMATOCRIT: 44.3 % (ref 36.0–46.0)
HEMOGLOBIN: 15 g/dL (ref 12.0–15.0)
LYMPHS PCT: 22.9 % (ref 12.0–46.0)
Lymphs Abs: 1.3 10*3/uL (ref 0.7–4.0)
MCHC: 33.9 g/dL (ref 30.0–36.0)
MCV: 95.7 fl (ref 78.0–100.0)
MONO ABS: 0.5 10*3/uL (ref 0.1–1.0)
Monocytes Relative: 8.6 % (ref 3.0–12.0)
NEUTROS ABS: 3.7 10*3/uL (ref 1.4–7.7)
Neutrophils Relative %: 67 % (ref 43.0–77.0)
PLATELETS: 294 10*3/uL (ref 150.0–400.0)
RBC: 4.63 Mil/uL (ref 3.87–5.11)
RDW: 13.1 % (ref 11.5–15.5)
WBC: 5.6 10*3/uL (ref 4.0–10.5)

## 2015-03-05 LAB — BASIC METABOLIC PANEL
BUN: 14 mg/dL (ref 6–23)
CALCIUM: 9.6 mg/dL (ref 8.4–10.5)
CO2: 31 mEq/L (ref 19–32)
Chloride: 103 mEq/L (ref 96–112)
Creatinine, Ser: 0.51 mg/dL (ref 0.40–1.20)
GFR: 131.56 mL/min (ref >60.00)
GLUCOSE: 106 mg/dL — AB (ref 70–99)
Potassium: 4 mEq/L (ref 3.5–5.1)
Sodium: 141 mEq/L (ref 135–145)

## 2015-03-05 LAB — POCT INFLUENZA A/B
Influenza A, POC: NEGATIVE
Influenza B, POC: NEGATIVE

## 2015-03-05 MED ORDER — IPRATROPIUM-ALBUTEROL 0.5-2.5 (3) MG/3ML IN SOLN: 3.0000 mL | Freq: Four times a day (QID) | RESPIRATORY_TRACT | Status: DC | PRN

## 2015-03-05 MED ORDER — OSELTAMIVIR PHOSPHATE 75 MG PO CAPS: 75.0000 mg | ORAL_CAPSULE | Freq: Two times a day (BID) | ORAL | Status: DC

## 2015-03-05 NOTE — Telephone Encounter (Signed)
Caller name:Carol Quinn Relationship to patient:self Can be reached:[phone] Pharmacy:[facility]  Reason for call:requesting refill on duoneb, request call once sent

## 2015-03-05 NOTE — Progress Notes (Signed)
Patient ID: Carol Quinn, female    DOB: 02/27/1955, 60 y.o.   MRN: [id number]  HPI 69 yoF former smoker followed for COPD and hypersomnia. Last here Sept 19, when she had been sick after a cruise. She did gradually get better. Comes now- to f/u after MSLT done for assessment of her persistent c/o "tired". Today she also says she has been having some sore throat this week w/o fever. Some dry cough. Denies fever or nodes. Lab- MSLT 04/28/10- short at 4.1 minutes, SOREM 0, so nonspecific, c/w primary ideopathic hypersomnia.  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 Still has a sense of fatigue, feeling she can't put one foot in front of another,  and easy confusion. Caffeine has no effect. Thyroid has been checked a lot. Sister has lupus, but she has tested negative.  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 We have again discussed motivation and support for quitting . Acute visit- Called 09/13/10 - On call Dr. gave prednisone taper and Zpak for 4 day of hx cough, green sputum and SOB. 1 day fever. No better so far with this Rx. Using her inhaler more.   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 Continues to smoke against advice. Hydromet cough syrup made her "paranoid"-she cried and screamed. 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 Nuvigil has allowed her to get up and get to work on a regular basis while sleeping soundly at night. If she skips a dose she expects to sleep all day. Her breathing has been stable with Symbicort. She rarely needs a rescue inhaler. She has had no acute respiratory illness  recently.  04/06/2011 Acute OV  Complains of prod cough with brown mucus, increased SOB, wheezing, tightness in chest x1.5week, progessively getting worse.  Has been using over-the-counter cough products, without any relief. Plays increased wheezing, and shortness of breath over last few days. We talked adeptly about smoking cessation. She denies any hemoptysis, chest pain, orthopnea, PND, or leg swelling. >>tx w/ Omnicef/Prednisone taper   04/15/2011 [facility]  Patient returns for a post hospitalization visit. Patient was admitted March 29- 04/10/2011 for a COPD exacerbation. Patient presented with progressively worsening cough, congestion, and wheezing. She was treated with IV antibiotics, steroids, and aggressive nebulizer treatments. X-ray showed no acute changes. Once again, she was recommended to quit smoking. She was discharged on Avelox and a prednisone taper. Since discharge. Patient says she is still feeling weak with no energy.  No smoking x 1 week. Has 2 days left on Avelox and Prednisone .  Discussed smoking cesstation.   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 Avelox helped. Back at work, off of prednisone and antibiotics with no cough. Smoking an electronic cigarette now. We discussed smoking cessation again. 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 Chest tight, racing heart no increased use of bronchodilator. She continues Nuvigil "very helpful" for help in her stay awake at work. Trying to get adequate sleep at night. COPD assessment test  (CAT) score 32/40.  01/16/11 Acute OV  Complains of prod cough with brown/green mucus, hoarseness, increased SOB, wheezing, tightness in chest, back pain, chills x2weeks.  finished levaquin yesterday.  Called in levaquin 500mg  x 7 days on 12/30 for above symptoms Some better but not gone completely , still has thick mucus , green at time.  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 Pt c/o chest congestion (not bad right now), she has good and bad days with her breathing, slight cough w/ cream color phlegm, chest tx at times. Increased cough over the past month and chest tightness and postnasal drip. Rescue inhaler and Symbicort have been sufficient. Nebulizer helps in the winter time. Still smoking against advice. Nuvigil still works well for her.  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 Denies fever, chills, sore throat or GI upset. Last got doxycycline and prednisone in June. Is not trying to stop smoking. We discussed this again. CXR 04/07/12 IMPRESSION:  Emphysema without acute cardiopulmonary disease.  Original Report Authenticated By: Dereck Ligas, M.D.  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 PFT 06/27/08- FEV1/FVC 0.51, DLCO 49% severe COPD a1AT 04/19/10- MM nl CXR 05/17/13- mild hyperV, NAD Mostly dyspnea on exertion but notices some dry cough in the last 3 weeks. Always feels tired. She is aware of diagnosis of emphysema and admits stress. Myocardial perfusion imaging study was normal.  she uses her nebulizer machine mostly with if she has a cold. Nuvigil has been a big help with daytime fatigue.  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 Her primary office gave prednisone for a few days, benzonatate, doxycycline and a neb treatment. She has been out of work all week. PFT-07/18/2014-severe obstructive airways disease with response to bronchodilator. FEV1 1.21/48% (+32%), FEV1/FVC 0.51, TLC 95%, DLCO 35% 6MWT 07/18/14- 96%, 98%, 98%, 312 m. Could only walk 4 minutes-stopped because of shortness of breath and dizziness CXR 05/29/14 IMPRESSION: Emphysema without acute disease. Electronically Signed  By: Inge Rise M.D.  On: 05/30/2014 09:12  02/05/2015-60 year old female smoker followed for COPD Gold III-IV, idiopathic hypersomnia/MSLT 4.1, complicated by history depression FOLLOWS FOR:Pt states her breating has not been good; states other people notice her wheezing-she does not.  Pt also notes that she stays tired alot and takes longer to "move.walk,bending over" more at work. Nuclear stress test WNL 04/14/2014 Respiratory infections twice during the fall but okay now. Smoking associated with stress at work. Chantix was some help in past despite "crazy dreams" and she says she may want to try it again some time. Encouraged to stop smoking.  03/05/2015-60 year old female smoker followed for COPD Gold III-IV, idiopathic hypersomnia/MSLT 4 .1, complicated by depression O2 2 L/sleep/[facility]  Started 02/11/15 based on overnight oximetry ACUTE VISIT: Pt is having hard time breathing; Increased SOB and tight in chest. Seen PCP yesterday and was given  Doxycycline. Worse in the morning; husband states its hard to understand patient when on phone each day due to breathing so bad-getting worse in past several months. Has not been smoking this week due to not feeling well. Loose stools since yesterday as well and drinking lots of water-which is unusal for her. Here with husband Began with bad sore throat, increased SOB esp w exertion. Doxy started yesterday> loose stools started a day before. No N/V Cough remains dry.  Nasal Flu Swab-negative today Walk Test Room Air 03/05/2015-desaturated to 88%  ROS-see HPI Constitutional:   No-   weight loss, night sweats, fevers, chills, +fatigue, lassitude. HEENT:   No-  headaches, difficulty swallowing, tooth/dental problems, sore throat,       No-  sneezing, itching, ear ache, nasal congestion, +post nasal drip,  CV:  No-   chest pain, orthopnea, PND, swelling in lower extremities, anasarca, dizziness, palpitations Resp: + shortness of breath with exertion or at rest.               productive cough,  + non-productive cough,  No- coughing up of blood.               change in color of mucus.  No- wheezing.   Skin: No-   rash or lesions. GI:  No-   heartburn, indigestion, abdominal pain, nausea, vomiting,  GU:  MS:  No-   joint pain or swelling.   Neuro-     nothing unusual Psych:  No- change in mood or affect. No depression or anxiety.  No memory loss.  OBJ- Physical Exam General- Alert, Oriented, Affect-appropriate,  Temp 97.6/P 102 Skin- rash-none, lesions- none, excoriation- none Lymphadenopathy- none Head- atraumatic            Eyes- Gross vision intact, PERRLA, conjunctivae and secretions clear            Ears- Hearing, canals-normal            Nose- clear, no-Septal dev, mucus, polyps, erosion, perforation             Throat- Mallampati II , mucosa clear/ not red , drainage- none, tonsils- atrophic Neck- flexible , trachea midline, no stridor , thyroid nl, carotid no bruit Chest - symmetrical  excursion , unlabored           Heart/CV- RRR , no murmur , no gallop  , no rub, nl s1 s2                           - JVD- none , edema- none, stasis changes- none, varices- none           Lung-  wheeze -none, cough + light , dullness-none, rub- none, + faint rhonchi LUL           Chest wall-  Abd-  Br/ Gen/ Rectal- Not done, not indicated Extrem- cyanosis- none, clubbing, none, atrophy- none, strength- nl Neuro- grossly intact to observation

## 2015-03-05 NOTE — Telephone Encounter (Signed)
Still very easy DOE. CXR- no pneumonia. Rec- fluids, rest, O2 and meds as prescribed

## 2015-03-05 NOTE — Telephone Encounter (Signed)
Left message on patients answering machine medication refilled.

## 2015-03-05 NOTE — Patient Instructions (Addendum)
Script sent for Tamiflu  Order- nasal flu swab    Dx Acute exacerbation COPD  Order- CXR  Order- lab CBC w diff, BMET  Order- room air walk test  Order- DME APS  Change O2 to continuous and portable  2L-3L/M, eval for Portable concentrator  Change doxycycline to one daily x 7 days, then stop  Ok to take otc Immodium if needed for loose stools  Stay well hydrated and rest  Ok to set your oxygen 2-3 L/M  Leave of absence note Monday Feb 20 to return Weds March 1

## 2015-03-05 NOTE — Assessment & Plan Note (Signed)
Desaturated with walk test so we are adding portable oxygen, recommending portable concentrator

## 2015-03-05 NOTE — Assessment & Plan Note (Signed)
She has not smoked in a week because she has been acutely ill. Building on this experience with recommendation that she stop now and completely.

## 2015-03-05 NOTE — Telephone Encounter (Signed)
Pt saw PCP yesterday: Patient Instructions     Reschedule your physical at your convenience Start the Prednisone tomorrow as directed- take w/ food Use the promethazine cough syrup as needed- will cause drowsiness Continue to use your nebulizer regularly Start the Doxycycline twice daily for the COPD exacerbation If no improvement, or worsening, please call Dr Annamaria Boots or go to the ER Use your O2 regularly until you are feeling better Call with any questions or concerns Hang in there!!!  --  Called spoke with pt. She reports she is not feeling any better after seeing PCP yesterday. She was given neb TX and depo in office. Per Dr. Annamaria Boots, work in on his scheduled this afternoon. Pt is scheduled at 2pm. Nothing further needed

## 2015-03-05 NOTE — Telephone Encounter (Signed)
[phone] calling back

## 2015-03-05 NOTE — Telephone Encounter (Signed)
Spoke with the pt  She is asking about POC that we ordered today  APS is supposed to be coming out tomorrow  I advised that we ordered POC eval, which means they will check her sats on POC to be sure they are good enough  She verbalized understanding and will call with further questions/concerns

## 2015-03-05 NOTE — Assessment & Plan Note (Signed)
Flu swab negative but this is an intense viral respiratory illness of some sort. Doxycycline may be aggravating loose stools. Plan-reduced doxycycline 100 mg daily, add Imodium if needed. CXR, CBC with differential, Tamiflu

## 2015-03-05 NOTE — Telephone Encounter (Signed)
LMOMTCB x 1 

## 2015-03-23 ENCOUNTER — Encounter: Payer: Self-pay | Admitting: Internal Medicine

## 2015-03-23 ENCOUNTER — Ambulatory Visit (INDEPENDENT_AMBULATORY_CARE_PROVIDER_SITE_OTHER): Payer: 59 | Admitting: Internal Medicine

## 2015-03-23 ENCOUNTER — Telehealth: Payer: Self-pay | Admitting: Internal Medicine

## 2015-03-23 VITALS — BP 126/72 | HR 83 | Ht 62.0 in | Wt 133.4 lb

## 2015-03-23 DIAGNOSIS — J441 Chronic obstructive pulmonary disease with (acute) exacerbation: Secondary | ICD-10-CM | POA: Diagnosis not present

## 2015-03-23 DIAGNOSIS — J449 Chronic obstructive pulmonary disease, unspecified: Secondary | ICD-10-CM

## 2015-03-23 DIAGNOSIS — Z720 Tobacco use: Secondary | ICD-10-CM

## 2015-03-23 MED ORDER — VARENICLINE TARTRATE 0.5 MG X 11 & 1 MG X 42 PO MISC: ORAL | Status: DC

## 2015-03-23 NOTE — Telephone Encounter (Signed)
Letter created, signed and faxed to pt at her employer's office so she can  Called patient back to confirm receipt of fax. She has not received the fax yet, she will call me back to let me know when she receives the fax. Awaiting call back to confirm receipt of letter.

## 2015-03-23 NOTE — Progress Notes (Signed)
Patient ID: Carol Quinn, female    DOB: 02/27/1955, 60 y.o.   MRN: [id number]  HPI 69 yoF former smoker followed for COPD and hypersomnia. Last here Sept 19, when she had been sick after a cruise. She did gradually get better. Comes now- to f/u after MSLT done for assessment of her persistent c/o "tired". Today she also says she has been having some sore throat this week w/o fever. Some dry cough. Denies fever or nodes. Lab- MSLT 04/28/10- short at 4.1 minutes, SOREM 0, so nonspecific, c/w primary ideopathic hypersomnia.  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 Still has a sense of fatigue, feeling she can't put one foot in front of another,  and easy confusion. Caffeine has no effect. Thyroid has been checked a lot. Sister has lupus, but she has tested negative.  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 We have again discussed motivation and support for quitting . Acute visit- Called 09/13/10 - On call Dr. gave prednisone taper and Zpak for 4 day of hx cough, green sputum and SOB. 1 day fever. No better so far with this Rx. Using her inhaler more.   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 Continues to smoke against advice. Hydromet cough syrup made her "paranoid"-she cried and screamed. 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 Nuvigil has allowed her to get up and get to work on a regular basis while sleeping soundly at night. If she skips a dose she expects to sleep all day. Her breathing has been stable with Symbicort. She rarely needs a rescue inhaler. She has had no acute respiratory illness  recently.  04/06/2011 Acute OV  Complains of prod cough with brown mucus, increased SOB, wheezing, tightness in chest x1.5week, progessively getting worse.  Has been using over-the-counter cough products, without any relief. Plays increased wheezing, and shortness of breath over last few days. We talked adeptly about smoking cessation. She denies any hemoptysis, chest pain, orthopnea, PND, or leg swelling. >>tx w/ Omnicef/Prednisone taper   04/15/2011 [facility]  Patient returns for a post hospitalization visit. Patient was admitted March 29- 04/10/2011 for a COPD exacerbation. Patient presented with progressively worsening cough, congestion, and wheezing. She was treated with IV antibiotics, steroids, and aggressive nebulizer treatments. X-ray showed no acute changes. Once again, she was recommended to quit smoking. She was discharged on Avelox and a prednisone taper. Since discharge. Patient says she is still feeling weak with no energy.  No smoking x 1 week. Has 2 days left on Avelox and Prednisone .  Discussed smoking cesstation.   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 Avelox helped. Back at work, off of prednisone and antibiotics with no cough. Smoking an electronic cigarette now. We discussed smoking cessation again. 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 Chest tight, racing heart no increased use of bronchodilator. She continues Nuvigil "very helpful" for help in her stay awake at work. Trying to get adequate sleep at night. COPD assessment test  (CAT) score 32/40.  01/16/11 Acute OV  Complains of prod cough with brown/green mucus, hoarseness, increased SOB, wheezing, tightness in chest, back pain, chills x2weeks.  finished levaquin yesterday.  Called in levaquin 500mg  x 7 days on 12/30 for above symptoms Some better but not gone completely , still has thick mucus , green at time.  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 Pt c/o chest congestion (not bad right now), she has good and bad days with her breathing, slight cough w/ cream color phlegm, chest tx at times. Increased cough over the past month and chest tightness and postnasal drip. Rescue inhaler and Symbicort have been sufficient. Nebulizer helps in the winter time. Still smoking against advice. Nuvigil still works well for her.  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 Denies fever, chills, sore throat or GI upset. Last got doxycycline and prednisone in June. Is not trying to stop smoking. We discussed this again. CXR 04/07/12 IMPRESSION:  Emphysema without acute cardiopulmonary disease.  Original Report Authenticated By: Dereck Ligas, M.D.  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 PFT 06/27/08- FEV1/FVC 0.51, DLCO 49% severe COPD a1AT 04/19/10- MM nl CXR 05/17/13- mild hyperV, NAD Mostly dyspnea on exertion but notices some dry cough in the last 3 weeks. Always feels tired. She is aware of diagnosis of emphysema and admits stress. Myocardial perfusion imaging study was normal.  she uses her nebulizer machine mostly with if she has a cold. Nuvigil has been a big help with daytime fatigue.  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 Her primary office gave prednisone for a few days, benzonatate, doxycycline and a neb treatment. She has been out of work all week. PFT-07/18/2014-severe obstructive airways disease with response to bronchodilator. FEV1 1.21/48% (+32%), FEV1/FVC 0.51, TLC 95%, DLCO 35% 6MWT 07/18/14- 96%, 98%, 98%, 312 m. Could only walk 4 minutes-stopped because of shortness of breath and dizziness CXR 05/29/14 IMPRESSION: Emphysema without acute disease. Electronically Signed  By: Inge Rise M.D.  On: 05/30/2014 09:12  02/05/2015-60 year old female smoker followed for COPD Gold III-IV, idiopathic hypersomnia/MSLT 4.1, complicated by history depression FOLLOWS FOR:Pt states her breating has not been good; states other people notice her wheezing-she does not.  Pt also notes that she stays tired alot and takes longer to "move.walk,bending over" more at work. Nuclear stress test WNL 04/14/2014 Respiratory infections twice during the fall but okay now. Smoking associated with stress at work. Chantix was some help in past despite "crazy dreams" and she says she may want to try it again some time. Encouraged to stop smoking.  03/05/2015-60 year old female smoker followed for COPD Gold III-IV, idiopathic hypersomnia/MSLT 4 .1, complicated by depression O2 2 L/sleep/APS  Started 02/11/15 based on overnight oximetry ACUTE VISIT: Pt is having hard time breathing; Increased SOB and tight in chest. Seen PCP yesterday and was given  Doxycycline. Worse in the morning; husband states its hard to understand patient when on phone each day due to breathing so bad-getting worse in past several months. Has not been smoking this week due to not feeling well. Loose stools since yesterday as well and drinking lots of water-which is unusal for her. Here with husband Began with bad sore throat, increased SOB esp w exertion. Doxy started yesterday> loose stools started a day before. No N/V Cough remains dry.  Nasal Flu Swab-negative today Walk Test Room Air 03/05/2015-desaturated to 88%  03/23/2015-60 year old female smoker followed for COPD GOLD III-IV, idiopathic hypersomnia/MSL T4 0.1, complicated by depression O2 2 L sleep and exertion/APS CXR 03/05/2015-I reviewed x-ray images with her IMPRESSION: No active lung disease. No change in hyper aeration Electronically Signed  By: Ivar Drape M.D.  On: 03/05/2015 16:48 FOLLOWS FOR: Pt states she is not feeling any better and would like to discuss FMLA and disability.  Pt continues to wheeze. She is trying to stop smoking and has about smoke in the past week. Asks for Chantix which helped before. She has had more frequent exacerbations of her COPD in the past year and has been acutely ill, not feeling that she can work, about every 3 months. She has home- oxygen and handicapped parking. She has discussed disability with her employer and feels that she cannot go on like this without changes.  ROS-see HPI Constitutional:   No-   weight loss, night sweats, fevers, chills, +fatigue, lassitude. HEENT:   No-  headaches, difficulty swallowing, tooth/dental problems, sore throat,       No-  sneezing, itching, ear ache, nasal congestion, +post nasal drip,  CV:  No-   chest pain, orthopnea, PND, swelling in lower extremities, anasarca, dizziness, palpitations Resp: + shortness of breath with exertion or at rest.               productive cough,  + non-productive cough,  No- coughing up of  blood.               change in color of mucus.  + wheezing.   Skin: No-   rash or lesions. GI:  No-   heartburn, indigestion, abdominal pain, nausea, vomiting,  GU:  MS:  No-   joint pain or swelling.   Neuro-     nothing unusual Psych:  No- change in mood or affect. No depression or anxiety.  No memory loss.  OBJ- Physical Exam General- Alert, Oriented, Affect-appropriate,   Skin- rash-none, lesions- none, excoriation- none Lymphadenopathy- none Head- atraumatic            Eyes- Gross vision intact, PERRLA, conjunctivae and secretions clear            Ears- Hearing, canals-normal            Nose- clear, no-Septal dev, mucus, polyps, erosion, perforation             Throat- Mallampati II , mucosa clear/ not red , drainage- none, tonsils- atrophic Neck- flexible , trachea midline, no stridor , thyroid nl, carotid no bruit Chest - symmetrical excursion , unlabored           Heart/CV- RRR , no murmur , no gallop  ,  no rub, nl s1 s2                           - JVD- none , edema- none, stasis changes- none, varices- none           Lung-  wheeze + with expiration, cough + light , dullness-none, rub- none, + faint rhonchi scattered,                               + distant breath sounds           Chest wall-  Abd-  Br/ Gen/ Rectal- Not done, not indicated Extrem- cyanosis- none, clubbing, none, atrophy- none, strength- nl Neuro- grossly intact to observation

## 2015-03-23 NOTE — Assessment & Plan Note (Addendum)
Discussed smoking cessation , supporting her desire to stop. She says she has not smoked in a week. Plan-Chantix

## 2015-03-23 NOTE — Assessment & Plan Note (Signed)
Normal disease progression especially in context of ongoing smoking. Effort being directed at smoking cessation. She is discussing the disability status, at least short-term, with her employer. If she goes out of work then consider Pulmonary Rehabilitation.

## 2015-03-23 NOTE — Telephone Encounter (Signed)
Please send her a note to whom it may concern- Based on discussion at office visit today, I advise she be out of work for short term disability due to her lung disease from today 03/23/15 until 06/22/15.

## 2015-03-23 NOTE — Assessment & Plan Note (Signed)
Slow return to baseline after recent viral pattern acute exacerbation with bronchitis. No pneumonia on chest x-ray. She has diminished breath sounds and expiratory wheeze. We reviewed chest x-ray images together. I think she will get to feeling better than she does right now, but is reaching a point where at least 3 months short-term disability would help her. Hopefully this won't become long-term/permanent total disability, but it very well may. The expected natural course of her disease is gradual deterioration. Plan- discussed medications and smoking cessation.FMLA form. Support her application for short-term disability up to 3 months from this visit.

## 2015-03-23 NOTE — Patient Instructions (Addendum)
Based on our discussion, I am recommending at least short term disability for 3 months. We may need to change that to permanent/ long term disability  Please stay off the cigarettes!     Script for Chantix printed  We will return FMLA papers to you and you can give them to your employer/ HR

## 2015-03-23 NOTE — Telephone Encounter (Signed)
Pt returning call and states that she only wants to speak katie about this.Carol Quinn

## 2015-03-23 NOTE — Telephone Encounter (Signed)
Patient states that she spoke with Dr. Annamaria Boots this morning and he recommended that patient goes out of work on Sun.  Patient spoke with her HR dept and they told her that they could not take her out of work until Dr. Annamaria Boots takes her out.  Patient was out of work for 8 days, patient has been back at work for 1 week and 2 days now. Patient is at work right now and having trouble catching her breath while at work.  Patient wants to know what she should do, she cannot leave work without being taken out of work by provider.  Dr. Annamaria Boots, please advise.

## 2015-03-23 NOTE — Telephone Encounter (Signed)
Patient states that she received letter and gave to her HR Dept.  HR Dept states that they sent FMLA paperwork to be completed.  Per Joellen Jersey, she has FMLA papers.  Patient wanted to let Joellen Jersey know that her HR Dept told her that she needs to have the exact dates that are on the letter in the [facility] paperwork.   FYI to [employer]

## 2015-03-23 NOTE — Telephone Encounter (Signed)
LMTCB

## 2015-03-24 NOTE — Telephone Encounter (Signed)
lmtcb x2 for pt. 

## 2015-03-25 NOTE — Telephone Encounter (Signed)
Called and spoke with pt. Informed her that forms are completed and ready for pick up. She states she will drop by tomorrow for the forms. She voiced understanding and had no further questions. Nothing further needed.

## 2015-03-25 NOTE — Telephone Encounter (Signed)
CY has completed FMLA paperwork for patient. Placed at front for patient to pick up. Thanks.

## 2015-03-25 NOTE — Telephone Encounter (Signed)
Spoke with the pt  She states wants to be sure that [employer] is aware that she is going to be out of work until June 2017  She states that [facility] forms need to have the specific dates, including her need to return in June  I will forward to [place] to make them aware  Nothing further needed per pt

## 2015-03-26 ENCOUNTER — Telehealth: Payer: Self-pay | Admitting: Internal Medicine

## 2015-03-26 NOTE — Telephone Encounter (Signed)
Called spoke with pt. She reports she came over to the office today and picked up her FMLA forms and signed a medical release. Reports this was then faxed by our front desk over to med recs. This is not scanned in pt chart yet. She reports her disability company advised her they faxed over a form to our office directly yesterday requesting information and is wanting to see if this was received. Please advise Joellen Jersey thanks

## 2015-03-31 NOTE — Telephone Encounter (Signed)
CY has forms on his cart to complete.  

## 2015-03-31 NOTE — Telephone Encounter (Signed)
Carol Quinn, have you received disability forms on this patient? Please advise.

## 2015-04-02 NOTE — Telephone Encounter (Signed)
Pt calling to check on status of forms.Carol Quinn

## 2015-04-02 NOTE — Telephone Encounter (Signed)
Spoke with pt. She is aware that her disability paperwork has been completed. I have given her the number to medical records to check with them about this matter. Nothing further was needed.

## 2015-04-02 NOTE — Telephone Encounter (Signed)
done

## 2015-04-03 ENCOUNTER — Telehealth: Payer: Self-pay | Admitting: Internal Medicine

## 2015-04-03 NOTE — Telephone Encounter (Signed)
CY had completed forms and I have faxed the forms back to [employer]. Copy made for our records and original's mailed to patient to have on file. Nothing more needed at this time.

## 2015-04-03 NOTE — Telephone Encounter (Signed)
Review of care of those [employer] forms today they came in and they should have gone back. Has she been told they were not received by the [facility]?

## 2015-04-03 NOTE — Telephone Encounter (Signed)
Pt states that she was told by our medical records dept that her disability forms were not received  She states the FMLA forms were received, but not the "[place] forms" She is requesting that we please handle this ASAP  Carol Quinn, have you seen the [facility] forms?

## 2015-04-03 NOTE — Telephone Encounter (Signed)
Forms are on CY's cart.

## 2015-04-09 ENCOUNTER — Ambulatory Visit: Payer: 59 | Admitting: Internal Medicine

## 2015-04-13 ENCOUNTER — Telehealth: Payer: Self-pay | Admitting: Internal Medicine

## 2015-04-13 NOTE — Telephone Encounter (Signed)
Spoke with Carol Quinn, states she needs a rov with Carol Quinn by 4/24 d/t her disability status.  Carol Quinn's next available OV is in June.    Carol Quinn/Carol Quinn please advise if Carol Quinn can be worked in by that time.  Thanks!   Last ov: 03/23/15 Next ov: 07/23/15  Allergies  Allergen Reactions  . Biaxin [Clarithromycin]     Rash   . Codeine   . Hydrocodone-Homatropine     paranoia  . Penicillins   . Topiramate     Other reaction(s): WEIGHT LOSS   Current Outpatient Prescriptions on File Prior to Visit  Medication Sig Dispense Refill  . albuterol (PROVENTIL HFA;VENTOLIN HFA) 108 (90 BASE) MCG/ACT inhaler Inhale 2 puffs into the lungs every 4 (four) hours as needed for wheezing. 1 Inhaler prn  . budesonide-formoterol (SYMBICORT) 160-4.5 MCG/ACT inhaler Inhale 2 puffs into the lungs 2 (two) times daily. 1 Inhaler 11  . buPROPion (WELLBUTRIN XL) 150 MG 24 hr tablet TAKE 1 TABLET BY MOUTH ONCE DAILY. 30 tablet 1  . doxycycline (VIBRA-TABS) 100 MG tablet Take 1 tablet (100 mg total) by mouth 2 (two) times daily. 20 tablet 0  . escitalopram (LEXAPRO) 20 MG tablet TAKE 1 TABLET BY MOUTH DAILY. 30 tablet 3  . estradiol (VIVELLE-DOT) 0.1 MG/24HR patch APPLY 1 PATCH TO SKIN TWICE WEEKLY AS DIRECTED    . fexofenadine (ALLEGRA) 180 MG tablet TAKE 1 TABLET BY MOUTH DAILY 30 tablet 3  . ipratropium-albuterol (DUONEB) 0.5-2.5 (3) MG/3ML SOLN Take 3 mLs by nebulization every 6 (six) hours as needed. 360 mL 5  . losartan-hydrochlorothiazide (HYZAAR) 100-25 MG tablet TAKE 1 TABLET BY MOUTH ONCE DAILY. 30 tablet 6  . modafinil (PROVIGIL) 200 MG tablet TAKE 1 TABLET BY MOUTH EVERY DAY 30 tablet 5  . montelukast (SINGULAIR) 10 MG tablet Take 1 tablet (10 mg total) by mouth at bedtime. 30 tablet prn  . omeprazole (PRILOSEC) 40 MG capsule TAKE 1 CAPSULE BY MOUTH DAILY 30 capsule 1  . potassium chloride SA (K-DUR,KLOR-CON) 20 MEQ tablet TAKE 1 TABLET BY MOUTH DAILY. 30 tablet 6  . SPIRIVA HANDIHALER 18 MCG inhalation capsule INHALE THE CONTENTS  OF 1 CAPSULE ONCE DAILY AS DIRECTED. 30 capsule 5  . SUMAtriptan (IMITREX) 100 MG tablet Reported on 03/03/2015    . Tiotropium Bromide-Olodaterol (STIOLTO RESPIMAT) 2.5-2.5 MCG/ACT AERS Inhale 2 puffs into the lungs daily. 1 Inhaler 0  . traMADol (ULTRAM) 50 MG tablet Take 1 tablet (50 mg total) by mouth every 6 (six) hours as needed. 40 tablet 0  . varenicline (CHANTIX PAK) 0.5 MG X 11 & 1 MG X 42 tablet Take one 0.5 mg tablet by mouth once daily for 3 days, then increase to one 0.5 mg tablet twice daily for 4 days, then increase to one 1 mg tablet twice daily. 53 tablet 1  . zolmitriptan (ZOMIG) 5 MG nasal solution Take as directed as needed for migraines      Current Facility-Administered Medications on File Prior to Visit  Medication Dose Route Frequency Provider Last Rate Last Dose  . methylPREDNISolone acetate (DEPO-MEDROL) injection 80 mg  80 mg Intramuscular Once Midge Minium, MD

## 2015-04-14 NOTE — Telephone Encounter (Signed)
[name]- please see what you can work out for her

## 2015-04-16 NOTE — Telephone Encounter (Signed)
Pt can be seen on Monday 04-20-15 at 9:00am slot. Thanks.

## 2015-04-16 NOTE — Telephone Encounter (Signed)
KW please advise.  Thanks  

## 2015-04-16 NOTE — Telephone Encounter (Signed)
Spoke with pt and offered appt per KW. Pt agreed, appt scheduled. Nothing further needed.

## 2015-04-20 ENCOUNTER — Encounter: Payer: Self-pay | Admitting: Internal Medicine

## 2015-04-20 ENCOUNTER — Ambulatory Visit (INDEPENDENT_AMBULATORY_CARE_PROVIDER_SITE_OTHER): Payer: 59 | Admitting: Internal Medicine

## 2015-04-20 VITALS — BP 122/72 | HR 80 | Ht 62.0 in | Wt 127.2 lb

## 2015-04-20 DIAGNOSIS — J449 Chronic obstructive pulmonary disease, unspecified: Secondary | ICD-10-CM

## 2015-04-20 DIAGNOSIS — Z720 Tobacco use: Secondary | ICD-10-CM

## 2015-04-20 DIAGNOSIS — J9611 Chronic respiratory failure with hypoxia: Secondary | ICD-10-CM

## 2015-04-20 MED ORDER — ALBUTEROL SULFATE HFA 108 (90 BASE) MCG/ACT IN AERS: 2.0000 | INHALATION_SPRAY | RESPIRATORY_TRACT | Status: DC | PRN

## 2015-04-20 MED ORDER — TRAMADOL HCL 50 MG PO TABS: 50.0000 mg | ORAL_TABLET | Freq: Four times a day (QID) | ORAL | Status: DC | PRN

## 2015-04-20 MED ORDER — CEFDINIR 300 MG PO CAPS: 300.0000 mg | ORAL_CAPSULE | Freq: Two times a day (BID) | ORAL | Status: DC

## 2015-04-20 NOTE — Patient Instructions (Signed)
Script printed for tramadol and sent for cefdinir antibiotic and for albuterol rescue inhaler  Please call as needed

## 2015-04-20 NOTE — Progress Notes (Signed)
Patient ID: Carol Quinn, female    DOB: 02/27/1955, 60 y.o.   MRN: [id number]  HPI 69 yoF former smoker followed for COPD and hypersomnia. Last here Sept 19, when she had been sick after a cruise. She did gradually get better. Comes now- to f/u after MSLT done for assessment of her persistent c/o "tired". Today she also says she has been having some sore throat this week w/o fever. Some dry cough. Denies fever or nodes. Lab- MSLT 04/28/10- short at 4.1 minutes, SOREM 0, so nonspecific, c/w primary ideopathic hypersomnia.  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 Still has a sense of fatigue, feeling she can't put one foot in front of another,  and easy confusion. Caffeine has no effect. Thyroid has been checked a lot. Sister has lupus, but she has tested negative.  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 We have again discussed motivation and support for quitting . Acute visit- Called 09/13/10 - On call Dr. gave prednisone taper and Zpak for 4 day of hx cough, green sputum and SOB. 1 day fever. No better so far with this Rx. Using her inhaler more.   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 Continues to smoke against advice. Hydromet cough syrup made her "paranoid"-she cried and screamed. 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 Nuvigil has allowed her to get up and get to work on a regular basis while sleeping soundly at night. If she skips a dose she expects to sleep all day. Her breathing has been stable with Symbicort. She rarely needs a rescue inhaler. She has had no acute respiratory illness  recently.  04/06/2011 Acute OV  Complains of prod cough with brown mucus, increased SOB, wheezing, tightness in chest x1.5week, progessively getting worse.  Has been using over-the-counter cough products, without any relief. Plays increased wheezing, and shortness of breath over last few days. We talked adeptly about smoking cessation. She denies any hemoptysis, chest pain, orthopnea, PND, or leg swelling. >>tx w/ Omnicef/Prednisone taper   04/15/2011 [facility]  Patient returns for a post hospitalization visit. Patient was admitted March 29- 04/10/2011 for a COPD exacerbation. Patient presented with progressively worsening cough, congestion, and wheezing. She was treated with IV antibiotics, steroids, and aggressive nebulizer treatments. X-ray showed no acute changes. Once again, she was recommended to quit smoking. She was discharged on Avelox and a prednisone taper. Since discharge. Patient says she is still feeling weak with no energy.  No smoking x 1 week. Has 2 days left on Avelox and Prednisone .  Discussed smoking cesstation.   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 Avelox helped. Back at work, off of prednisone and antibiotics with no cough. Smoking an electronic cigarette now. We discussed smoking cessation again. 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 Chest tight, racing heart no increased use of bronchodilator. She continues Nuvigil "very helpful" for help in her stay awake at work. Trying to get adequate sleep at night. COPD assessment test  (CAT) score 32/40.  01/16/11 Acute OV  Complains of prod cough with brown/green mucus, hoarseness, increased SOB, wheezing, tightness in chest, back pain, chills x2weeks.  finished levaquin yesterday.  Called in levaquin 500mg  x 7 days on 12/30 for above symptoms Some better but not gone completely , still has thick mucus , green at time.  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 Pt c/o chest congestion (not bad right now), she has good and bad days with her breathing, slight cough w/ cream color phlegm, chest tx at times. Increased cough over the past month and chest tightness and postnasal drip. Rescue inhaler and Symbicort have been sufficient. Nebulizer helps in the winter time. Still smoking against advice. Nuvigil still works well for her.  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 Denies fever, chills, sore throat or GI upset. Last got doxycycline and prednisone in June. Is not trying to stop smoking. We discussed this again. CXR 04/07/12 IMPRESSION:  Emphysema without acute cardiopulmonary disease.  Original Report Authenticated By: Dereck Ligas, M.D.  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 PFT 06/27/08- FEV1/FVC 0.51, DLCO 49% severe COPD a1AT 04/19/10- MM nl CXR 05/17/13- mild hyperV, NAD Mostly dyspnea on exertion but notices some dry cough in the last 3 weeks. Always feels tired. She is aware of diagnosis of emphysema and admits stress. Myocardial perfusion imaging study was normal.  she uses her nebulizer machine mostly with if she has a cold. Nuvigil has been a big help with daytime fatigue.  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 Her primary office gave prednisone for a few days, benzonatate, doxycycline and a neb treatment. She has been out of work all week. PFT-07/18/2014-severe obstructive airways disease with response to bronchodilator. FEV1 1.21/48% (+32%), FEV1/FVC 0.51, TLC 95%, DLCO 35% 6MWT 07/18/14- 96%, 98%, 98%, 312 m. Could only walk 4 minutes-stopped because of shortness of breath and dizziness CXR 05/29/14 IMPRESSION: Emphysema without acute disease. Electronically Signed  By: Inge Rise M.D.  On: 05/30/2014 09:12  02/05/2015-60 year old female smoker followed for COPD Gold III-IV, idiopathic hypersomnia/MSLT 4.1, complicated by history depression FOLLOWS FOR:Pt states her breating has not been good; states other people notice her wheezing-she does not.  Pt also notes that she stays tired alot and takes longer to "move.walk,bending over" more at work. Nuclear stress test WNL 04/14/2014 Respiratory infections twice during the fall but okay now. Smoking associated with stress at work. Chantix was some help in past despite "crazy dreams" and she says she may want to try it again some time. Encouraged to stop smoking.  03/05/2015-60 year old female smoker followed for COPD Gold III-IV, idiopathic hypersomnia/MSLT 4 .1, complicated by depression O2 2 L/sleep/APS  Started 02/11/15 based on overnight oximetry ACUTE VISIT: Pt is having hard time breathing; Increased SOB and tight in chest. Seen PCP yesterday and was given  Doxycycline. Worse in the morning; husband states its hard to understand patient when on phone each day due to breathing so bad-getting worse in past several months. Has not been smoking this week due to not feeling well. Loose stools since yesterday as well and drinking lots of water-which is unusal for her. Here with husband Began with bad sore throat, increased SOB esp w exertion. Doxy started yesterday> loose stools started a day before. No N/V Cough remains dry.  Nasal Flu Swab-negative today Walk Test Room Air 03/05/2015-desaturated to 88%  03/23/2015-60 year old female smoker followed for COPD GOLD III-IV, idiopathic hypersomnia/MSL T4 0.1, complicated by depression O2 2 L sleep and exertion/APS CXR 03/05/2015-I reviewed x-ray images with her IMPRESSION: No active lung disease. No change in hyper aeration Electronically Signed  By: Ivar Drape M.D.  On: 03/05/2015 16:48 FOLLOWS FOR: Pt states she is not feeling any better and would like to discuss FMLA and disability.  Pt continues to wheeze. She is trying to stop smoking and has about smoke in the past week. Asks for Chantix which helped before. She has had more frequent exacerbations of her COPD in the past year and has been acutely ill, not feeling that she can work, about every 3 months. She has home- oxygen and handicapped parking. She has discussed disability with her employer and feels that she cannot go on like this without changes.  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 Pt states she has noticed her body gasping for air-never had this happen before-just started in past 2 weeks. Pt takes her POC with her everywhere to use if needed. Pt needs this OV for disability.  Pt is coughing up yellow/brown, thick phelgm since Thursday last week. She is learning to  manage her oxygen. Cutting back on routine errands so that she doesn't do as much outside of the house. We discussed possibility of pulmonary rehabilitation. She is limited more by dyspnea but than by deconditioning. She manages her daytime sleepiness with Provigil and naps.  ROS-see HPI Constitutional:   No-   weight loss, night sweats, fevers, chills, +fatigue, lassitude. HEENT:   No-  headaches, difficulty swallowing, tooth/dental problems, sore throat,       No-  sneezing, itching, ear ache, nasal congestion, +post nasal drip,  CV:  No-   chest pain, orthopnea, PND, swelling in lower extremities, anasarca, dizziness, palpitations Resp: + shortness of breath with exertion or at rest.               productive cough,  + non-productive cough,  No- coughing up of blood.               change in color of mucus.  + wheezing.   Skin: No-   rash or lesions. GI:  No-   heartburn, indigestion, abdominal pain, nausea, vomiting,  GU:  MS:  No-   joint pain or swelling.   Neuro-     nothing unusual Psych:  No- change in mood or affect. No depression or anxiety.  No memory loss.  OBJ- Physical Exam General- Alert, Oriented, Affect-appropriate,  Skin- rash-none, lesions- none, excoriation- none Lymphadenopathy- none Head- atraumatic            Eyes- Gross vision intact, PERRLA, conjunctivae and secretions clear            Ears- Hearing, canals-normal            Nose- clear, no-Septal dev, mucus, polyps, erosion, perforation             Throat- Mallampati II , mucosa clear/ not red , drainage- none, tonsils- atrophic Neck- flexible , trachea midline, no stridor , thyroid nl, carotid no bruit Chest - symmetrical excursion , unlabored           Heart/CV- RRR , no murmur , no gallop  , no rub, nl s1 s2                           - JVD- none , edema- none, stasis changes- none, varices- none           Lung-  wheeze + with expiration, cough + light , dullness-none, rub- none, + faint rhonchi scattered,                                + distant breath sounds           Chest wall-  Abd-  Br/ Gen/ Rectal- Not done, not indicated Extrem- cyanosis- none, clubbing, none, atrophy- none, strength- nl Neuro- grossly intact to observation

## 2015-04-21 NOTE — Assessment & Plan Note (Signed)
There is an active bronchitis component now based on change in sputum color. We discussed measures for keeping her out of the emergency room. Plan- cefdinir, tramadol for cough

## 2015-04-21 NOTE — Assessment & Plan Note (Signed)
Continued support and encouragement to stop completely

## 2015-04-21 NOTE — Assessment & Plan Note (Signed)
She continues to need oxygen. There may be some days when she can be more active without it, but the normal course of her disease is towards worsening.

## 2015-04-24 ENCOUNTER — Other Ambulatory Visit: Payer: Self-pay | Admitting: Family Medicine

## 2015-04-27 NOTE — Telephone Encounter (Signed)
Medication filled to pharmacy as requested.   

## 2015-05-01 ENCOUNTER — Telehealth: Payer: Self-pay | Admitting: Internal Medicine

## 2015-05-01 NOTE — Telephone Encounter (Signed)
Spoke with pt, calling to follow up on disability forms.  Per Joellen Jersey, these have been faxed approx 5 minutes ago.  Pt expressed understanding of this.  Nothing further needed.

## 2015-05-05 ENCOUNTER — Encounter: Payer: Self-pay | Admitting: Family Medicine

## 2015-05-05 ENCOUNTER — Telehealth: Payer: Self-pay | Admitting: Family Medicine

## 2015-05-05 ENCOUNTER — Ambulatory Visit (INDEPENDENT_AMBULATORY_CARE_PROVIDER_SITE_OTHER): Payer: 59 | Admitting: Family Medicine

## 2015-05-05 VITALS — BP 134/88 | HR 81 | Temp 98.1°F | Resp 16 | Ht 62.0 in | Wt 128.4 lb

## 2015-05-05 DIAGNOSIS — Z0000 Encounter for general adult medical examination without abnormal findings: Secondary | ICD-10-CM | POA: Diagnosis not present

## 2015-05-05 LAB — BASIC METABOLIC PANEL
BUN: 15 mg/dL (ref 6–23)
CHLORIDE: 101 meq/L (ref 96–112)
CO2: 30 mEq/L (ref 19–32)
Calcium: 9.5 mg/dL (ref 8.4–10.5)
Creatinine, Ser: 0.54 mg/dL (ref 0.40–1.20)
GFR: 123.09 mL/min (ref >60.00)
Glucose, Bld: 81 mg/dL (ref 70–99)
POTASSIUM: 3.8 meq/L (ref 3.5–5.1)
SODIUM: 139 meq/L (ref 135–145)

## 2015-05-05 LAB — CBC WITH DIFFERENTIAL/PLATELET
BASOS ABS: 0.1 10*3/uL (ref 0.0–0.1)
BASOS PCT: 0.9 % (ref 0.0–3.0)
EOS PCT: 2.8 % (ref 0.0–5.0)
Eosinophils Absolute: 0.2 10*3/uL (ref 0.0–0.7)
HEMATOCRIT: 45.9 % (ref 36.0–46.0)
Hemoglobin: 15.5 g/dL — ABNORMAL HIGH (ref 12.0–15.0)
LYMPHS ABS: 1.9 10*3/uL (ref 0.7–4.0)
LYMPHS PCT: 26.5 % (ref 12.0–46.0)
MCHC: 33.8 g/dL (ref 30.0–36.0)
MCV: 96.9 fl (ref 78.0–100.0)
MONOS PCT: 6.6 % (ref 3.0–12.0)
Monocytes Absolute: 0.5 10*3/uL (ref 0.1–1.0)
NEUTROS ABS: 4.5 10*3/uL (ref 1.4–7.7)
Neutrophils Relative %: 63.2 % (ref 43.0–77.0)
PLATELETS: 328 10*3/uL (ref 150.0–400.0)
RBC: 4.74 Mil/uL (ref 3.87–5.11)
RDW: 13.4 % (ref 11.5–15.5)
WBC: 7.1 10*3/uL (ref 4.0–10.5)

## 2015-05-05 LAB — HEPATIC FUNCTION PANEL
ALK PHOS: 85 U/L (ref 39–117)
ALT: 17 U/L (ref 0–35)
AST: 14 U/L (ref 0–37)
Albumin: 4.4 g/dL (ref 3.5–5.2)
BILIRUBIN DIRECT: 0.1 mg/dL (ref 0.0–0.3)
BILIRUBIN TOTAL: 0.5 mg/dL (ref 0.2–1.2)
Total Protein: 6.9 g/dL (ref 6.0–8.3)

## 2015-05-05 LAB — VITAMIN D 25 HYDROXY (VIT D DEFICIENCY, FRACTURES): VITD: 15.38 ng/mL — ABNORMAL LOW (ref 30.00–100.00)

## 2015-05-05 LAB — LIPID PANEL
CHOL/HDL RATIO: 4
CHOLESTEROL: 271 mg/dL — AB (ref 0–200)
HDL: 72.3 mg/dL (ref >39.00)
LDL CALC: 178 mg/dL — AB (ref 0–99)
NonHDL: 198.34
TRIGLYCERIDES: 103 mg/dL (ref 0.0–149.0)
VLDL: 20.6 mg/dL (ref 0.0–40.0)

## 2015-05-05 LAB — TSH: TSH: 2.96 u[IU]/mL (ref 0.35–4.50)

## 2015-05-05 MED ORDER — LOSARTAN POTASSIUM-HCTZ 100-25 MG PO TABS: 1.0000 | ORAL_TABLET | Freq: Every day | ORAL | Status: DC

## 2015-05-05 MED ORDER — BUPROPION HCL ER (XL) 150 MG PO TB24: 150.0000 mg | ORAL_TABLET | Freq: Every day | ORAL | Status: DC

## 2015-05-05 MED ORDER — OMEPRAZOLE 40 MG PO CPDR: DELAYED_RELEASE_CAPSULE | ORAL | Status: DC

## 2015-05-05 MED ORDER — MONTELUKAST SODIUM 10 MG PO TABS: 10.0000 mg | ORAL_TABLET | Freq: Every day | ORAL | Status: DC

## 2015-05-05 MED ORDER — POTASSIUM CHLORIDE CRYS ER 20 MEQ PO TBCR: 20.0000 meq | EXTENDED_RELEASE_TABLET | Freq: Every day | ORAL | Status: DC

## 2015-05-05 MED ORDER — ESCITALOPRAM OXALATE 20 MG PO TABS
20.0000 mg | ORAL_TABLET | Freq: Every day | ORAL | Status: DC
Start: 2015-05-05 — End: 2015-10-27

## 2015-05-05 NOTE — Progress Notes (Signed)
   Subjective:    Patient ID: Carol Quinn, female    DOB: Sep 13, 1955, 60 y.o.   MRN: [id number]  HPI CPE- no need for paps due to hysterectomy.  UTD on mammo.  Due for colonoscopy but prefers cologuard.  Pt now using home and portable O2 for COPD.   Review of Systems Patient reports no vision/ hearing changes, adenopathy,fever, weight change,  persistant/recurrent hoarseness , swallowing issues, chest pain, palpitations, edema, hemoptysis, gastrointestinal bleeding (melena, rectal bleeding), abdominal pain, significant heartburn, bowel changes, GU symptoms (dysuria, hematuria, incontinence), Gyn symptoms (abnormal  bleeding, pain),  syncope, focal weakness, memory loss, numbness & tingling, skin/hair/nail changes, abnormal bruising or bleeding, anxiety, or depression.   + chronic SOB/cough    Objective:   Physical Exam General Appearance:    Alert, cooperative, no distress, appears stated age  Head:    Normocephalic, without obvious abnormality, atraumatic  Eyes:    PERRL, conjunctiva/corneas clear, EOM's intact, fundi    benign, both eyes  Ears:    Normal TM's and external ear canals, both ears  Nose:   Nares normal, septum midline, mucosa normal, no drainage    or sinus tenderness  Throat:   Lips, mucosa, and tongue normal; teeth and gums normal  Neck:   Supple, symmetrical, trachea midline, no adenopathy;    Thyroid: no enlargement/tenderness/nodules  Back:     Symmetric, no curvature, ROM normal, no CVA tenderness  Lungs:     Scattered expiratory wheezes  Chest Wall:    No tenderness or deformity   Heart:    Regular rate and rhythm, S1 and S2 normal, no murmur, rub   or gallop  Breast Exam:    Deferred to GYN  Abdomen:     Soft, non-tender, bowel sounds active all four quadrants,    no masses, no organomegaly  Genitalia:    Deferred to GYN  Rectal:    Extremities:   Extremities normal, atraumatic, no cyanosis or edema  Pulses:   2+ and symmetric all extremities  Skin:    Skin color, texture, turgor normal, no rashes or lesions  Lymph nodes:   Cervical, supraclavicular, and axillary nodes normal  Neurologic:   CNII-XII intact, normal strength, sensation and reflexes    throughout          Assessment & Plan:

## 2015-05-05 NOTE — Assessment & Plan Note (Signed)
Pt's PE unchanged from previous and WNL w/ exception of wheezing.  UTD on mammo, GYN.  Due for colonoscopy but prefers cologuard.  She is going to check w/ her insurance and let us know if we are able to proceed w/ order.  Check labs.  Anticipatory guidance provided.

## 2015-05-05 NOTE — Patient Instructions (Signed)
Follow up in 6 months to recheck BP and cholesterol We'll notify you of your lab results and make any changes if needed Please call your insurance company about the Cologuard- if you decide to proceed, please let me know and we'll send in the form Call with any questions or concerns Thanks for sticking with [place]! Hang in there!!!

## 2015-05-05 NOTE — Telephone Encounter (Signed)
Pt states that ins does not cover a cologuard test. Pt states that she has looked on line and has found Dr Carlean Purl and Dr Hilarie Fredrickson in the [facility] group and asking which one would be best to do a colonoscopy. Pt states that she does not need a referral that she can make the appt.

## 2015-05-05 NOTE — Progress Notes (Signed)
Pre visit review using our clinic review tool, if applicable. No additional management support is needed unless otherwise documented below in the visit note. 

## 2015-05-05 NOTE — Telephone Encounter (Signed)
Both are fantastic!

## 2015-05-06 ENCOUNTER — Other Ambulatory Visit: Payer: Self-pay | Admitting: General Practice

## 2015-05-06 DIAGNOSIS — E785 Hyperlipidemia, unspecified: Secondary | ICD-10-CM

## 2015-05-06 MED ORDER — VITAMIN D (ERGOCALCIFEROL) 1.25 MG (50000 UNIT) PO CAPS: 50000.0000 [IU] | ORAL_CAPSULE | ORAL | Status: DC

## 2015-05-06 MED ORDER — ATORVASTATIN CALCIUM 20 MG PO TABS
20.0000 mg | ORAL_TABLET | Freq: Every day | ORAL | Status: DC
Start: 2015-05-06 — End: 2016-04-09

## 2015-05-06 NOTE — Telephone Encounter (Signed)
Pt.notified

## 2015-05-06 NOTE — Telephone Encounter (Signed)
Called pt could not leave a voicemail.

## 2015-06-01 ENCOUNTER — Encounter: Payer: Self-pay | Admitting: Internal Medicine

## 2015-06-01 ENCOUNTER — Ambulatory Visit (INDEPENDENT_AMBULATORY_CARE_PROVIDER_SITE_OTHER): Payer: 59 | Admitting: Internal Medicine

## 2015-06-01 VITALS — BP 118/72 | HR 87 | Ht 62.0 in | Wt 128.4 lb

## 2015-06-01 DIAGNOSIS — J9611 Chronic respiratory failure with hypoxia: Secondary | ICD-10-CM

## 2015-06-01 DIAGNOSIS — J449 Chronic obstructive pulmonary disease, unspecified: Secondary | ICD-10-CM

## 2015-06-01 DIAGNOSIS — G4710 Hypersomnia, unspecified: Secondary | ICD-10-CM

## 2015-06-01 NOTE — Assessment & Plan Note (Signed)
Depends on oxygen for ADLs around the house. She leaves it off while trying to cook were while showering, with very limited exercise tolerance. She no longer seems able to return to her previous job because of the difficulty getting to and from work. Plan-normal expected course for this illness is gradual, stepwise deterioration. I don't see anything reversible. Recommend total and permanent disability application be started. Pulmonary rehabilitation program.

## 2015-06-01 NOTE — Assessment & Plan Note (Signed)
I still don't get a history of cataplexy, sleep paralysis or hypnagogic hallucination. Can't exclude a component from her depression but pattern best fits idiopathic hypersomnia diagnosis. Plan-continue Provigil

## 2015-06-01 NOTE — Patient Instructions (Signed)
Total and Permanent Disability as of today.   Letter written.  Order- referral to Pulmonary Rehab   Dx COPD GOLD III  Please call as needed

## 2015-06-01 NOTE — Assessment & Plan Note (Signed)
[place] qualified to start sleep O2 2l/ APS 02/11/15 Walk test desat to 88% room air 03/05/15 qualified for sleep and exertion O2 2-3l Now probably at baseline oxygenates adequately at rest on room air but with very low exertion tolerance especially off oxygen.

## 2015-06-01 NOTE — Progress Notes (Signed)
Patient ID: Carol Quinn, female    DOB: 02/27/1955, 60 y.o.   MRN: [id number]  HPI 69 yoF former smoker followed for COPD and hypersomnia. Last here Sept 19, when she had been sick after a cruise. She did gradually get better. Comes now- to f/u after MSLT done for assessment of her persistent c/o "tired". Today she also says she has been having some sore throat this week w/o fever. Some dry cough. Denies fever or nodes. Lab- MSLT 04/28/10- short at 4.1 minutes, SOREM 0, so nonspecific, c/w primary ideopathic hypersomnia.  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 Still has a sense of fatigue, feeling she can't put one foot in front of another,  and easy confusion. Caffeine has no effect. Thyroid has been checked a lot. Sister has lupus, but she has tested negative.  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 We have again discussed motivation and support for quitting . Acute visit- Called 09/13/10 - On call Dr. gave prednisone taper and Zpak for 4 day of hx cough, green sputum and SOB. 1 day fever. No better so far with this Rx. Using her inhaler more.   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 Continues to smoke against advice. Hydromet cough syrup made her "paranoid"-she cried and screamed. 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 Nuvigil has allowed her to get up and get to work on a regular basis while sleeping soundly at night. If she skips a dose she expects to sleep all day. Her breathing has been stable with Symbicort. She rarely needs a rescue inhaler. She has had no acute respiratory illness  recently.  04/06/2011 Acute OV  Complains of prod cough with brown mucus, increased SOB, wheezing, tightness in chest x1.5week, progessively getting worse.  Has been using over-the-counter cough products, without any relief. Plays increased wheezing, and shortness of breath over last few days. We talked adeptly about smoking cessation. She denies any hemoptysis, chest pain, orthopnea, PND, or leg swelling. >>tx w/ Omnicef/Prednisone taper   04/15/2011 [facility]  Patient returns for a post hospitalization visit. Patient was admitted March 29- 04/10/2011 for a COPD exacerbation. Patient presented with progressively worsening cough, congestion, and wheezing. She was treated with IV antibiotics, steroids, and aggressive nebulizer treatments. X-ray showed no acute changes. Once again, she was recommended to quit smoking. She was discharged on Avelox and a prednisone taper. Since discharge. Patient says she is still feeling weak with no energy.  No smoking x 1 week. Has 2 days left on Avelox and Prednisone .  Discussed smoking cesstation.   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 Avelox helped. Back at work, off of prednisone and antibiotics with no cough. Smoking an electronic cigarette now. We discussed smoking cessation again. 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 Chest tight, racing heart no increased use of bronchodilator. She continues Nuvigil "very helpful" for help in her stay awake at work. Trying to get adequate sleep at night. COPD assessment test  (CAT) score 32/40.  01/16/11 Acute OV  Complains of prod cough with brown/green mucus, hoarseness, increased SOB, wheezing, tightness in chest, back pain, chills x2weeks.  finished levaquin yesterday.  Called in levaquin 500mg  x 7 days on 12/30 for above symptoms Some better but not gone completely , still has thick mucus , green at time.  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 Pt c/o chest congestion (not bad right now), she has good and bad days with her breathing, slight cough w/ cream color phlegm, chest tx at times. Increased cough over the past month and chest tightness and postnasal drip. Rescue inhaler and Symbicort have been sufficient. Nebulizer helps in the winter time. Still smoking against advice. Nuvigil still works well for her.  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 Denies fever, chills, sore throat or GI upset. Last got doxycycline and prednisone in June. Is not trying to stop smoking. We discussed this again. CXR 04/07/12 IMPRESSION:  Emphysema without acute cardiopulmonary disease.  Original Report Authenticated By: Dereck Ligas, M.D.  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 PFT 06/27/08- FEV1/FVC 0.51, DLCO 49% severe COPD a1AT 04/19/10- MM nl CXR 05/17/13- mild hyperV, NAD Mostly dyspnea on exertion but notices some dry cough in the last 3 weeks. Always feels tired. She is aware of diagnosis of emphysema and admits stress. Myocardial perfusion imaging study was normal.  she uses her nebulizer machine mostly with if she has a cold. Nuvigil has been a big help with daytime fatigue.  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 Her primary office gave prednisone for a few days, benzonatate, doxycycline and a neb treatment. She has been out of work all week. PFT-07/18/2014-severe obstructive airways disease with response to bronchodilator. FEV1 1.21/48% (+32%), FEV1/FVC 0.51, TLC 95%, DLCO 35% 6MWT 07/18/14- 96%, 98%, 98%, 312 m. Could only walk 4 minutes-stopped because of shortness of breath and dizziness CXR 05/29/14 IMPRESSION: Emphysema without acute disease. Electronically Signed  By: Inge Rise M.D.  On: 05/30/2014 09:12  02/05/2015-60 year old female smoker followed for COPD Gold III-IV, idiopathic hypersomnia/MSLT 4.1, complicated by history depression FOLLOWS FOR:Pt states her breating has not been good; states other people notice her wheezing-she does not.  Pt also notes that she stays tired alot and takes longer to "move.walk,bending over" more at work. Nuclear stress test WNL 04/14/2014 Respiratory infections twice during the fall but okay now. Smoking associated with stress at work. Chantix was some help in past despite "crazy dreams" and she says she may want to try it again some time. Encouraged to stop smoking.  03/05/2015-60 year old female smoker followed for COPD Gold III-IV, idiopathic hypersomnia/MSLT 4 .1, complicated by depression O2 2 L/sleep/APS  Started 02/11/15 based on overnight oximetry ACUTE VISIT: Pt is having hard time breathing; Increased SOB and tight in chest. Seen PCP yesterday and was given  Doxycycline. Worse in the morning; husband states its hard to understand patient when on phone each day due to breathing so bad-getting worse in past several months. Has not been smoking this week due to not feeling well. Loose stools since yesterday as well and drinking lots of water-which is unusal for her. Here with husband Began with bad sore throat, increased SOB esp w exertion. Doxy started yesterday> loose stools started a day before. No N/V Cough remains dry.  Nasal Flu Swab-negative today Walk Test Room Air 03/05/2015-desaturated to 88%  03/23/2015-60 year old female smoker followed for COPD GOLD III-IV, idiopathic hypersomnia/MSL T4 0.1, complicated by depression O2 2 L sleep and exertion/APS CXR 03/05/2015-I reviewed x-ray images with her IMPRESSION: No active lung disease. No change in hyper aeration Electronically Signed  By: Ivar Drape M.D.  On: 03/05/2015 16:48 FOLLOWS FOR: Pt states she is not feeling any better and would like to discuss FMLA and disability.  Pt continues to wheeze. She is trying to stop smoking and has about smoke in the past week. Asks for Chantix which helped before. She has had more frequent exacerbations of her COPD in the past year and has been acutely ill, not feeling that she can work, about every 3 months. She has home- oxygen and handicapped parking. She has discussed disability with her employer and feels that she cannot go on like this without changes.  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 Pt states she has noticed her body gasping for air-never had this happen before-just started in past 2 weeks. Pt takes her POC with her everywhere to use if needed. Pt needs this OV for disability.  Pt is coughing up yellow/brown, thick phelgm since Thursday last week. She is learning to  manage her oxygen. Cutting back on routine errands so that she doesn't do as much outside of the house. We discussed possibility of pulmonary rehabilitation. She is limited more by dyspnea but than by deconditioning. She manages her daytime sleepiness with Provigil and naps.  06/01/2015-60 year old female smoker followed for COPD GOLD III-IV, chronic hypoxic respiratory failure, idiopathic hypersomnia/  MSLT 4.1 minutes, complicated by depression O2 2 L sleep/exertion/APS FOLLOWS FOR: Pt contines to wear O2 at 2lpm through APS. Pt contines to have trouble with breathing with exertion. She describes very easy dyspnea on exertion if she doesn't wear oxygen, such as while cooking. Husband is doing all of the heavy housework now. In the kitchen she has to sit down periodically to rest 2 or 3 times for preparing a meal. Husband helps her at the grocery store. Exhausted taking shower, has to rest before dressing and then has to rest again for moving on. She reports that Sr., nurse, visited and commented that off oxygen, her lips looked cyanotic. Using nebulizer about once daily, rescue inhaler about once daily. Continues other meds. Daily Provigil helps her control daytime sleepiness. Without it feels extremely sleepy feels weak with prolonged standing but does not describe cataplexy or sleep paralysis She is at the end of her short-term disability. Her job is sedentary, but she says she is not physically able  to get ready in the morning, into her office and at work on time because of need to stop and rest repeatedly. She doesn't feel she can do that work any longer. Daily productive cough white or slightly yellow sputum. No blood, no anginal pain, no palpitation. Nearly complete smoking cessation.  ROS-see HPI Constitutional:   No-   weight loss, night sweats, fevers, chills, +fatigue, lassitude. HEENT:   No-  headaches, difficulty swallowing, tooth/dental problems, sore throat,       No-  sneezing,  itching, ear ache, nasal congestion, +post nasal drip,  CV:  No-   chest pain, orthopnea, PND, swelling in lower extremities, anasarca, dizziness, palpitations Resp: + shortness of breath with exertion or at rest.               productive cough,  + non-productive cough,  No- coughing up of blood.               change in color of mucus.  + wheezing.   Skin: No-   rash or lesions. GI:  No-   heartburn, indigestion, abdominal pain, nausea, vomiting,  GU:  MS:  No-   joint pain or swelling.   Neuro-     nothing unusual Psych:  No- change in mood or affect. No depression or anxiety.  No memory loss.  OBJ- Physical Exam   General- Alert, Oriented, Affect-appropriate,   Skin- + incidental fine papular excoriated rash upper abdomen developed after heat outdoors, sweating over weekend, consistent with intertrigo. Lymphadenopathy- none Head- atraumatic            Eyes- Gross vision intact, PERRLA, conjunctivae and secretions clear            Ears- Hearing, canals-normal            Nose- clear, no-Septal dev, mucus, polyps, erosion, perforation             Throat- Mallampati II , mucosa clear/ not red , drainage- none, tonsils- atrophic Neck- flexible , trachea midline, no stridor , thyroid nl, carotid no bruit Chest - symmetrical excursion , unlabored           Heart/CV- RRR , no murmur , no gallop  , no rub, nl s1 s2                           - JVD- none , edema- none, stasis changes- none, varices- none           Lung-  wheeze + with expiration, cough + light , dullness-none, rub- none, + faint rhonchi scattered,                               + distant breath sounds           Chest wall-  Abd-  Br/ Gen/ Rectal- Not done, not indicated Extrem- cyanosis- none, clubbing, none, atrophy- none, strength- nl Neuro- grossly intact to observation

## 2015-06-05 ENCOUNTER — Telehealth: Payer: Self-pay | Admitting: Internal Medicine

## 2015-06-05 NOTE — Telephone Encounter (Signed)
I notified patient disability form complete. She will pick up at front next week.

## 2015-06-05 NOTE — Telephone Encounter (Signed)
Forms with phone message placed on CY's cart.

## 2015-06-09 ENCOUNTER — Telehealth: Payer: Self-pay | Admitting: Internal Medicine

## 2015-06-09 NOTE — Telephone Encounter (Signed)
Patient states that she is calling about forms from [employer].  They faxed them on May 19th and May 24th and have not received back. Called and spoke with Sonia Baller at [facility].  She states that she has papers, she said that patient needs to fill out authorization and pay $25 before papers can be completed. She said that they will send out packet to patient tomorrow indicating where she needs to send the $25 payment and the authorization form.  Called and advised patient, she says that she has already completed an authorization form and she does not need to pay $25, this needs to be billed to [facility] because they are requesting the information.  Gave her [employer]'s phone number so she could discuss with them directly what she needs to do.    Nothing further needed.

## 2015-06-10 ENCOUNTER — Telehealth: Payer: Self-pay | Admitting: Internal Medicine

## 2015-06-10 NOTE — Telephone Encounter (Signed)
Attempted to contact pt. No answer, no option to leave a message. Will try back.  

## 2015-06-11 ENCOUNTER — Telehealth: Payer: Self-pay | Admitting: Internal Medicine

## 2015-06-11 NOTE — Telephone Encounter (Signed)
Spoke with pt. She is aware that CY will change the date. She is going to bring them by today. Nothing further was needed.

## 2015-06-11 NOTE — Telephone Encounter (Signed)
Forms corrected by CY and he personally returned forms to patient. Nothing more needed at this time.

## 2015-06-11 NOTE — Telephone Encounter (Signed)
Per CY-He marked that as onset of T and P disability. Yes he is okay with changing the date.

## 2015-06-11 NOTE — Telephone Encounter (Signed)
Spoke with pt. States that the dates on her short term disability. The start date of her disability was put as 06/01/15. Pt states that her employer will not accept this as CY took her out of work in March. She needs the start date to be changed to 03/23/15. Pt states that she will bring a copy of the forms to [place] to be corrected.  CY - are you okay with changing this date?

## 2015-06-11 NOTE — Telephone Encounter (Signed)
Forms have been given to [facility] per her request. Will route message to her to follow up.

## 2015-06-19 ENCOUNTER — Other Ambulatory Visit (INDEPENDENT_AMBULATORY_CARE_PROVIDER_SITE_OTHER): Payer: 59

## 2015-06-19 DIAGNOSIS — E785 Hyperlipidemia, unspecified: Secondary | ICD-10-CM | POA: Diagnosis not present

## 2015-06-19 LAB — HEPATIC FUNCTION PANEL
ALBUMIN: 4.5 g/dL (ref 3.5–5.2)
ALK PHOS: 92 U/L (ref 39–117)
ALT: 18 U/L (ref 0–35)
AST: 16 U/L (ref 0–37)
Bilirubin, Direct: 0.1 mg/dL (ref 0.0–0.3)
TOTAL PROTEIN: 6.8 g/dL (ref 6.0–8.3)
Total Bilirubin: 0.6 mg/dL (ref 0.2–1.2)

## 2015-07-23 ENCOUNTER — Ambulatory Visit (INDEPENDENT_AMBULATORY_CARE_PROVIDER_SITE_OTHER): Payer: 59 | Admitting: Internal Medicine

## 2015-07-23 ENCOUNTER — Encounter: Payer: Self-pay | Admitting: Internal Medicine

## 2015-07-23 VITALS — BP 128/78 | HR 79 | Ht 62.0 in | Wt 126.8 lb

## 2015-07-23 DIAGNOSIS — J449 Chronic obstructive pulmonary disease, unspecified: Secondary | ICD-10-CM | POA: Diagnosis not present

## 2015-07-23 DIAGNOSIS — J309 Allergic rhinitis, unspecified: Secondary | ICD-10-CM

## 2015-07-23 DIAGNOSIS — Z720 Tobacco use: Secondary | ICD-10-CM | POA: Diagnosis not present

## 2015-07-23 DIAGNOSIS — J9611 Chronic respiratory failure with hypoxia: Secondary | ICD-10-CM

## 2015-07-23 DIAGNOSIS — J302 Other seasonal allergic rhinitis: Secondary | ICD-10-CM

## 2015-07-23 DIAGNOSIS — J3089 Other allergic rhinitis: Secondary | ICD-10-CM

## 2015-07-23 DIAGNOSIS — G4710 Hypersomnia, unspecified: Secondary | ICD-10-CM

## 2015-07-23 MED ORDER — FLUTTER DEVI: Status: DC

## 2015-07-23 NOTE — Patient Instructions (Signed)
Script printed for Flutter device to loosen mucus in your airways     Blow through 4 times per set, Three sets per day, when needed.  Ok to continue O2- our goal is to keep O2 saturation usually 89-94%   Ok to use an antihistamine, a decongestant like sudafed, saline nasal spray as needed  Consider trying otc Flonase/ fluticasone nasal spray  1-2 puffs each nostril once daily at bedtime while needed

## 2015-07-23 NOTE — Assessment & Plan Note (Signed)
At baseline now with current meds and no recent exacerbation Plan-Flutter device to help clear secretions

## 2015-07-23 NOTE — Progress Notes (Signed)
Patient ID: Carol Quinn, female    DOB: 02/27/1955, 60 y.o.   MRN: [id number]  HPI 69 yoF former smoker followed for COPD and hypersomnia. Last here Sept 19, when she had been sick after a cruise. She did gradually get better. Comes now- to f/u after MSLT done for assessment of her persistent c/o "tired". Today she also says she has been having some sore throat this week w/o fever. Some dry cough. Denies fever or nodes. Lab- MSLT 04/28/10- short at 4.1 minutes, SOREM 0, so nonspecific, c/w primary ideopathic hypersomnia.  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 Still has a sense of fatigue, feeling she can't put one foot in front of another,  and easy confusion. Caffeine has no effect. Thyroid has been checked a lot. Sister has lupus, but she has tested negative.  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 We have again discussed motivation and support for quitting . Acute visit- Called 09/13/10 - On call Dr. gave prednisone taper and Zpak for 4 day of hx cough, green sputum and SOB. 1 day fever. No better so far with this Rx. Using her inhaler more.   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 Continues to smoke against advice. Hydromet cough syrup made her "paranoid"-she cried and screamed. 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 Nuvigil has allowed her to get up and get to work on a regular basis while sleeping soundly at night. If she skips a dose she expects to sleep all day. Her breathing has been stable with Symbicort. She rarely needs a rescue inhaler. She has had no acute respiratory illness  recently.  04/06/2011 Acute OV  Complains of prod cough with brown mucus, increased SOB, wheezing, tightness in chest x1.5week, progessively getting worse.  Has been using over-the-counter cough products, without any relief. Plays increased wheezing, and shortness of breath over last few days. We talked adeptly about smoking cessation. She denies any hemoptysis, chest pain, orthopnea, PND, or leg swelling. >>tx w/ Omnicef/Prednisone taper   04/15/2011 [facility]  Patient returns for a post hospitalization visit. Patient was admitted March 29- 04/10/2011 for a COPD exacerbation. Patient presented with progressively worsening cough, congestion, and wheezing. She was treated with IV antibiotics, steroids, and aggressive nebulizer treatments. X-ray showed no acute changes. Once again, she was recommended to quit smoking. She was discharged on Avelox and a prednisone taper. Since discharge. Patient says she is still feeling weak with no energy.  No smoking x 1 week. Has 2 days left on Avelox and Prednisone .  Discussed smoking cesstation.   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 Avelox helped. Back at work, off of prednisone and antibiotics with no cough. Smoking an electronic cigarette now. We discussed smoking cessation again. 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 Chest tight, racing heart no increased use of bronchodilator. She continues Nuvigil "very helpful" for help in her stay awake at work. Trying to get adequate sleep at night. COPD assessment test  (CAT) score 32/40.  01/16/11 Acute OV  Complains of prod cough with brown/green mucus, hoarseness, increased SOB, wheezing, tightness in chest, back pain, chills x2weeks.  finished levaquin yesterday.  Called in levaquin 500mg  x 7 days on 12/30 for above symptoms Some better but not gone completely , still has thick mucus , green at time.  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 Pt c/o chest congestion (not bad right now), she has good and bad days with her breathing, slight cough w/ cream color phlegm, chest tx at times. Increased cough over the past month and chest tightness and postnasal drip. Rescue inhaler and Symbicort have been sufficient. Nebulizer helps in the winter time. Still smoking against advice. Nuvigil still works well for her.  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 Denies fever, chills, sore throat or GI upset. Last got doxycycline and prednisone in June. Is not trying to stop smoking. We discussed this again. CXR 04/07/12 IMPRESSION:  Emphysema without acute cardiopulmonary disease.  Original Report Authenticated By: Dereck Ligas, M.D.  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 PFT 06/27/08- FEV1/FVC 0.51, DLCO 49% severe COPD a1AT 04/19/10- MM nl CXR 05/17/13- mild hyperV, NAD Mostly dyspnea on exertion but notices some dry cough in the last 3 weeks. Always feels tired. She is aware of diagnosis of emphysema and admits stress. Myocardial perfusion imaging study was normal.  she uses her nebulizer machine mostly with if she has a cold. Nuvigil has been a big help with daytime fatigue.  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 Her primary office gave prednisone for a few days, benzonatate, doxycycline and a neb treatment. She has been out of work all week. PFT-07/18/2014-severe obstructive airways disease with response to bronchodilator. FEV1 1.21/48% (+32%), FEV1/FVC 0.51, TLC 95%, DLCO 35% 6MWT 07/18/14- 96%, 98%, 98%, 312 m. Could only walk 4 minutes-stopped because of shortness of breath and dizziness CXR 05/29/14 IMPRESSION: Emphysema without acute disease. Electronically Signed  By: Inge Rise M.D.  On: 05/30/2014 09:12  02/05/2015-60 year old female smoker followed for COPD Gold III-IV, idiopathic hypersomnia/MSLT 4.1, complicated by history depression FOLLOWS FOR:Pt states her breating has not been good; states other people notice her wheezing-she does not.  Pt also notes that she stays tired alot and takes longer to "move.walk,bending over" more at work. Nuclear stress test WNL 04/14/2014 Respiratory infections twice during the fall but okay now. Smoking associated with stress at work. Chantix was some help in past despite "crazy dreams" and she says she may want to try it again some time. Encouraged to stop smoking.  03/05/2015-60 year old female smoker followed for COPD Gold III-IV, idiopathic hypersomnia/MSLT 4 .1, complicated by depression O2 2 L/sleep/APS  Started 02/11/15 based on overnight oximetry ACUTE VISIT: Pt is having hard time breathing; Increased SOB and tight in chest. Seen PCP yesterday and was given  Doxycycline. Worse in the morning; husband states its hard to understand patient when on phone each day due to breathing so bad-getting worse in past several months. Has not been smoking this week due to not feeling well. Loose stools since yesterday as well and drinking lots of water-which is unusal for her. Here with husband Began with bad sore throat, increased SOB esp w exertion. Doxy started yesterday> loose stools started a day before. No N/V Cough remains dry.  Nasal Flu Swab-negative today Walk Test Room Air 03/05/2015-desaturated to 88%  03/23/2015-60 year old female smoker followed for COPD GOLD III-IV, idiopathic hypersomnia/MSL T4 0.1, complicated by depression O2 2 L sleep and exertion/APS CXR 03/05/2015-I reviewed x-ray images with her IMPRESSION: No active lung disease. No change in hyper aeration Electronically Signed  By: Ivar Drape M.D.  On: 03/05/2015 16:48 FOLLOWS FOR: Pt states she is not feeling any better and would like to discuss FMLA and disability.  Pt continues to wheeze. She is trying to stop smoking and has about smoke in the past week. Asks for Chantix which helped before. She has had more frequent exacerbations of her COPD in the past year and has been acutely ill, not feeling that she can work, about every 3 months. She has home- oxygen and handicapped parking. She has discussed disability with her employer and feels that she cannot go on like this without changes.  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 Pt states she has noticed her body gasping for air-never had this happen before-just started in past 2 weeks. Pt takes her POC with her everywhere to use if needed. Pt needs this OV for disability.  Pt is coughing up yellow/brown, thick phelgm since Thursday last week. She is learning to  manage her oxygen. Cutting back on routine errands so that she doesn't do as much outside of the house. We discussed possibility of pulmonary rehabilitation. She is limited more by dyspnea but than by deconditioning. She manages her daytime sleepiness with Provigil and naps.  06/01/2015-60 year old female smoker followed for COPD GOLD III-IV, chronic hypoxic respiratory failure, idiopathic hypersomnia/  MSLT 4.1 minutes, complicated by depression O2 2 L sleep/exertion/APS FOLLOWS FOR: Pt contines to wear O2 at 2lpm through APS. Pt contines to have trouble with breathing with exertion. She describes very easy dyspnea on exertion if she doesn't wear oxygen, such as while cooking. Husband is doing all of the heavy housework now. In the kitchen she has to sit down periodically to rest 2 or 3 times for preparing a meal. Husband helps her at the grocery store. Exhausted taking shower, has to rest before dressing and then has to rest again for moving on. She reports that Sr., nurse, visited and commented that off oxygen, her lips looked cyanotic. Using nebulizer about once daily, rescue inhaler about once daily. Continues other meds. Daily Provigil helps her control daytime sleepiness. Without it feels extremely sleepy feels weak with prolonged standing but does not describe cataplexy or sleep paralysis She is at the end of her short-term disability. Her job is sedentary, but she says she is not physically able  to get ready in the morning, into her office and at work on time because of need to stop and rest repeatedly. She doesn't feel she can do that work any longer. Daily productive cough white or slightly yellow sputum. No blood, no anginal pain, no palpitation. Nearly complete smoking cessation.  07/23/2015-60 year old female smoker followed for COPD Gold III-IV, chronic hypoxic respiratory failure, idiopathic hypersomnia- MSLT 4.1 minutes complicated by depression O2 2 L sleep and as needed/APS FOLLOWS  FOR: Pt has started using nebulizer more often with this heat and productive cough-brown/green tinge in color. ? infection Increased sense of chest congestion and postnasal drip associated with hot weather. No fever or sore throat. Oxygen definitely helps and has markedly reduced morning headache. Using rescue inhaler 0-6 times daily-discussed. Mostly uses more effective outdoors. Some jitter with this medicine.              Bevespi sample did not seem to work as well as Symbicort plus Spiriva. She is satisfied with her meds. Wants to wait on pulmonary rehabilitation because of cost. GERD is controlled by omeprazole Says she can't survive without Provigil, otherwise  she predicts she will fall asleep any time she sits down.                   ROS-see HPI Constitutional:   No-   weight loss, night sweats, fevers, chills, +fatigue, lassitude. HEENT:   No-  headaches, difficulty swallowing, tooth/dental problems, sore throat,       No-  sneezing, itching, ear ache, nasal congestion, +post nasal drip,  CV:  No-   chest pain, orthopnea, PND, swelling in lower extremities, anasarca, dizziness, palpitations Resp: + shortness of breath with exertion or at rest.               productive cough,  + non-productive cough,  No- coughing up of blood.               change in color of mucus.  + wheezing.   Skin: No-   rash or lesions. GI:  No-   heartburn, indigestion, abdominal pain, nausea, vomiting,  GU:  MS:  No-   joint pain or swelling.   Neuro-     nothing unusual Psych:  No- change in mood or affect. No depression or anxiety.  No memory loss.  OBJ- Physical Exam   General- Alert, Oriented, Affect-appropriate,   Skin- + incidental fine papular excoriated rash upper abdomen developed after heat outdoors, sweating over weekend, consistent with intertrigo. Lymphadenopathy- none Head- atraumatic            Eyes- Gross vision intact, PERRLA, conjunctivae and secretions clear            Ears- Hearing,  canals-normal            Nose- clear, no-Septal dev, mucus, polyps, erosion, perforation             Throat- Mallampati II , mucosa clear/ not red , drainage- none, tonsils- atrophic Neck- flexible , trachea midline, no stridor , thyroid nl, carotid no bruit Chest - symmetrical excursion , unlabored           Heart/CV- RRR , no murmur , no gallop  , no rub, nl s1 s2                           - JVD- none , edema- none, stasis changes- none, varices- none  Lung-  wheeze -none, cough -none , dullness-none, rub- none,                                + distant breath sounds           Chest wall-  Abd-  Br/ Gen/ Rectal- Not done, not indicated Extrem- cyanosis- none, clubbing, none, atrophy- none, strength- nl Neuro- grossly intact to observation

## 2015-07-23 NOTE — Assessment & Plan Note (Signed)
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 Either way there has been a component of depression which is now doing better. Plan-continue Provigil with discussion

## 2015-07-23 NOTE — Assessment & Plan Note (Signed)
Continues dependent on oxygen for sleep and at least sometimes during the day. We discussed expectations and saturation goal.

## 2015-07-23 NOTE — Assessment & Plan Note (Signed)
Increased nonspecific postnasal drainage. Suggested trial of Flonase and can compare that with other OTC agents.

## 2015-07-23 NOTE — Assessment & Plan Note (Signed)
Continues in remission with encouragement

## 2015-07-27 ENCOUNTER — Other Ambulatory Visit: Payer: Self-pay | Admitting: Internal Medicine

## 2015-07-28 ENCOUNTER — Other Ambulatory Visit: Payer: Self-pay | Admitting: *Deleted

## 2015-07-28 MED ORDER — MODAFINIL 200 MG PO TABS: 200.0000 mg | ORAL_TABLET | Freq: Every day | ORAL | Status: DC

## 2015-08-11 ENCOUNTER — Telehealth: Payer: Self-pay | Admitting: Internal Medicine

## 2015-08-11 MED ORDER — PREDNISONE 10 MG PO TABS: ORAL_TABLET | ORAL | 0 refills | Status: DC

## 2015-08-11 MED ORDER — BENZONATATE 200 MG PO CAPS: 200.0000 mg | ORAL_CAPSULE | Freq: Three times a day (TID) | ORAL | 0 refills | Status: DC | PRN

## 2015-08-11 MED ORDER — AZITHROMYCIN 250 MG PO TABS: ORAL_TABLET | ORAL | 0 refills | Status: DC

## 2015-08-11 NOTE — Telephone Encounter (Signed)
LMOM TCB x2

## 2015-08-11 NOTE — Telephone Encounter (Signed)
Called and spoke with pt and she is aware of CY recs.  meds have been sent to her pharmacy and nothing further is needed.

## 2015-08-11 NOTE — Telephone Encounter (Signed)
I'm sorry she feels bad. Offer Z pak          Prednisone 10 mg, # 20, 4 X 2 DAYS, 3 X 2 DAYS, 2 X 2 DAYS, 1 X 2 DAYS            Benzonatate perles 200 mg, # 30, 1 every 8 hours if needed for cough

## 2015-08-11 NOTE — Telephone Encounter (Signed)
[phone], pt cb

## 2015-08-11 NOTE — Telephone Encounter (Signed)
Called and spoke with pt and she stated that her "allergies" turned into a very bad cold.  She is now having a chronic cough with green sputum, she feels terrible.  She is using her nebs and this helps some.  She is requesting abx, prednisone if he feels the need and would like to have the cough pills that CY has given her in the past.  CY please advise. Thanks  Last ov--07/23/15 Next ov--10/26/2015  Allergies  Allergen Reactions  . Biaxin [Clarithromycin]     Rash   . Codeine   . Hydrocodone-Homatropine     paranoia  . Penicillins   . Topiramate     Other reaction(s): WEIGHT LOSS

## 2015-08-11 NOTE — Telephone Encounter (Signed)
lmomtcb x1 

## 2015-08-19 ENCOUNTER — Encounter: Payer: Self-pay | Admitting: Internal Medicine

## 2015-08-20 ENCOUNTER — Telehealth: Payer: Self-pay | Admitting: Internal Medicine

## 2015-08-20 MED ORDER — TRAMADOL HCL 50 MG PO TABS: 50.0000 mg | ORAL_TABLET | Freq: Three times a day (TID) | ORAL | 0 refills | Status: DC | PRN

## 2015-08-20 MED ORDER — DOXYCYCLINE HYCLATE 100 MG PO TABS: ORAL_TABLET | ORAL | 0 refills | Status: DC

## 2015-08-20 NOTE — Telephone Encounter (Signed)
Offer doxycycline    100 mg, # 14, 1 twice daily  Suggest Mucinex-DM  I will work her in tomorrow morning if she would like to be seen, since weekend is coming.

## 2015-08-20 NOTE — Telephone Encounter (Signed)
Called and spoke to pt. Informed her of the recs per CY. Pt verbalized understanding and also requested Tramadol rx. Spoke with CY - give Tramadol 50mg  q8h prn #30 with 0 refills. Rx called into preferred pharmacy. Pt verbalized understanding and denied any further questions or concerns at this time.

## 2015-08-20 NOTE — Telephone Encounter (Signed)
Pt states she has prod cough with green mucus, nasal drainage, increased fatigued, wheezing, needing 02 more then usual. Prednisone taper, z pack & tessalone was called in 08/11/15. She finished z pack on Saturday and prednisone on Sunday. Pt states cough has slightly improved. She is needing her 02 more then usual.   CY please advise.   Allergies  Allergen Reactions  . Biaxin [Clarithromycin]     Rash   . Codeine   . Hydrocodone-Homatropine     paranoia  . Penicillins   . Topiramate     Other reaction(s): WEIGHT LOSS   Current Outpatient Prescriptions on File Prior to Visit  Medication Sig Dispense Refill  . albuterol (PROVENTIL HFA;VENTOLIN HFA) 108 (90 Base) MCG/ACT inhaler Inhale 2 puffs into the lungs every 4 (four) hours as needed for wheezing. 1 Inhaler prn  . atorvastatin (LIPITOR) 20 MG tablet Take 1 tablet (20 mg total) by mouth daily. 30 tablet 6  . azithromycin (ZITHROMAX) 250 MG tablet Take as directed by package 6 tablet 0  . benzonatate (TESSALON) 200 MG capsule Take 1 capsule (200 mg total) by mouth every 8 (eight) hours as needed for cough. 30 capsule 0  . buPROPion (WELLBUTRIN XL) 150 MG 24 hr tablet Take 1 tablet (150 mg total) by mouth daily. 30 tablet 6  . escitalopram (LEXAPRO) 20 MG tablet Take 1 tablet (20 mg total) by mouth daily. 30 tablet 3  . estradiol (VIVELLE-DOT) 0.1 MG/24HR patch APPLY 1 PATCH TO SKIN TWICE WEEKLY AS DIRECTED    . fexofenadine (ALLEGRA) 180 MG tablet TAKE 1 TABLET BY MOUTH DAILY 30 tablet 3  . ipratropium-albuterol (DUONEB) 0.5-2.5 (3) MG/3ML SOLN Take 3 mLs by nebulization every 6 (six) hours as needed. 360 mL 5  . losartan-hydrochlorothiazide (HYZAAR) 100-25 MG tablet Take 1 tablet by mouth daily. 30 tablet 6  . modafinil (PROVIGIL) 200 MG tablet Take 1 tablet (200 mg total) by mouth daily. 30 tablet 5  . montelukast (SINGULAIR) 10 MG tablet Take 1 tablet (10 mg total) by mouth at bedtime. 30 tablet prn  . omeprazole (PRILOSEC) 40 MG  capsule TAKE 1 CAPSULE BY MOUTH ONCE DAILY. 30 capsule 6  . potassium chloride SA (K-DUR,KLOR-CON) 20 MEQ tablet Take 1 tablet (20 mEq total) by mouth daily. 30 tablet 6  . predniSONE (DELTASONE) 10 MG tablet Take 4 tabs x 2 days, 3 tabs x 2 days, 2 tabs x 2 days, 1 tab x 2 days then stop. 20 tablet 0  . Respiratory Therapy Supplies (FLUTTER) DEVI Use as directed 1 each 0  . SPIRIVA HANDIHALER 18 MCG inhalation capsule INHALE THE CONTENTS OF 1 CAPSULE ONCE DAILY AS DIRECTED. 30 capsule 5  . SUMAtriptan (IMITREX) 100 MG tablet Reported on 03/03/2015    . SYMBICORT 160-4.5 MCG/ACT inhaler USE 2 PUFFS TWICE DAILY 10.2 g 5  . traMADol (ULTRAM) 50 MG tablet Take 1 tablet (50 mg total) by mouth every 6 (six) hours as needed. 40 tablet 1  . zolmitriptan (ZOMIG) 5 MG nasal solution Take as directed as needed for migraines      No current facility-administered medications on file prior to visit.

## 2015-08-25 ENCOUNTER — Other Ambulatory Visit: Payer: Self-pay | Admitting: Internal Medicine

## 2015-08-26 ENCOUNTER — Telehealth: Payer: Self-pay | Admitting: Internal Medicine

## 2015-08-26 NOTE — Telephone Encounter (Signed)
Patient states she is very congested, SOB, coughing up yellow/green mucus.  Wanted to be seen by Dr. Annamaria Boots only.  Advised her that Dr. Annamaria Boots has OV tomorrow available.  Scheduled her to see Dr. Annamaria Boots tomorrow at 1:30pm.    Nothing further needed.

## 2015-08-27 ENCOUNTER — Ambulatory Visit (INDEPENDENT_AMBULATORY_CARE_PROVIDER_SITE_OTHER): Payer: 59 | Admitting: Internal Medicine

## 2015-08-27 ENCOUNTER — Encounter: Payer: Self-pay | Admitting: Internal Medicine

## 2015-08-27 VITALS — BP 118/76 | HR 82 | Ht 62.0 in | Wt 125.6 lb

## 2015-08-27 DIAGNOSIS — J441 Chronic obstructive pulmonary disease with (acute) exacerbation: Secondary | ICD-10-CM | POA: Diagnosis not present

## 2015-08-27 DIAGNOSIS — Z720 Tobacco use: Secondary | ICD-10-CM

## 2015-08-27 MED ORDER — PREDNISONE 10 MG PO TABS: ORAL_TABLET | ORAL | 0 refills | Status: DC

## 2015-08-27 MED ORDER — ARFORMOTEROL TARTRATE 15 MCG/2ML IN NEBU: 15.0000 ug | INHALATION_SOLUTION | Freq: Two times a day (BID) | RESPIRATORY_TRACT | Status: DC

## 2015-08-27 MED ORDER — METHYLPREDNISOLONE ACETATE 80 MG/ML IJ SUSP
80.0000 mg | Freq: Once | INTRAMUSCULAR | Status: AC
  Administered 2015-08-27: 80 mg via INTRAMUSCULAR

## 2015-08-27 MED ORDER — ARFORMOTEROL TARTRATE 15 MCG/2ML IN NEBU
15.0000 ug | INHALATION_SOLUTION | Freq: Once | RESPIRATORY_TRACT | Status: AC
Start: 2015-08-27 — End: 2015-08-27
  Administered 2015-08-27: 15 ug via RESPIRATORY_TRACT

## 2015-08-27 NOTE — Patient Instructions (Signed)
Script sent for prednisone taper  Depo 80      Dx COPD exacerbation  Neb here Brovana, with 3-4 samples to use at home every 8-12 hours

## 2015-08-27 NOTE — Progress Notes (Signed)
Patient ID: Carol Quinn, female    DOB: 24-May-1955, 60 y.o.   MRN: [id number]  HPI F former smoker followed for COPD and hypersomnia. Walk Test Room Air 03/05/2015-desaturated to 88% a1AT 04/19/10- MM nl CXR 05/17/13- mild hyperV, NAD PFT-07/18/2014-severe obstructive airways disease with response to bronchodilator. FEV1 1.21/48% (+32%), FEV1/FVC 0.51, TLC 95%, DLCO 35%  03/05/2015-60 year old female smoker followed for COPD Gold III-IV, idiopathic hypersomnia/MSLT 4 .1, complicated by depression O2 2 L/sleep/APS  Started 02/11/15 based on overnight oximetry ACUTE VISIT: Pt is having hard time breathing; Increased SOB and tight in chest. Seen PCP yesterday and was given Doxycycline. Worse in the morning; husband states its hard to understand patient when on phone each day due to breathing so bad-getting worse in past several months. Has not been smoking this week due to not feeling well. Loose stools since yesterday as well and drinking lots of water-which is unusal for her. Here with husband Began with bad sore throat, increased SOB esp w exertion. Doxy started yesterday> loose stools started a day before. No N/V Cough remains dry.  Nasal Flu Swab-negative today Walk Test Room Air 03/05/2015-desaturated to 88%  03/23/2015-60 year old female smoker followed for COPD GOLD III-IV, idiopathic hypersomnia/MSL T4 0.1, complicated by depression O2 2 L sleep and exertion/APS CXR 03/05/2015-I reviewed x-ray images with her IMPRESSION: No active lung disease. No change in hyper aeration Electronically Signed  By: Ivar Drape M.D.  On: 03/05/2015 16:48 FOLLOWS FOR: Pt states she is not feeling any better and would like to discuss FMLA and disability.  Pt continues to wheeze. She is trying to stop smoking and has about smoke in the past week. Asks for Chantix which helped before. She has had more frequent exacerbations of her COPD in the past year and has been acutely ill, not feeling that she  can work, about every 3 months. She has home- oxygen and handicapped parking. She has discussed disability with her employer and feels that she cannot go on like this without changes.  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 Pt states she has noticed her body gasping for air-never had this happen before-just started in past 2 weeks. Pt takes her POC with her everywhere to use if needed. Pt needs this OV for disability.  Pt is coughing up yellow/brown, thick phelgm since Thursday last week. She is learning to manage her oxygen. Cutting back on routine errands so that she doesn't do as much outside of the house. We discussed possibility of pulmonary rehabilitation. She is limited more by dyspnea but than by deconditioning. She manages her daytime sleepiness with Provigil and naps.  06/01/2015-60 year old female smoker followed for COPD GOLD III-IV, chronic hypoxic respiratory failure, idiopathic hypersomnia/  MSLT 4.1 minutes, complicated by depression O2 2 L sleep/exertion/APS FOLLOWS FOR: Pt contines to wear O2 at 2lpm through APS. Pt contines to have trouble with breathing with exertion. She describes very easy dyspnea on exertion if she doesn't wear oxygen, such as while cooking. Husband is doing all of the heavy housework now. In the kitchen she has to sit down periodically to rest 2 or 3 times for preparing a meal. Husband helps her at the grocery store. Exhausted taking shower, has to rest before dressing and then has to rest again for moving on. She reports that Sr., nurse, visited and commented that off oxygen, her  lips looked cyanotic. Using nebulizer about once daily, rescue inhaler about once daily. Continues other meds. Daily Provigil helps her control daytime sleepiness. Without it feels extremely sleepy feels weak with  prolonged standing but does not describe cataplexy or sleep paralysis She is at the end of her short-term disability. Her job is sedentary, but she says she is not physically able to get ready in the morning, into her office and at work on time because of need to stop and rest repeatedly. She doesn't feel she can do that work any longer. Daily productive cough white or slightly yellow sputum. No blood, no anginal pain, no palpitation. Nearly complete smoking cessation.  07/23/2015-60 year old female smoker followed for COPD Gold III-IV, chronic hypoxic respiratory failure, idiopathic hypersomnia- MSLT 4.1 minutes complicated by depression O2 2 L sleep and as needed/APS FOLLOWS FOR: Pt has started using nebulizer more often with this heat and productive cough-brown/green tinge in color. ? infection Increased sense of chest congestion and postnasal drip associated with hot weather. No fever or sore throat. Oxygen definitely helps and has markedly reduced morning headache. Using rescue inhaler 0-6 times daily-discussed. Mostly uses more effective outdoors. Some jitter with this medicine.              Bevespi sample did not seem to work as well as Symbicort plus Spiriva. She is satisfied with her meds. Wants to wait on pulmonary rehabilitation because of cost. GERD is controlled by omeprazole Says she can't survive without Provigil, otherwise  she predicts she will fall asleep any time she sits down.          08/27/2015-60 year old female smoker followed for COPD Gold III-IV, chronic hypoxic respiratory failure, idiopathic hypersomnia-MSL T4 0.1 minutes, complicated by depression O2 2 L sleep and as needed/APS  ACUTE VISIT: Pt states she has been sick since 07-29-15 and has had two different abx's-not better. Cough, congestion, SOB, and wheezing that is getting worse.  We gave prednisone taper, benzonatate Perles and Z-Pak on 8/1, doxycycline on 8/10. Still productive cough but sputum is now white.  Finish doxycycline today after previous Z-Pak. Using oxygen more in the daytime now. Flutter device does help.  ROS-see HPI Constitutional:   No-   weight loss, night sweats, fevers, chills, +fatigue, lassitude. HEENT:   No-  headaches, difficulty swallowing, tooth/dental problems, sore throat,       No-  sneezing, itching, ear ache, nasal congestion, +post nasal drip,  CV:  No-   chest pain, orthopnea, PND, swelling in lower extremities, anasarca, dizziness, palpitations Resp: + shortness of breath with exertion or at rest.               + productive cough,  + non-productive cough,  No- coughing up of blood.               change in color of mucus.  + wheezing.   Skin: No-   rash or lesions. GI:  No-   heartburn, indigestion, abdominal pain, nausea, vomiting,  GU:  MS:  No-   joint pain or swelling.   Neuro-     nothing unusual Psych:  No- change in mood or affect. No depression or anxiety.  No memory loss.  OBJ- Physical Exam   General- Alert, Oriented, Affect-appropriate,   Skin- + incidental fine papular excoriated rash upper abdomen developed after heat outdoors, sweating over weekend, consistent with intertrigo. Lymphadenopathy- none Head- atraumatic            Eyes- Gross vision  intact, PERRLA, conjunctivae and secretions clear            Ears- Hearing, canals-normal            Nose- clear, no-Septal dev, mucus, polyps, erosion, perforation             Throat- Mallampati II , mucosa clear/ not red , drainage- none, tonsils- atrophic Neck- flexible , trachea midline, no stridor , thyroid nl, carotid no bruit Chest - symmetrical excursion , unlabored           Heart/CV- RRR , no murmur , no gallop  , no rub, nl s1 s2                           - JVD- none , edema- none, stasis changes- none, varices- none           Lung-  wheeze -none, cough + , dullness-none, rub- none,                                + distant breath sounds           Chest wall-  Abd-  Br/ Gen/ Rectal- Not  done, not indicated Extrem- cyanosis- none, clubbing, none, atrophy- none, strength- nl Neuro- grossly intact to observation

## 2015-08-29 ENCOUNTER — Encounter: Payer: Self-pay | Admitting: Internal Medicine

## 2015-08-29 NOTE — Assessment & Plan Note (Signed)
She still smokes a few cigarettes some days. Counseling efforts continue. I think she can gradually quit.

## 2015-08-29 NOTE — Assessment & Plan Note (Signed)
Sustained exacerbation, no longer purulent. I don't think or antibiotics will help right now. She might become a candidate to add theophylline or Daliresp Plan-Depo-Medrol, nebulizer treatment here and a couple of sample ampules of Brovana. Prednisone taper with steroid talk

## 2015-09-18 ENCOUNTER — Telehealth: Payer: Self-pay | Admitting: Internal Medicine

## 2015-09-18 NOTE — Telephone Encounter (Signed)
Packet given to [facility] to complete.

## 2015-09-18 NOTE — Telephone Encounter (Signed)
Done

## 2015-09-21 NOTE — Telephone Encounter (Signed)
Rec'd completed disability forms from [facility]. Sent to [employer] via interoffice maile - 09/21/15-pr

## 2015-09-22 ENCOUNTER — Telehealth: Payer: Self-pay | Admitting: Internal Medicine

## 2015-09-22 NOTE — Telephone Encounter (Signed)
Rec'd disability form from [facility] via fax. Sending form to [employer] via interoffice mail today. I called patient to inform her that form was received. - pr

## 2015-10-14 ENCOUNTER — Telehealth: Payer: Self-pay | Admitting: Family Medicine

## 2015-10-14 DIAGNOSIS — C6930 Malignant neoplasm of unspecified choroid: Secondary | ICD-10-CM

## 2015-10-14 NOTE — Telephone Encounter (Signed)
Spoke with pt, she is scheduled for 2:15pm on 10/15/15. These imaging orders were placed as STAT as pt has an appt with [facility] provider next week and they would like to have these pictures at that time.

## 2015-10-14 NOTE — Telephone Encounter (Signed)
Pt calling stating that she saw Dr. Rex Kras w/ [facility] which referred her to [place]. Pt has been diagnosis with eye cancer and was told that someone from [facility] would be calling [facility] today for pt to have further testing done. I let her know that I would get this message to [facility] and that we would do all we needed to to help her.

## 2015-10-14 NOTE — Telephone Encounter (Signed)
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  He is asking that pt have CXR, CT Chest/Abd w/ contrast, CBC w/ diff, BMP, LFTs.  He would like [place] to schedule her for a visit 'for an exam' sometime in the next week.  Please reach out to pt to schedule an appt to see me so we can get labs and please order the imaging studies so we can get those started.

## 2015-10-15 ENCOUNTER — Ambulatory Visit: Payer: 59 | Admitting: Family Medicine

## 2015-10-15 ENCOUNTER — Encounter: Payer: Self-pay | Admitting: Family Medicine

## 2015-10-15 ENCOUNTER — Ambulatory Visit (INDEPENDENT_AMBULATORY_CARE_PROVIDER_SITE_OTHER): Payer: 59 | Admitting: Family Medicine

## 2015-10-15 DIAGNOSIS — C6931 Malignant neoplasm of right choroid: Secondary | ICD-10-CM | POA: Insufficient documentation

## 2015-10-15 DIAGNOSIS — C6930 Malignant neoplasm of unspecified choroid: Secondary | ICD-10-CM

## 2015-10-15 LAB — CBC WITH DIFFERENTIAL/PLATELET
BASOS PCT: 1 %
Basophils Absolute: 78 cells/uL (ref 0–200)
EOS ABS: 156 {cells}/uL (ref 15–500)
Eosinophils Relative: 2 %
HEMATOCRIT: 42.6 % (ref 35.0–45.0)
HEMOGLOBIN: 14.1 g/dL (ref 11.7–15.5)
LYMPHS ABS: 2028 {cells}/uL (ref 850–3900)
LYMPHS PCT: 26 %
MCH: 32.6 pg (ref 27.0–33.0)
MCHC: 33.1 g/dL (ref 32.0–36.0)
MCV: 98.4 fL (ref 80.0–100.0)
MONO ABS: 780 {cells}/uL (ref 200–950)
MPV: 8.7 fL (ref 7.5–12.5)
Monocytes Relative: 10 %
NEUTROS PCT: 61 %
Neutro Abs: 4758 cells/uL (ref 1500–7800)
Platelets: 330 10*3/uL (ref 140–400)
RBC: 4.33 MIL/uL (ref 3.80–5.10)
RDW: 13 % (ref 11.0–15.0)
WBC: 7.8 10*3/uL (ref 3.8–10.8)

## 2015-10-15 LAB — BASIC METABOLIC PANEL
BUN: 16 mg/dL (ref 7–25)
CHLORIDE: 104 mmol/L (ref 98–110)
CO2: 26 mmol/L (ref 20–31)
CREATININE: 0.58 mg/dL (ref 0.50–1.05)
Calcium: 9.6 mg/dL (ref 8.6–10.4)
GLUCOSE: 88 mg/dL (ref 65–99)
Potassium: 4.4 mmol/L (ref 3.5–5.3)
Sodium: 143 mmol/L (ref 135–146)

## 2015-10-15 LAB — HEPATIC FUNCTION PANEL
ALT: 20 U/L (ref 6–29)
AST: 15 U/L (ref 10–35)
Albumin: 4.1 g/dL (ref 3.6–5.1)
Alkaline Phosphatase: 81 U/L (ref 33–130)
BILIRUBIN INDIRECT: 0.2 mg/dL (ref 0.2–1.2)
Bilirubin, Direct: 0.1 mg/dL (ref <=0.2)
TOTAL PROTEIN: 5.9 g/dL — AB (ref 6.1–8.1)
Total Bilirubin: 0.3 mg/dL (ref 0.2–1.2)

## 2015-10-15 MED ORDER — SUMATRIPTAN SUCCINATE 100 MG PO TABS: 100.0000 mg | ORAL_TABLET | ORAL | 6 refills | Status: DC | PRN

## 2015-10-15 NOTE — Progress Notes (Signed)
   Subjective:    Patient ID: Carol Quinn, female    DOB: 1955/08/06, 60 y.o.   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  Pt reports vision problem developed on Sunday.  'a little bit of my vision is cut off' in medial upper corner of R eye.  Pt went to eye doctor in [place] on Tuesday and was immediately sent to [facility] for evaluation.  Pt was told she had rare melanoma in eye and that she would likely have cancer in lung and liver.  I did get phone call yesterday asking to get CT imaging of chest and abd, CXR, and labs.     Review of Systems For ROS see HPI     Objective:   Physical Exam  Constitutional: She is oriented to person, place, and time. She appears well-developed and well-nourished. No distress.  HENT:  Head: Normocephalic and atraumatic.  Eyes: Conjunctivae and EOM are normal. Pupils are equal, round, and reactive to light.  Neck: Normal range of motion. Neck supple. No thyromegaly present.  Cardiovascular: Normal rate, regular rhythm, normal heart sounds and intact distal pulses.   No murmur heard. Pulmonary/Chest: Effort normal and breath sounds normal. No respiratory distress.  Abdominal: Soft. She exhibits no distension and no mass. There is no tenderness. There is no rebound.  Musculoskeletal: She exhibits no edema.  Lymphadenopathy:    She has no cervical adenopathy.  Neurological: She is alert and oriented to person, place, and time.  Skin: Skin is warm and dry.  Psychiatric: She has a normal mood and affect. Her behavior is normal.  Vitals reviewed.         Assessment & Plan:

## 2015-10-15 NOTE — Assessment & Plan Note (Signed)
New.  Pt was dx'd earlier this week when she developed visual field deficit.  Pt had normal CXR earlier this year and normal LFTs in June.  Pt is to get labs today and CT imaging tomorrow in attempts to stage.  PE today WNL.  Pt has a lot of questions and is very confused and appropriately concerned.  Attempted to answer her questions to the best of my ability and encouraged her not to look to place blame but focus on moving forward and proceeding w/ treatment.  Pt expressed understanding and is in agreement w/ plan.

## 2015-10-15 NOTE — Progress Notes (Signed)
Pre visit review using our clinic review tool, if applicable. No additional management support is needed unless otherwise documented below in the visit note. 

## 2015-10-15 NOTE — Patient Instructions (Signed)
Follow up as scheduled We'll notify you of your lab results and make any changes if needed We'll notify you of your imaging results once they're available Hopefully we will get you some answers!!! Call with any questions or concerns Hang in there!!!

## 2015-10-16 ENCOUNTER — Other Ambulatory Visit: Payer: Self-pay | Admitting: General Practice

## 2015-10-16 ENCOUNTER — Ambulatory Visit (HOSPITAL_COMMUNITY)
Admission: RE | Admit: 2015-10-16 | Discharge: 2015-10-16 | Disposition: A | Discharge status: Home / Self Care | Payer: 59 | Source: Ambulatory Visit | Attending: Family Medicine | Admitting: Family Medicine

## 2015-10-16 DIAGNOSIS — M5136 Other intervertebral disc degeneration, lumbar region: Secondary | ICD-10-CM | POA: Insufficient documentation

## 2015-10-16 DIAGNOSIS — K7689 Other specified diseases of liver: Secondary | ICD-10-CM | POA: Insufficient documentation

## 2015-10-16 DIAGNOSIS — J439 Emphysema, unspecified: Secondary | ICD-10-CM | POA: Diagnosis not present

## 2015-10-16 DIAGNOSIS — I700 Atherosclerosis of aorta: Secondary | ICD-10-CM | POA: Diagnosis not present

## 2015-10-16 DIAGNOSIS — C6930 Malignant neoplasm of unspecified choroid: Secondary | ICD-10-CM

## 2015-10-16 MED ORDER — IOPAMIDOL (ISOVUE-300) INJECTION 61%
100.0000 mL | Freq: Once | INTRAVENOUS | Status: AC | PRN
  Administered 2015-10-16: 100 mL via INTRAVENOUS

## 2015-10-16 MED ORDER — SUMATRIPTAN SUCCINATE 100 MG PO TABS
ORAL_TABLET | ORAL | 6 refills | Status: DC
Start: 2015-10-16 — End: 2019-07-10

## 2015-10-23 DIAGNOSIS — D3132 Benign neoplasm of left choroid: Secondary | ICD-10-CM | POA: Insufficient documentation

## 2015-10-26 ENCOUNTER — Encounter: Payer: Self-pay | Admitting: Internal Medicine

## 2015-10-26 ENCOUNTER — Ambulatory Visit (INDEPENDENT_AMBULATORY_CARE_PROVIDER_SITE_OTHER): Payer: 59 | Admitting: Internal Medicine

## 2015-10-26 DIAGNOSIS — C6931 Malignant neoplasm of right choroid: Secondary | ICD-10-CM

## 2015-10-26 DIAGNOSIS — J449 Chronic obstructive pulmonary disease, unspecified: Secondary | ICD-10-CM

## 2015-10-26 DIAGNOSIS — J9611 Chronic respiratory failure with hypoxia: Secondary | ICD-10-CM

## 2015-10-26 DIAGNOSIS — Z720 Tobacco use: Secondary | ICD-10-CM | POA: Diagnosis not present

## 2015-10-26 DIAGNOSIS — I700 Atherosclerosis of aorta: Secondary | ICD-10-CM | POA: Insufficient documentation

## 2015-10-26 MED ORDER — TRAMADOL HCL 50 MG PO TABS: 50.0000 mg | ORAL_TABLET | Freq: Three times a day (TID) | ORAL | 5 refills | Status: DC | PRN

## 2015-10-26 MED ORDER — VARENICLINE TARTRATE 0.5 MG X 11 & 1 MG X 42 PO MISC: ORAL | 0 refills | Status: DC

## 2015-10-26 NOTE — Assessment & Plan Note (Signed)
She needs to continue present meds and stop smoking but is currently stable for anticipated anesthesia and surgery. I emphasized the importance of avoiding people with colds and respiratory infections especially before surgery.

## 2015-10-26 NOTE — Assessment & Plan Note (Signed)
Anticipates surgery for placement of radiation source October 30.

## 2015-10-26 NOTE — Progress Notes (Signed)
Patient ID: Carol Quinn, female    DOB: 1955-11-14, 60 y.o.   MRN: [id number]  HPI F former smoker followed for COPD and hypersomnia. Walk Test Room Air 03/05/2015-desaturated to 88% a1AT 04/19/10- MM nl CXR 05/17/13- mild hyperV, NAD PFT-07/18/2014-severe obstructive airways disease with response to bronchodilator. FEV1 1.21/48% (+32%), FEV1/FVC 0.51, TLC 95%, DLCO 35% Walk Test Room Air 03/05/2015-desaturated to 88%   07/23/2015-60 year old female smoker followed for COPD Gold III-IV, chronic hypoxic respiratory failure, idiopathic hypersomnia- MSLT 4.1 minutes complicated by depression O2 2 L sleep and as needed/APS FOLLOWS FOR: Pt has started using nebulizer more often with this heat and productive cough-brown/green tinge in color. ? infection Increased sense of chest congestion and postnasal drip associated with hot weather. No fever or sore throat. Oxygen definitely helps and has markedly reduced morning headache. Using rescue inhaler 0-6 times daily-discussed. Mostly uses more effective outdoors. Some jitter with this medicine.              Bevespi sample did not seem to work as well as Symbicort plus Spiriva. She is satisfied with her meds. Wants to wait on pulmonary rehabilitation because of cost. GERD is controlled by omeprazole Says she can't survive without Provigil, otherwise  she predicts she will fall asleep any time she sits down.          08/27/2015-60 year old female smoker followed for COPD Gold III-IV, chronic hypoxic respiratory failure, idiopathic hypersomnia-MSL T4 0.1 minutes, complicated by depression O2 2 L sleep and as needed/APS  ACUTE VISIT: Pt states she has been sick since 07-29-15 and has had two different abx's-not better. Cough, congestion, SOB, and wheezing that is getting worse.  We gave prednisone taper, benzonatate Perles and Z-Pak on 8/1, doxycycline on 8/10. Still productive cough but sputum is now white. Finish doxycycline today after previous Z-Pak.  Using oxygen more in the daytime now. Flutter device does help.  10/26/2015-60 year old female smoker followed for COPD GOLD III-IV, chronic hypoxic respiratory failure, idiopathic hypersomnia- MSLT 4.1 minutes, complicated by depression, occular myeloma O2 2 L sleep and as needed/APS                    Husband here FOLLOWS FOR:Pt recently dx'd with  Melanoma R eye-pt has been scheduled 11-09-15 for surgery at [facility](care everywhere) and cleared by Dr Birdie Riddle with CXR and labs for surgery. Past few days having a cough-productive-gray in color. Wants to discuss prior to surgery if there is anything to do or take to keep herself well. Still smoking but accepts prescription for Chantix to start as she leaves hospital after planned surgery. Breathing has been stable with occasional cough, clear phlegm, occasional wheeze. Easy dyspnea with ADLs. Asks refill tramadol for cough Reports flu shot at primary office last week. CT chest 10/16/2015 IMPRESSION: 1. No evidence of metastatic disease within the chest or abdomen. 2. No acute findings. 3. Advanced emphysema. Apical pleural parenchymal scarring in the lungs. 4. 3.9 cm liver cyst. 5. Significant aortic atherosclerosis most evident in the abdomen. No aneurysm. 6. Disk degenerative changes with significant reactive endplate sclerosis at [id number]. Electronically Signed   By: Lajean Manes M.D.   On: 10/16/2015 16:22  ROS-see HPI Constitutional:   No-   weight loss, night sweats, fevers, chills, +fatigue, lassitude. HEENT:   No-  headaches, difficulty swallowing, tooth/dental problems, sore throat,       No-  sneezing, itching, ear ache, nasal congestion, +post nasal drip,  CV:  No-  chest pain, orthopnea, PND, swelling in lower extremities, anasarca, dizziness, palpitations Resp: + shortness of breath with exertion or at rest.               + productive cough,  + non-productive cough,  No- coughing up of blood.               change in color of  mucus.  + wheezing.   Skin: No-   rash or lesions. GI:  No-   heartburn, indigestion, abdominal pain, nausea, vomiting,  GU:  MS:  No-   joint pain or swelling.   Neuro-     nothing unusual Psych:  No- change in mood or affect. No depression or anxiety.  No memory loss.  OBJ- Physical Exam   General- Alert, Oriented, Affect-appropriate,   Skin- clear  Lymphadenopathy- none Head- atraumatic            Eyes- Gross vision intact/ glasses, PERRLA, conjunctivae and secretions clear            Ears- Hearing, canals-normal            Nose- clear, no-Septal dev, mucus, polyps, erosion, perforation             Throat- Mallampati II , mucosa clear/ not red , drainage- none, tonsils- atrophic Neck- flexible , trachea midline, no stridor , thyroid nl, carotid no bruit Chest - symmetrical excursion , unlabored           Heart/CV- RRR , no murmur , no gallop  , no rub, nl s1 s2                           - JVD- none , edema- none, stasis changes- none, varices- none           Lung-  wheeze + faint end expiratory, cough -none , dullness-none, rub- none,                                + distant breath sounds           Chest wall-  Abd-  Br/ Gen/ Rectal- Not done, not indicated Extrem- cyanosis- none, clubbing, none, atrophy- none, strength- nl Neuro- grossly intact to observation

## 2015-10-26 NOTE — Assessment & Plan Note (Signed)
I am disappointed that she drifted back to smoking. I offered Chantix and she accepted the prescription to start after surgery with encouragement. She continues Wellbutrin.

## 2015-10-26 NOTE — Patient Instructions (Signed)
Script printed for Chantix- please try not to smoke Santiago Glad- It's important  Script sent for Tramadol refill  You will do fine.  Please call if we can help

## 2015-10-26 NOTE — Assessment & Plan Note (Signed)
She continues to sleep with oxygen at 2 L and to use it most of the time around the house. No change required.

## 2015-10-27 ENCOUNTER — Other Ambulatory Visit: Payer: Self-pay | Admitting: Family Medicine

## 2015-11-03 ENCOUNTER — Ambulatory Visit: Payer: 59 | Admitting: Internal Medicine

## 2015-11-03 ENCOUNTER — Ambulatory Visit: Payer: 59 | Admitting: Family Medicine

## 2015-11-05 ENCOUNTER — Telehealth: Payer: Self-pay | Admitting: Internal Medicine

## 2015-11-05 MED ORDER — PREDNISONE 10 MG PO TABS: ORAL_TABLET | ORAL | 0 refills | Status: DC

## 2015-11-05 MED ORDER — DOXYCYCLINE HYCLATE 100 MG PO TABS: ORAL_TABLET | ORAL | 0 refills | Status: DC

## 2015-11-05 NOTE — Telephone Encounter (Signed)
Called and spoke to pt. Pt c/o increase in SOB, prod cough with yellow to green mucus and states her SOB and cough have worsened since being seen on 10.16.17. Pt denies CP/tightness and f/c/s. Pt is requesting recs from [facility].   Dr. Annamaria Boots please advise. Thanks.   Allergies  Allergen Reactions  . Biaxin [Clarithromycin]     Rash   . Codeine   . Hydrocodone-Homatropine     paranoia  . Penicillins   . Topiramate     Other reaction(s): WEIGHT LOSS    Current Outpatient Prescriptions on File Prior to Visit  Medication Sig Dispense Refill  . albuterol (PROVENTIL HFA;VENTOLIN HFA) 108 (90 Base) MCG/ACT inhaler Inhale 2 puffs into the lungs every 4 (four) hours as needed for wheezing. 1 Inhaler prn  . atorvastatin (LIPITOR) 20 MG tablet Take 1 tablet (20 mg total) by mouth daily. 30 tablet 6  . buPROPion (WELLBUTRIN XL) 150 MG 24 hr tablet Take 1 tablet (150 mg total) by mouth daily. 30 tablet 6  . escitalopram (LEXAPRO) 20 MG tablet TAKE 1 TABLET BY MOUTH DAILY 30 tablet 6  . estradiol (VIVELLE-DOT) 0.1 MG/24HR patch APPLY 1 PATCH TO SKIN TWICE WEEKLY AS DIRECTED    . fexofenadine (ALLEGRA) 180 MG tablet TAKE 1 TABLET BY MOUTH DAILY 30 tablet 3  . ipratropium-albuterol (DUONEB) 0.5-2.5 (3) MG/3ML SOLN Take 3 mLs by nebulization every 6 (six) hours as needed. 360 mL 5  . losartan-hydrochlorothiazide (HYZAAR) 100-25 MG tablet Take 1 tablet by mouth daily. 30 tablet 6  . modafinil (PROVIGIL) 200 MG tablet Take 1 tablet (200 mg total) by mouth daily. 30 tablet 5  . montelukast (SINGULAIR) 10 MG tablet Take 1 tablet (10 mg total) by mouth at bedtime. 30 tablet prn  . omeprazole (PRILOSEC) 40 MG capsule TAKE 1 CAPSULE BY MOUTH ONCE DAILY. 30 capsule 6  . potassium chloride SA (K-DUR,KLOR-CON) 20 MEQ tablet Take 1 tablet (20 mEq total) by mouth daily. 30 tablet 6  . Respiratory Therapy Supplies (FLUTTER) DEVI Use as directed 1 each 0  . SPIRIVA HANDIHALER 18 MCG inhalation capsule INHALE THE CONTENTS  OF 1 CAPSULE ONCE DAILY AS DIRECTED. 30 capsule 6  . SUMAtriptan (IMITREX) 100 MG tablet Take one tablet at onset of migraine, repeat in 2 hours if needed. 10 tablet 6  . SYMBICORT 160-4.5 MCG/ACT inhaler USE 2 PUFFS TWICE DAILY 10.2 g 5  . traMADol (ULTRAM) 50 MG tablet Take 1 tablet (50 mg total) by mouth every 8 (eight) hours as needed. 30 tablet 5  . varenicline (CHANTIX PAK) 0.5 MG X 11 & 1 MG X 42 tablet Take one 0.5 mg tablet by mouth once daily for 3 days, then increase to one 0.5 mg tablet twice daily for 4 days, then increase to one 1 mg tablet twice daily. 53 tablet 0  . zolmitriptan (ZOMIG) 5 MG nasal solution Take as directed as needed for migraines      No current facility-administered medications on file prior to visit.

## 2015-11-05 NOTE — Telephone Encounter (Signed)
RX sent to preferred pharmacy. Pt aware & voiced understanding. Nothing further needed.  

## 2015-11-05 NOTE — Telephone Encounter (Signed)
lmomtcb x1 

## 2015-11-05 NOTE — Telephone Encounter (Signed)
Offer doxycycline 100 mg, # 8, 2 today then one daily           Prednisone 10 mg, # 20, 4 X 2 DAYS, 3 X 2 DAYS, 2 X 2 DAYS, 1 X 2 DAYS If she needs anything else or isn't doing well, please let us know

## 2016-01-13 ENCOUNTER — Telehealth: Payer: Self-pay | Admitting: *Deleted

## 2016-01-13 DIAGNOSIS — C6931 Malignant neoplasm of right choroid: Secondary | ICD-10-CM

## 2016-01-13 NOTE — Addendum Note (Signed)
Addended by: Desmond Dike L on: 01/13/2016 03:00 PM   Modules accepted: Orders

## 2016-01-13 NOTE — Telephone Encounter (Signed)
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  She has been released but was told she needed to have her PCP send a referral to an oncologist so that they can keep an eye on her and follow her for the potential of cancer returning.   Patient is aware that I am forwarding the message to Dr. Birdie Riddle and that we will let her know the next step.

## 2016-01-13 NOTE — Telephone Encounter (Signed)
New referral placed.

## 2016-01-13 NOTE — Telephone Encounter (Signed)
[place] for referral to oncology.  We can specify English as a first language but there are no promises  (dx ocular melanoma)

## 2016-02-04 ENCOUNTER — Encounter: Payer: Self-pay | Admitting: Hematology & Oncology

## 2016-02-04 ENCOUNTER — Ambulatory Visit (HOSPITAL_BASED_OUTPATIENT_CLINIC_OR_DEPARTMENT_OTHER): Payer: 59 | Admitting: Hematology & Oncology

## 2016-02-04 ENCOUNTER — Other Ambulatory Visit (HOSPITAL_BASED_OUTPATIENT_CLINIC_OR_DEPARTMENT_OTHER): Payer: 59

## 2016-02-04 ENCOUNTER — Ambulatory Visit: Payer: 59

## 2016-02-04 VITALS — BP 115/69 | HR 92 | Temp 98.4°F | Resp 16 | Wt 131.4 lb

## 2016-02-04 DIAGNOSIS — Z87891 Personal history of nicotine dependence: Secondary | ICD-10-CM

## 2016-02-04 DIAGNOSIS — C6931 Malignant neoplasm of right choroid: Secondary | ICD-10-CM | POA: Diagnosis not present

## 2016-02-04 DIAGNOSIS — Z85840 Personal history of malignant neoplasm of eye: Secondary | ICD-10-CM

## 2016-02-04 LAB — CMP (CANCER CENTER ONLY)
ALT(SGPT): 28 U/L (ref 10–47)
AST: 22 U/L (ref 11–38)
Albumin: 4.3 g/dL (ref 3.3–5.5)
Alkaline Phosphatase: 113 U/L — ABNORMAL HIGH (ref 26–84)
BUN: 14 mg/dL (ref 7–22)
CHLORIDE: 90 meq/L — AB (ref 98–108)
CO2: 30 mEq/L (ref 18–33)
Calcium: 9.5 mg/dL (ref 8.0–10.3)
Creat: 0.7 mg/dl (ref 0.6–1.2)
GLUCOSE: 89 mg/dL (ref 73–118)
POTASSIUM: 3.3 meq/L (ref 3.3–4.7)
Sodium: 136 mEq/L (ref 128–145)
Total Bilirubin: 0.9 mg/dl (ref 0.20–1.60)
Total Protein: 7.1 g/dL (ref 6.4–8.1)

## 2016-02-04 LAB — CBC WITH DIFFERENTIAL (CANCER CENTER ONLY)
BASO#: 0.1 10*3/uL (ref 0.0–0.2)
BASO%: 1.3 % (ref 0.0–2.0)
EOS ABS: 0.2 10*3/uL (ref 0.0–0.5)
EOS%: 3.1 % (ref 0.0–7.0)
HCT: 41.8 % (ref 34.8–46.6)
HGB: 14.6 g/dL (ref 11.6–15.9)
LYMPH#: 1.8 10*3/uL (ref 0.9–3.3)
LYMPH%: 23.4 % (ref 14.0–48.0)
MCH: 34.2 pg — AB (ref 26.0–34.0)
MCHC: 34.9 g/dL (ref 32.0–36.0)
MCV: 98 fL (ref 81–101)
MONO#: 0.9 10*3/uL (ref 0.1–0.9)
MONO%: 11 % (ref 0.0–13.0)
NEUT#: 4.8 10*3/uL (ref 1.5–6.5)
NEUT%: 61.2 % (ref 39.6–80.0)
PLATELETS: 303 10*3/uL (ref 145–400)
RBC: 4.27 10*6/uL (ref 3.70–5.32)
RDW: 12.7 % (ref 11.1–15.7)
WBC: 7.8 10*3/uL (ref 3.9–10.0)

## 2016-02-04 LAB — LACTATE DEHYDROGENASE: LDH: 165 U/L (ref 125–245)

## 2016-02-09 NOTE — Progress Notes (Signed)
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 New Evaluation  : I had my right eye taken out because of cancer.  HPI: Carol Quinn is a very nice 61 year old white female. She's been in good health. She began to have some issues back in the fall of 2017. She has some problems with some blurred vision in her right eye. At first, she thought that she had an eyelash in the eye.  She went to see her eye doctor. It was apparent that there was a problem.  She then was referred to ophthalmology. She ultimately ended up at [facility]. She was found to have a melanoma in the right eye.  She subsequently underwent enucleation of the right eye. 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 It was a fairly large melanoma. The basal size was 23 mm. The tumor size after sectioning was 15 mm x 9 mm. The distance from the optic disc was 10 mm. It was in the ciliary body. It was a pT3b lesion.  She has recovered from her surgery. She has yet to get an ocular prosthetic.  She was [facility] so that we can proceed with follow-up.  She has had preop scans. She has CT scan of the chest and abdomen. There was a 3.5; liver cyst. She has advanced emphysema. She was a heavy smoker. There is no obvious metastatic disease.  She also had a specialized test on the tissue. This was a DecisionDx-UM test. This is some of that is fairly new. She was found to be class II. This indicates a risk of metastasis that is relatively high.   Currently, she feels pretty well. She comes in with her husband. She has really no other health issues outside of the emphysema. She is on oxygen. She, again, has a past history of heavy tobacco use. There is no history of diabetes.  SheIs eagerly awaiting the right ocular prosthetic.  She has had no fever. There's been no rashes. She's  had no swollen lymph nodes. She has occasional headache.  Overall, I was able performance status is ECOG 1.    Past Medical History:  Diagnosis Date  . Acute bronchitis   . Allergic rhinitis   . Childhood asthma   . Chronic headaches   . Complication of anesthesia    woke up during surgery x 1  . Complication of anesthesia    hard to wake up x 1  . COPD (chronic obstructive pulmonary disease) (HCC)    emphysema  . Exposure to TB    uncle-her PPD neg  . GERD (gastroesophageal reflux disease)   . Hypertension   . RBBB (right bundle branch block with left anterior fascicular block)   :  Past Surgical History:  Procedure Laterality Date  . BREAST BIOPSY  2002  . NOSE SURGERY     "nasal deviation"  . TOTAL ABDOMINAL HYSTERECTOMY    . TUBAL LIGATION    :   Current Outpatient Prescriptions:  .  albuterol (PROVENTIL HFA;VENTOLIN HFA) 108 (90 Base) MCG/ACT inhaler, Inhale 2 puffs into the lungs every 4 (four) hours as needed for wheezing., Disp: 1 Inhaler, Rfl: prn .  atorvastatin (LIPITOR) 20 MG tablet, Take 1 tablet (20 mg total) by mouth daily., Disp: 30 tablet, Rfl: 6 .  buPROPion (WELLBUTRIN XL) 150 MG 24 hr tablet, Take 1  tablet (150 mg total) by mouth daily., Disp: 30 tablet, Rfl: 6 .  escitalopram (LEXAPRO) 20 MG tablet, TAKE 1 TABLET BY MOUTH DAILY, Disp: 30 tablet, Rfl: 6 .  estradiol (VIVELLE-DOT) 0.1 MG/24HR patch, APPLY 1 PATCH TO SKIN TWICE WEEKLY AS DIRECTED, Disp: , Rfl:  .  fexofenadine (ALLEGRA) 180 MG tablet, TAKE 1 TABLET BY MOUTH DAILY, Disp: 30 tablet, Rfl: 3 .  ipratropium-albuterol (DUONEB) 0.5-2.5 (3) MG/3ML SOLN, Take 3 mLs by nebulization every 6 (six) hours as needed., Disp: 360 mL, Rfl: 5 .  losartan-hydrochlorothiazide (HYZAAR) 100-25 MG tablet, Take 1 tablet by mouth daily., Disp: 30 tablet, Rfl: 6 .  modafinil (PROVIGIL) 200 MG tablet, Take 1 tablet (200 mg total) by mouth daily., Disp: 30 tablet, Rfl: 5 .  montelukast (SINGULAIR) 10 MG tablet, Take  1 tablet (10 mg total) by mouth at bedtime., Disp: 30 tablet, Rfl: prn .  omeprazole (PRILOSEC) 40 MG capsule, TAKE 1 CAPSULE BY MOUTH ONCE DAILY., Disp: 30 capsule, Rfl: 6 .  potassium chloride SA (K-DUR,KLOR-CON) 20 MEQ tablet, Take 1 tablet (20 mEq total) by mouth daily., Disp: 30 tablet, Rfl: 6 .  predniSONE (DELTASONE) 10 MG tablet, 4 tabs X 2 days, 3 tabs X 2 days, 2 tabs X 2 days, 1 tab X 2 day then stop, Disp: 20 tablet, Rfl: 0 .  Respiratory Therapy Supplies (FLUTTER) DEVI, Use as directed, Disp: 1 each, Rfl: 0 .  SPIRIVA HANDIHALER 18 MCG inhalation capsule, INHALE THE CONTENTS OF 1 CAPSULE ONCE DAILY AS DIRECTED., Disp: 30 capsule, Rfl: 6 .  SUMAtriptan (IMITREX) 100 MG tablet, Take one tablet at onset of migraine, repeat in 2 hours if needed., Disp: 10 tablet, Rfl: 6 .  SYMBICORT 160-4.5 MCG/ACT inhaler, USE 2 PUFFS TWICE DAILY, Disp: 10.2 g, Rfl: 5 .  traMADol (ULTRAM) 50 MG tablet, Take 1 tablet (50 mg total) by mouth every 8 (eight) hours as needed., Disp: 30 tablet, Rfl: 5 .  zolmitriptan (ZOMIG) 5 MG nasal solution, Take as directed as needed for migraines , Disp: , Rfl: :  :  Allergies  Allergen Reactions  . Biaxin [Clarithromycin]     Rash   . Codeine   . Hydrocodone-Homatropine     paranoia  . Penicillins   . Topiramate     Other reaction(s): WEIGHT LOSS  :  Family History  Problem Relation Age of Onset  . Heart failure Father   . Emphysema Mother   . Heart attack Mother   . Asthma Sister   . Asthma Maternal Uncle   . Arthritis Maternal Grandmother   . Cancer Maternal Grandmother   . Cancer Sister   . Cancer      aunts  . Cancer      uncles  :  Social History   Social History  . Marital status: Married    Spouse name: N/A  . Number of children: N/A  . Years of education: N/A   Occupational History  . accounting tech     [employer]   Social History Main Topics  . Smoking status: Current Every Day Smoker    Packs/day: 0.50     Years: 40.00    Types: Cigarettes  . Smokeless tobacco: Never Used     Comment: Bad day 6-7 cigs a day  . Alcohol use No  . Drug use: Unknown  . Sexual activity: Not on file   Other Topics Concern  . Not on file   Social History Narrative  .  No narrative on file  :  Pertinent items are noted in HPI.  Exam: @IPVITALS @ Well-developed and well-nourished white female in no obvious distress. Vital signs show temp or of 98.4. Pulse 92. Blood pressure 115/69. Weight is 131 pounds. Head and neck exam shows the right ocular enucleation. There is no erythema or edema with the right eye. Left eye shows good pupillary reaction area and there is good extraocular muscle movement with the left eye. There are no oral lesions. She has no adenopathy in the neck. Lungs are with some wheezes bilaterally. Cardiac exam regular rate and rhythm with no murmurs, rubs or bruits. Abdomen is soft. She has good bowel sounds. There is no fluid wave. There is no palpable liver or spleen tip. Back exam shows no tenderness over the spine, ribs or hips. Extremities shows no clubbing, cyanosis or edema. Neurological exam shows no focal neurological deficit. Skin exam shows no rashes, ecchymoses or petechia.     No results for input(s): WBC, HGB, HCT, PLT in the last 72 hours. No results for input(s): NA, K, CL, CO2, GLUCOSE, BUN, CREATININE, CALCIUM in the last 72 hours.  Blood smear review:  None  Pathology: See above     Assessment and Plan:  Carol Quinn is a very charming 61 year old white female. She has an ocular melanoma of the right eye. This is a fairly large melanoma. She had enucleation.  I find it very interesting about the prognostic study that was done. I must say, that I am not well versed in that study but it definitely looks interesting and I'm sure will be fairly accurate.  She definitely has a high risk of recurrence. As such, I do we have to follow her closely.  I think we have to get some  baseline studies on her. Her last scans were done about 4 months ago. These were all preop.  I would like to get some scans when I see her back. I will plan to her back in April. I think this would be a good time frame for follow-up.  The difficult situation with ocular melanoma is the fact that they are quite difficult to treat once they metastasize. They are often BRAF negative., I do not think that ocular melanomas do all that well with immunotherapy.  I know that her quality of life will be much better when she gets the ocular prosthetic.  It is possible she may need some physical therapy to try to help with her balance because of the missing right eye.  I spent about an hour with she and her husband. They're both very nice. I answered all their questions. I gave her a prayer blanket which she was very thankful for.  We will plan to get her back in another couple months.

## 2016-02-29 ENCOUNTER — Ambulatory Visit (INDEPENDENT_AMBULATORY_CARE_PROVIDER_SITE_OTHER): Payer: 59 | Admitting: Internal Medicine

## 2016-02-29 ENCOUNTER — Encounter: Payer: Self-pay | Admitting: Internal Medicine

## 2016-02-29 VITALS — BP 114/70 | HR 74 | Ht 62.0 in | Wt 132.0 lb

## 2016-02-29 DIAGNOSIS — J9611 Chronic respiratory failure with hypoxia: Secondary | ICD-10-CM | POA: Diagnosis not present

## 2016-02-29 DIAGNOSIS — J449 Chronic obstructive pulmonary disease, unspecified: Secondary | ICD-10-CM | POA: Diagnosis not present

## 2016-02-29 DIAGNOSIS — C6931 Malignant neoplasm of right choroid: Secondary | ICD-10-CM

## 2016-02-29 DIAGNOSIS — J441 Chronic obstructive pulmonary disease with (acute) exacerbation: Secondary | ICD-10-CM

## 2016-02-29 DIAGNOSIS — Z720 Tobacco use: Secondary | ICD-10-CM

## 2016-02-29 MED ORDER — VARENICLINE TARTRATE 0.5 MG X 11 & 1 MG X 42 PO MISC: ORAL | 2 refills | Status: DC

## 2016-02-29 MED ORDER — PREDNISONE 10 MG PO TABS: ORAL_TABLET | ORAL | 0 refills | Status: DC

## 2016-02-29 MED ORDER — TRAMADOL HCL 50 MG PO TABS: 50.0000 mg | ORAL_TABLET | Freq: Four times a day (QID) | ORAL | 1 refills | Status: DC | PRN

## 2016-02-29 NOTE — Assessment & Plan Note (Signed)
She declares willingness to try quitting again. Successful once with Chantix until it ran out. Education done. Try Chantix again with a refill for continuity.

## 2016-02-29 NOTE — Assessment & Plan Note (Addendum)
She is at risk for future metastasis to lung. Oncology has scheduled a future chest CT for April, 2018.

## 2016-02-29 NOTE — Assessment & Plan Note (Signed)
Recent exacerbation may be viral. Plan-tramadol refill for cough, prednisone with discussion. Continue present meds.

## 2016-02-29 NOTE — Assessment & Plan Note (Signed)
She is not strong enough to manage portable tank oxygen and needs assessment for a lighter system. I think she would manage with pulse oxygen at 2-3 L

## 2016-02-29 NOTE — Progress Notes (Signed)
Patient ID: Carol Quinn, female    DOB: 04-Sep-1955, 61 y.o.   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 Past few days having a cough-productive-gray in color. Wants to discuss prior to surgery if there is anything to do or take to keep herself well. Still smoking but accepts prescription for Chantix to start as she leaves hospital after planned surgery. Breathing has been stable with occasional cough, clear phlegm, occasional wheeze. Easy dyspnea with ADLs. Asks refill tramadol for cough Reports flu shot at primary office last week. CT chest 10/16/2015 IMPRESSION: 1. No evidence of metastatic disease within the chest or abdomen. 2. No acute findings. 3. Advanced emphysema. Apical pleural parenchymal scarring in the lungs. 4. 3.9 cm liver cyst. 5. Significant aortic atherosclerosis most evident in the abdomen. No aneurysm. 6. Disk degenerative changes with significant reactive endplate sclerosis at 624THL. Electronically  Signed   By: Lajean Manes M.D.   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 Pt continues her inhalers. Chest CT pending for oncology follow-up in April Still smoking and willing to try Chantix again O2 2L sleep and exertion/ APS- uses and depends on O2. Needs light POC to manage the weight of portable O2.. Her current portable tank is too heavy for her. Increased shortness of breath over the last 3 weeks with rest and exertion. Increased wheeze. Cough productive white. Using nebulizer more. Desaturates if not wearing oxygen based on her home oximetry. Denies chest pain, fever, purulent sputum.  ROS-see HPI Constitutional:   No-   weight loss, night sweats, fevers, chills, +fatigue, lassitude. HEENT:   No-  headaches, difficulty swallowing, tooth/dental problems, sore throat,       No-  sneezing, itching, ear ache, nasal congestion, +post nasal drip,  CV:  No-   chest pain, orthopnea, PND, swelling in lower extremities, anasarca, dizziness, palpitations Resp: + shortness of breath with exertion or at rest.               + productive cough,  + non-productive cough,  No- coughing up of blood.               change in color of mucus.  + wheezing.   Skin: No-   rash or lesions. GI:  No-   heartburn, indigestion, abdominal pain, nausea, vomiting,  GU:  MS:  No-   joint pain or swelling.   Neuro-  nothing unusual Psych:  No- change in mood or affect. No depression or anxiety.  No memory loss.  OBJ- Physical Exam   General- Alert, Oriented, Affect-appropriate,   Skin- clear  Lymphadenopathy- none Head- atraumatic            Eyes- Gross vision intact/ glasses, PERRLA, conjunctivae and secretions clear            Ears-  Hearing, canals-normal            Nose- clear, no-Septal dev, mucus, polyps, erosion, perforation             Throat- Mallampati II , mucosa clear/ not red , drainage- none, tonsils- atrophic Neck- flexible , trachea midline, no stridor , thyroid nl, carotid no bruit Chest - symmetrical excursion , unlabored           Heart/CV- RRR , no murmur , no gallop  , no rub, nl s1 s2                           - JVD- none , edema- none, stasis changes- none, varices- none           Lung-  wheeze -none, cough + dry with deep inspiration , dullness-none, rub- none,                                + distant breath sounds           Chest wall-  Abd-  Br/ Gen/ Rectal- Not done, not indicated Extrem- cyanosis- none, clubbing, none, atrophy- none, strength- nl Neuro- grossly intact to observation

## 2016-02-29 NOTE — Patient Instructions (Addendum)
Order- DME APS- please evaluate for light portable O2 system 2-3 L/m   Dx COPD mixed type, chronic respiratory failure with hypoxia  We will take care of insurance form and call you when it is ready to pick up.  Script for tramadol for cough  Script for prednisone  Script for Chantix  Please call as needed

## 2016-03-12 ENCOUNTER — Encounter: Payer: Self-pay | Admitting: Internal Medicine

## 2016-04-09 ENCOUNTER — Other Ambulatory Visit: Payer: Self-pay | Admitting: Family Medicine

## 2016-04-21 ENCOUNTER — Telehealth: Payer: Self-pay | Admitting: *Deleted

## 2016-04-21 ENCOUNTER — Ambulatory Visit (HOSPITAL_BASED_OUTPATIENT_CLINIC_OR_DEPARTMENT_OTHER)
Admission: RE | Admit: 2016-04-21 | Discharge: 2016-04-21 | Disposition: A | Discharge status: Home / Self Care | Payer: 59 | Source: Ambulatory Visit | Attending: Hematology & Oncology | Admitting: Hematology & Oncology

## 2016-04-21 ENCOUNTER — Encounter (HOSPITAL_BASED_OUTPATIENT_CLINIC_OR_DEPARTMENT_OTHER): Payer: Self-pay

## 2016-04-21 ENCOUNTER — Ambulatory Visit (HOSPITAL_BASED_OUTPATIENT_CLINIC_OR_DEPARTMENT_OTHER): Payer: 59 | Admitting: Hematology & Oncology

## 2016-04-21 ENCOUNTER — Other Ambulatory Visit (HOSPITAL_BASED_OUTPATIENT_CLINIC_OR_DEPARTMENT_OTHER): Payer: 59

## 2016-04-21 VITALS — BP 111/63 | HR 86 | Temp 97.7°F | Resp 20 | Wt 131.1 lb

## 2016-04-21 DIAGNOSIS — I700 Atherosclerosis of aorta: Secondary | ICD-10-CM | POA: Diagnosis not present

## 2016-04-21 DIAGNOSIS — Z85840 Personal history of malignant neoplasm of eye: Secondary | ICD-10-CM | POA: Diagnosis not present

## 2016-04-21 DIAGNOSIS — Z1239 Encounter for other screening for malignant neoplasm of breast: Secondary | ICD-10-CM

## 2016-04-21 DIAGNOSIS — C6931 Malignant neoplasm of right choroid: Secondary | ICD-10-CM | POA: Diagnosis present

## 2016-04-21 DIAGNOSIS — Z87891 Personal history of nicotine dependence: Secondary | ICD-10-CM

## 2016-04-21 HISTORY — DX: Malignant (primary) neoplasm, unspecified: C80.1

## 2016-04-21 LAB — CBC WITH DIFFERENTIAL (CANCER CENTER ONLY)
BASO#: 0.1 10*3/uL (ref 0.0–0.2)
BASO%: 0.8 % (ref 0.0–2.0)
EOS ABS: 0.2 10*3/uL (ref 0.0–0.5)
EOS%: 3.3 % (ref 0.0–7.0)
HCT: 43.1 % (ref 34.8–46.6)
HGB: 14.6 g/dL (ref 11.6–15.9)
LYMPH#: 1.7 10*3/uL (ref 0.9–3.3)
LYMPH%: 23.5 % (ref 14.0–48.0)
MCH: 33.9 pg (ref 26.0–34.0)
MCHC: 33.9 g/dL (ref 32.0–36.0)
MCV: 100 fL (ref 81–101)
MONO#: 0.7 10*3/uL (ref 0.1–0.9)
MONO%: 10 % (ref 0.0–13.0)
NEUT%: 62.4 % (ref 39.6–80.0)
NEUTROS ABS: 4.5 10*3/uL (ref 1.5–6.5)
Platelets: 267 10*3/uL (ref 145–400)
RBC: 4.31 10*6/uL (ref 3.70–5.32)
RDW: 12.1 % (ref 11.1–15.7)
WBC: 7.2 10*3/uL (ref 3.9–10.0)

## 2016-04-21 LAB — CMP (CANCER CENTER ONLY)
ALBUMIN: 3.7 g/dL (ref 3.3–5.5)
ALT(SGPT): 28 U/L (ref 10–47)
AST: 26 U/L (ref 11–38)
Alkaline Phosphatase: 98 U/L — ABNORMAL HIGH (ref 26–84)
BUN, Bld: 13 mg/dL (ref 7–22)
CHLORIDE: 99 meq/L (ref 98–108)
CO2: 31 mEq/L (ref 18–33)
CREATININE: 0.7 mg/dL (ref 0.6–1.2)
Calcium: 9.6 mg/dL (ref 8.0–10.3)
Glucose, Bld: 88 mg/dL (ref 73–118)
POTASSIUM: 4.5 meq/L (ref 3.3–4.7)
SODIUM: 140 meq/L (ref 128–145)
TOTAL PROTEIN: 6.4 g/dL (ref 6.4–8.1)
Total Bilirubin: 0.4 mg/dl (ref 0.20–1.60)

## 2016-04-21 LAB — LACTATE DEHYDROGENASE: LDH: 147 U/L (ref 125–245)

## 2016-04-21 MED ORDER — IOPAMIDOL (ISOVUE-300) INJECTION 61%
100.0000 mL | Freq: Once | INTRAVENOUS | Status: AC | PRN
  Administered 2016-04-21: 100 mL via INTRAVENOUS

## 2016-04-21 NOTE — Telephone Encounter (Addendum)
Patient is in the office today. Will give results.  ----- Message from [name], MD sent at 04/21/2016  9:46 AM EDT ----- Call - NO melanoma seen on the CT scan!!!  pete

## 2016-04-21 NOTE — Progress Notes (Signed)
Hematology and Oncology Follow Up Visit  Carol Quinn [id number] March 23, 1955 61 y.o. 04/21/2016   Principle Diagnosis:   Choroidal melanoma of the right eye, status post right eye enucleation  Current Therapy:    Observation     Interim History:  Carol Quinn is back for follow-up. This is her second office visit. We first saw her back in January. At that time, she had her right eye enucleated because of a choroidal melanoma. This was a large melanoma. By staging, it was a pT3b lesion.  There was a genetic risk factor analysis done on this. This test was a DecisionDx-UM test which showed placed her into class II for recurrence. This is a higher risk category for recurrence.  She does have pulmonary problems. She has emphysema. She was a heavy smoker. Graft we did do scans on her today. She had scans of the chest/abdomen/pelvis. The scans did not show any evidence of recurrent disease.  She now has a right ocular prosthetic. This is helping her.  She's had no nausea or vomiting. She's had no pain. She has had no change in bowel or bladder habits. She has had no headache.  Of note, she does take the ocular prosthetic out to clean it.  Overall, her performance status is ECOG 1.  Medications:  Current Outpatient Prescriptions:  .  modafinil (PROVIGIL) 200 MG tablet, Take 1 tablet (200 mg total) by mouth daily., Disp: 30 tablet, Rfl: 5 .  montelukast (SINGULAIR) 10 MG tablet, Take 1 tablet (10 mg total) by mouth at bedtime., Disp: 30 tablet, Rfl: prn .  omeprazole (PRILOSEC) 40 MG capsule, TAKE 1 CAPSULE BY MOUTH ONCE DAILY., Disp: 30 capsule, Rfl: 6 .  potassium chloride SA (K-DUR,KLOR-CON) 20 MEQ tablet, Take 1 tablet (20 mEq total) by mouth daily., Disp: 30 tablet, Rfl: 6 .  predniSONE (DELTASONE) 10 MG tablet, 2 daily x 3 days, then one daily until you run out., Disp: 20 tablet, Rfl: 0 .  Respiratory Therapy Supplies (FLUTTER) DEVI, Use as directed, Disp: 1 each, Rfl: 0 .  SPIRIVA  HANDIHALER 18 MCG inhalation capsule, INHALE THE CONTENTS OF 1 CAPSULE ONCE DAILY AS DIRECTED., Disp: 30 capsule, Rfl: 6 .  SUMAtriptan (IMITREX) 100 MG tablet, Take one tablet at onset of migraine, repeat in 2 hours if needed., Disp: 10 tablet, Rfl: 6 .  SYMBICORT 160-4.5 MCG/ACT inhaler, USE 2 PUFFS TWICE DAILY, Disp: 10.2 g, Rfl: 5 .  traMADol (ULTRAM) 50 MG tablet, Take 1 tablet (50 mg total) by mouth every 6 (six) hours as needed., Disp: 40 tablet, Rfl: 1 .  varenicline (CHANTIX PAK) 0.5 MG X 11 & 1 MG X 42 tablet, Take one 0.5 mg tablet by mouth once daily for 3 days, then increase to one 0.5 mg tablet twice daily for 4 days, then increase to one 1 mg tablet twice daily., Disp: 53 tablet, Rfl: 2 .  zolmitriptan (ZOMIG) 5 MG nasal solution, Take as directed as needed for migraines , Disp: , Rfl:   Allergies:  Allergies  Allergen Reactions  . Biaxin [Clarithromycin]     Rash   . Codeine   . Hydrocodone-Homatropine     paranoia  . Penicillins   . Topiramate     Other reaction(s): WEIGHT LOSS    Past Medical History, Surgical history, Social history, and Family History were reviewed and updated.  Review of Systems: As above  Physical Exam:  weight is 131 lb 1.9 oz (59.5 kg). Her oral temperature is  97.7 F (36.5 C). Her blood pressure is 111/63 and her pulse is 86. Her respiration is 20 and oxygen saturation is 98%.   Wt Readings from Last 3 Encounters:  04/21/16 131 lb 1.9 oz (59.5 kg)  02/29/16 132 lb (59.9 kg)  02/04/16 131 lb 6.4 oz (59.6 kg)     Well-developed and well-nourished white female in no obvious distress. Head and neck exam shows the right ocular prosthetic. This is well fitted. She has some slight ptosis of the right eye. There is good extra ocular muscle movement of the left eye. There is no adenopathy in the neck. Lungs are clear. Cardiac exam regular rate and rhythm with no murmurs, rubs or bruits. Abdomen is soft. She has good bowel sounds per there is no  fluid wave. There is no palpable liver or spleen tip. Back exam shows no tenderness over the spine, ribs or hips. Extremities shows no clubbing, cyanosis or edema. Skin exam shows no rashes, ecchymoses or petechia. Neurological exam shows no focal neurological deficits.  Lab Results  Component Value Date   WBC 7.2 04/21/2016   HGB 14.6 04/21/2016   HCT 43.1 04/21/2016   MCV 100 04/21/2016   PLT 267 04/21/2016     Chemistry      Component Value Date/Time   NA 140 04/21/2016 0948   K 4.5 04/21/2016 0948   CL 99 04/21/2016 0948   CO2 31 04/21/2016 0948   BUN 13 04/21/2016 0948   CREATININE 0.7 04/21/2016 0948      Component Value Date/Time   CALCIUM 9.6 04/21/2016 0948   ALKPHOS 98 (H) 04/21/2016 0948   AST 26 04/21/2016 0948   ALT 28 04/21/2016 0948   BILITOT 0.40 04/21/2016 0948         Impression and Plan: Carol Quinn is a 61 year old white female. She has a choroidal melanoma of the right eye. She underwent right eye enucleation. She had the enucleation back in October 2017.  We will have to be aggressive with follow-up. I think that surveillance will have to be often. We will have to get scans on her. I do not like the fact that she is at a higher risk for recurrence.  We will plan to get her back in 4 months. We will do a scan will we see her back.  She knows that she can always call us if she has any problems.   [name], MD 4/12/20185:40 PM

## 2016-04-28 ENCOUNTER — Encounter: Payer: Self-pay | Admitting: Internal Medicine

## 2016-04-28 ENCOUNTER — Ambulatory Visit (INDEPENDENT_AMBULATORY_CARE_PROVIDER_SITE_OTHER): Payer: 59 | Admitting: Internal Medicine

## 2016-04-28 DIAGNOSIS — C6931 Malignant neoplasm of right choroid: Secondary | ICD-10-CM | POA: Diagnosis not present

## 2016-04-28 DIAGNOSIS — Z720 Tobacco use: Secondary | ICD-10-CM

## 2016-04-28 DIAGNOSIS — J449 Chronic obstructive pulmonary disease, unspecified: Secondary | ICD-10-CM

## 2016-04-28 DIAGNOSIS — J9611 Chronic respiratory failure with hypoxia: Secondary | ICD-10-CM | POA: Diagnosis not present

## 2016-04-28 DIAGNOSIS — J302 Other seasonal allergic rhinitis: Secondary | ICD-10-CM | POA: Diagnosis not present

## 2016-04-28 DIAGNOSIS — J3089 Other allergic rhinitis: Secondary | ICD-10-CM | POA: Diagnosis not present

## 2016-04-28 DIAGNOSIS — R2689 Other abnormalities of gait and mobility: Secondary | ICD-10-CM

## 2016-04-28 MED ORDER — PREDNISONE 10 MG PO TABS: ORAL_TABLET | ORAL | 0 refills | Status: DC

## 2016-04-28 MED ORDER — ALBUTEROL SULFATE HFA 108 (90 BASE) MCG/ACT IN AERS: 2.0000 | INHALATION_SPRAY | Freq: Four times a day (QID) | RESPIRATORY_TRACT | 12 refills | Status: DC | PRN

## 2016-04-28 MED ORDER — MODAFINIL 200 MG PO TABS: 200.0000 mg | ORAL_TABLET | Freq: Every day | ORAL | 5 refills | Status: DC

## 2016-04-28 MED ORDER — TRAMADOL HCL 50 MG PO TABS: 50.0000 mg | ORAL_TABLET | Freq: Four times a day (QID) | ORAL | 4 refills | Status: DC | PRN

## 2016-04-28 MED ORDER — MONTELUKAST SODIUM 10 MG PO TABS: 10.0000 mg | ORAL_TABLET | Freq: Every day | ORAL | 99 refills | Status: DC

## 2016-04-28 MED ORDER — DOXYCYCLINE HYCLATE 100 MG PO TABS: ORAL_TABLET | ORAL | 0 refills | Status: DC

## 2016-04-28 MED ORDER — TIOTROPIUM BROMIDE MONOHYDRATE 18 MCG IN CAPS: ORAL_CAPSULE | RESPIRATORY_TRACT | 12 refills | Status: DC

## 2016-04-28 MED ORDER — BUDESONIDE-FORMOTEROL FUMARATE 160-4.5 MCG/ACT IN AERO: INHALATION_SPRAY | RESPIRATORY_TRACT | 12 refills | Status: DC

## 2016-04-28 NOTE — Progress Notes (Signed)
Patient ID: Carol Quinn, female    DOB: [birth year], 61 y.o.   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 Past few days having a cough-productive-gray in color. Wants to discuss prior to surgery if there is anything to do or take to keep herself well. Still smoking but accepts prescription for Chantix to start as she leaves hospital after planned surgery. Breathing has been stable with occasional cough, clear phlegm, occasional wheeze. Easy dyspnea with ADLs. Asks refill tramadol for cough Reports flu shot at primary office last week. CT chest 10/16/2015 IMPRESSION: 1. No evidence of metastatic disease within the chest or abdomen. 2. No acute findings. 3. Advanced emphysema. Apical pleural parenchymal scarring in the lungs. 4. 3.9 cm liver cyst. 5. Significant aortic atherosclerosis most evident in the abdomen. No aneurysm. 6. Disk degenerative changes with significant reactive endplate sclerosis at D2-K0. Electronically  Signed   By: Lajean Manes M.D.   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 Pt continues her inhalers. Chest CT pending for oncology follow-up in April Still smoking and willing to try Chantix again O2 2L sleep and exertion/ APS- uses and depends on O2. Needs light POC to manage the weight of portable O2.. Her current portable tank is too heavy for her. Increased shortness of breath over the last 3 weeks with rest and exertion. Increased wheeze. Cough productive white. Using nebulizer more. Desaturates if not wearing oxygen based on her home oximetry. Denies chest pain, fever, purulent sputum.  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 Hopefully that will do well. Continues oxygen 2 L for sleep and as needed for exertion/APS. Has POC. Recent allergic rhinitis symptoms and over the last 3 weeks has had some increasing cough with scant green sputum Unfortunately she continues to smoke a little and encouraged to make every effort to stop. She has been given Chantix. She asks prednisone to hold CT-chest 04/21/16- Chest Impression: I reviewed images with her noting what appears  to be chronic biapical scarring which we will want to watch. No  evidence of thoracic metastasis. Abdomen / Pelvis  Impression: No evidence of metastatic disease in the abdomen pelvis. Atherosclerotic calcification of the aorta (ICD10-170.0  ROS-see HPI Constitutional:   No-   weight loss, night sweats, fevers, chills, +fatigue, lassitude. HEENT:   No-  headaches, difficulty swallowing, tooth/dental problems, sore throat,       No-  sneezing, itching, ear ache, nasal congestion, +post nasal drip,  CV:  No-   chest pain, orthopnea, PND, swelling in lower extremities, anasarca, dizziness, palpitations Resp: + shortness of breath with exertion or at rest.               + productive cough,  + non-productive cough,  No- coughing up of blood.               change in color of mucus.  + wheezing.   Skin: No-   rash or lesions. GI:  No-   heartburn, indigestion, abdominal pain, nausea, vomiting,  GU:  MS:  No-   joint pain or swelling.   Neuro-     nothing unusual Psych:  No- change in mood or affect. No depression or anxiety.  No memory loss.  OBJ- Physical Exam   General- Alert, Oriented, Affect-appropriate,   Skin- clear  Lymphadenopathy- none Head- atraumatic            Eyes- + R prosthesis            Ears- Hearing, canals-normal            Nose- clear, no-Septal dev, mucus, polyps, erosion, perforation             Throat- Mallampati IV , mucosa clear/ not red , drainage- none, tonsils- atrophic Neck- flexible , trachea midline, no stridor , thyroid nl, carotid no bruit Chest - symmetrical excursion , unlabored           Heart/CV- RRR , no murmur , no gallop  , no rub, nl s1 s2                           - JVD- none , edema- none, stasis changes- none, varices- none           Lung-  wheeze + faint expiratory, cough-none , dullness-none, rub- none,                                + distant breath sounds           Chest wall-  Abd-  Br/ Gen/ Rectal- Not done, not indicated Extrem- cyanosis- none, clubbing, none, atrophy- none, strength- nl Neuro- grossly intact to observation

## 2016-04-28 NOTE — Patient Instructions (Addendum)
Scripts refilled and sent    Provigil is printed  Script sent for prednisone taper and doxycycline  Suggest Flonase nasal spray otc     1-2 puffs each nostril once or twice daily along with the singulair and zyrtec while needed  Script printed for tramadol  Please call if we can help

## 2016-04-29 NOTE — Assessment & Plan Note (Signed)
Identified now as high risk for future metastasis. Being managed/monitored by oncology with follow-up CT ordered.

## 2016-04-29 NOTE — Assessment & Plan Note (Signed)
Overwhelming importance of strict abstinence emphasized. She has had Chantix and I think that she gets over her eye surgery, she is going to be able to tackle this problem.

## 2016-04-29 NOTE — Assessment & Plan Note (Signed)
She has Allegra and montelukast now. Can add Flonase if needed.

## 2016-04-29 NOTE — Assessment & Plan Note (Signed)
Continues oxygen for sleep and exertion. Adequate on room air at rest.

## 2016-04-29 NOTE — Assessment & Plan Note (Signed)
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 . She is being extra careful.

## 2016-04-29 NOTE — Assessment & Plan Note (Signed)
Mild bronchitic exacerbation. She has appropriate meds. We will let her keep some prednisone on hand with appropriate education and discussion done. Add doxycycline.

## 2016-05-05 ENCOUNTER — Ambulatory Visit (HOSPITAL_BASED_OUTPATIENT_CLINIC_OR_DEPARTMENT_OTHER)
Admission: RE | Admit: 2016-05-05 | Discharge: 2016-05-05 | Disposition: A | Discharge status: Home / Self Care | Payer: 59 | Source: Ambulatory Visit | Attending: Hematology & Oncology | Admitting: Hematology & Oncology

## 2016-05-05 ENCOUNTER — Encounter (HOSPITAL_BASED_OUTPATIENT_CLINIC_OR_DEPARTMENT_OTHER): Payer: Self-pay

## 2016-05-05 DIAGNOSIS — C6931 Malignant neoplasm of right choroid: Secondary | ICD-10-CM

## 2016-05-05 DIAGNOSIS — Z1231 Encounter for screening mammogram for malignant neoplasm of breast: Secondary | ICD-10-CM | POA: Insufficient documentation

## 2016-05-05 DIAGNOSIS — Z1239 Encounter for other screening for malignant neoplasm of breast: Secondary | ICD-10-CM

## 2016-05-11 ENCOUNTER — Telehealth: Payer: Self-pay | Admitting: Internal Medicine

## 2016-05-11 NOTE — Telephone Encounter (Signed)
Rec'd long term disability forms via fax from [employer] along with request for medical records - fwd to [employer] via interoffice mail -pr

## 2016-05-17 ENCOUNTER — Encounter: Payer: Self-pay | Admitting: General Practice

## 2016-05-17 ENCOUNTER — Other Ambulatory Visit: Payer: Self-pay | Admitting: Family Medicine

## 2016-05-17 NOTE — Telephone Encounter (Signed)
Last OV 05/05/15 (CPE) Losartan/HCTZ 04/15/15 #30 with 6 Atorvastatin 04/11/16 #30 with 0

## 2016-05-17 NOTE — Telephone Encounter (Signed)
[facility] [place] for 1 month of each but pt needs appt

## 2016-05-25 NOTE — Telephone Encounter (Signed)
Rec'd forms back from [employer] - fwd to [name] to have CY complete - pr

## 2016-05-25 NOTE — Telephone Encounter (Signed)
Forms in green folder placed on CY's cart to complete.

## 2016-05-27 NOTE — Telephone Encounter (Signed)
Green folder with Disability papers has been given to [place] to return.

## 2016-05-27 NOTE — Telephone Encounter (Signed)
done

## 2016-05-27 NOTE — Telephone Encounter (Signed)
Rec'd completed disability forms - fwd to [employer] via interoffice mail -pr  °

## 2016-06-09 ENCOUNTER — Telehealth: Payer: Self-pay | Admitting: Internal Medicine

## 2016-06-09 MED ORDER — PREDNISONE 10 MG PO TABS: ORAL_TABLET | ORAL | 0 refills | Status: DC

## 2016-06-09 MED ORDER — DOXYCYCLINE HYCLATE 100 MG PO TABS: 100.0000 mg | ORAL_TABLET | Freq: Two times a day (BID) | ORAL | 0 refills | Status: DC

## 2016-06-09 MED ORDER — TRAMADOL HCL 50 MG PO TABS: 50.0000 mg | ORAL_TABLET | Freq: Three times a day (TID) | ORAL | 0 refills | Status: DC | PRN

## 2016-06-09 NOTE — Telephone Encounter (Signed)
Clarified tramadol rx verbally with CY- ok to call in 50mg  tabs, to be taken 1-2 po TID PRN.  Spoke with pt, aware of recs.  rx's sent to preferred pharmacy.  Nothing further needed.

## 2016-06-09 NOTE — Telephone Encounter (Signed)
Called and spoke with pt. Pt c/o prod cough with green mucus, increase in SOB, chest tightness, rib soreness d/t cough x 2 weeks - pt states her s/s have just progressively gotten worse. Denies f/c/s. Pt is requesting Doxy, pred taper, and tramadol. Pt uses [facility].   Dr. Annamaria Boots please advise. Thanks.   Allergies  Allergen Reactions  . Biaxin [Clarithromycin]     Rash   . Codeine   . Hydrocodone-Homatropine     paranoia  . Penicillins   . Topiramate     Other reaction(s): WEIGHT LOSS    Current Outpatient Prescriptions on File Prior to Visit  Medication Sig Dispense Refill  . albuterol (PROVENTIL HFA;VENTOLIN HFA) 108 (90 Base) MCG/ACT inhaler Inhale 2 puffs into the lungs every 6 (six) hours as needed for wheezing or shortness of breath. 1 Inhaler 12  . atorvastatin (LIPITOR) 20 MG tablet TAKE 1 TABLET BY MOUTH ONCE DAILY 30 tablet 0  . budesonide-formoterol (SYMBICORT) 160-4.5 MCG/ACT inhaler Inhale 2 puffs then rinse mouth, twice daily 10.2 g 12  . losartan-hydrochlorothiazide (HYZAAR) 100-25 MG tablet TAKE 1 TABLET BY MOUTH DAILY 30 tablet 0  . modafinil (PROVIGIL) 200 MG tablet Take 1 tablet (200 mg total) by mouth daily. 30 tablet 5  . montelukast (SINGULAIR) 10 MG tablet Take 1 tablet (10 mg total) by mouth at bedtime. 30 tablet prn  . omeprazole (PRILOSEC) 40 MG capsule TAKE 1 CAPSULE BY MOUTH ONCE DAILY. 30 capsule 6  . potassium chloride SA (K-DUR,KLOR-CON) 20 MEQ tablet Take 1 tablet (20 mEq total) by mouth daily. 30 tablet 6  . Respiratory Therapy Supplies (FLUTTER) DEVI Use as directed 1 each 0  . SUMAtriptan (IMITREX) 100 MG tablet Take one tablet at onset of migraine, repeat in 2 hours if needed. 10 tablet 6  . tiotropium (SPIRIVA HANDIHALER) 18 MCG inhalation capsule INHALE THE CONTENTS OF 1 CAPSULE ONCE DAILY AS DIRECTED. 30 capsule 12  . traMADol (ULTRAM) 50 MG tablet Take 1 tablet (50 mg total) by mouth every 6 (six) hours as needed. 40 tablet 4  . zolmitriptan  (ZOMIG) 5 MG nasal solution Take as directed as needed for migraines      No current facility-administered medications on file prior to visit.

## 2016-06-09 NOTE — Telephone Encounter (Signed)
Yes- ok. Hope this helps and that she feels better quickly.  Doxycycline 100 mg, # 14, ref x 1       1 twice daily         Prednisone 10 mg, # 20,  4 X 2 DAYS, 3 X 2 DAYS, 2 X 2 DAYS, 1 X 2 DAYS  Tramadol  100 mg, # 30,    1 every 8 hours if needed for cough

## 2016-06-16 ENCOUNTER — Other Ambulatory Visit: Payer: Self-pay | Admitting: Family Medicine

## 2016-06-16 ENCOUNTER — Encounter: Payer: Self-pay | Admitting: General Practice

## 2016-06-16 NOTE — Telephone Encounter (Signed)
Tried calling pt to schedule an office visit, could not leave a message. Letter mailed.

## 2016-06-28 ENCOUNTER — Ambulatory Visit (INDEPENDENT_AMBULATORY_CARE_PROVIDER_SITE_OTHER): Payer: 59 | Admitting: Family Medicine

## 2016-06-28 ENCOUNTER — Encounter: Payer: Self-pay | Admitting: Family Medicine

## 2016-06-28 VITALS — BP 112/83 | HR 82 | Temp 98.1°F | Resp 16 | Ht 62.0 in | Wt 133.2 lb

## 2016-06-28 DIAGNOSIS — I10 Essential (primary) hypertension: Secondary | ICD-10-CM | POA: Diagnosis not present

## 2016-06-28 DIAGNOSIS — E785 Hyperlipidemia, unspecified: Secondary | ICD-10-CM | POA: Insufficient documentation

## 2016-06-28 DIAGNOSIS — J449 Chronic obstructive pulmonary disease, unspecified: Secondary | ICD-10-CM | POA: Diagnosis not present

## 2016-06-28 LAB — CBC WITH DIFFERENTIAL/PLATELET
BASOS PCT: 1.3 % (ref 0.0–3.0)
Basophils Absolute: 0.1 10*3/uL (ref 0.0–0.1)
EOS PCT: 3.6 % (ref 0.0–5.0)
Eosinophils Absolute: 0.2 10*3/uL (ref 0.0–0.7)
HCT: 42.5 % (ref 36.0–46.0)
Hemoglobin: 14.2 g/dL (ref 12.0–15.0)
Lymphocytes Relative: 27.6 % (ref 12.0–46.0)
Lymphs Abs: 1.8 10*3/uL (ref 0.7–4.0)
MCHC: 33.4 g/dL (ref 30.0–36.0)
MCV: 99.8 fl (ref 78.0–100.0)
MONOS PCT: 8.5 % (ref 3.0–12.0)
Monocytes Absolute: 0.5 10*3/uL (ref 0.1–1.0)
NEUTROS ABS: 3.8 10*3/uL (ref 1.4–7.7)
NEUTROS PCT: 59 % (ref 43.0–77.0)
Platelets: 287 10*3/uL (ref 150.0–400.0)
RBC: 4.26 Mil/uL (ref 3.87–5.11)
RDW: 13.3 % (ref 11.5–15.5)
WBC: 6.4 10*3/uL (ref 4.0–10.5)

## 2016-06-28 LAB — BASIC METABOLIC PANEL
BUN: 15 mg/dL (ref 6–23)
CALCIUM: 9.6 mg/dL (ref 8.4–10.5)
CO2: 33 mEq/L — ABNORMAL HIGH (ref 19–32)
Chloride: 100 mEq/L (ref 96–112)
Creatinine, Ser: 0.58 mg/dL (ref 0.40–1.20)
GFR: 112.9 mL/min (ref >60.00)
Glucose, Bld: 100 mg/dL — ABNORMAL HIGH (ref 70–99)
POTASSIUM: 4.8 meq/L (ref 3.5–5.1)
Sodium: 139 mEq/L (ref 135–145)

## 2016-06-28 LAB — LIPID PANEL
Cholesterol: 199 mg/dL (ref 0–200)
HDL: 71.4 mg/dL (ref >39.00)
LDL CALC: 104 mg/dL — AB (ref 0–99)
NONHDL: 128.06
Total CHOL/HDL Ratio: 3
Triglycerides: 118 mg/dL (ref 0.0–149.0)
VLDL: 23.6 mg/dL (ref 0.0–40.0)

## 2016-06-28 LAB — HEPATIC FUNCTION PANEL
ALBUMIN: 4.1 g/dL (ref 3.5–5.2)
ALK PHOS: 92 U/L (ref 39–117)
ALT: 21 U/L (ref 0–35)
AST: 16 U/L (ref 0–37)
BILIRUBIN DIRECT: 0.1 mg/dL (ref 0.0–0.3)
BILIRUBIN TOTAL: 0.3 mg/dL (ref 0.2–1.2)
Total Protein: 6 g/dL (ref 6.0–8.3)

## 2016-06-28 MED ORDER — IPRATROPIUM-ALBUTEROL 0.5-2.5 (3) MG/3ML IN SOLN
3.0000 mL | Freq: Once | RESPIRATORY_TRACT | Status: AC
Start: 2016-06-28 — End: 2016-06-28
  Administered 2016-06-28: 3 mL via RESPIRATORY_TRACT

## 2016-06-28 NOTE — Assessment & Plan Note (Signed)
Chronic problem.  Excellent control today.  Asymptomatic.  Check labs.  No anticipated med changes.  Will follow. 

## 2016-06-28 NOTE — Patient Instructions (Addendum)
Schedule your complete physical in 6 months We'll notify you of your lab results and make any changes if needed Continue to work on healthy diet and regular exercise- you can do it! STOP SMOKING!!! If your cough or wheezing does not improve, please call Dr Annamaria Boots Call with any questions or concerns Have a great summer!!!

## 2016-06-28 NOTE — Progress Notes (Signed)
   Subjective:    Patient ID: Carol Quinn, female    DOB: 08-24-1955, 61 y.o.   MRN: [id number]  HPI HTN- chronic problem, on Losartan HCTZ w/ good control.  Denies CP, SOB above baseline, HAs, visual changes, edema.  Hyperlipidemia- chronic problem, on Lipitor 20mg  daily.  No abd pain, N/V, myalgias.   Review of Systems For ROS see HPI     Objective:   Physical Exam  Constitutional: She is oriented to person, place, and time. She appears well-developed and well-nourished. No distress.  HENT:  Head: Normocephalic and atraumatic.  Eyes: Conjunctivae and EOM are normal. Pupils are equal, round, and reactive to light.  Neck: Normal range of motion. Neck supple. No thyromegaly present.  Cardiovascular: Normal rate, regular rhythm, normal heart sounds and intact distal pulses.   No murmur heard. Pulmonary/Chest: Effort normal. No respiratory distress. She has wheezes (diffuse inspiratory and expiratory wheezes w/ prolonged expiratory phase- improved s/p neb tx).  + hacking cough  Abdominal: Soft. She exhibits no distension. There is no tenderness.  Musculoskeletal: She exhibits no edema.  Lymphadenopathy:    She has no cervical adenopathy.  Neurological: She is alert and oriented to person, place, and time.  Skin: Skin is warm and dry.  Psychiatric: She has a normal mood and affect. Her behavior is normal.  Vitals reviewed.         Assessment & Plan:

## 2016-06-28 NOTE — Assessment & Plan Note (Signed)
Chronic problem.  Tolerating statin w/o difficulty.  Stressed need for healthy diet and regular exercise.  Check labs.  Adjust meds prn  

## 2016-06-28 NOTE — Assessment & Plan Note (Signed)
Chronic problem.  Pt is wheezing on exam today but wheezing resolved after duoneb tx in office.  No need for abx given complete resolution of sxs but encouraged her to monitor her symptoms and if worsening, call Dr Annamaria Boots.  Pt expressed understanding and is in agreement w/ plan.

## 2016-06-28 NOTE — Progress Notes (Signed)
Pre visit review using our clinic review tool, if applicable. No additional management support is needed unless otherwise documented below in the visit note. 

## 2016-06-29 ENCOUNTER — Encounter: Payer: Self-pay | Admitting: General Practice

## 2016-08-12 ENCOUNTER — Other Ambulatory Visit: Payer: Self-pay | Admitting: Family Medicine

## 2016-08-22 ENCOUNTER — Ambulatory Visit (HOSPITAL_BASED_OUTPATIENT_CLINIC_OR_DEPARTMENT_OTHER)
Admission: RE | Admit: 2016-08-22 | Discharge: 2016-08-22 | Disposition: A | Discharge status: Home / Self Care | Payer: 59 | Source: Ambulatory Visit | Attending: Hematology & Oncology | Admitting: Hematology & Oncology

## 2016-08-22 ENCOUNTER — Telehealth: Payer: Self-pay | Admitting: *Deleted

## 2016-08-22 ENCOUNTER — Ambulatory Visit (HOSPITAL_BASED_OUTPATIENT_CLINIC_OR_DEPARTMENT_OTHER): Payer: 59 | Admitting: Hematology & Oncology

## 2016-08-22 ENCOUNTER — Encounter (HOSPITAL_BASED_OUTPATIENT_CLINIC_OR_DEPARTMENT_OTHER): Payer: Self-pay

## 2016-08-22 ENCOUNTER — Other Ambulatory Visit (HOSPITAL_BASED_OUTPATIENT_CLINIC_OR_DEPARTMENT_OTHER): Payer: 59

## 2016-08-22 VITALS — BP 130/60 | HR 79 | Temp 98.1°F | Resp 19 | Wt 133.0 lb

## 2016-08-22 DIAGNOSIS — C6931 Malignant neoplasm of right choroid: Secondary | ICD-10-CM

## 2016-08-22 DIAGNOSIS — Z85840 Personal history of malignant neoplasm of eye: Secondary | ICD-10-CM

## 2016-08-22 DIAGNOSIS — Z1239 Encounter for other screening for malignant neoplasm of breast: Secondary | ICD-10-CM

## 2016-08-22 DIAGNOSIS — Z1231 Encounter for screening mammogram for malignant neoplasm of breast: Secondary | ICD-10-CM | POA: Diagnosis not present

## 2016-08-22 DIAGNOSIS — J439 Emphysema, unspecified: Secondary | ICD-10-CM | POA: Diagnosis not present

## 2016-08-22 DIAGNOSIS — R635 Abnormal weight gain: Secondary | ICD-10-CM

## 2016-08-22 DIAGNOSIS — R42 Dizziness and giddiness: Secondary | ICD-10-CM

## 2016-08-22 DIAGNOSIS — I700 Atherosclerosis of aorta: Secondary | ICD-10-CM | POA: Diagnosis not present

## 2016-08-22 LAB — CBC WITH DIFFERENTIAL (CANCER CENTER ONLY)
BASO#: 0.1 10*3/uL (ref 0.0–0.2)
BASO%: 0.9 % (ref 0.0–2.0)
EOS ABS: 0.2 10*3/uL (ref 0.0–0.5)
EOS%: 3 % (ref 0.0–7.0)
HEMATOCRIT: 43.7 % (ref 34.8–46.6)
HEMOGLOBIN: 14.8 g/dL (ref 11.6–15.9)
LYMPH#: 1.6 10*3/uL (ref 0.9–3.3)
LYMPH%: 29.1 % (ref 14.0–48.0)
MCH: 33.9 pg (ref 26.0–34.0)
MCHC: 33.9 g/dL (ref 32.0–36.0)
MCV: 100 fL (ref 81–101)
MONO#: 0.6 10*3/uL (ref 0.1–0.9)
MONO%: 10.2 % (ref 0.0–13.0)
NEUT%: 56.8 % (ref 39.6–80.0)
NEUTROS ABS: 3.1 10*3/uL (ref 1.5–6.5)
Platelets: 234 10*3/uL (ref 145–400)
RBC: 4.36 10*6/uL (ref 3.70–5.32)
RDW: 12.4 % (ref 11.1–15.7)
WBC: 5.4 10*3/uL (ref 3.9–10.0)

## 2016-08-22 LAB — CMP (CANCER CENTER ONLY)
ALBUMIN: 3.5 g/dL (ref 3.3–5.5)
ALT(SGPT): 20 U/L (ref 10–47)
AST: 19 U/L (ref 11–38)
Alkaline Phosphatase: 100 U/L — ABNORMAL HIGH (ref 26–84)
BILIRUBIN TOTAL: 0.5 mg/dL (ref 0.20–1.60)
BUN, Bld: 12 mg/dL (ref 7–22)
CALCIUM: 9.2 mg/dL (ref 8.0–10.3)
CO2: 33 meq/L (ref 18–33)
Chloride: 99 mEq/L (ref 98–108)
Creat: 0.8 mg/dl (ref 0.6–1.2)
Glucose, Bld: 92 mg/dL (ref 73–118)
Potassium: 3.8 mEq/L (ref 3.3–4.7)
Sodium: 136 mEq/L (ref 128–145)
TOTAL PROTEIN: 6.4 g/dL (ref 6.4–8.1)

## 2016-08-22 LAB — LACTATE DEHYDROGENASE: LDH: 140 U/L (ref 125–245)

## 2016-08-22 LAB — TSH: TSH: 2.891 m[IU]/L (ref 0.308–3.960)

## 2016-08-22 MED ORDER — IOPAMIDOL (ISOVUE-300) INJECTION 61%
100.0000 mL | Freq: Once | INTRAVENOUS | Status: AC | PRN
  Administered 2016-08-22: 100 mL via INTRAVENOUS

## 2016-08-22 NOTE — Progress Notes (Signed)
Hematology and Oncology Follow Up Visit  Carol Quinn [id number] [birth year] 61 y.o. 08/22/2016   Principle Diagnosis:   Choroidal melanoma of the right eye, status post right eye enucleation  Current Therapy:    Observation     Interim History:  Carol Quinn is back for follow-up. She is doing fairly well. She does feel tired. Patient also states that with her right ocular prosthetic, there is some bleeding. She actually took her prosthetic out. She said there is a rough area on her orbital ridge, she thinks this might be where the bleeding came from. The rash does feel fatigued. We are checking her TSH. This actually was normal.  We did do scans on her. Her CT scan of the chest/abdomen/pelvis all showed no evidence of metastatic melanoma.  She now is complaining of dizziness. This is been going on for a few weeks. I think we will have to get an MRI that we can evaluate this.  She's gained some weight. She is not too happy about this.  She actually has a portable hot tub. I have never heard of one. She actually showed me pictures. I am very impressed by this.    Overall, her performance status is ECOG 1.  Medications:  Current Outpatient Prescriptions:  .  albuterol (PROVENTIL HFA;VENTOLIN HFA) 108 (90 Base) MCG/ACT inhaler, Inhale 2 puffs into the lungs every 6 (six) hours as needed for wheezing or shortness of breath., Disp: 1 Inhaler, Rfl: 12 .  atorvastatin (LIPITOR) 20 MG tablet, TAKE 1 TABLET BY MOUTH ONCE DAILY, Disp: 30 tablet, Rfl: 1 .  budesonide-formoterol (SYMBICORT) 160-4.5 MCG/ACT inhaler, Inhale 2 puffs then rinse mouth, twice daily, Disp: 10.2 g, Rfl: 12 .  buPROPion (WELLBUTRIN XL) 150 MG 24 hr tablet, TAKE 1 TABLET BY MOUTH DAILY, Disp: 30 tablet, Rfl: 6 .  losartan-hydrochlorothiazide (HYZAAR) 100-25 MG tablet, TAKE 1 TABLET BY MOUTH DAILY, Disp: 30 tablet, Rfl: 6 .  modafinil (PROVIGIL) 200 MG tablet, Take 1 tablet (200 mg total) by mouth daily., Disp: 30  tablet, Rfl: 5 .  montelukast (SINGULAIR) 10 MG tablet, Take 1 tablet (10 mg total) by mouth at bedtime., Disp: 30 tablet, Rfl: prn .  omeprazole (PRILOSEC) 40 MG capsule, TAKE 1 CAPSULE BY MOUTH ONCE DAILY, Disp: 30 capsule, Rfl: 6 .  potassium chloride SA (K-DUR,KLOR-CON) 20 MEQ tablet, TAKE 1 TABLET BY MOUTH DAILY, Disp: 30 tablet, Rfl: 6 .  Respiratory Therapy Supplies (FLUTTER) DEVI, Use as directed, Disp: 1 each, Rfl: 0 .  SUMAtriptan (IMITREX) 100 MG tablet, Take one tablet at onset of migraine, repeat in 2 hours if needed., Disp: 10 tablet, Rfl: 6 .  tiotropium (SPIRIVA HANDIHALER) 18 MCG inhalation capsule, INHALE THE CONTENTS OF 1 CAPSULE ONCE DAILY AS DIRECTED., Disp: 30 capsule, Rfl: 12 .  traMADol (ULTRAM) 50 MG tablet, Take 1-2 tablets (50-100 mg total) by mouth 3 (three) times daily as needed., Disp: 30 tablet, Rfl: 0 .  zolmitriptan (ZOMIG) 5 MG nasal solution, Take as directed as needed for migraines , Disp: , Rfl:   Allergies:  Allergies  Allergen Reactions  . Biaxin [Clarithromycin]     Rash   . Codeine   . Hydrocodone-Homatropine     paranoia  . Penicillins   . Topiramate     Other reaction(s): WEIGHT LOSS    Past Medical History, Surgical history, Social history, and Family History were reviewed and updated.  Review of Systems: As above  Physical Exam:  vitals were not taken for  this visit.  Wt Readings from Last 3 Encounters:  06/28/16 133 lb 4 oz (60.4 kg)  04/28/16 129 lb 12.8 oz (58.9 kg)  04/21/16 131 lb 1.9 oz (59.5 kg)     Well-developed and well-nourished white female in no obvious distress. Head and neck exam shows the right ocular prosthetic. This is well fitted. She has some slight ptosis of the right eye. There is good extra ocular muscle movement of the left eye. There is no adenopathy in the neck. Lungs are clear. Cardiac exam regular rate and rhythm with no murmurs, rubs or bruits. Abdomen is soft. She has good bowel sounds per there is no  fluid wave. There is no palpable liver or spleen tip. Back exam shows no tenderness over the spine, ribs or hips. Extremities shows no clubbing, cyanosis or edema. Skin exam shows no rashes, ecchymoses or petechia. Neurological exam shows no focal neurological deficits.  Lab Results  Component Value Date   WBC 5.4 08/22/2016   HGB 14.8 08/22/2016   HCT 43.7 08/22/2016   MCV 100 08/22/2016   PLT 234 08/22/2016     Chemistry      Component Value Date/Time   NA 136 08/22/2016 0931   K 3.8 08/22/2016 0931   CL 99 08/22/2016 0931   CO2 33 08/22/2016 0931   BUN 12 08/22/2016 0931   CREATININE 0.8 08/22/2016 0931      Component Value Date/Time   CALCIUM 9.2 08/22/2016 0931   ALKPHOS 100 (H) 08/22/2016 0931   AST 19 08/22/2016 0931   ALT 20 08/22/2016 0931   BILITOT 0.50 08/22/2016 0931         Impression and Plan: Carol Quinn is a 62 year old white female. She has a choroidal melanoma of the right eye. She underwent right eye enucleation. She had the enucleation back in October 2017.  We will have to be aggressive with follow-up. I think that surveillance will have to be often. We will have to get scans on her. I do not like the fact that she is at a higher risk for recurrence.  I want to see her back in 4 weeks. I just want to follow up with her quickly so that I can be assured that everything is okay.  If all is good 4 weeks, then we will get her back for a regular appointment in the fall.  [name], MD 8/13/201810:46 AM

## 2016-08-22 NOTE — Telephone Encounter (Signed)
-----   Message from [name], MD sent at 08/22/2016 12:52 PM EDT ----- Call - No thyroid problem!!! pete

## 2016-08-27 ENCOUNTER — Ambulatory Visit (HOSPITAL_BASED_OUTPATIENT_CLINIC_OR_DEPARTMENT_OTHER)
Admission: RE | Admit: 2016-08-27 | Discharge: 2016-08-27 | Disposition: A | Discharge status: Home / Self Care | Payer: 59 | Source: Ambulatory Visit | Attending: Hematology & Oncology | Admitting: Hematology & Oncology

## 2016-08-27 DIAGNOSIS — R635 Abnormal weight gain: Secondary | ICD-10-CM | POA: Insufficient documentation

## 2016-08-27 DIAGNOSIS — R42 Dizziness and giddiness: Secondary | ICD-10-CM | POA: Diagnosis present

## 2016-08-27 MED ORDER — GADOBENATE DIMEGLUMINE 529 MG/ML IV SOLN
10.0000 mL | Freq: Once | INTRAVENOUS | Status: AC | PRN
  Administered 2016-08-27: 10 mL via INTRAVENOUS

## 2016-08-29 ENCOUNTER — Telehealth: Payer: Self-pay | Admitting: *Deleted

## 2016-08-29 NOTE — Telephone Encounter (Addendum)
Patient is aware of results  ----- Message from [name], MD sent at 08/29/2016  6:35 AM EDT ----- Call - MRI of the brain looks great!!!  Carol Quinn

## 2016-09-07 ENCOUNTER — Other Ambulatory Visit: Payer: Self-pay | Admitting: Family Medicine

## 2016-09-22 ENCOUNTER — Other Ambulatory Visit: Payer: Self-pay | Admitting: *Deleted

## 2016-09-22 DIAGNOSIS — C6931 Malignant neoplasm of right choroid: Secondary | ICD-10-CM

## 2016-09-23 ENCOUNTER — Other Ambulatory Visit (HOSPITAL_BASED_OUTPATIENT_CLINIC_OR_DEPARTMENT_OTHER): Payer: 59

## 2016-09-23 ENCOUNTER — Encounter: Payer: Self-pay | Admitting: Hematology & Oncology

## 2016-09-23 ENCOUNTER — Ambulatory Visit (HOSPITAL_BASED_OUTPATIENT_CLINIC_OR_DEPARTMENT_OTHER): Payer: 59 | Admitting: Hematology & Oncology

## 2016-09-23 VITALS — BP 101/65 | HR 88 | Temp 98.1°F | Resp 19 | Wt 135.0 lb

## 2016-09-23 DIAGNOSIS — D4620 Refractory anemia with excess of blasts, unspecified: Secondary | ICD-10-CM

## 2016-09-23 DIAGNOSIS — R42 Dizziness and giddiness: Secondary | ICD-10-CM | POA: Diagnosis not present

## 2016-09-23 DIAGNOSIS — Z87891 Personal history of nicotine dependence: Secondary | ICD-10-CM | POA: Diagnosis not present

## 2016-09-23 DIAGNOSIS — Z85840 Personal history of malignant neoplasm of eye: Secondary | ICD-10-CM

## 2016-09-23 DIAGNOSIS — C6931 Malignant neoplasm of right choroid: Secondary | ICD-10-CM

## 2016-09-23 DIAGNOSIS — D46Z Other myelodysplastic syndromes: Secondary | ICD-10-CM

## 2016-09-23 DIAGNOSIS — R449 Unspecified symptoms and signs involving general sensations and perceptions: Secondary | ICD-10-CM | POA: Diagnosis not present

## 2016-09-23 HISTORY — DX: Other myelodysplastic syndromes: D46.Z

## 2016-09-23 HISTORY — DX: Refractory anemia with excess of blasts, unspecified: D46.20

## 2016-09-23 LAB — CMP (CANCER CENTER ONLY)
ALT(SGPT): 34 U/L (ref 10–47)
AST: 28 U/L (ref 11–38)
Albumin: 3.7 g/dL (ref 3.3–5.5)
Alkaline Phosphatase: 108 U/L — ABNORMAL HIGH (ref 26–84)
BILIRUBIN TOTAL: 0.7 mg/dL (ref 0.20–1.60)
BUN: 16 mg/dL (ref 7–22)
CHLORIDE: 99 meq/L (ref 98–108)
CO2: 32 meq/L (ref 18–33)
CREATININE: 0.8 mg/dL (ref 0.6–1.2)
Calcium: 9.6 mg/dL (ref 8.0–10.3)
GLUCOSE: 104 mg/dL (ref 73–118)
Potassium: 4.5 mEq/L (ref 3.3–4.7)
SODIUM: 146 meq/L — AB (ref 128–145)
Total Protein: 6.8 g/dL (ref 6.4–8.1)

## 2016-09-23 LAB — CBC WITH DIFFERENTIAL (CANCER CENTER ONLY)
BASO#: 0.1 10*3/uL (ref 0.0–0.2)
BASO%: 0.8 % (ref 0.0–2.0)
EOS%: 2.4 % (ref 0.0–7.0)
Eosinophils Absolute: 0.2 10*3/uL (ref 0.0–0.5)
HCT: 46.4 % (ref 34.8–46.6)
HGB: 15.4 g/dL (ref 11.6–15.9)
LYMPH#: 1.8 10*3/uL (ref 0.9–3.3)
LYMPH%: 27.6 % (ref 14.0–48.0)
MCH: 33.3 pg (ref 26.0–34.0)
MCHC: 33.2 g/dL (ref 32.0–36.0)
MCV: 100 fL (ref 81–101)
MONO#: 0.6 10*3/uL (ref 0.1–0.9)
MONO%: 8.7 % (ref 0.0–13.0)
NEUT#: 3.9 10*3/uL (ref 1.5–6.5)
NEUT%: 60.5 % (ref 39.6–80.0)
PLATELETS: 282 10*3/uL (ref 145–400)
RBC: 4.63 10*6/uL (ref 3.70–5.32)
RDW: 12 % (ref 11.1–15.7)
WBC: 6.4 10*3/uL (ref 3.9–10.0)

## 2016-09-23 LAB — LACTATE DEHYDROGENASE: LDH: 158 U/L (ref 125–245)

## 2016-09-23 NOTE — Progress Notes (Signed)
Hematology and Oncology Follow Up Visit  ARA GRANDMAISON [id number] 09/28/1955 61 y.o. 09/23/2016   Principle Diagnosis:   Choroidal melanoma of the right eye, status post right eye enucleation  COPD-chronic bronchitis  Current Therapy:    Observation     Interim History:  Ms. Klinger is back for follow-up. She is doing a little bit better. She still has some occasional dizziness. I'm not sure as to why she has this. We did go ahead and do an MRI of the brain. This was done on August 18. Thankfully, there is no evidence of CNS metastases.  Is possible that she may have to see neurology. I don't know if this may have something to do with her having a right ocular prosthetic and maybe having some visual and focus dysfunction.   Her complaint now is that she has some point tenderness on the anterior right lower rib cage. This is along the costal margin of the rib cage. There is no swelling. There is no erythema noted.  We have not done a bone scan on her. I know that she has had a recent CT scans which have been negative.   She has a good appetite. There is no nausea or vomiting. She's had no cough. She's had no fever. There's been no bleeding.   Currently, her performance status is ECOG 1.   She does have pretty significant underlying COPD secondary to past tobacco use. She does use nebulizers.   Overall, her performance status is ECOG 1.  Medications:  Current Outpatient Prescriptions:  .  albuterol (PROVENTIL HFA;VENTOLIN HFA) 108 (90 Base) MCG/ACT inhaler, Inhale 2 puffs into the lungs every 6 (six) hours as needed for wheezing or shortness of breath., Disp: 1 Inhaler, Rfl: 12 .  atorvastatin (LIPITOR) 20 MG tablet, TAKE 1 TABLET BY MOUTH ONCE DAILY, Disp: 30 tablet, Rfl: 6 .  budesonide-formoterol (SYMBICORT) 160-4.5 MCG/ACT inhaler, Inhale 2 puffs then rinse mouth, twice daily, Disp: 10.2 g, Rfl: 12 .  buPROPion (WELLBUTRIN XL) 150 MG 24 hr tablet, TAKE 1 TABLET BY MOUTH  DAILY, Disp: 30 tablet, Rfl: 6 .  losartan-hydrochlorothiazide (HYZAAR) 100-25 MG tablet, TAKE 1 TABLET BY MOUTH DAILY, Disp: 30 tablet, Rfl: 6 .  modafinil (PROVIGIL) 200 MG tablet, Take 1 tablet (200 mg total) by mouth daily., Disp: 30 tablet, Rfl: 5 .  montelukast (SINGULAIR) 10 MG tablet, Take 1 tablet (10 mg total) by mouth at bedtime., Disp: 30 tablet, Rfl: prn .  omeprazole (PRILOSEC) 40 MG capsule, TAKE 1 CAPSULE BY MOUTH ONCE DAILY, Disp: 30 capsule, Rfl: 6 .  potassium chloride SA (K-DUR,KLOR-CON) 20 MEQ tablet, TAKE 1 TABLET BY MOUTH DAILY, Disp: 30 tablet, Rfl: 6 .  PREVIDENT 5000 PLUS 1.1 % CREA dental cream, , Disp: , Rfl:  .  Respiratory Therapy Supplies (FLUTTER) DEVI, Use as directed, Disp: 1 each, Rfl: 0 .  SUMAtriptan (IMITREX) 100 MG tablet, Take one tablet at onset of migraine, repeat in 2 hours if needed., Disp: 10 tablet, Rfl: 6 .  tiotropium (SPIRIVA HANDIHALER) 18 MCG inhalation capsule, INHALE THE CONTENTS OF 1 CAPSULE ONCE DAILY AS DIRECTED., Disp: 30 capsule, Rfl: 12 .  traMADol (ULTRAM) 50 MG tablet, Take 1-2 tablets (50-100 mg total) by mouth 3 (three) times daily as needed., Disp: 30 tablet, Rfl: 0 .  zolmitriptan (ZOMIG) 5 MG nasal solution, Take as directed as needed for migraines , Disp: , Rfl:   Allergies:  Allergies  Allergen Reactions  . Biaxin [Clarithromycin]  Rash   . Codeine   . Hydrocodone-Homatropine     paranoia  . Penicillins   . Topiramate     Other reaction(s): WEIGHT LOSS    Past Medical History, Surgical history, Social history, and Family History were reviewed and updated.  Review of Systems: As stated in the interim history  Physical Exam:  weight is 135 lb (61.2 kg). Her oral temperature is 98.1 F (36.7 C). Her blood pressure is 101/65 and her pulse is 88. Her respiration is 19 and oxygen saturation is 99%.   Wt Readings from Last 3 Encounters:  09/23/16 135 lb (61.2 kg)  08/22/16 133 lb (60.3 kg)  06/28/16 133 lb 4 oz  (60.4 kg)     Well-developed and well-nourished white female. Her head and neck exam shows the right ocular prosthetic. Her left eye shows good pupillary reflex. She has no adenopathy in the neck. Lungs are clear bilaterally. She has no rales, wheezes or rhonchi. Cardiac exam shows a regular rate and rhythm. She has no murmurs, rubs or bruits. Chest wall exam does show some spot tenderness along the lower costal margin in the right rib cage. There is no erythema or warmth in this area. Abdomen shows no hepatomegaly. She has no fluid wave. There is no guarding or rebound tenderness. Extremities shows no clubbing, cyanosis or edema. Skin exam shows no rashes, ecchymoses or petechia. .  Lab Results  Component Value Date   WBC 6.4 09/23/2016   HGB 15.4 09/23/2016   HCT 46.4 09/23/2016   MCV 100 09/23/2016   PLT 282 09/23/2016     Chemistry      Component Value Date/Time   NA 146 (H) 09/23/2016 1116   K 4.5 09/23/2016 1116   CL 99 09/23/2016 1116   CO2 32 09/23/2016 1116   BUN 16 09/23/2016 1116   CREATININE 0.8 09/23/2016 1116      Component Value Date/Time   CALCIUM 9.6 09/23/2016 1116   ALKPHOS 108 (H) 09/23/2016 1116   AST 28 09/23/2016 1116   ALT 34 09/23/2016 1116   BILITOT 0.70 09/23/2016 1116         Impression and Plan: Ms. Banko is a 61 year old white female. She has a choroidal melanoma of the right eye. She underwent right eye enucleation. She had the enucleation back in October 2017.  We will go ahead and get a bone scan on her. I think this would be a good idea. Again, with ocular melanoma, this can metastasize to any part of the body. She definitely is at higher risk for this.  If the bone scan looks fine, then we will see her back in 3 months. We will get her surveillance CT scan we see her back. If this next set of surveillance scans look okay, then we will move her appointments out to every 4 months.  I spent about 25-30 minutes with she and her husband. I  reviewed the labs and x-rays with her.       [name], MD 9/14/201812:09 PM

## 2016-10-03 ENCOUNTER — Telehealth: Payer: Self-pay | Admitting: Internal Medicine

## 2016-10-03 MED ORDER — PREDNISONE 10 MG PO TABS: ORAL_TABLET | ORAL | 0 refills | Status: DC

## 2016-10-03 MED ORDER — DOXYCYCLINE HYCLATE 100 MG PO TABS: 100.0000 mg | ORAL_TABLET | Freq: Two times a day (BID) | ORAL | 0 refills | Status: DC

## 2016-10-03 MED ORDER — TRAMADOL HCL 50 MG PO TABS: 50.0000 mg | ORAL_TABLET | Freq: Three times a day (TID) | ORAL | 0 refills | Status: DC | PRN

## 2016-10-03 NOTE — Telephone Encounter (Signed)
Spoke with pt.  She c/o Headache, prod cough (Green), chest congestion, PND, sorethroat, increased sob.  Denies fever.  PT states that it normally takes 2 rounds of abx to clear this.  Pt is requesting 2 weeks of Doxycycline along with pred taper and tramadol for cough.  Please advise.  Allergies  Allergen Reactions  . Biaxin [Clarithromycin]     Rash   . Codeine   . Hydrocodone-Homatropine     paranoia  . Penicillins   . Topiramate     Other reaction(s): WEIGHT LOSS    Current Outpatient Prescriptions on File Prior to Visit  Medication Sig Dispense Refill  . albuterol (PROVENTIL HFA;VENTOLIN HFA) 108 (90 Base) MCG/ACT inhaler Inhale 2 puffs into the lungs every 6 (six) hours as needed for wheezing or shortness of breath. 1 Inhaler 12  . atorvastatin (LIPITOR) 20 MG tablet TAKE 1 TABLET BY MOUTH ONCE DAILY 30 tablet 6  . budesonide-formoterol (SYMBICORT) 160-4.5 MCG/ACT inhaler Inhale 2 puffs then rinse mouth, twice daily 10.2 g 12  . buPROPion (WELLBUTRIN XL) 150 MG 24 hr tablet TAKE 1 TABLET BY MOUTH DAILY 30 tablet 6  . losartan-hydrochlorothiazide (HYZAAR) 100-25 MG tablet TAKE 1 TABLET BY MOUTH DAILY 30 tablet 6  . modafinil (PROVIGIL) 200 MG tablet Take 1 tablet (200 mg total) by mouth daily. 30 tablet 5  . montelukast (SINGULAIR) 10 MG tablet Take 1 tablet (10 mg total) by mouth at bedtime. 30 tablet prn  . omeprazole (PRILOSEC) 40 MG capsule TAKE 1 CAPSULE BY MOUTH ONCE DAILY 30 capsule 6  . potassium chloride SA (K-DUR,KLOR-CON) 20 MEQ tablet TAKE 1 TABLET BY MOUTH DAILY 30 tablet 6  . PREVIDENT 5000 PLUS 1.1 % CREA dental cream     . Respiratory Therapy Supplies (FLUTTER) DEVI Use as directed 1 each 0  . SUMAtriptan (IMITREX) 100 MG tablet Take one tablet at onset of migraine, repeat in 2 hours if needed. 10 tablet 6  . tiotropium (SPIRIVA HANDIHALER) 18 MCG inhalation capsule INHALE THE CONTENTS OF 1 CAPSULE ONCE DAILY AS DIRECTED. 30 capsule 12  . traMADol (ULTRAM) 50 MG  tablet Take 1-2 tablets (50-100 mg total) by mouth 3 (three) times daily as needed. 30 tablet 0  . zolmitriptan (ZOMIG) 5 MG nasal solution Take as directed as needed for migraines      No current facility-administered medications on file prior to visit.

## 2016-10-03 NOTE — Telephone Encounter (Signed)
I faxed it in because CY had to sign Rx. Pt advised and nothing further is needed.

## 2016-10-03 NOTE — Telephone Encounter (Signed)
Ok doxycycline 100 mg, # 28     1 twice daily x 2 weeks        Prednisone 10 mg, # 20, 4 X 2 DAYS, 3 X 2 DAYS, 2 X 2 DAYS, 1 X 2 DAYS        Tramadol 50 mg, # 50     1-2 tabs every 8 hours if needed for cough

## 2016-10-03 NOTE — Telephone Encounter (Signed)
Spoke with pt, advised message from Dr. Annamaria Boots. I will send in Rx's and have CY sign for Tramadol

## 2016-10-07 ENCOUNTER — Ambulatory Visit (HOSPITAL_COMMUNITY)
Admission: RE | Admit: 2016-10-07 | Discharge: 2016-10-07 | Disposition: A | Discharge status: Home / Self Care | Payer: 59 | Source: Ambulatory Visit | Attending: Hematology & Oncology | Admitting: Hematology & Oncology

## 2016-10-07 ENCOUNTER — Encounter (HOSPITAL_COMMUNITY)
Admission: RE | Admit: 2016-10-07 | Discharge: 2016-10-07 | Disposition: A | Discharge status: Home / Self Care | Payer: 59 | Source: Ambulatory Visit | Attending: Hematology & Oncology | Admitting: Hematology & Oncology

## 2016-10-07 DIAGNOSIS — C6931 Malignant neoplasm of right choroid: Secondary | ICD-10-CM | POA: Insufficient documentation

## 2016-10-07 MED ORDER — TECHNETIUM TC 99M MEDRONATE IV KIT
20.0000 | PACK | Freq: Once | INTRAVENOUS | Status: AC | PRN
  Administered 2016-10-07: 20 via INTRAVENOUS

## 2016-10-10 ENCOUNTER — Telehealth: Payer: Self-pay | Admitting: *Deleted

## 2016-10-10 NOTE — Telephone Encounter (Addendum)
Patient is aware of resutls  ----- Message from [name], MD sent at 10/07/2016  3:58 PM EDT ----- Call - the bone scan does NOT show any cancer!!!  Carol Quinn

## 2016-10-28 ENCOUNTER — Ambulatory Visit (INDEPENDENT_AMBULATORY_CARE_PROVIDER_SITE_OTHER): Payer: 59 | Admitting: Internal Medicine

## 2016-10-28 ENCOUNTER — Encounter: Payer: Self-pay | Admitting: Internal Medicine

## 2016-10-28 DIAGNOSIS — J9611 Chronic respiratory failure with hypoxia: Secondary | ICD-10-CM

## 2016-10-28 DIAGNOSIS — J441 Chronic obstructive pulmonary disease with (acute) exacerbation: Secondary | ICD-10-CM | POA: Diagnosis not present

## 2016-10-28 DIAGNOSIS — Z23 Encounter for immunization: Secondary | ICD-10-CM | POA: Diagnosis not present

## 2016-10-28 DIAGNOSIS — Z720 Tobacco use: Secondary | ICD-10-CM | POA: Diagnosis not present

## 2016-10-28 MED ORDER — GLYCOPYRROLATE-FORMOTEROL 9-4.8 MCG/ACT IN AERO: 2.0000 | INHALATION_SPRAY | Freq: Two times a day (BID) | RESPIRATORY_TRACT | 12 refills | Status: DC

## 2016-10-28 MED ORDER — GLYCOPYRROLATE-FORMOTEROL 9-4.8 MCG/ACT IN AERO: 2.0000 | INHALATION_SPRAY | Freq: Two times a day (BID) | RESPIRATORY_TRACT | 0 refills | Status: DC

## 2016-10-28 MED ORDER — VARENICLINE TARTRATE 0.5 MG X 11 & 1 MG X 42 PO MISC: ORAL | 1 refills | Status: DC

## 2016-10-28 NOTE — Patient Instructions (Addendum)
Flu vax standard  We suggested you get the Pneumovax-23 repeated next year, since that will be 5 years since your last time.  Sample and print script for Bevespi inhaler     Inhale 2 puffs, twice daily   Try this instead of Symbicort and Spiriva. If  It works well you can get the script filled. If you prefer, you can go back to Symbicort and Spiriva.  Script printed for tramadol to use if needed  Script printed for Chantix  Please call as needed

## 2016-10-28 NOTE — Assessment & Plan Note (Signed)
Slowly resolving exacerbation which became nonpurulent after treatment with Z-Pak and prednisone in late September. Plan- Flu vax, Try replacing Symbicort/ Spiriva with [employer]

## 2016-10-28 NOTE — Assessment & Plan Note (Signed)
Oxygen therapy continues appropriate.  We discussed use of portable oxygen.

## 2016-10-28 NOTE — Assessment & Plan Note (Signed)
We discussed her smoking.  She was able to stop while on Chantix previously and is willing to try again as discussed.

## 2016-10-28 NOTE — Progress Notes (Signed)
Patient ID: Carol Quinn, female    DOB: 03/04/1955, 61 y.o.   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 Hopefully that will do well. Continues oxygen 2 L for sleep and as needed for exertion/APS. Has POC. Recent allergic rhinitis symptoms and over the last 3 weeks has had some increasing cough with scant green sputum Unfortunately she continues to smoke a little and encouraged to make every effort to stop. She has been given Chantix. She asks prednisone to hold CT-chest 04/21/16- Chest Impression: I reviewed images with her noting what appears to be chronic biapical scarring which we will want to watch. No  evidence of thoracic metastasis. Abdomen / Pelvis Impression: No evidence of metastatic disease in the abdomen pelvis. 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 Pt is having alot of wheezing as well.  Doxy, tramadol and pred given 9/24. O2 2l sleep and prn/ APS She continues close follow-up by oncology with imaging surveillance.  No metastatic disease identified. Since bronchitis exacerbation in late September, she has had persistent cough productive of white sputum with no fever.  Nebulizer and tramadol have been very helpful.  She continues on oxygen 2 L for sleep and carries her portable most of the time in her car. She would like flu shot at this visit.  Her last pneumonia vaccine was 4 years ago when I suggested she repeat Pneumovax next year, waiting on Prevnar until she is 73 or otherwise directed.  Her lung disease is bad enough that earlier Prevnar may be indicated. CT chest 08/22/16 IMPRESSION: 1. No evidence of metastatic disease. 2.  Aortic atherosclerosis (ICD10-170.0). 3.  Emphysema (ICD10-J43.9).  ROS-see HPI   + = positive Constitutional:   No-   weight loss, night sweats, fevers, chills, +fatigue, lassitude. HEENT:   No-  headaches, difficulty swallowing, tooth/dental problems, sore throat,       No-  sneezing, itching, ear ache, nasal congestion, +post nasal drip,  CV:  No-   chest pain, orthopnea, PND, swelling in lower extremities, anasarca, dizziness, palpitations Resp: + shortness of breath with exertion or at rest.               + productive cough,  + non-productive cough,  No- coughing up of blood.               change in color of mucus.  +  wheezing.   Skin: No-   rash or lesions. GI:  No-   heartburn, indigestion, abdominal pain, nausea, vomiting,  GU:  MS:  No-   joint pain or swelling.   Neuro-     nothing unusual Psych:  No- change in mood or affect. No depression or anxiety.  No memory  loss.  OBJ- Physical Exam   General- Alert, Oriented, Affect-appropriate,   Skin- clear  Lymphadenopathy- none Head- atraumatic            Eyes- + R prosthesis            Ears- Hearing, canals-normal            Nose- clear, no-Septal dev, mucus, polyps, erosion, perforation             Throat- Mallampati IV , mucosa clear/ not red , drainage- none, tonsils- atrophic Neck- flexible , trachea midline, no stridor , thyroid nl, carotid no bruit Chest - symmetrical excursion , unlabored           Heart/CV- RRR , no murmur , no gallop  , no rub, nl s1 s2                           - JVD- none , edema- none, stasis changes- none, varices- none           Lung-  wheeze and, cough-none , dullness-none, rub- none,                                + distant breath sounds           Chest wall-  Abd-  Br/ Gen/ Rectal- Not done, not indicated Extrem- cyanosis- none, clubbing, none, atrophy- none, strength- nl Neuro- grossly intact to observation

## 2016-11-10 ENCOUNTER — Other Ambulatory Visit: Payer: Self-pay | Admitting: Internal Medicine

## 2016-11-10 NOTE — Telephone Encounter (Signed)
Ok to refill total 6 months 

## 2016-11-10 NOTE — Telephone Encounter (Signed)
CY Please advise on refill. Thanks.  

## 2016-11-30 ENCOUNTER — Encounter: Payer: Self-pay | Admitting: Internal Medicine

## 2016-12-23 ENCOUNTER — Ambulatory Visit (HOSPITAL_BASED_OUTPATIENT_CLINIC_OR_DEPARTMENT_OTHER): Payer: 59 | Admitting: Hematology & Oncology

## 2016-12-23 ENCOUNTER — Ambulatory Visit (HOSPITAL_BASED_OUTPATIENT_CLINIC_OR_DEPARTMENT_OTHER)
Admission: RE | Admit: 2016-12-23 | Discharge: 2016-12-23 | Disposition: A | Discharge status: Home / Self Care | Payer: 59 | Source: Ambulatory Visit | Attending: Hematology & Oncology | Admitting: Hematology & Oncology

## 2016-12-23 ENCOUNTER — Encounter: Payer: Self-pay | Admitting: Hematology & Oncology

## 2016-12-23 ENCOUNTER — Other Ambulatory Visit (HOSPITAL_BASED_OUTPATIENT_CLINIC_OR_DEPARTMENT_OTHER): Payer: 59

## 2016-12-23 ENCOUNTER — Telehealth: Payer: Self-pay | Admitting: *Deleted

## 2016-12-23 ENCOUNTER — Other Ambulatory Visit: Payer: Self-pay

## 2016-12-23 VITALS — BP 114/71 | HR 78 | Temp 97.6°F | Resp 18 | Wt 136.0 lb

## 2016-12-23 DIAGNOSIS — J432 Centrilobular emphysema: Secondary | ICD-10-CM | POA: Diagnosis not present

## 2016-12-23 DIAGNOSIS — I700 Atherosclerosis of aorta: Secondary | ICD-10-CM | POA: Insufficient documentation

## 2016-12-23 DIAGNOSIS — C6931 Malignant neoplasm of right choroid: Secondary | ICD-10-CM

## 2016-12-23 DIAGNOSIS — K7689 Other specified diseases of liver: Secondary | ICD-10-CM | POA: Insufficient documentation

## 2016-12-23 DIAGNOSIS — M47816 Spondylosis without myelopathy or radiculopathy, lumbar region: Secondary | ICD-10-CM | POA: Insufficient documentation

## 2016-12-23 DIAGNOSIS — M47892 Other spondylosis, cervical region: Secondary | ICD-10-CM | POA: Insufficient documentation

## 2016-12-23 DIAGNOSIS — M5126 Other intervertebral disc displacement, lumbar region: Secondary | ICD-10-CM | POA: Diagnosis not present

## 2016-12-23 DIAGNOSIS — M5136 Other intervertebral disc degeneration, lumbar region: Secondary | ICD-10-CM | POA: Diagnosis not present

## 2016-12-23 DIAGNOSIS — Z85840 Personal history of malignant neoplasm of eye: Secondary | ICD-10-CM

## 2016-12-23 DIAGNOSIS — J449 Chronic obstructive pulmonary disease, unspecified: Secondary | ICD-10-CM

## 2016-12-23 LAB — CMP (CANCER CENTER ONLY)
ALT(SGPT): 26 U/L (ref 10–47)
AST: 20 U/L (ref 11–38)
Albumin: 3.6 g/dL (ref 3.3–5.5)
Alkaline Phosphatase: 99 U/L — ABNORMAL HIGH (ref 26–84)
BUN, Bld: 10 mg/dL (ref 7–22)
CALCIUM: 9.4 mg/dL (ref 8.0–10.3)
CO2: 31 meq/L (ref 18–33)
Chloride: 99 mEq/L (ref 98–108)
Creat: 1 mg/dl (ref 0.6–1.2)
GLUCOSE: 89 mg/dL (ref 73–118)
POTASSIUM: 4.3 meq/L (ref 3.3–4.7)
Sodium: 143 mEq/L (ref 128–145)
Total Bilirubin: 0.5 mg/dl (ref 0.20–1.60)
Total Protein: 6.3 g/dL — ABNORMAL LOW (ref 6.4–8.1)

## 2016-12-23 LAB — CBC WITH DIFFERENTIAL (CANCER CENTER ONLY)
BASO#: 0.1 10*3/uL (ref 0.0–0.2)
BASO%: 1 % (ref 0.0–2.0)
EOS%: 4.3 % (ref 0.0–7.0)
Eosinophils Absolute: 0.3 10*3/uL (ref 0.0–0.5)
HEMATOCRIT: 42.4 % (ref 34.8–46.6)
HGB: 14.3 g/dL (ref 11.6–15.9)
LYMPH#: 1.8 10*3/uL (ref 0.9–3.3)
LYMPH%: 28.2 % (ref 14.0–48.0)
MCH: 32.9 pg (ref 26.0–34.0)
MCHC: 33.7 g/dL (ref 32.0–36.0)
MCV: 98 fL (ref 81–101)
MONO#: 0.7 10*3/uL (ref 0.1–0.9)
MONO%: 11.1 % (ref 0.0–13.0)
NEUT#: 3.5 10*3/uL (ref 1.5–6.5)
NEUT%: 55.4 % (ref 39.6–80.0)
Platelets: 263 10*3/uL (ref 145–400)
RBC: 4.35 10*6/uL (ref 3.70–5.32)
RDW: 12.5 % (ref 11.1–15.7)
WBC: 6.3 10*3/uL (ref 3.9–10.0)

## 2016-12-23 LAB — LACTATE DEHYDROGENASE: LDH: 161 U/L (ref 125–245)

## 2016-12-23 MED ORDER — IOPAMIDOL (ISOVUE-300) INJECTION 61%
100.0000 mL | Freq: Once | INTRAVENOUS | Status: AC | PRN
  Administered 2016-12-23: 100 mL via INTRAVENOUS

## 2016-12-23 NOTE — Progress Notes (Signed)
Hematology and Oncology Follow Up Visit  Carol Quinn [id number] [birth year] 61 y.o. 12/23/2016   Principle Diagnosis:   Choroidal melanoma of the right eye, status post right eye enucleation  COPD-chronic bronchitis  Current Therapy:    Observation     Interim History:  Carol Quinn is back for follow-up.  She is doing pretty well.  We saw her about 3 months ago.  She did have a CT scan done today.  Unfortunately I do not have the result back.  I did look at the CT scan myself.  I do not see anything that was obvious for recurrent disease.  Her main problem is her lungs.  She feels like she might be coming down with bronchitis.  She does have a pulmonologist that she will be able to call.  She has had no fever.  She has had no nausea or vomiting.  She does feel little bit nauseated today because of the oral contrast that she had to drink.  She has had no diarrhea.  She has had no bleeding.  She has had no rashes.  There is been no leg swelling.  Overall, her performance status is ECOG 1.  Medications:  Current Outpatient Medications:  .  estradiol (VIVELLE-DOT) 0.1 MG/24HR patch, Place onto the skin., Disp: , Rfl:  .  albuterol (PROVENTIL HFA;VENTOLIN HFA) 108 (90 Base) MCG/ACT inhaler, Inhale 2 puffs into the lungs every 6 (six) hours as needed for wheezing or shortness of breath., Disp: 1 Inhaler, Rfl: 12 .  atorvastatin (LIPITOR) 20 MG tablet, TAKE 1 TABLET BY MOUTH ONCE DAILY, Disp: 30 tablet, Rfl: 6 .  budesonide-formoterol (SYMBICORT) 160-4.5 MCG/ACT inhaler, Inhale 2 puffs then rinse mouth, twice daily, Disp: 10.2 g, Rfl: 12 .  buPROPion (WELLBUTRIN XL) 150 MG 24 hr tablet, TAKE 1 TABLET BY MOUTH DAILY, Disp: 30 tablet, Rfl: 6 .  Glycopyrrolate-Formoterol (BEVESPI AEROSPHERE) 9-4.8 MCG/ACT AERO, Inhale 2 puffs into the lungs 2 (two) times daily., Disp: 1 Inhaler, Rfl: 12 .  Glycopyrrolate-Formoterol (BEVESPI AEROSPHERE) 9-4.8 MCG/ACT AERO, Inhale 2 puffs into the lungs 2  (two) times daily., Disp: 1 Inhaler, Rfl: 0 .  losartan-hydrochlorothiazide (HYZAAR) 100-25 MG tablet, TAKE 1 TABLET BY MOUTH DAILY, Disp: 30 tablet, Rfl: 6 .  modafinil (PROVIGIL) 200 MG tablet, TAKE ONE TABLET BY MOUTH DAILY, Disp: 30 tablet, Rfl: 5 .  montelukast (SINGULAIR) 10 MG tablet, Take 1 tablet (10 mg total) by mouth at bedtime., Disp: 30 tablet, Rfl: prn .  omeprazole (PRILOSEC) 40 MG capsule, TAKE 1 CAPSULE BY MOUTH ONCE DAILY, Disp: 30 capsule, Rfl: 6 .  potassium chloride SA (K-DUR,KLOR-CON) 20 MEQ tablet, TAKE 1 TABLET BY MOUTH DAILY, Disp: 30 tablet, Rfl: 6 .  PREVIDENT 5000 PLUS 1.1 % CREA dental cream, , Disp: , Rfl:  .  Respiratory Therapy Supplies (FLUTTER) DEVI, Use as directed, Disp: 1 each, Rfl: 0 .  SUMAtriptan (IMITREX) 100 MG tablet, Take one tablet at onset of migraine, repeat in 2 hours if needed., Disp: 10 tablet, Rfl: 6 .  tiotropium (SPIRIVA HANDIHALER) 18 MCG inhalation capsule, INHALE THE CONTENTS OF 1 CAPSULE ONCE DAILY AS DIRECTED., Disp: 30 capsule, Rfl: 12 .  varenicline (CHANTIX PAK) 0.5 MG X 11 & 1 MG X 42 tablet, Take one 0.5 mg tablet by mouth once daily for 3 days, then increase to one 0.5 mg tablet twice daily for 4 days, then increase to one 1 mg tablet twice daily., Disp: 53 tablet, Rfl: 1 .  zolmitriptan (ZOMIG)  5 MG nasal solution, Take as directed as needed for migraines , Disp: , Rfl:   Allergies:  Allergies  Allergen Reactions  . Clarithromycin Diarrhea    Rash  Severe diarrhea   . Codeine Itching  . Hydrocodone-Homatropine     paranoia  . Hydrocodone-Homatropine Other (See Comments)    paranoia  . Penicillins Other (See Comments)    Unknown reaction, childhood allergy  . Topiramate Other (See Comments)    Other reaction(s): WEIGHT LOSS WEIGHT LOSS    Past Medical History, Surgical history, Social history, and Family History were reviewed and updated.  Review of Systems: As stated in the interim history  Physical Exam:  weight  is 136 lb (61.7 kg). Her oral temperature is 97.6 F (36.4 C). Her blood pressure is 114/71 and her pulse is 78. Her respiration is 18 and oxygen saturation is 99%.   Wt Readings from Last 3 Encounters:  12/23/16 136 lb (61.7 kg)  10/28/16 135 lb 6.4 oz (61.4 kg)  09/23/16 135 lb (61.2 kg)     I examined Carol Quinn.  The findings on my examination are noted below:   Well-developed and well-nourished white female. Her head and neck exam shows the right ocular prosthetic. Her left eye shows good pupillary reflex. She has no adenopathy in the neck. Lungs are clear bilaterally. She has no rales, wheezes or rhonchi. Cardiac exam shows a regular rate and rhythm. She has no murmurs, rubs or bruits. Chest wall exam does show some spot tenderness along the lower costal margin in the right rib cage. There is no erythema or warmth in this area. Abdomen shows no hepatomegaly. She has no fluid wave. There is no guarding or rebound tenderness. Extremities shows no clubbing, cyanosis or edema. Skin exam shows no rashes, ecchymoses or petechia. .  Lab Results  Component Value Date   WBC 6.3 12/23/2016   HGB 14.3 12/23/2016   HCT 42.4 12/23/2016   MCV 98 12/23/2016   PLT 263 12/23/2016     Chemistry      Component Value Date/Time   NA 143 12/23/2016 0848   K 4.3 12/23/2016 0848   CL 99 12/23/2016 0848   CO2 31 12/23/2016 0848   BUN 10 12/23/2016 0848   CREATININE 1.0 12/23/2016 0848      Component Value Date/Time   CALCIUM 9.4 12/23/2016 0848   ALKPHOS 99 (H) 12/23/2016 0848   AST 20 12/23/2016 0848   ALT 26 12/23/2016 0848   BILITOT 0.50 12/23/2016 0848         Impression and Plan: Carol Quinn is a 61 year old white female. She has a choroidal melanoma of the right eye. She underwent right eye enucleation. She had the enucleation back in October 2017.  For right now, I do not see any obvious recurrent disease.  Her alkaline phosphatase has been elevated a little bit.  I will see her  back in 4 months.  We will get another CT scan we see her back.  She does have significant COPD.  She does have a pulmonologist that she will call if she has more difficulty or starts to cough up purulent sputum.   [name], MD 12/14/201810:23 AM

## 2016-12-23 NOTE — Telephone Encounter (Addendum)
Patient is aware of results  ----- Message from [name], MD sent at 12/23/2016 12:46 PM EST ----- Call - CT scan is negative for any melanoma!!!  Laurey Arrow

## 2016-12-26 ENCOUNTER — Telehealth: Payer: Self-pay | Admitting: Internal Medicine

## 2016-12-26 MED ORDER — TRAMADOL HCL 50 MG PO TABS: ORAL_TABLET | ORAL | 0 refills | Status: DC

## 2016-12-26 MED ORDER — DOXYCYCLINE HYCLATE 100 MG PO TABS: 100.0000 mg | ORAL_TABLET | Freq: Two times a day (BID) | ORAL | 0 refills | Status: DC

## 2016-12-26 MED ORDER — PREDNISONE 10 MG PO TABS: ORAL_TABLET | ORAL | 0 refills | Status: DC

## 2016-12-26 NOTE — Telephone Encounter (Signed)
Pt is aware of below message and voiced her understanding.  Rx has been sent to preferred pharmacy for prednisone and doxy. Rx for Tramadol has been called in to preferred. Pt state she has taken this medication previously with no reaction, as a allergy indication popped up when ordering Tramadol.  Nothing further is needed.

## 2016-12-26 NOTE — Telephone Encounter (Signed)
Ok to Rx Tramadol 50 mg, # 40, 1-2 every 8 hours if needed for cough       Can also use otc Delsym cough syrup or Mucinex-DM            Rx prednisone 10 mg, # 20, 4 X 2 DAYS, 3 X 2 DAYS, 2 X 2 DAYS, 1 X 2 DAYS            Rx Doxycycline 100 mg, # 14, 1 twice daily     Ref x 1

## 2016-12-26 NOTE — Telephone Encounter (Signed)
Spoke with pt. States that she is not feeling well. Reports cough, wheezing, chest tightness and SOB. Cough is producing green mucus. Denies fever. Symptoms started 4 days ago. Pt is requesting Tramadol, Doxycycline and Prednisone.  CY - please advise. Thanks.  Allergies  Allergen Reactions  . Clarithromycin Diarrhea    Rash  Severe diarrhea   . Codeine Itching  . Hydrocodone-Homatropine     paranoia  . Hydrocodone-Homatropine Other (See Comments)    paranoia  . Penicillins Other (See Comments)    Unknown reaction, childhood allergy  . Topiramate Other (See Comments)    Other reaction(s): WEIGHT LOSS WEIGHT LOSS   Current Outpatient Medications on File Prior to Visit  Medication Sig Dispense Refill  . albuterol (PROVENTIL HFA;VENTOLIN HFA) 108 (90 Base) MCG/ACT inhaler Inhale 2 puffs into the lungs every 6 (six) hours as needed for wheezing or shortness of breath. 1 Inhaler 12  . atorvastatin (LIPITOR) 20 MG tablet TAKE 1 TABLET BY MOUTH ONCE DAILY 30 tablet 6  . budesonide-formoterol (SYMBICORT) 160-4.5 MCG/ACT inhaler Inhale 2 puffs then rinse mouth, twice daily 10.2 g 12  . buPROPion (WELLBUTRIN XL) 150 MG 24 hr tablet TAKE 1 TABLET BY MOUTH DAILY 30 tablet 6  . estradiol (VIVELLE-DOT) 0.1 MG/24HR patch Place onto the skin.    . Glycopyrrolate-Formoterol (BEVESPI AEROSPHERE) 9-4.8 MCG/ACT AERO Inhale 2 puffs into the lungs 2 (two) times daily. 1 Inhaler 12  . Glycopyrrolate-Formoterol (BEVESPI AEROSPHERE) 9-4.8 MCG/ACT AERO Inhale 2 puffs into the lungs 2 (two) times daily. 1 Inhaler 0  . losartan-hydrochlorothiazide (HYZAAR) 100-25 MG tablet TAKE 1 TABLET BY MOUTH DAILY 30 tablet 6  . modafinil (PROVIGIL) 200 MG tablet TAKE ONE TABLET BY MOUTH DAILY 30 tablet 5  . montelukast (SINGULAIR) 10 MG tablet Take 1 tablet (10 mg total) by mouth at bedtime. 30 tablet prn  . omeprazole (PRILOSEC) 40 MG capsule TAKE 1 CAPSULE BY MOUTH ONCE DAILY 30 capsule 6  . potassium chloride SA  (K-DUR,KLOR-CON) 20 MEQ tablet TAKE 1 TABLET BY MOUTH DAILY 30 tablet 6  . PREVIDENT 5000 PLUS 1.1 % CREA dental cream     . Respiratory Therapy Supplies (FLUTTER) DEVI Use as directed 1 each 0  . SUMAtriptan (IMITREX) 100 MG tablet Take one tablet at onset of migraine, repeat in 2 hours if needed. 10 tablet 6  . tiotropium (SPIRIVA HANDIHALER) 18 MCG inhalation capsule INHALE THE CONTENTS OF 1 CAPSULE ONCE DAILY AS DIRECTED. 30 capsule 12  . varenicline (CHANTIX PAK) 0.5 MG X 11 & 1 MG X 42 tablet Take one 0.5 mg tablet by mouth once daily for 3 days, then increase to one 0.5 mg tablet twice daily for 4 days, then increase to one 1 mg tablet twice daily. 53 tablet 1  . zolmitriptan (ZOMIG) 5 MG nasal solution Take as directed as needed for migraines      No current facility-administered medications on file prior to visit.

## 2016-12-28 ENCOUNTER — Telehealth: Payer: Self-pay | Admitting: Internal Medicine

## 2016-12-28 ENCOUNTER — Other Ambulatory Visit: Payer: Self-pay | Admitting: Family Medicine

## 2016-12-28 DIAGNOSIS — J449 Chronic obstructive pulmonary disease, unspecified: Secondary | ICD-10-CM

## 2016-12-28 NOTE — Telephone Encounter (Signed)
Called and spoke to pt. Pt is requesting neb tubing, pt states she is using albuterol neb prn per CY. Pt states she uses [facility] out of [place] (phone number per pt [phone]). Order placed for neb tubing. Pt verbalized understanding and denied any further questions or concerns at this time.

## 2016-12-31 ENCOUNTER — Telehealth: Payer: Self-pay | Admitting: Emergency Medicine

## 2016-12-31 ENCOUNTER — Encounter: Payer: Self-pay | Admitting: Emergency Medicine

## 2016-12-31 NOTE — Telephone Encounter (Signed)
Able to reach pharmacy at 9:50. They were closed earlier

## 2016-12-31 NOTE — Telephone Encounter (Signed)
Pt has had a URI, has been treated outpt with short course doxycycline and short pred taper, currently on pred 10mg . Over the last 24h she has been worse - more SOB, more wheeze and albuterol neb use. She questions whether she needs a longer course prednisone. Given progressive sx when pred tapered, this seems reasonable to me.   Will call in repeat pred taper >> Take 40mg  daily for 3 days, then 30mg  daily for 3 days, then 20mg  daily for 3 days, then 10mg  daily for 3 days, then stop  Extend her doxycycline at her request, although she has completed the 7 days prescribed by Dr Annamaria Boots.

## 2017-01-02 ENCOUNTER — Telehealth: Payer: Self-pay | Admitting: Internal Medicine

## 2017-01-02 NOTE — Telephone Encounter (Signed)
Pt completed pred taper and doxycyline on 12/31/16 with no improvement in symptoms.pt reports of pred cough with green mucus, chest congestion & chest tightness.  Denies fever, chills or sweats.  On call doctor refilled pred taper and doxy on 12/31/16. Pt states symptoms seem to be improving with second round.  Pt declined apt. Pt advised to complete second course of prednisone and doxycyline and if symptoms worsen over the holidays to go to an UC or ED.    Pt voiced her understanding and had no further questions.  Nothing further is needed.

## 2017-01-19 ENCOUNTER — Telehealth: Payer: Self-pay | Admitting: Internal Medicine

## 2017-01-20 NOTE — Telephone Encounter (Signed)
Rec'd completed forms from [place] to [facility] 01/20/17-pr

## 2017-01-23 ENCOUNTER — Encounter: Payer: Self-pay | Admitting: *Deleted

## 2017-01-31 ENCOUNTER — Encounter: Payer: Self-pay | Admitting: Internal Medicine

## 2017-01-31 ENCOUNTER — Ambulatory Visit: Payer: 59 | Admitting: Internal Medicine

## 2017-01-31 DIAGNOSIS — Z23 Encounter for immunization: Secondary | ICD-10-CM

## 2017-01-31 DIAGNOSIS — Z720 Tobacco use: Secondary | ICD-10-CM | POA: Diagnosis not present

## 2017-01-31 DIAGNOSIS — J441 Chronic obstructive pulmonary disease with (acute) exacerbation: Secondary | ICD-10-CM

## 2017-01-31 DIAGNOSIS — J9611 Chronic respiratory failure with hypoxia: Secondary | ICD-10-CM | POA: Diagnosis not present

## 2017-01-31 MED ORDER — FLUTICASONE-UMECLIDIN-VILANT 100-62.5-25 MCG/INH IN AEPB: 1.0000 | INHALATION_SPRAY | RESPIRATORY_TRACT | 0 refills | Status: AC

## 2017-01-31 MED ORDER — FLUTICASONE-UMECLIDIN-VILANT 100-62.5-25 MCG/INH IN AEPB
1.0000 | INHALATION_SPRAY | Freq: Every day | RESPIRATORY_TRACT | 12 refills | Status: DC
Start: 2017-01-31 — End: 2017-02-01

## 2017-01-31 NOTE — Assessment & Plan Note (Signed)
Lingering exacerbation.  Her ongoing smoking is obviously key as discussed.  We question if she might have a low-grade sinus infection complicating the reported area of infection in her eye socket.  Clindamycin should cover sinus infection and bronchitis related to any bacterial infection.  Hopefully she will tolerated okay. Plan-she is given samples of Trelegy to try instead of Bevespi, questioning if an inhaled steroid would help her.  After consideration and discussion-Prevnar 13 today.

## 2017-01-31 NOTE — Progress Notes (Signed)
Patient ID: Carol Quinn, female    DOB: 1955-07-28, 62 y.o.   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 Pt is having alot of wheezing as well.  Doxy, tramadol and pred given 9/24. O2 2l sleep and prn/ APS She continues close follow-up by oncology with imaging surveillance.  No metastatic disease identified. Since bronchitis exacerbation in late September, she has had persistent cough productive of white sputum with no fever.  Nebulizer and tramadol have been very helpful.  She continues on oxygen 2 L for sleep and carries her portable most of the time in her car. She would like flu shot at this visit.  Her last pneumonia vaccine was 4 years ago when I suggested she repeat Pneumovax next year, waiting on Prevnar until she is 38 or otherwise directed.  Her lung disease is bad enough that earlier Prevnar may be indicated. CT chest 08/22/16 IMPRESSION: 1. No evidence of metastatic disease. 2.  Aortic atherosclerosis (ICD10-170.0). 3.  Emphysema (ICD10-J43.9).  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 ----Pt is wheezing, SOB w/exertion. Pt is not currently using O2 at 2 liters with exertion todays OV. Pt has productive cough- yellow with some green. Pt states been feeling sick for 6 weeks. Flutter device, Singulair, Bevespi, nebulizer albuterol Lingering nasal congestion and mild productive cough since bronchitis in December.  No fever or chills.  Still smoking some-discussed.  Wheezes with exertion.  Sleeps with oxygen and uses it as needed in the daytime.  Pending colonoscopy tomorrow.  Pending office surgery to repair a defect in enucleated socket which has gotten infected.  Her ophthalmologist is starting her on clindamycin 100 mg 3 times daily. We discussed alternatives to current respiratory meds. CT chest 12/23/16 Lungs/Pleura: Centrilobular emphysema. Stable biapical pleuroparenchymal scarring. Airway thickening is present, suggesting bronchitis or reactive airways disease. No findings of metastatic disease to the lungs.  ROS-see HPI   + = positive Constitutional:   No-   weight loss, night sweats, fevers, chills, +fatigue, lassitude. HEENT:   No-  headaches, difficulty swallowing, tooth/dental problems, sore throat,       No-  sneezing, itching, ear ache, +nasal congestion, +post nasal drip,  CV:  No-   chest pain, orthopnea, PND, swelling in lower extremities, anasarca, dizziness, palpitations Resp: + shortness of breath with exertion or at rest.               + productive cough,  + non-productive cough,  No- coughing up of blood.               change in color of mucus.  + wheezing.   Skin:  No-   rash or lesions. GI:  No-   heartburn, indigestion, abdominal pain, nausea, vomiting,  GU:  MS:  No-   joint pain or swelling.   Neuro-      nothing unusual Psych:  No- change in mood or affect. No depression or anxiety.  No memory loss.  OBJ- Physical Exam   General- Alert, Oriented, Affect-appropriate,   Skin- clear  Lymphadenopathy- none Head- atraumatic            Eyes- + R prosthesis-wiping corner of eye frequently            Ears- Hearing, canals-normal            Nose- clear, no-Septal dev, mucus, polyps, erosion, perforation             Throat- Mallampati IV , mucosa clear/ not red , drainage- none, tonsils- small residual Neck- flexible , trachea midline, no stridor , thyroid nl, carotid no bruit Chest - symmetrical excursion , unlabored           Heart/CV- RRR , no murmur , no gallop  , no rub, nl s1 s2                           - JVD- none , edema- none, stasis changes- none, varices- none           Lung-  wheeze and, cough-none , dullness-none, rub- none,                                + distant breath sounds           Chest wall-  Abd-  Br/ Gen/ Rectal- Not done, not indicated Extrem- cyanosis- none, clubbing, none, atrophy- none, strength- nl Neuro- grossly intact to observation

## 2017-01-31 NOTE — Patient Instructions (Addendum)
Sample x 2 Trelegy    Inhale 1 puff then rinse mouth, once daily       Try this instead of Bevespi.  If you like it better, then you can get the script filled. Your choice if you prefer the [facility].  I will fill out the insurance paper.   Order- Prevnar 13 pneumonia vaccine  Please call as needed

## 2017-01-31 NOTE — Assessment & Plan Note (Addendum)
I understand she has had considerable stress in the last 2 years, and I am proud of how well she has coped. but smoking will not make her life better.  Strongly encouraged to stop.

## 2017-01-31 NOTE — Assessment & Plan Note (Signed)
She is using oxygen appropriately.  Labile saturation affected by environment and exertion.

## 2017-02-01 ENCOUNTER — Ambulatory Visit: Payer: 59 | Admitting: Internal Medicine

## 2017-02-01 ENCOUNTER — Encounter: Payer: Self-pay | Admitting: Internal Medicine

## 2017-02-01 VITALS — BP 128/78 | HR 66 | Ht 62.0 in | Wt 135.0 lb

## 2017-02-01 DIAGNOSIS — K219 Gastro-esophageal reflux disease without esophagitis: Secondary | ICD-10-CM | POA: Diagnosis not present

## 2017-02-01 DIAGNOSIS — Z1211 Encounter for screening for malignant neoplasm of colon: Secondary | ICD-10-CM

## 2017-02-01 MED ORDER — SUPREP BOWEL PREP KIT 17.5-3.13-1.6 GM/177ML PO SOLN: 1.0000 | ORAL | 0 refills | Status: DC

## 2017-02-01 NOTE — Patient Instructions (Signed)
You have been scheduled for a colonoscopy. Please follow written instructions given to you at your visit today.  Please pick up your prep supplies at the pharmacy within the next 1-3 days. If you use inhalers (even only as needed), please bring them with you on the day of your procedure. Your physician has requested that you go to www.startemmi.com and enter the access code given to you at your visit today. This web site gives a general overview about your procedure. However, you should still follow specific instructions given to you by our office regarding your preparation for the procedure.  If you are age 60 or older, your body mass index should be between 23-30. Your Body mass index is 24.69 kg/m. If this is out of the aforementioned range listed, please consider follow up with your Primary Care Provider.  If you are age 61 or younger, your body mass index should be between 19-25. Your Body mass index is 24.69 kg/m. If this is out of the aformentioned range listed, please consider follow up with your Primary Care Provider.

## 2017-02-01 NOTE — Progress Notes (Signed)
Patient ID: Carol Quinn, female   DOB: 11/16/1955, 62 y.o.   MRN: [id number] HPI: Nelda Luckey is a 62 year old female with a history of COPD on nocturnal oxygen, ocular melanoma, hypertension, GERD, who was seen in consultation at the request of Dr. Birdie Riddle to discuss colon cancer screening.  She is here alone today.  She reports that she is feeling well from a GI perspective.  She had a colonoscopy around 2004 in [facility] with Dr. Shana Chute which she reports was normal.  She denies change in her bowel habits including diarrhea, constipation, blood in her stool or melena.  She does occasionally have change in her stool color based on her diet but she denies black and tarry stool.  No abdominal pain.  She has a history of reflux which she states is well controlled with omeprazole 40 mg daily.  She denies heartburn on this medication.  She denies dysphagia and odynophagia.  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  These have been negative to this point.  She has a right eye prosthesis.  She wears a nocturnal oxygen with her history of COPD.  She continues to smoke cigarettes but is going to try Chantix again.  Chantix helped curb her smoking but when the medication is withdrawn she goes back to smoking.  Her nephew on her mother side was just diagnosed with colon cancer at age 61.  Her father had colon polyps.  No other known GI tract malignancy.  Past Medical History:  Diagnosis Date  . Acute bronchitis   . Allergic rhinitis   . Aortic atherosclerosis ([place])   . Cancer ([place])   . Childhood asthma   . Chronic headaches   . Complication of anesthesia    woke up during surgery x 1  . Complication of anesthesia    hard to wake up x 1  . COPD (chronic obstructive pulmonary disease) ([facility])    emphysema  . Emphysema, unspecified ([place])   . Exposure to TB    uncle-her PPD neg  . GERD (gastroesophageal reflux disease)   .  Hepatic cyst    left  . Hypertension   . MDS (myelodysplastic syndrome), low grade ([place]) 09/23/2016  . Ocular melanoma, right ([place])   . RBBB (right bundle branch block with left anterior fascicular block)     Past Surgical History:  Procedure Laterality Date  . BREAST BIOPSY  2002  . NOSE SURGERY     "nasal deviation"  . TOTAL ABDOMINAL HYSTERECTOMY    . TUBAL LIGATION      Outpatient Medications Prior to Visit  Medication Sig Dispense Refill  . albuterol (PROVENTIL HFA;VENTOLIN HFA) 108 (90 Base) MCG/ACT inhaler Inhale 2 puffs into the lungs every 6 (six) hours as needed for wheezing or shortness of breath. 1 Inhaler 12  . albuterol (PROVENTIL) (2.5 MG/3ML) 0.083% nebulizer solution Take 2.5 mg by nebulization every 6 (six) hours as needed for wheezing or shortness of breath.    Marland Kitchen atorvastatin (LIPITOR) 20 MG tablet TAKE 1 TABLET BY MOUTH ONCE DAILY 30 tablet 6  . buPROPion (WELLBUTRIN XL) 150 MG 24 hr tablet TAKE 1 TABLET BY MOUTH DAILY 30 tablet 6  . clindamycin (CLEOCIN) 150 MG capsule Take 150 mg by mouth 3 (three) times daily.    Marland Kitchen escitalopram (LEXAPRO) 20 MG tablet Take 1 tablet (20 mg total) by mouth daily. Please call [phone] to schedule a Blood Pressure follow up.  30 tablet 6  . estradiol (VIVELLE-DOT) 0.1 MG/24HR patch Place onto the skin.    Marland Kitchen Fluticasone-Umeclidin-Vilant (TRELEGY ELLIPTA) 100-62.5-25 MCG/INH AEPB Inhale 1 Inhaler into the lungs 1 day or 1 dose for 1 dose. 1 each 0  . Glycopyrrolate-Formoterol (BEVESPI AEROSPHERE) 9-4.8 MCG/ACT AERO Inhale 2 puffs into the lungs 2 (two) times daily. 1 Inhaler 12  . Glycopyrrolate-Formoterol (BEVESPI AEROSPHERE) 9-4.8 MCG/ACT AERO Inhale 2 puffs into the lungs 2 (two) times daily. 1 Inhaler 0  . losartan-hydrochlorothiazide (HYZAAR) 100-25 MG tablet TAKE 1 TABLET BY MOUTH DAILY 30 tablet 6  . modafinil (PROVIGIL) 200 MG tablet TAKE ONE TABLET BY MOUTH DAILY 30 tablet 5  . montelukast (SINGULAIR) 10 MG tablet Take 1 tablet  (10 mg total) by mouth at bedtime. 30 tablet prn  . neomycin-polymyxin b-dexamethasone (MAXITROL) 3.5-10000-0.1 OINT Place 1 application into both eyes 3 (three) times daily.    Marland Kitchen omeprazole (PRILOSEC) 40 MG capsule TAKE 1 CAPSULE BY MOUTH ONCE DAILY 30 capsule 6  . potassium chloride SA (K-DUR,KLOR-CON) 20 MEQ tablet TAKE 1 TABLET BY MOUTH DAILY 30 tablet 6  . PREVIDENT 5000 PLUS 1.1 % CREA dental cream     . Respiratory Therapy Supplies (FLUTTER) DEVI Use as directed 1 each 0  . SUMAtriptan (IMITREX) 100 MG tablet Take one tablet at onset of migraine, repeat in 2 hours if needed. 10 tablet 6  . zolmitriptan (ZOMIG) 5 MG nasal solution Take as directed as needed for migraines     . Fluticasone-Umeclidin-Vilant (TRELEGY ELLIPTA) 100-62.5-25 MCG/INH AEPB Inhale 1 puff into the lungs daily. 60 each 12   No facility-administered medications prior to visit.     Allergies  Allergen Reactions  . Clarithromycin Diarrhea    Rash  Severe diarrhea   . Codeine Itching  . Hydrocodone-Homatropine     paranoia  . Hydrocodone-Homatropine Other (See Comments)    paranoia  . Penicillins Other (See Comments)    Unknown reaction, childhood allergy  . Topiramate Other (See Comments)    Other reaction(s): WEIGHT LOSS WEIGHT LOSS    Family History  Problem Relation Age of Onset  . Heart failure Father   . Emphysema Mother   . Heart attack Mother   . Asthma Sister   . Asthma Maternal Uncle   . Arthritis Maternal Grandmother   . Cancer Maternal Grandmother   . Cancer Sister   . Cancer Unknown        aunts  . Cancer Unknown        uncles  . Colon cancer Other     Social History   Tobacco Use  . Smoking status: Current Every Day Smoker    Packs/day: 0.50    Years: 40.00    Pack years: 20.00    Types: Cigarettes  . Smokeless tobacco: Never Used  . Tobacco comment: Bad day 6-7 cigs a day  Substance Use Topics  . Alcohol use: No    Alcohol/week: 0.0 oz  . Drug use: No    ROS: As  per history of present illness, otherwise negative  BP 128/78   Pulse 66   Ht 5\' 2"  (1.575 m)   Wt 135 lb (61.2 kg)   BMI 24.69 kg/m  Constitutional: Well-developed and well-nourished. No distress. HEENT: Normocephalic and atraumatic. Oropharynx is clear and moist.  Neck: Neck supple. Trachea midline. Cardiovascular: Normal rate, regular rhythm and intact distal pulses. No M/R/G Pulmonary/chest: Effort normal and distant breath sounds  No wheezing, rales or rhonchi. Abdominal: Soft,  nontender, nondistended. Bowel sounds active throughout.  Extremities: no clubbing, cyanosis, or edema Neurological: Alert and oriented to person place and time. Skin: Skin is warm and dry.  Psychiatric: Normal mood and affect. Behavior is normal.  RELEVANT LABS AND IMAGING: CBC    Component Value Date/Time   WBC 6.3 12/23/2016 0848   WBC 6.4 06/28/2016 0936   RBC 4.35 12/23/2016 0848   RBC 4.26 06/28/2016 0936   HGB 14.3 12/23/2016 0848   HCT 42.4 12/23/2016 0848   PLT 263 12/23/2016 0848   MCV 98 12/23/2016 0848   MCH 32.9 12/23/2016 0848   MCH 32.6 10/15/2015 1512   MCHC 33.7 12/23/2016 0848   MCHC 33.4 06/28/2016 0936   RDW 12.5 12/23/2016 0848   LYMPHSABS 1.8 12/23/2016 0848   MONOABS 0.5 06/28/2016 0936   EOSABS 0.3 12/23/2016 0848   BASOSABS 0.1 12/23/2016 0848    CMP     Component Value Date/Time   NA 143 12/23/2016 0848   K 4.3 12/23/2016 0848   CL 99 12/23/2016 0848   CO2 31 12/23/2016 0848   GLUCOSE 89 12/23/2016 0848   BUN 10 12/23/2016 0848   CREATININE 1.0 12/23/2016 0848   CALCIUM 9.4 12/23/2016 0848   PROT 6.3 (L) 12/23/2016 0848   ALBUMIN 3.6 12/23/2016 0848   AST 20 12/23/2016 0848   ALT 26 12/23/2016 0848   ALKPHOS 99 (H) 12/23/2016 0848   BILITOT 0.50 12/23/2016 0848   GFRNONAA >90 04/09/2011 0342   GFRAA >90 04/09/2011 0342    ASSESSMENT/PLAN:  62 year old female with a history of COPD on nocturnal oxygen, ocular melanoma, hypertension, GERD, who was  seen in consultation at the request of Dr. Birdie Riddle to discuss colon cancer screening.   1.  Colon cancer screening --I have recommended screening colonoscopy at this time.  We discussed the risk, benefits and alternatives and she is agreeable and wishes to proceed.  This will need to be performed in the outpatient hospital setting with monitored anesthesia care given her oxygen use in the setting of her COPD.  2.  GERD --well-controlled on omeprazole without alarm symptoms.  She will continue her current dose of this medication.    TX:MIWOEH, Aundra Millet, Md [address] [place], [place] [address]

## 2017-03-14 ENCOUNTER — Encounter (HOSPITAL_COMMUNITY): Payer: Self-pay | Admitting: Emergency Medicine

## 2017-03-14 ENCOUNTER — Other Ambulatory Visit: Payer: Self-pay

## 2017-03-21 ENCOUNTER — Ambulatory Visit (HOSPITAL_COMMUNITY): Payer: 59 | Admitting: Registered Nurse

## 2017-03-21 ENCOUNTER — Encounter (HOSPITAL_COMMUNITY): Payer: Self-pay

## 2017-03-21 ENCOUNTER — Ambulatory Visit (HOSPITAL_COMMUNITY)
Admission: RE | Admit: 2017-03-21 | Discharge: 2017-03-21 | Disposition: A | Discharge status: Home / Self Care | Payer: 59 | Source: Ambulatory Visit | Attending: Internal Medicine | Admitting: Internal Medicine

## 2017-03-21 ENCOUNTER — Other Ambulatory Visit: Payer: Self-pay

## 2017-03-21 ENCOUNTER — Encounter (HOSPITAL_COMMUNITY)
Admission: RE | Disposition: A | Discharge status: Home / Self Care | Payer: Self-pay | Source: Ambulatory Visit | Attending: Internal Medicine

## 2017-03-21 DIAGNOSIS — Z888 Allergy status to other drugs, medicaments and biological substances status: Secondary | ICD-10-CM | POA: Diagnosis not present

## 2017-03-21 DIAGNOSIS — Z1211 Encounter for screening for malignant neoplasm of colon: Secondary | ICD-10-CM | POA: Diagnosis not present

## 2017-03-21 DIAGNOSIS — D124 Benign neoplasm of descending colon: Secondary | ICD-10-CM | POA: Diagnosis not present

## 2017-03-21 DIAGNOSIS — J439 Emphysema, unspecified: Secondary | ICD-10-CM | POA: Diagnosis not present

## 2017-03-21 DIAGNOSIS — K219 Gastro-esophageal reflux disease without esophagitis: Secondary | ICD-10-CM | POA: Diagnosis not present

## 2017-03-21 DIAGNOSIS — K7689 Other specified diseases of liver: Secondary | ICD-10-CM | POA: Insufficient documentation

## 2017-03-21 DIAGNOSIS — I700 Atherosclerosis of aorta: Secondary | ICD-10-CM | POA: Insufficient documentation

## 2017-03-21 DIAGNOSIS — D125 Benign neoplasm of sigmoid colon: Secondary | ICD-10-CM | POA: Diagnosis not present

## 2017-03-21 DIAGNOSIS — K5730 Diverticulosis of large intestine without perforation or abscess without bleeding: Secondary | ICD-10-CM | POA: Diagnosis not present

## 2017-03-21 DIAGNOSIS — I10 Essential (primary) hypertension: Secondary | ICD-10-CM | POA: Diagnosis not present

## 2017-03-21 DIAGNOSIS — Z880 Allergy status to penicillin: Secondary | ICD-10-CM | POA: Insufficient documentation

## 2017-03-21 DIAGNOSIS — Z800 Family history of malignant neoplasm of digestive organs: Secondary | ICD-10-CM | POA: Insufficient documentation

## 2017-03-21 DIAGNOSIS — Z885 Allergy status to narcotic agent status: Secondary | ICD-10-CM | POA: Insufficient documentation

## 2017-03-21 DIAGNOSIS — I739 Peripheral vascular disease, unspecified: Secondary | ICD-10-CM | POA: Insufficient documentation

## 2017-03-21 DIAGNOSIS — Z85840 Personal history of malignant neoplasm of eye: Secondary | ICD-10-CM | POA: Diagnosis not present

## 2017-03-21 DIAGNOSIS — F17210 Nicotine dependence, cigarettes, uncomplicated: Secondary | ICD-10-CM | POA: Insufficient documentation

## 2017-03-21 DIAGNOSIS — I452 Bifascicular block: Secondary | ICD-10-CM | POA: Insufficient documentation

## 2017-03-21 HISTORY — PX: COLONOSCOPY WITH PROPOFOL: SHX5780

## 2017-03-21 SURGERY — COLONOSCOPY WITH PROPOFOL
Anesthesia: Monitor Anesthesia Care

## 2017-03-21 MED ORDER — SODIUM CHLORIDE 0.9 % IV SOLN: INTRAVENOUS | Status: DC

## 2017-03-21 MED ORDER — PROPOFOL 10 MG/ML IV BOLUS
INTRAVENOUS | Status: DC | PRN
  Administered 2017-03-21: 10 mg via INTRAVENOUS
  Administered 2017-03-21: 20 mg via INTRAVENOUS
  Administered 2017-03-21: 10 mg via INTRAVENOUS
  Administered 2017-03-21: 20 mg via INTRAVENOUS

## 2017-03-21 MED ORDER — LIDOCAINE 2% (20 MG/ML) 5 ML SYRINGE
INTRAMUSCULAR | Status: DC | PRN
  Administered 2017-03-21: 100 mg via INTRAVENOUS

## 2017-03-21 MED ORDER — PROPOFOL 500 MG/50ML IV EMUL
INTRAVENOUS | Status: DC | PRN
  Administered 2017-03-21: 130 ug/kg/min via INTRAVENOUS

## 2017-03-21 MED ORDER — PROPOFOL 10 MG/ML IV BOLUS
INTRAVENOUS | Status: AC
  Filled 2017-03-21: qty 40

## 2017-03-21 MED ORDER — LACTATED RINGERS IV SOLN
INTRAVENOUS | Status: DC
  Administered 2017-03-21: 1000 mL via INTRAVENOUS

## 2017-03-21 SURGICAL SUPPLY — 21 items

## 2017-03-21 NOTE — Anesthesia Postprocedure Evaluation (Signed)
Anesthesia Post Note  Patient: Carol Quinn  Procedure(s) Performed: COLONOSCOPY WITH PROPOFOL (N/A )     Patient location during evaluation: PACU Anesthesia Type: MAC Level of consciousness: awake and alert Pain management: pain level controlled Vital Signs Assessment: post-procedure vital signs reviewed and stable Respiratory status: spontaneous breathing, nonlabored ventilation, respiratory function stable and patient connected to nasal cannula oxygen Cardiovascular status: stable and blood pressure returned to baseline Postop Assessment: no apparent nausea or vomiting Anesthetic complications: no    Last Vitals:  Vitals:   03/21/17 0921 03/21/17 0930  BP: (!) 118/52 (!) 118/53  Pulse: 87 77  Resp: 20 20  Temp:    SpO2: 100% 99%    Last Pain:  Vitals:   03/21/17 0725  TempSrc: Oral                 Clark Cuff S

## 2017-03-21 NOTE — Anesthesia Preprocedure Evaluation (Signed)
Anesthesia Evaluation  Patient identified by MRN, date of birth, ID band Patient awake    Reviewed: Allergy & Precautions, NPO status , Patient's Chart, lab work & pertinent test results  Airway Mallampati: II  TM Distance: >3 FB Neck ROM: Full    Dental no notable dental hx.    Pulmonary COPD, Current Smoker,    Pulmonary exam normal breath sounds clear to auscultation       Cardiovascular hypertension, + Peripheral Vascular Disease  Normal cardiovascular exam Rhythm:Regular Rate:Normal     Neuro/Psych negative neurological ROS  negative psych ROS   GI/Hepatic Neg liver ROS, GERD  ,  Endo/Other  negative endocrine ROS  Renal/GU negative Renal ROS  negative genitourinary   Musculoskeletal negative musculoskeletal ROS (+)   Abdominal   Peds negative pediatric ROS (+)  Hematology negative hematology ROS (+)   Anesthesia Other Findings   Reproductive/Obstetrics negative OB ROS                             Anesthesia Physical Anesthesia Plan  ASA: III  Anesthesia Plan: MAC   Post-op Pain Management:    Induction: Intravenous  PONV Risk Score and Plan: 0 and Treatment may vary due to age or medical condition  Airway Management Planned: Simple Face Mask  Additional Equipment:   Intra-op Plan:   Post-operative Plan:   Informed Consent: I have reviewed the patients History and Physical, chart, labs and discussed the procedure including the risks, benefits and alternatives for the proposed anesthesia with the patient or authorized representative who has indicated his/her understanding and acceptance.   Dental advisory given  Plan Discussed with: CRNA and Surgeon  Anesthesia Plan Comments:         Anesthesia Quick Evaluation

## 2017-03-21 NOTE — Transfer of Care (Signed)
Immediate Anesthesia Transfer of Care Note  Patient: Carol Quinn  Procedure(s) Performed: COLONOSCOPY WITH PROPOFOL (N/A )  Patient Location: PACU and Endoscopy Unit  Anesthesia Type:MAC  Level of Consciousness: awake, alert , oriented and patient cooperative  Airway & Oxygen Therapy: Patient Spontanous Breathing and Patient connected to face mask oxygen  Post-op Assessment: Report given to RN, Post -op Vital signs reviewed and stable and Patient moving all extremities  Post vital signs: Reviewed and stable  Last Vitals:  Vitals:   03/21/17 0725  BP: (!) 144/55  Pulse: 84  Resp: 16  Temp: 36.4 C  SpO2: 97%    Last Pain:  Vitals:   03/21/17 0725  TempSrc: Oral         Complications: No apparent anesthesia complications

## 2017-03-21 NOTE — H&P (Signed)
HPI: 62 yo female with hx of COPD, ocular melanoma, HTN, GERD here for screening colon.  Tolerated prep.  No new complaints since being seen in office on 02/01/17  Past Medical History:  Diagnosis Date  . Acute bronchitis   . Allergic rhinitis   . Aortic atherosclerosis ([place])   . Cancer ([place])   . Childhood asthma   . Chronic headaches   . Complication of anesthesia    woke up during surgery x 1  . Complication of anesthesia    hard to wake up x 1  . COPD (chronic obstructive pulmonary disease) ([facility])    emphysema  . Emphysema, unspecified ([place])   . Exposure to TB    uncle-her PPD neg  . GERD (gastroesophageal reflux disease)   . Hepatic cyst    left  . Hypertension   . MDS (myelodysplastic syndrome), low grade ([place]) 09/23/2016  . Ocular melanoma, right ([place])   . RBBB (right bundle branch block with left anterior fascicular block)     Past Surgical History:  Procedure Laterality Date  . BREAST BIOPSY  2002  . NOSE SURGERY     "nasal deviation"  . TOTAL ABDOMINAL HYSTERECTOMY    . TUBAL LIGATION       (Not in an outpatient encounter)  Allergies  Allergen Reactions  . Clarithromycin Diarrhea, Rash and Other (See Comments)    Severe diarrhea   . Codeine Itching  . Hydrocodone-Homatropine Other (See Comments)    paranoia  . Hydrocodone-Homatropine Other (See Comments)    paranoia  . Penicillins Other (See Comments)    Unknown reaction, childhood allergy Has patient had a PCN reaction causing immediate rash, facial/tongue/throat swelling, SOB or lightheadedness with hypotension: Unknown Has patient had a PCN reaction causing severe rash involving mucus membranes or skin necrosis: Unknown Has patient had a PCN reaction that required hospitalization: Unknown Has patient had a PCN reaction occurring within the last 10 years: No If all of the above answers are "NO", then may proceed with Cephalosporin use.   . Topiramate Other (See Comments)    WEIGHT LOSS    Family  History  Problem Relation Age of Onset  . Heart failure Father   . Emphysema Mother   . Heart attack Mother   . Asthma Sister   . Asthma Maternal Uncle   . Arthritis Maternal Grandmother   . Cancer Maternal Grandmother   . Cancer Sister   . Cancer Unknown        aunts  . Cancer Unknown        uncles  . Colon cancer Other     Social History   Tobacco Use  . Smoking status: Current Every Day Smoker    Packs/day: 0.50    Years: 40.00    Pack years: 20.00    Types: Cigarettes  . Smokeless tobacco: Never Used  . Tobacco comment: Bad day 6-7 cigs a day  Substance Use Topics  . Alcohol use: No    Alcohol/week: 0.0 oz  . Drug use: No    ROS: As per history of present illness, otherwise negative  BP (!) 144/55   Pulse 84   Temp 97.6 F (36.4 C) (Oral)   Resp 16   Ht 5' 2"  (1.575 m)   Wt 135 lb (61.2 kg)   SpO2 97%   BMI 24.69 kg/m  Gen: awake, alert, NAD HEENT: anicteric, op clear, right eye socket covered CV: RRR, no mrg Pulm: CTA b/l Abd: soft, NT/ND, +BS  throughout Ext: no c/c/e Neuro: nonfocal   RELEVANT LABS AND IMAGING: CBC    Component Value Date/Time   WBC 6.3 12/23/2016 0848   WBC 6.4 06/28/2016 0936   RBC 4.35 12/23/2016 0848   RBC 4.26 06/28/2016 0936   HGB 14.3 12/23/2016 0848   HCT 42.4 12/23/2016 0848   PLT 263 12/23/2016 0848   MCV 98 12/23/2016 0848   MCH 32.9 12/23/2016 0848   MCH 32.6 10/15/2015 1512   MCHC 33.7 12/23/2016 0848   MCHC 33.4 06/28/2016 0936   RDW 12.5 12/23/2016 0848   LYMPHSABS 1.8 12/23/2016 0848   MONOABS 0.5 06/28/2016 0936   EOSABS 0.3 12/23/2016 0848   BASOSABS 0.1 12/23/2016 0848    CMP     Component Value Date/Time   NA 143 12/23/2016 0848   K 4.3 12/23/2016 0848   CL 99 12/23/2016 0848   CO2 31 12/23/2016 0848   GLUCOSE 89 12/23/2016 0848   BUN 10 12/23/2016 0848   CREATININE 1.0 12/23/2016 0848   CALCIUM 9.4 12/23/2016 0848   PROT 6.3 (L) 12/23/2016 0848   ALBUMIN 3.6 12/23/2016 0848   AST 20  12/23/2016 0848   ALT 26 12/23/2016 0848   ALKPHOS 99 (H) 12/23/2016 0848   BILITOT 0.50 12/23/2016 0848   GFRNONAA >90 04/09/2011 0342   GFRAA >90 04/09/2011 0342    ASSESSMENT/PLAN: 62 yo female with hx of COPD, ocular melanoma, HTN, GERD here for screening colon.    1. CRC screening -- colon today with MAC. The nature of the procedure, as well as the risks, benefits, and alternatives were carefully and thoroughly reviewed with the patient. Ample time for discussion and questions allowed. The patient understood, was satisfied, and agreed to proceed.

## 2017-03-21 NOTE — Discharge Instructions (Signed)

## 2017-03-21 NOTE — Op Note (Signed)
[facility] Patient Name: Carol Quinn Procedure Date: 03/21/2017 MRN: [id number] Attending MD: Jerene Bears , MD Date of Birth: 08/18/1955 CSN: [id number] Age: 62 Admit Type: Outpatient Procedure:                Colonoscopy Indications:              Screening for colorectal malignant neoplasm, Last                            colonoscopy 10 years ago Providers:                Lajuan Lines. Hilarie Fredrickson, MD, Kingsley Plan, RN, Charolette Child, Technician, Elspeth Cho Tech.,                            Technician, Courtney Heys [place], CRNA Referring MD:             Aundra Millet. Birdie Riddle, MD Medicines:                Monitored Anesthesia Care Complications:            No immediate complications. Estimated Blood Loss:     Estimated blood loss was minimal. Procedure:                Pre-Anesthesia Assessment:                           - Prior to the procedure, a History and Physical                            was performed, and patient medications and                            allergies were reviewed. The patient's tolerance of                            previous anesthesia was also reviewed. The risks                            and benefits of the procedure and the sedation                            options and risks were discussed with the patient.                            All questions were answered, and informed consent                            was obtained. Prior Anticoagulants: The patient has                            taken no previous anticoagulant or antiplatelet  agents. ASA Grade Assessment: III - A patient with                            severe systemic disease. After reviewing the risks                            and benefits, the patient was deemed in                            satisfactory condition to undergo the procedure.                           After obtaining informed consent, the colonoscope                was passed under direct vision. Throughout the                            procedure, the patient's blood pressure, pulse, and                            oxygen saturations were monitored continuously. The                            Colonoscope was introduced through the anus and                            advanced to the the cecum, identified by                            appendiceal orifice and ileocecal valve. The                            colonoscopy was performed without difficulty. The                            patient tolerated the procedure well. The quality                            of the bowel preparation was good. The ileocecal                            valve, appendiceal orifice, and rectum were                            photographed. Scope In: 8:54:00 AM Scope Out: 9:13:30 AM Scope Withdrawal Time: 0 hours 14 minutes 4 seconds  Total Procedure Duration: 0 hours 19 minutes 30 seconds  Findings:      The digital rectal exam was normal.      A 5 mm polyp was found in the descending colon. The polyp was sessile.       The polyp was removed with a cold snare. Resection and retrieval were       complete.      A 7 mm polyp was found in the sigmoid colon. The polyp was sessile. The  polyp was removed with a cold snare. Resection and retrieval were       complete.      Scattered small-mouthed diverticula were found in the sigmoid colon.      The exam was otherwise without abnormality on direct and retroflexion       views. Impression:               - One 5 mm polyp in the descending colon, removed                            with a cold snare. Resected and retrieved.                           - One 7 mm polyp in the sigmoid colon, removed with                            a cold snare. Resected and retrieved.                           - Diverticulosis in the sigmoid colon.                           - The examination was otherwise normal on direct                             and retroflexion views. Moderate Sedation:      N/A Recommendation:           - Patient has a contact number available for                            emergencies. The signs and symptoms of potential                            delayed complications were discussed with the                            patient. Return to normal activities tomorrow.                            Written discharge instructions were provided to the                            patient.                           - Resume previous diet.                           - Continue present medications.                           - Await pathology results.                           - Repeat colonoscopy is recommended. The  colonoscopy date will be determined after pathology                            results from today's exam become available for                            review. Procedure Code(s):        --- Professional ---                           [phone], Colonoscopy, flexible; with removal of                            tumor(s), polyp(s), or other lesion(s) by snare                            technique Diagnosis Code(s):        --- Professional ---                           Z12.11, Encounter for screening for malignant                            neoplasm of colon                           D12.4, Benign neoplasm of descending colon                           D12.5, Benign neoplasm of sigmoid colon CPT copyright 2016 [employer]. All rights reserved. The codes documented in this report are preliminary and upon coder review may  be revised to meet current compliance requirements. Jerene Bears, MD 03/21/2017 9:25:49 AM This report has been signed electronically. Number of Addenda: 0

## 2017-03-23 ENCOUNTER — Encounter: Payer: Self-pay | Admitting: Internal Medicine

## 2017-03-23 ENCOUNTER — Encounter (HOSPITAL_COMMUNITY): Payer: Self-pay | Admitting: Internal Medicine

## 2017-03-27 ENCOUNTER — Encounter: Payer: Self-pay | Admitting: Family Medicine

## 2017-03-27 ENCOUNTER — Ambulatory Visit: Payer: 59 | Admitting: Family Medicine

## 2017-03-27 ENCOUNTER — Encounter: Payer: Self-pay | Admitting: General Practice

## 2017-03-27 ENCOUNTER — Other Ambulatory Visit: Payer: Self-pay

## 2017-03-27 VITALS — BP 120/81 | HR 72 | Temp 98.1°F | Resp 16 | Ht 62.0 in | Wt 136.4 lb

## 2017-03-27 DIAGNOSIS — C6931 Malignant neoplasm of right choroid: Secondary | ICD-10-CM | POA: Diagnosis not present

## 2017-03-27 DIAGNOSIS — Z01818 Encounter for other preprocedural examination: Secondary | ICD-10-CM

## 2017-03-27 DIAGNOSIS — E785 Hyperlipidemia, unspecified: Secondary | ICD-10-CM

## 2017-03-27 LAB — LIPID PANEL
CHOL/HDL RATIO: 3
Cholesterol: 176 mg/dL (ref 0–200)
HDL: 61.2 mg/dL (ref >39.00)
LDL CALC: 89 mg/dL (ref 0–99)
NONHDL: 114.44
TRIGLYCERIDES: 128 mg/dL (ref 0.0–149.0)
VLDL: 25.6 mg/dL (ref 0.0–40.0)

## 2017-03-27 LAB — CBC WITH DIFFERENTIAL/PLATELET
BASOS PCT: 1.8 % (ref 0.0–3.0)
Basophils Absolute: 0.1 10*3/uL (ref 0.0–0.1)
EOS ABS: 0.2 10*3/uL (ref 0.0–0.7)
EOS PCT: 4.1 % (ref 0.0–5.0)
HEMATOCRIT: 42.8 % (ref 36.0–46.0)
HEMOGLOBIN: 14.9 g/dL (ref 12.0–15.0)
LYMPHS PCT: 30.5 % (ref 12.0–46.0)
Lymphs Abs: 1.7 10*3/uL (ref 0.7–4.0)
MCHC: 34.7 g/dL (ref 30.0–36.0)
MCV: 96.1 fl (ref 78.0–100.0)
MONO ABS: 0.4 10*3/uL (ref 0.1–1.0)
Monocytes Relative: 7.1 % (ref 3.0–12.0)
Neutro Abs: 3.1 10*3/uL (ref 1.4–7.7)
Neutrophils Relative %: 56.5 % (ref 43.0–77.0)
Platelets: 299 10*3/uL (ref 150.0–400.0)
RBC: 4.45 Mil/uL (ref 3.87–5.11)
RDW: 12.5 % (ref 11.5–15.5)
WBC: 5.4 10*3/uL (ref 4.0–10.5)

## 2017-03-27 LAB — HEPATIC FUNCTION PANEL
ALBUMIN: 4.1 g/dL (ref 3.5–5.2)
ALT: 18 U/L (ref 0–35)
AST: 12 U/L (ref 0–37)
Alkaline Phosphatase: 113 U/L (ref 39–117)
BILIRUBIN TOTAL: 0.4 mg/dL (ref 0.2–1.2)
Bilirubin, Direct: 0.1 mg/dL (ref 0.0–0.3)
Total Protein: 6.2 g/dL (ref 6.0–8.3)

## 2017-03-27 LAB — BASIC METABOLIC PANEL
BUN: 14 mg/dL (ref 6–23)
CALCIUM: 9.6 mg/dL (ref 8.4–10.5)
CO2: 31 mEq/L (ref 19–32)
CREATININE: 0.52 mg/dL (ref 0.40–1.20)
Chloride: 101 mEq/L (ref 96–112)
GFR: 127.74 mL/min (ref >60.00)
Glucose, Bld: 93 mg/dL (ref 70–99)
Potassium: 4.2 mEq/L (ref 3.5–5.1)
Sodium: 141 mEq/L (ref 135–145)

## 2017-03-27 LAB — TSH: TSH: 2.33 u[IU]/mL (ref 0.35–4.50)

## 2017-03-27 NOTE — Assessment & Plan Note (Signed)
S/p R enucleation w/ delayed healing.  Repeat surgery pending for Friday

## 2017-03-27 NOTE — Assessment & Plan Note (Signed)
Chronic problem, tolerating statin w/o difficulty.  Check labs.  Adjust meds prn  

## 2017-03-27 NOTE — Patient Instructions (Signed)
Schedule your complete physical in 6 months We'll notify you of your lab results and make any changes if needed Call with any questions or concerns GOOD LUCK WITH SURGERY!

## 2017-03-27 NOTE — Progress Notes (Signed)
   Subjective:    Patient ID: Carol Quinn, female    DOB: May 29, 1955, 62 y.o.   MRN: [id number]    Objective:    Assessment & Plan:    Subjective:    Carol Quinn is a 62 y.o. female who presents to the office today for a preoperative consultation at the request of surgeon Dr Ramonita Lab who plans on performing eye surgery on March 22. This consultation is requested for the specific conditions prompting preoperative evaluation (i.e. because of potential affect on operative risk): HTN, Hyperlipidemia. Planned anesthesia: general. The patient has the following known anesthesia issues: none. Patients bleeding risk: no recent abnormal bleeding, no remote history of abnormal bleeding and no use of Ca-channel blockers. Patient does not have objections to receiving blood products if needed.  The following portions of the patient's history were reviewed and updated as appropriate: allergies, current medications, past family history, past medical history, past social history, past surgical history and problem list.  Review of Systems A comprehensive review of systems was negative.    Objective:    BP 120/81   Pulse 72   Temp 98.1 F (36.7 C) (Oral)   Resp 16   Ht 5\' 2"  (1.575 m)   Wt 136 lb 6 oz (61.9 kg)   SpO2 97%   BMI 24.94 kg/m    AAOx3, NAD NCAT Patch over R eye Neck- normal ROM, thyroid WNL RRR, normal S1/S2 Lungs CTAB Normal BS, no abdominal TTP, no rebound, guarding, distension Distal pulses intake, no edema  Predictors of intubation difficulty:  Morbid obesity? no  Anatomically abnormal facies? no  Prominent incisors? no  Receding mandible? no  Short, thick neck? no  Neck range of motion: normal  Dentition: No chipped, loose, or missing teeth.  Cardiographics ECG: no change since previous ECG dated 2016 Echocardiogram: not done  Imaging Chest x-ray: NA   Lab Review  Labs drawn today   Assessment:      62 y.o. female with planned surgery as above.     Known risk factors for perioperative complications: Chronic pulmonary disease   Difficulty with intubation is not anticipated.  Cardiac Risk Estimation: low  Current medications which may produce withdrawal symptoms if withheld perioperatively: none     Plan:    1. Preoperative workup as follows ECG, hemoglobin, hematocrit, electrolytes, creatinine, glucose, liver function studies. 2. Change in medication regimen before surgery: none, continue medication regimen including morning of surgery, with sip of water. 3. Prophylaxis for cardiac events with perioperative beta-blockers: not indicated. 4. Invasive hemodynamic monitoring perioperatively: at the discretion of anesthesiologist. 5. Deep vein thrombosis prophylaxis postoperatively:regimen to be chosen by surgical team. 6. Surveillance for postoperative MI with ECG immediately postoperatively and on postoperative days 1 and 2 AND troponin levels 24 hours postoperatively and on day 4 or hospital discharge (whichever comes first): at the discretion of anesthesiologist. 7. Other measures: Tobacco Cessation

## 2017-04-21 ENCOUNTER — Encounter (HOSPITAL_BASED_OUTPATIENT_CLINIC_OR_DEPARTMENT_OTHER): Payer: Self-pay

## 2017-04-21 ENCOUNTER — Encounter: Payer: Self-pay | Admitting: Family

## 2017-04-21 ENCOUNTER — Ambulatory Visit (HOSPITAL_BASED_OUTPATIENT_CLINIC_OR_DEPARTMENT_OTHER)
Admission: RE | Admit: 2017-04-21 | Discharge: 2017-04-21 | Disposition: A | Discharge status: Home / Self Care | Payer: 59 | Source: Ambulatory Visit | Attending: Hematology & Oncology | Admitting: Hematology & Oncology

## 2017-04-21 ENCOUNTER — Inpatient Hospital Stay: Payer: 59 | Admitting: Family

## 2017-04-21 ENCOUNTER — Inpatient Hospital Stay (HOSPITAL_BASED_OUTPATIENT_CLINIC_OR_DEPARTMENT_OTHER): Payer: 59 | Admitting: Family

## 2017-04-21 ENCOUNTER — Other Ambulatory Visit: Payer: Self-pay

## 2017-04-21 ENCOUNTER — Inpatient Hospital Stay: Payer: 59 | Attending: Hematology & Oncology

## 2017-04-21 VITALS — BP 124/66 | HR 82 | Temp 97.6°F | Resp 19 | Wt 136.0 lb

## 2017-04-21 DIAGNOSIS — J40 Bronchitis, not specified as acute or chronic: Secondary | ICD-10-CM | POA: Insufficient documentation

## 2017-04-21 DIAGNOSIS — C6931 Malignant neoplasm of right choroid: Secondary | ICD-10-CM

## 2017-04-21 DIAGNOSIS — Z85840 Personal history of malignant neoplasm of eye: Secondary | ICD-10-CM | POA: Insufficient documentation

## 2017-04-21 DIAGNOSIS — J439 Emphysema, unspecified: Secondary | ICD-10-CM | POA: Insufficient documentation

## 2017-04-21 DIAGNOSIS — J449 Chronic obstructive pulmonary disease, unspecified: Secondary | ICD-10-CM | POA: Insufficient documentation

## 2017-04-21 DIAGNOSIS — I700 Atherosclerosis of aorta: Secondary | ICD-10-CM | POA: Diagnosis not present

## 2017-04-21 DIAGNOSIS — I2510 Atherosclerotic heart disease of native coronary artery without angina pectoris: Secondary | ICD-10-CM | POA: Insufficient documentation

## 2017-04-21 LAB — CBC WITH DIFFERENTIAL (CANCER CENTER ONLY)
Basophils Absolute: 0.1 10*3/uL (ref 0.0–0.1)
Basophils Relative: 1 %
EOS ABS: 0.2 10*3/uL (ref 0.0–0.5)
EOS PCT: 4 %
HCT: 42 % (ref 34.8–46.6)
Hemoglobin: 13.8 g/dL (ref 11.6–15.9)
LYMPHS PCT: 32 %
Lymphs Abs: 1.6 10*3/uL (ref 0.9–3.3)
MCH: 32.6 pg (ref 26.0–34.0)
MCHC: 32.9 g/dL (ref 32.0–36.0)
MCV: 99.3 fL (ref 81.0–101.0)
MONOS PCT: 11 %
Monocytes Absolute: 0.5 10*3/uL (ref 0.1–0.9)
Neutro Abs: 2.6 10*3/uL (ref 1.5–6.5)
Neutrophils Relative %: 52 %
PLATELETS: 273 10*3/uL (ref 145–400)
RBC: 4.23 MIL/uL (ref 3.70–5.32)
RDW: 12.6 % (ref 11.1–15.7)
WBC Count: 5.1 10*3/uL (ref 3.9–10.0)

## 2017-04-21 LAB — CMP (CANCER CENTER ONLY)
ALK PHOS: 99 U/L (ref 40–150)
ALT: 28 U/L (ref 0–55)
ANION GAP: 8 (ref 3–11)
AST: 22 U/L (ref 5–34)
Albumin: 3.5 g/dL (ref 3.5–5.0)
BUN: 13 mg/dL (ref 7–26)
CALCIUM: 9.1 mg/dL (ref 8.4–10.4)
CO2: 28 mmol/L (ref 22–29)
CREATININE: 0.75 mg/dL (ref 0.60–1.10)
Chloride: 102 mmol/L (ref 98–109)
Glucose, Bld: 80 mg/dL (ref 70–140)
Potassium: 4.6 mmol/L (ref 3.5–5.1)
SODIUM: 138 mmol/L (ref 136–145)
Total Bilirubin: <0.2 mg/dL — ABNORMAL LOW (ref 0.2–1.2)
Total Protein: 6.2 g/dL — ABNORMAL LOW (ref 6.4–8.3)

## 2017-04-21 LAB — LACTATE DEHYDROGENASE: LDH: 152 U/L (ref 125–245)

## 2017-04-21 MED ORDER — IOPAMIDOL (ISOVUE-300) INJECTION 61%
100.0000 mL | Freq: Once | INTRAVENOUS | Status: AC | PRN
  Administered 2017-04-21: 100 mL via INTRAVENOUS

## 2017-04-21 NOTE — Progress Notes (Signed)
error 

## 2017-04-21 NOTE — Progress Notes (Signed)
Hematology and Oncology Follow Up Visit  Carol Quinn [id number] 14-May-1955 62 y.o. 04/21/2017   Principle Diagnosis:  Choroidal melanoma of the right eye, status post right eye enucleation COPD-chronic bronchitis  Current Therapy:   Observation   Interim History:  Carol Quinn is here today for follow-up. She is doing fairly well. She has had issues with her implant in the right eye healing. She has had 2 surgeries since we last saw her and will be having another on April 23rd with Dr. Ramonita Lab. Donor tissue with her last surgery did not help so with her next procedure they plan on taking her own stomach tissue to use as a graft. She has clear as well as mucus drainage from the eye at times.  CT scans today showed no acute process or evidence of metastatic disease within the chest abdomen or pelvis.  No lymphadenopathy found on exam.  No fever, chills, n/v, cough, rash, dizziness, chest pain, palpitations, abdominal pain or changes in bowel or bladder habits.  She feels that her SOB has been a little more prominent. She has also noted fatigue she states from the eye surgeries and also having had a colonoscopy with Dr. Lenna Sciara. Pyrtle several weeks ago. She states that she had 2 precancerous polyps removed.  She has had no episodes of bleeding, no bruising or petechiae. No lymphadenopathy found on exam.  She has noticed that with her fatigue her appetite has decreased. She admits that she needs to hydrate better at home. Her weight is stable.   ECOG Performance Status: 1 - Symptomatic but completely ambulatory  Medications:  Allergies as of 04/21/2017      Reactions   Clarithromycin Diarrhea, Rash, Other (See Comments)   Severe diarrhea  Rash Other reaction(s): DIARRHEA   Hydrocodone-homatropine Other (See Comments)   paranoia   Hydrocodone-homatropine Other (See Comments)   paranoia   Penicillins Other (See Comments)   Unknown reaction, childhood allergy Has patient had a PCN  reaction causing immediate rash, facial/tongue/throat swelling, SOB or lightheadedness with hypotension: Unknown Has patient had a PCN reaction causing severe rash involving mucus membranes or skin necrosis: Unknown Has patient had a PCN reaction that required hospitalization: Unknown Has patient had a PCN reaction occurring within the last 10 years: No If all of the above answers are "NO", then may proceed with Cephalosporin use. unknown   Topiramate Other (See Comments)   WEIGHT LOSS Other reaction(s): WEIGHT LOSS   Codeine Itching      Medication List        Accurate as of 04/21/17 11:01 AM. Always use your most recent med list.          albuterol (2.5 MG/3ML) 0.083% nebulizer solution Commonly known as:  PROVENTIL Take 2.5 mg by nebulization every 6 (six) hours as needed for wheezing or shortness of breath.   albuterol 108 (90 Base) MCG/ACT inhaler Commonly known as:  PROVENTIL HFA;VENTOLIN HFA Inhale 2 puffs into the lungs every 6 (six) hours as needed for wheezing or shortness of breath.   atorvastatin 20 MG tablet Commonly known as:  LIPITOR TAKE 1 TABLET BY MOUTH ONCE DAILY   buPROPion 150 MG 24 hr tablet Commonly known as:  WELLBUTRIN XL TAKE 1 TABLET BY MOUTH DAILY   erythromycin ophthalmic ointment Place 1 application into the right eye 3 (three) times daily.   escitalopram 20 MG tablet Commonly known as:  LEXAPRO Take 1 tablet (20 mg total) by mouth daily. Please call [phone] to schedule  a Blood Pressure follow up.   estradiol 0.1 MG/24HR patch Commonly known as:  VIVELLE-DOT Place 1 patch onto the skin 2 (two) times a week.   fexofenadine 180 MG tablet Commonly known as:  ALLEGRA Take 180 mg by mouth daily.   FLUTTER Devi Use as directed   Glycopyrrolate-Formoterol 9-4.8 MCG/ACT Aero Commonly known as:  BEVESPI AEROSPHERE Inhale 2 puffs into the lungs 2 (two) times daily.   losartan-hydrochlorothiazide 100-25 MG tablet Commonly known as:   HYZAAR TAKE 1 TABLET BY MOUTH DAILY   modafinil 200 MG tablet Commonly known as:  PROVIGIL TAKE ONE TABLET BY MOUTH DAILY   montelukast 10 MG tablet Commonly known as:  SINGULAIR Take 1 tablet (10 mg total) by mouth at bedtime.   moxifloxacin 0.5 % ophthalmic solution Commonly known as:  VIGAMOX   neomycin-bacitracin-polymyxin ophthalmic ointment Commonly known as:  NEOSPORIN Place 1 application into the right eye 4 (four) times daily.   neomycin-polymyxin b-dexamethasone 3.5-10000-0.1 Susp Commonly known as:  MAXITROL   omeprazole 40 MG capsule Commonly known as:  PRILOSEC TAKE 1 CAPSULE BY MOUTH ONCE DAILY   potassium chloride SA 20 MEQ tablet Commonly known as:  K-DUR,KLOR-CON TAKE 1 TABLET BY MOUTH DAILY   PREVIDENT 5000 PLUS 1.1 % Crea dental cream Generic drug:  sodium fluoride Place 1 application onto teeth at bedtime.   SUMAtriptan 100 MG tablet Commonly known as:  IMITREX Take one tablet at onset of migraine, repeat in 2 hours if needed.   traMADol 50 MG tablet Commonly known as:  ULTRAM Take 50 mg by mouth every 8 (eight) hours as needed for moderate pain.   zolmitriptan 5 MG nasal solution Commonly known as:  ZOMIG Place 1 spray into the nose as needed for migraine.       Allergies:  Allergies  Allergen Reactions  . Clarithromycin Diarrhea, Rash and Other (See Comments)    Severe diarrhea  Rash Other reaction(s): DIARRHEA  . Hydrocodone-Homatropine Other (See Comments)    paranoia  . Hydrocodone-Homatropine Other (See Comments)    paranoia  . Penicillins Other (See Comments)    Unknown reaction, childhood allergy Has patient had a PCN reaction causing immediate rash, facial/tongue/throat swelling, SOB or lightheadedness with hypotension: Unknown Has patient had a PCN reaction causing severe rash involving mucus membranes or skin necrosis: Unknown Has patient had a PCN reaction that required hospitalization: Unknown Has patient had a PCN  reaction occurring within the last 10 years: No If all of the above answers are "NO", then may proceed with Cephalosporin use.  unknown  . Topiramate Other (See Comments)    WEIGHT LOSS Other reaction(s): WEIGHT LOSS  . Codeine Itching    Past Medical History, Surgical history, Social history, and Family History were reviewed and updated.  Review of Systems: All other 10 point review of systems is negative.   Physical Exam:  vitals were not taken for this visit.   Wt Readings from Last 3 Encounters:  04/21/17 136 lb (61.7 kg)  03/27/17 136 lb 6 oz (61.9 kg)  03/21/17 135 lb (61.2 kg)    Ocular: Sclera unicteric, good pupillary reflex, round and reactive to light Ear-nose-throat: Oropharynx clear, dentition fair Lymphatic: No cervical, supraclavicular or axillary adenopathy Lungs no rales or rhonchi, good excursion bilaterally Heart regular rate and rhythm, no murmur appreciated Abd soft, nontender, positive bowel sounds, no liver or spleen tip palpated on exam, no fluid wave MSK no focal spinal tenderness, no joint edema Neuro: non-focal, well-oriented, appropriate  affect Breasts: Deferred   Lab Results  Component Value Date   WBC 5.1 04/21/2017   HGB 14.9 03/27/2017   HCT 42.0 04/21/2017   MCV 99.3 04/21/2017   PLT 273 04/21/2017   No results found for: FERRITIN, IRON, TIBC, UIBC, IRONPCTSAT Lab Results  Component Value Date   RBC 4.23 04/21/2017   No results found for: KPAFRELGTCHN, LAMBDASER, KAPLAMBRATIO No results found for: IGGSERUM, IGA, IGMSERUM No results found for: Odetta Pink, SPEI   Chemistry      Component Value Date/Time   NA 141 03/27/2017 1426   NA 143 12/23/2016 0848   K 4.2 03/27/2017 1426   K 4.3 12/23/2016 0848   CL 101 03/27/2017 1426   CL 99 12/23/2016 0848   CO2 31 03/27/2017 1426   CO2 31 12/23/2016 0848   BUN 14 03/27/2017 1426   BUN 10 12/23/2016 0848   CREATININE 0.52  03/27/2017 1426   CREATININE 1.0 12/23/2016 0848      Component Value Date/Time   CALCIUM 9.6 03/27/2017 1426   CALCIUM 9.4 12/23/2016 0848   ALKPHOS 113 03/27/2017 1426   ALKPHOS 99 (H) 12/23/2016 0848   AST 12 03/27/2017 1426   AST 20 12/23/2016 0848   ALT 18 03/27/2017 1426   ALT 26 12/23/2016 0848   BILITOT 0.4 03/27/2017 1426   BILITOT 0.50 12/23/2016 0848      Impression and Plan: Ms. Cagley is a very pleasant 62 yo caucasian female with a choroidal melanoma of the right eye with right eye enucleation in October 2017. So far there has been no evidence of recurrent disease. Her CT scans today showed no acute process or indication of metastatic disease within the chest abdomen or pelvis.  She will have her 3rd surgery later this month to help her implant heal.  We will plan to see her back in another 4 months for follow-up, scans and lab.  She will contact our office with any questions or concerns. We can certainly see her sooner if need be.   Laverna Peace, NP 4/12/201911:01 AM

## 2017-04-24 ENCOUNTER — Encounter: Payer: Self-pay | Admitting: *Deleted

## 2017-05-09 ENCOUNTER — Telehealth: Payer: Self-pay | Admitting: Family Medicine

## 2017-05-09 NOTE — Telephone Encounter (Signed)
Copied from [place] [id number]. Topic: Quick Communication - See Telephone Encounter >> May 09, 2017  2:53 PM Ether Griffins B wrote: CRM for notification. See Telephone encounter for: 05/09/17.  Pt is needing another surgical clearance for eye surgery on 05/18/17 for skin graft. Pt was just in the office on 3/18 for one. But her eye doctor states where it is 85 days old they need a new one. She is wondering if another letter can be wrote or if she needs to come in to the office again.

## 2017-05-09 NOTE — Telephone Encounter (Signed)
Please advise? Ok to write a new letter for surgical clearance?

## 2017-05-10 ENCOUNTER — Encounter: Payer: Self-pay | Admitting: General Practice

## 2017-05-10 NOTE — Telephone Encounter (Signed)
Letter written, given to PCP for signature. Will advise pt available at front desk for pick up.

## 2017-05-10 NOTE — Telephone Encounter (Signed)
Ok to write new letter for pt without repeat OV

## 2017-05-11 ENCOUNTER — Other Ambulatory Visit: Payer: Self-pay | Admitting: Family Medicine

## 2017-05-11 ENCOUNTER — Other Ambulatory Visit: Payer: Self-pay | Admitting: Internal Medicine

## 2017-05-11 NOTE — Telephone Encounter (Signed)
Please advise?   Copied from [place] [id number]. Topic: Inquiry >> May 10, 2017  1:36 PM Pricilla Handler wrote: Reason for CRM: Santiago Glad with Dr[facility] (PHN # [phone]) called requesting the office notes for patient's surgical clearance appt today. [place] states that they must have these notes before patient's eye surgery. Please fax the notes to FAX # [phone]. Notes are needed asap.       Thank You!!!

## 2017-05-11 NOTE — Telephone Encounter (Signed)
Called to speak with surgeon's office and office was closed will call again tomorrow.

## 2017-05-11 NOTE — Telephone Encounter (Signed)
Pt was cleared 03/27/17- no need for repeat surgical clearance within such a short time

## 2017-05-12 NOTE — Telephone Encounter (Signed)
Ok to refill total 6 months 

## 2017-05-12 NOTE — Telephone Encounter (Signed)
Office returned call. The nurse Mickel Baas was not friendly. She advised that per the hospital the patient must be seen every 30 days for clearance. I advised that per Dr. Birdie Riddle the pt's surgical clearance was good for at least 6 months since everything was normal with Bloodwork and EKG.   Mickel Baas advised that this was not their rules but the hospitals. I asked for her to send me the documentation from the hospital that advised pt must be seen every 30 days and to refax surgical clearance paperwork for completion. She was not happy but was going to oblige.

## 2017-05-12 NOTE — Telephone Encounter (Signed)
Received refill request for Modafinil 200mg  (1 tab QD). Last filled on 11/14/2016 by [facility] for #30 with 5 refills.  Pt last seen on 01/31/2017 and has pending appt with [facility] on 05/31/2017.  Dr. Annamaria Boots please advise on refill. Thanks.   Allergies  Allergen Reactions  . Clarithromycin Diarrhea, Rash and Other (See Comments)    Severe diarrhea  Rash Other reaction(s): DIARRHEA  . Hydrocodone-Homatropine Other (See Comments)    paranoia  . Hydrocodone-Homatropine Other (See Comments)    paranoia  . Penicillins Other (See Comments)    Unknown reaction, childhood allergy Has patient had a PCN reaction causing immediate rash, facial/tongue/throat swelling, SOB or lightheadedness with hypotension: Unknown Has patient had a PCN reaction causing severe rash involving mucus membranes or skin necrosis: Unknown Has patient had a PCN reaction that required hospitalization: Unknown Has patient had a PCN reaction occurring within the last 10 years: No If all of the above answers are "NO", then may proceed with Cephalosporin use.  unknown  . Topiramate Other (See Comments)    WEIGHT LOSS Other reaction(s): WEIGHT LOSS  . Codeine Itching    Current Outpatient Medications on File Prior to Visit  Medication Sig Dispense Refill  . albuterol (PROVENTIL HFA;VENTOLIN HFA) 108 (90 Base) MCG/ACT inhaler Inhale 2 puffs into the lungs every 6 (six) hours as needed for wheezing or shortness of breath. 1 Inhaler 12  . albuterol (PROVENTIL) (2.5 MG/3ML) 0.083% nebulizer solution Take 2.5 mg by nebulization every 6 (six) hours as needed for wheezing or shortness of breath.    Marland Kitchen buPROPion (WELLBUTRIN XL) 150 MG 24 hr tablet TAKE 1 TABLET BY MOUTH DAILY (Patient taking differently: TAKE 150 MG BY MOUTH DAILY) 30 tablet 6  . erythromycin ophthalmic ointment Place 1 application into the right eye 3 (three) times daily.    Marland Kitchen escitalopram (LEXAPRO) 20 MG tablet Take 1 tablet (20 mg total) by mouth daily. Please call [phone]  to schedule a Blood Pressure follow up. 30 tablet 6  . estradiol (VIVELLE-DOT) 0.1 MG/24HR patch Place 1 patch onto the skin 2 (two) times a week.     . fexofenadine (ALLEGRA) 180 MG tablet Take 180 mg by mouth daily.    . Glycopyrrolate-Formoterol (BEVESPI AEROSPHERE) 9-4.8 MCG/ACT AERO Inhale 2 puffs into the lungs 2 (two) times daily. 1 Inhaler 12  . losartan-hydrochlorothiazide (HYZAAR) 100-25 MG tablet TAKE 1 TABLET BY MOUTH DAILY 30 tablet 6  . modafinil (PROVIGIL) 200 MG tablet TAKE ONE TABLET BY MOUTH DAILY (Patient taking differently: TAKE 200 MG BY MOUTH DAILY) 30 tablet 5  . moxifloxacin (VIGAMOX) 0.5 % ophthalmic solution     . neomycin-bacitracin-polymyxin (NEOSPORIN) ophthalmic ointment Place 1 application into the right eye 4 (four) times daily.    Marland Kitchen neomycin-polymyxin b-dexamethasone (MAXITROL) 3.5-10000-0.1 SUSP     . omeprazole (PRILOSEC) 40 MG capsule TAKE 1 CAPSULE BY MOUTH ONCE DAILY (Patient taking differently: TAKE 40 MG BY MOUTH ONCE DAILY) 30 capsule 6  . potassium chloride SA (K-DUR,KLOR-CON) 20 MEQ tablet TAKE 1 TABLET BY MOUTH DAILY (Patient taking differently: TAKE 20 MEQ BY MOUTH DAILY) 30 tablet 6  . PREVIDENT 5000 PLUS 1.1 % CREA dental cream Place 1 application onto teeth at bedtime.     Marland Kitchen Respiratory Therapy Supplies (FLUTTER) DEVI Use as directed (Patient not taking: Reported on 03/09/2017) 1 each 0  . SUMAtriptan (IMITREX) 100 MG tablet Take one tablet at onset of migraine, repeat in 2 hours if needed. (Patient taking differently: Take 100 mg  by mouth every 2 (two) hours as needed for migraine or headache. ) 10 tablet 6  . traMADol (ULTRAM) 50 MG tablet Take 50 mg by mouth every 8 (eight) hours as needed for moderate pain.    Marland Kitchen zolmitriptan (ZOMIG) 5 MG nasal solution Place 1 spray into the nose as needed for migraine.      No current facility-administered medications on file prior to visit.

## 2017-05-31 ENCOUNTER — Ambulatory Visit: Payer: 59 | Admitting: Internal Medicine

## 2017-05-31 ENCOUNTER — Encounter: Payer: Self-pay | Admitting: Internal Medicine

## 2017-05-31 VITALS — BP 118/70 | HR 116 | Ht 62.0 in | Wt 134.6 lb

## 2017-05-31 DIAGNOSIS — J9611 Chronic respiratory failure with hypoxia: Secondary | ICD-10-CM | POA: Diagnosis not present

## 2017-05-31 DIAGNOSIS — Z720 Tobacco use: Secondary | ICD-10-CM

## 2017-05-31 DIAGNOSIS — J441 Chronic obstructive pulmonary disease with (acute) exacerbation: Secondary | ICD-10-CM

## 2017-05-31 MED ORDER — VARENICLINE TARTRATE 0.5 MG X 11 & 1 MG X 42 PO MISC: ORAL | 1 refills | Status: DC

## 2017-05-31 MED ORDER — FLUTICASONE-UMECLIDIN-VILANT 100-62.5-25 MCG/INH IN AEPB: 1.0000 | INHALATION_SPRAY | Freq: Every day | RESPIRATORY_TRACT | 0 refills | Status: DC

## 2017-05-31 MED ORDER — FLUTICASONE-UMECLIDIN-VILANT 100-62.5-25 MCG/INH IN AEPB: 1.0000 | INHALATION_SPRAY | Freq: Every day | RESPIRATORY_TRACT | 12 refills | Status: DC

## 2017-05-31 NOTE — Patient Instructions (Addendum)
Sample and print script for Trelegy    Inhale 1 puff, then rinse mouth, once daily.   Script printed for Chantix  We can continue oxygen as discussed  Please call if I can help

## 2017-06-05 ENCOUNTER — Encounter: Payer: Self-pay | Admitting: Internal Medicine

## 2017-06-05 NOTE — Assessment & Plan Note (Signed)
Nonspecific exacerbation may reflect weather changes.  I do not think she has an infection now. Plan-try again with Trelegy, refill nebulizer solution

## 2017-06-05 NOTE — Progress Notes (Signed)
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 ----Pt is wheezing, SOB w/exertion. Pt is not currently using O2 at 2 liters with exertion todays OV. Pt has productive cough- yellow with some green. Pt states been feeling sick for 6 weeks. Flutter device, Singulair, Bevespi, nebulizer albuterol Lingering nasal congestion and mild productive cough since bronchitis in December.  No fever or chills.  Still smoking some-discussed.  Wheezes with exertion.  Sleeps with oxygen and uses it as needed in the daytime.  Pending colonoscopy tomorrow.  Pending office surgery to repair a defect in enucleated socket which has gotten infected.  Her ophthalmologist is starting her on clindamycin 100 mg 3 times daily. We discussed alternatives to current respiratory meds. CT chest 12/23/16 Lungs/Pleura: Centrilobular emphysema. Stable biapical pleuroparenchymal scarring. Airway thickening is present, suggesting bronchitis or reactive airways disease. No findings of metastatic disease to the lungs.  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 Pt has not smoked since 05-27-17.  Modafinil, Bevespi, albuterol hfa, singulair,  Has not smoked in the past week-strongly encouraged to stick with it.  Discussed Chantix. Has had 1 to go for eye surgeries revising after enucleation for melanoma. Some increased shortness of breath about a month ago.  Cough mostly dry throat clearing.  Some wheeze.  Using rescue inhaler several times daily with Bevespi twice daily.  Needs refill nebulizer medication CT chest/12/19- IMPRESSION: 1. No acute process or evidence of metastatic disease within the chest, abdomen, or pelvis. 2. No explanation for elevated liver function tests. 3. Age advanced coronary artery atherosclerosis. Recommend assessment of coronary risk factors and consideration of medical therapy. 4. Aortic atherosclerosis (ICD10-I70.0) and emphysema (ICD10-J43.9).  ROS-see HPI   + = positive Constitutional:   No-   weight loss, night sweats, fevers, chills, +fatigue, lassitude. HEENT:   No-  headaches, difficulty swallowing, tooth/dental problems, sore throat,       No-  sneezing, itching, ear ache, +nasal congestion, +post nasal drip,  CV:  No-   chest pain, orthopnea, PND, swelling in lower extremities, anasarca, dizziness, palpitations Resp: + shortness of breath with exertion or at rest.                productive cough,  + non-productive cough,  No- coughing up of blood.               change in color of mucus.  + wheezing.   Skin: No-   rash or lesions. GI:  No-   heartburn, indigestion, abdominal pain, nausea, vomiting,  GU:  MS:  No-   joint pain or swelling.   Neuro-     nothing unusual Psych:  No- change  in mood or affect. No depression or anxiety.  No memory loss.  OBJ-  Physical Exam   General- Alert, Oriented, Affect-appropriate,   Skin- clear  Lymphadenopathy- none Head- atraumatic            Eyes- + R prosthesis-wiping corner of eye frequently            Ears- Hearing, canals-normal            Nose- clear, no-Septal dev, mucus, polyps, erosion, perforation             Throat- Mallampati IV , mucosa clear/ not red , drainage- none, tonsils- small residual Neck- flexible , trachea midline, no stridor , thyroid nl, carotid no bruit Chest - symmetrical excursion , unlabored           Heart/CV- RRR , no murmur , no gallop  , no rub, nl s1 s2                           - JVD- none , edema- none, stasis changes- none, varices- none           Lung- + wheeze, cough-none , dullness-none, rub- none,                                + distant breath sounds           Chest wall-  Abd-  Br/ Gen/ Rectal- Not done, not indicated Extrem- cyanosis- none, clubbing, none, atrophy- none, strength- nl Neuro- grossly intact to observation

## 2017-06-05 NOTE — Assessment & Plan Note (Signed)
Depends on oxygen at night and uses it when needed during the day for increased activity

## 2017-06-05 NOTE — Assessment & Plan Note (Signed)
Strongly support smoking cessation.  Discussed. Plan-Chantix

## 2017-06-13 ENCOUNTER — Other Ambulatory Visit: Payer: Self-pay | Admitting: Family Medicine

## 2017-06-26 ENCOUNTER — Telehealth: Payer: Self-pay | Admitting: Internal Medicine

## 2017-06-26 MED ORDER — ALBUTEROL SULFATE (2.5 MG/3ML) 0.083% IN NEBU: 2.5000 mg | INHALATION_SOLUTION | Freq: Four times a day (QID) | RESPIRATORY_TRACT | 11 refills | Status: DC | PRN

## 2017-06-26 MED ORDER — FLUTICASONE-UMECLIDIN-VILANT 100-62.5-25 MCG/INH IN AEPB: 1.0000 | INHALATION_SPRAY | Freq: Every day | RESPIRATORY_TRACT | 11 refills | Status: DC

## 2017-06-26 MED ORDER — VARENICLINE TARTRATE 0.5 MG X 11 & 1 MG X 42 PO MISC: ORAL | 0 refills | Status: DC

## 2017-06-26 NOTE — Addendum Note (Signed)
Addended by: Desmond Dike C on: 06/26/2017 12:42 PM   Modules accepted: Orders

## 2017-06-26 NOTE — Telephone Encounter (Signed)
Spoke with the pt to verify her msg  She is needing chantix starter pack, albuterol neb sol and trelegy  Rxs sent and nothing further needed

## 2017-07-21 ENCOUNTER — Other Ambulatory Visit: Payer: Self-pay | Admitting: Family Medicine

## 2017-07-21 ENCOUNTER — Other Ambulatory Visit: Payer: Self-pay | Admitting: Internal Medicine

## 2017-07-21 NOTE — Telephone Encounter (Signed)
Dr. Annamaria Boots, please advise if it is okay to refill pt's chantix script?

## 2017-07-21 NOTE — Telephone Encounter (Signed)
Yes ok 

## 2017-07-26 ENCOUNTER — Telehealth: Payer: Self-pay | Admitting: Internal Medicine

## 2017-07-26 MED ORDER — VARENICLINE TARTRATE 1 MG PO TABS: 1.0000 mg | ORAL_TABLET | Freq: Two times a day (BID) | ORAL | 2 refills | Status: DC

## 2017-07-26 NOTE — Telephone Encounter (Signed)
Spoke with pt. She is requesting a new prescription be sent in for a Chantix continuation pack. Rx has been sent in. Nothing further was needed.

## 2017-07-31 ENCOUNTER — Ambulatory Visit: Payer: 59 | Admitting: Internal Medicine

## 2017-08-21 ENCOUNTER — Ambulatory Visit (HOSPITAL_BASED_OUTPATIENT_CLINIC_OR_DEPARTMENT_OTHER)
Admission: RE | Admit: 2017-08-21 | Discharge: 2017-08-21 | Disposition: A | Discharge status: Home / Self Care | Payer: 59 | Source: Ambulatory Visit | Attending: Family | Admitting: Family

## 2017-08-21 ENCOUNTER — Encounter (HOSPITAL_BASED_OUTPATIENT_CLINIC_OR_DEPARTMENT_OTHER): Payer: Self-pay

## 2017-08-21 ENCOUNTER — Inpatient Hospital Stay (HOSPITAL_BASED_OUTPATIENT_CLINIC_OR_DEPARTMENT_OTHER): Payer: 59 | Admitting: Family

## 2017-08-21 ENCOUNTER — Other Ambulatory Visit: Payer: Self-pay

## 2017-08-21 ENCOUNTER — Encounter: Payer: Self-pay | Admitting: Family

## 2017-08-21 ENCOUNTER — Inpatient Hospital Stay: Payer: 59 | Attending: Hematology & Oncology

## 2017-08-21 VITALS — BP 128/60 | HR 70 | Temp 97.7°F | Resp 18 | Wt 144.0 lb

## 2017-08-21 DIAGNOSIS — Z85840 Personal history of malignant neoplasm of eye: Secondary | ICD-10-CM | POA: Diagnosis not present

## 2017-08-21 DIAGNOSIS — C6931 Malignant neoplasm of right choroid: Secondary | ICD-10-CM | POA: Insufficient documentation

## 2017-08-21 DIAGNOSIS — I2510 Atherosclerotic heart disease of native coronary artery without angina pectoris: Secondary | ICD-10-CM | POA: Insufficient documentation

## 2017-08-21 DIAGNOSIS — I700 Atherosclerosis of aorta: Secondary | ICD-10-CM | POA: Diagnosis not present

## 2017-08-21 DIAGNOSIS — D3502 Benign neoplasm of left adrenal gland: Secondary | ICD-10-CM | POA: Insufficient documentation

## 2017-08-21 DIAGNOSIS — J439 Emphysema, unspecified: Secondary | ICD-10-CM | POA: Insufficient documentation

## 2017-08-21 LAB — CBC WITH DIFFERENTIAL (CANCER CENTER ONLY)
BASOS PCT: 1 %
Basophils Absolute: 0.1 10*3/uL (ref 0.0–0.1)
Eosinophils Absolute: 0.3 10*3/uL (ref 0.0–0.5)
Eosinophils Relative: 6 %
HEMATOCRIT: 39 % (ref 34.8–46.6)
HEMOGLOBIN: 12.8 g/dL (ref 11.6–15.9)
Lymphocytes Relative: 28 %
Lymphs Abs: 1.5 10*3/uL (ref 0.9–3.3)
MCH: 33.2 pg (ref 26.0–34.0)
MCHC: 32.8 g/dL (ref 32.0–36.0)
MCV: 101 fL (ref 81.0–101.0)
MONOS PCT: 10 %
Monocytes Absolute: 0.5 10*3/uL (ref 0.1–0.9)
NEUTROS ABS: 3 10*3/uL (ref 1.5–6.5)
NEUTROS PCT: 55 %
Platelet Count: 284 10*3/uL (ref 145–400)
RBC: 3.86 MIL/uL (ref 3.70–5.32)
RDW: 12.5 % (ref 11.1–15.7)
WBC Count: 5.4 10*3/uL (ref 3.9–10.0)

## 2017-08-21 LAB — CMP (CANCER CENTER ONLY)
ALBUMIN: 3.4 g/dL — AB (ref 3.5–5.0)
ALK PHOS: 91 U/L — AB (ref 26–84)
ALT: 22 U/L (ref 10–47)
AST: 21 U/L (ref 11–38)
Anion gap: 7 (ref 5–15)
BILIRUBIN TOTAL: 0.4 mg/dL (ref 0.2–1.6)
BUN: 17 mg/dL (ref 7–22)
CALCIUM: 9.4 mg/dL (ref 8.0–10.3)
CHLORIDE: 101 mmol/L (ref 98–108)
CO2: 31 mmol/L (ref 18–33)
CREATININE: 0.6 mg/dL (ref 0.60–1.20)
Glucose, Bld: 105 mg/dL (ref 73–118)
Potassium: 4 mmol/L (ref 3.3–4.7)
Sodium: 139 mmol/L (ref 128–145)
TOTAL PROTEIN: 6.3 g/dL — AB (ref 6.4–8.1)

## 2017-08-21 LAB — LACTATE DEHYDROGENASE: LDH: 171 U/L (ref 98–192)

## 2017-08-21 MED ORDER — IOPAMIDOL (ISOVUE-300) INJECTION 61%
100.0000 mL | Freq: Once | INTRAVENOUS | Status: AC | PRN
  Administered 2017-08-21: 100 mL via INTRAVENOUS

## 2017-08-21 NOTE — Progress Notes (Signed)
Hematology and Oncology Follow Up Visit  Carol Quinn [id number] Oct 31, 1955 62 y.o. 08/21/2017   Principle Diagnosis:  Choroidal melanoma of the right eye, status post right eye enucleation COPD-chronic bronchitis  Current Therapy:   Observation   Interim History:  Carol Quinn is here today for follow-up. She is doing well. CT scans today show no evidence of metastatic disease.  Her right eye is healing nicely after 4 surgeries and she goes for her final prosthesis fitting next week and hopes to be able to take it home.  She has had some SOB at times due to emphysema. She will take breaks to rest as needed.  No fever, chills, n/v, cough, rash, dizziness, chest pain, palpitations, abdominal pain, changes in bowel or bladder habits.  No swelling, tenderness, numbness or tingling in her extremities.  No falls or syncopal episodes to report.  No lymphadenopathy.  She has maintained a good appetite and is staying well hydrated. Her weight is stable.   ECOG Performance Status: 1 - Symptomatic but completely ambulatory  Medications:  Allergies as of 08/21/2017      Reactions   Clarithromycin Diarrhea, Rash, Other (See Comments)   Severe diarrhea  Rash Other reaction(s): DIARRHEA   Hydrocodone-homatropine Other (See Comments)   paranoia   Hydrocodone-homatropine Other (See Comments)   paranoia   Penicillins Other (See Comments)   Unknown reaction, childhood allergy Has patient had a PCN reaction causing immediate rash, facial/tongue/throat swelling, SOB or lightheadedness with hypotension: Unknown Has patient had a PCN reaction causing severe rash involving mucus membranes or skin necrosis: Unknown Has patient had a PCN reaction that required hospitalization: Unknown Has patient had a PCN reaction occurring within the last 10 years: No If all of the above answers are "NO", then may proceed with Cephalosporin use. unknown   Topiramate Other (See Comments)   WEIGHT LOSS Other  reaction(s): WEIGHT LOSS   Codeine Itching      Medication List        Accurate as of 08/21/17  9:32 AM. Always use your most recent med list.          albuterol 108 (90 Base) MCG/ACT inhaler Commonly known as:  PROVENTIL HFA;VENTOLIN HFA Inhale 2 puffs into the lungs every 6 (six) hours as needed for wheezing or shortness of breath.   albuterol (2.5 MG/3ML) 0.083% nebulizer solution Commonly known as:  PROVENTIL Take 3 mLs (2.5 mg total) by nebulization every 6 (six) hours as needed for wheezing or shortness of breath.   atorvastatin 20 MG tablet Commonly known as:  LIPITOR TAKE 1 TABLET BY MOUTH ONCE DAILY   buPROPion 150 MG 24 hr tablet Commonly known as:  WELLBUTRIN XL TAKE 150 MG BY MOUTH DAILY   erythromycin ophthalmic ointment Place 1 application into the right eye 3 (three) times daily.   escitalopram 20 MG tablet Commonly known as:  LEXAPRO Take 1 tablet (20 mg total) by mouth daily. Please call [phone] to schedule a Blood Pressure follow up.   estradiol 0.1 MG/24HR patch Commonly known as:  VIVELLE-DOT Place 1 patch onto the skin 2 (two) times a week.   fexofenadine 180 MG tablet Commonly known as:  ALLEGRA Take 180 mg by mouth daily.   Fluticasone-Umeclidin-Vilant 100-62.5-25 MCG/INH Aepb Inhale 1 puff into the lungs daily.   FLUTTER Devi Use as directed   Glycopyrrolate-Formoterol 9-4.8 MCG/ACT Aero Inhale 2 puffs into the lungs 2 (two) times daily.   losartan-hydrochlorothiazide 100-25 MG tablet Commonly known as:  HYZAAR  TAKE 1 TABLET BY MOUTH ONCE DAILY   modafinil 200 MG tablet Commonly known as:  PROVIGIL TAKE 1 TABLET BY MOUTH EVERY DAY   montelukast 10 MG tablet Commonly known as:  SINGULAIR TAKE 1 TABLET BY MOUTH AT BEDTIME   omeprazole 40 MG capsule Commonly known as:  PRILOSEC TAKE 1 CAPSULE BY MOUTH DAILY   potassium chloride SA 20 MEQ tablet Commonly known as:  K-DUR,KLOR-CON TAKE 1 TABLET BY MOUTH DAILY   PREVIDENT 5000  PLUS 1.1 % Crea dental cream Generic drug:  sodium fluoride Place 1 application onto teeth at bedtime.   SUMAtriptan 100 MG tablet Commonly known as:  IMITREX Take one tablet at onset of migraine, repeat in 2 hours if needed.   traMADol 50 MG tablet Commonly known as:  ULTRAM Take 50 mg by mouth every 8 (eight) hours as needed for moderate pain.   varenicline 1 MG tablet Commonly known as:  CHANTIX Take 1 tablet (1 mg total) by mouth 2 (two) times daily.   zolmitriptan 5 MG nasal solution Commonly known as:  ZOMIG Place 1 spray into the nose as needed for migraine.       Allergies:  Allergies  Allergen Reactions  . Clarithromycin Diarrhea, Rash and Other (See Comments)    Severe diarrhea  Rash Other reaction(s): DIARRHEA  . Hydrocodone-Homatropine Other (See Comments)    paranoia  . Hydrocodone-Homatropine Other (See Comments)    paranoia  . Penicillins Other (See Comments)    Unknown reaction, childhood allergy Has patient had a PCN reaction causing immediate rash, facial/tongue/throat swelling, SOB or lightheadedness with hypotension: Unknown Has patient had a PCN reaction causing severe rash involving mucus membranes or skin necrosis: Unknown Has patient had a PCN reaction that required hospitalization: Unknown Has patient had a PCN reaction occurring within the last 10 years: No If all of the above answers are "NO", then may proceed with Cephalosporin use.  unknown  . Topiramate Other (See Comments)    WEIGHT LOSS Other reaction(s): WEIGHT LOSS  . Codeine Itching    Past Medical History, Surgical history, Social history, and Family History were reviewed and updated.  Review of Systems: All other 10 point review of systems is negative.   Physical Exam:  vitals were not taken for this visit.   Wt Readings from Last 3 Encounters:  05/31/17 134 lb 9.6 oz (61.1 kg)  04/21/17 136 lb (61.7 kg)  03/27/17 136 lb 6 oz (61.9 kg)    Ocular: Sclera unicteric,  pupil round and reactive to light, s/p right eye enucleation Ear-nose-throat: Oropharynx clear, dentition fair Lymphatic: No cervical, supraclavicular or axillary adenopathy Lungs no rales or rhonchi, good excursion bilaterally Heart regular rate and rhythm, no murmur appreciated Abd soft, nontender, positive bowel sounds, no liver or spleen tip palpated on exam, no fluid wave  MSK no focal spinal tenderness, no joint edema Neuro: non-focal, well-oriented, appropriate affect Breasts: Deferred   Lab Results  Component Value Date   WBC 5.4 08/21/2017   HGB 12.8 08/21/2017   HCT 39.0 08/21/2017   MCV 101.0 08/21/2017   PLT 284 08/21/2017   No results found for: FERRITIN, IRON, TIBC, UIBC, IRONPCTSAT Lab Results  Component Value Date   RBC 3.86 08/21/2017   No results found for: KPAFRELGTCHN, LAMBDASER, KAPLAMBRATIO No results found for: IGGSERUM, IGA, IGMSERUM No results found for: TOTALPROTELP, ALBUMINELP, A1GS, A2GS, BETS, BETA2SER, GAMS, MSPIKE, SPEI   Chemistry      Component Value Date/Time   NA 139  08/21/2017 0825   NA 143 12/23/2016 0848   K 4.0 08/21/2017 0825   K 4.3 12/23/2016 0848   CL 101 08/21/2017 0825   CL 99 12/23/2016 0848   CO2 31 08/21/2017 0825   CO2 31 12/23/2016 0848   BUN 17 08/21/2017 0825   BUN 10 12/23/2016 0848   CREATININE 0.60 08/21/2017 0825   CREATININE 1.0 12/23/2016 0848      Component Value Date/Time   CALCIUM 9.4 08/21/2017 0825   CALCIUM 9.4 12/23/2016 0848   ALKPHOS 91 (H) 08/21/2017 0825   ALKPHOS 99 (H) 12/23/2016 0848   AST 21 08/21/2017 0825   ALT 22 08/21/2017 0825   ALT 26 12/23/2016 0848   BILITOT 0.4 08/21/2017 0825      Impression and Plan: Carol Quinn is a very pleasant 62 yo caucasian female with a choroidal melanoma of the right eye with right eye enucleation in October 2017. So far she has done well and CT scans today showed no evidence of metastatic disease of the chest, abdomen or pelvis.  She goes for her final  right eye prosthesis fitting next week.  We will plan to see her back in another 6 months for MD follow-up, repeat scans and lab.  She will contact our office with any questions or concerns. We can certainly see her sooner if need be.   Laverna Peace, NP 8/12/20199:32 AM

## 2017-10-03 ENCOUNTER — Encounter: Payer: Self-pay | Admitting: Family Medicine

## 2017-10-03 ENCOUNTER — Other Ambulatory Visit: Payer: Self-pay

## 2017-10-03 ENCOUNTER — Ambulatory Visit (INDEPENDENT_AMBULATORY_CARE_PROVIDER_SITE_OTHER): Payer: 59 | Admitting: Family Medicine

## 2017-10-03 VITALS — BP 128/84 | HR 80 | Temp 98.4°F | Resp 17 | Ht 62.0 in | Wt 145.4 lb

## 2017-10-03 DIAGNOSIS — R8290 Unspecified abnormal findings in urine: Secondary | ICD-10-CM | POA: Diagnosis not present

## 2017-10-03 DIAGNOSIS — E559 Vitamin D deficiency, unspecified: Secondary | ICD-10-CM | POA: Diagnosis not present

## 2017-10-03 DIAGNOSIS — I700 Atherosclerosis of aorta: Secondary | ICD-10-CM

## 2017-10-03 DIAGNOSIS — Z0000 Encounter for general adult medical examination without abnormal findings: Secondary | ICD-10-CM | POA: Diagnosis not present

## 2017-10-03 DIAGNOSIS — Z23 Encounter for immunization: Secondary | ICD-10-CM

## 2017-10-03 DIAGNOSIS — I10 Essential (primary) hypertension: Secondary | ICD-10-CM | POA: Diagnosis not present

## 2017-10-03 LAB — HEPATIC FUNCTION PANEL
ALT: 21 U/L (ref 0–35)
AST: 13 U/L (ref 0–37)
Albumin: 4.3 g/dL (ref 3.5–5.2)
Alkaline Phosphatase: 100 U/L (ref 39–117)
Bilirubin, Direct: 0.1 mg/dL (ref 0.0–0.3)
TOTAL PROTEIN: 6.6 g/dL (ref 6.0–8.3)
Total Bilirubin: 0.4 mg/dL (ref 0.2–1.2)

## 2017-10-03 LAB — LIPID PANEL
CHOLESTEROL: 186 mg/dL (ref 0–200)
HDL: 75.6 mg/dL (ref >39.00)
LDL Cholesterol: 93 mg/dL (ref 0–99)
NonHDL: 110.16
TRIGLYCERIDES: 87 mg/dL (ref 0.0–149.0)
Total CHOL/HDL Ratio: 2
VLDL: 17.4 mg/dL (ref 0.0–40.0)

## 2017-10-03 LAB — CBC WITH DIFFERENTIAL/PLATELET
BASOS ABS: 0.1 10*3/uL (ref 0.0–0.1)
BASOS PCT: 1.3 % (ref 0.0–3.0)
EOS ABS: 0.2 10*3/uL (ref 0.0–0.7)
Eosinophils Relative: 3.2 % (ref 0.0–5.0)
HEMATOCRIT: 41.9 % (ref 36.0–46.0)
Hemoglobin: 14.1 g/dL (ref 12.0–15.0)
LYMPHS PCT: 20.7 % (ref 12.0–46.0)
Lymphs Abs: 1.3 10*3/uL (ref 0.7–4.0)
MCHC: 33.6 g/dL (ref 30.0–36.0)
MCV: 98.3 fl (ref 78.0–100.0)
Monocytes Absolute: 0.4 10*3/uL (ref 0.1–1.0)
Monocytes Relative: 6.8 % (ref 3.0–12.0)
NEUTROS ABS: 4.3 10*3/uL (ref 1.4–7.7)
Neutrophils Relative %: 68 % (ref 43.0–77.0)
PLATELETS: 313 10*3/uL (ref 150.0–400.0)
RBC: 4.27 Mil/uL (ref 3.87–5.11)
RDW: 13.3 % (ref 11.5–15.5)
WBC: 6.3 10*3/uL (ref 4.0–10.5)

## 2017-10-03 LAB — BASIC METABOLIC PANEL
BUN: 14 mg/dL (ref 6–23)
CALCIUM: 9.5 mg/dL (ref 8.4–10.5)
CHLORIDE: 100 meq/L (ref 96–112)
CO2: 33 meq/L — AB (ref 19–32)
CREATININE: 0.56 mg/dL (ref 0.40–1.20)
GFR: 117.07 mL/min (ref >60.00)
GLUCOSE: 93 mg/dL (ref 70–99)
Potassium: 4.2 mEq/L (ref 3.5–5.1)
Sodium: 141 mEq/L (ref 135–145)

## 2017-10-03 LAB — POCT URINALYSIS DIPSTICK
BILIRUBIN UA: NEGATIVE
Glucose, UA: NEGATIVE
Ketones, UA: NEGATIVE
LEUKOCYTES UA: NEGATIVE
Nitrite, UA: NEGATIVE
Protein, UA: NEGATIVE
RBC UA: NEGATIVE
Spec Grav, UA: 1.015 (ref 1.010–1.025)
UROBILINOGEN UA: 0.2 U/dL
pH, UA: 8 (ref 5.0–8.0)

## 2017-10-03 LAB — TSH: TSH: 2.56 u[IU]/mL (ref 0.35–4.50)

## 2017-10-03 LAB — VITAMIN D 25 HYDROXY (VIT D DEFICIENCY, FRACTURES): VITD: 28.2 ng/mL — AB (ref 30.00–100.00)

## 2017-10-03 NOTE — Patient Instructions (Signed)
Follow up in 6 months to recheck the BP and cholesterol We'll notify you of your lab results and make any changes if needed Continue to work on healthy diet and regular exercise- you can do it! GO GET YOUR MAMMOGRAM!!! Call with any questions or concerns Happy Fall!!! GOOD LUCK WITH SURGERY!!!

## 2017-10-03 NOTE — Progress Notes (Signed)
   Subjective:    Patient ID: Carol Quinn, female    DOB: Oct 07, 1955, 62 y.o.   MRN: [id number]  HPI CPE- UTD on colonoscopy.  No need for pap due to hysterectomy.  Due for mammo (pt plans to schedule).  UTD on PNA vaccines, Tdap.  Will get flu today.   Review of Systems Patient reports no hearing changes, adenopathy,fever, weight change,  persistant/recurrent hoarseness , swallowing issues, palpitations, edema, hemoptysis, gastrointestinal bleeding (melena, rectal bleeding), abdominal pain, significant heartburn, bowel changes, Gyn symptoms (abnormal  bleeding, pain),  syncope, focal weakness, memory loss, skin/hair/nail changes, abnormal bruising or bleeding, anxiety, or depression.  + chronic cough  + chronic SOB + chest pain- pt reports this is due to chronic cough and COPD + abnormal urine odor + numbness of hands    Objective:   Physical Exam General Appearance:    Alert, cooperative, no distress, appears stated age  Head:    Normocephalic, without obvious abnormality, atraumatic  Eyes:    R prosthesis in place, L ptosis of upper lid  Ears:    Normal TM's and external ear canals, both ears  Nose:   Nares normal, septum midline, mucosa normal, no drainage    or sinus tenderness  Throat:   Lips, mucosa, and tongue normal; teeth and gums normal  Neck:   Supple, symmetrical, trachea midline, no adenopathy;    Thyroid: no enlargement/tenderness/nodules  Back:     Symmetric, no curvature, ROM normal, no CVA tenderness  Lungs:     Clear to auscultation bilaterally, respirations unlabored  Chest Wall:    No tenderness or deformity   Heart:    Regular rate and rhythm, S1 and S2 normal, no murmur, rub   or gallop  Breast Exam:    Deferred to GYN  Abdomen:     Soft, non-tender, bowel sounds active all four quadrants,    no masses, no organomegaly  Genitalia:    Deferred to GYN  Rectal:    Extremities:   Extremities normal, atraumatic, no cyanosis or edema  Pulses:   2+ and  symmetric all extremities  Skin:   Skin color, texture, turgor normal, no rashes or lesions  Lymph nodes:   Cervical, supraclavicular, and axillary nodes normal  Neurologic:   CNII-XII intact, normal strength, sensation and reflexes    throughout          Assessment & Plan:

## 2017-10-03 NOTE — Assessment & Plan Note (Signed)
On statin.  Goal is lipid and BP control.  Not on ASA due to multiple eye surgeries

## 2017-10-03 NOTE — Assessment & Plan Note (Signed)
Pt has hx of this.  Check labs and replete prn. 

## 2017-10-03 NOTE — Assessment & Plan Note (Signed)
Chronic problem.  Adequate control.  Check labs.  No anticipated med changes.  Will follow.

## 2017-10-03 NOTE — Assessment & Plan Note (Signed)
Pt's PE unchanged from previous.  UTD on colonoscopy.  Plans to schedule mammo.  Flu shot given today.  Check labs.  Anticipatory guidance provided.

## 2017-10-04 ENCOUNTER — Encounter: Payer: Self-pay | Admitting: General Practice

## 2017-10-30 ENCOUNTER — Ambulatory Visit: Payer: 59 | Admitting: Internal Medicine

## 2017-10-30 ENCOUNTER — Encounter: Payer: Self-pay | Admitting: Internal Medicine

## 2017-10-30 VITALS — BP 124/76 | HR 73 | Ht 62.0 in | Wt 148.0 lb

## 2017-10-30 DIAGNOSIS — Z720 Tobacco use: Secondary | ICD-10-CM

## 2017-10-30 DIAGNOSIS — Z23 Encounter for immunization: Secondary | ICD-10-CM

## 2017-10-30 DIAGNOSIS — J9611 Chronic respiratory failure with hypoxia: Secondary | ICD-10-CM | POA: Diagnosis not present

## 2017-10-30 DIAGNOSIS — C6931 Malignant neoplasm of right choroid: Secondary | ICD-10-CM | POA: Diagnosis not present

## 2017-10-30 DIAGNOSIS — J441 Chronic obstructive pulmonary disease with (acute) exacerbation: Secondary | ICD-10-CM | POA: Diagnosis not present

## 2017-10-30 MED ORDER — ALBUTEROL SULFATE (2.5 MG/3ML) 0.083% IN NEBU: 2.5000 mg | INHALATION_SOLUTION | Freq: Four times a day (QID) | RESPIRATORY_TRACT | 11 refills | Status: DC | PRN

## 2017-10-30 MED ORDER — DOXYCYCLINE HYCLATE 100 MG PO TABS: 100.0000 mg | ORAL_TABLET | Freq: Two times a day (BID) | ORAL | 3 refills | Status: DC

## 2017-10-30 NOTE — Assessment & Plan Note (Signed)
She says they are watching with concern a "freckle" in her left eye now.

## 2017-10-30 NOTE — Assessment & Plan Note (Signed)
Continues oxygen at night and as needed.  Cannula has left pressure marks on her cheeks, visible this morning.

## 2017-10-30 NOTE — Progress Notes (Signed)
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 Pt has not smoked since 05-27-17.  Modafinil, Bevespi, albuterol hfa, singulair,  Has not smoked in the past week-strongly encouraged to stick with it.  Discussed Chantix. Has had  to go for eye surgeries revising after enucleation for melanoma. Some increased shortness of breath about a month ago.  Cough mostly dry throat clearing.  Some wheeze.  Using rescue inhaler several times daily with Bevespi twice daily.  Needs refill nebulizer medication CT chest/12/19- IMPRESSION: 1. No acute process or evidence of metastatic disease within the chest, abdomen, or pelvis. 2. No explanation for elevated liver function tests. 3. Age advanced coronary artery atherosclerosis. Recommend assessment of coronary risk factors and consideration of medical therapy. 4. Aortic atherosclerosis (ICD10-I70.0) and emphysema (ICD10-J43.9).  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 Pt notes increase in cough in past few days as well-slight yellow in color.  Singulair, Trelegy, albuterol HFA, neb albuterol, " They are watching a freckle in my left eye now-I am going to [facility]".  She is pending more cosmetic revision surgery right eyelid. Awareness of dyspnea varies, particularly associated with exertion.  Increased cough in the last 4 days with sputum beginning to turn yellow.  Not a definite bronchitis exacerbation yet but she would like to keep antibiotic on hand-discussed.  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  Trelegy often seems to wear out by mid afternoon and we discussed use of nebulizer machine to make up the difference. CT chest 08/21/2017 IMPRESSION: 1. No evidence of metastatic disease. 2. Aortic atherosclerosis (ICD10-170.0). Coronary artery calcification. 3.  Emphysema (ICD10-J43.9). 4. Left adrenal adenoma.  ROS-see HPI   + = positive Constitutional:   No-   weight loss, night sweats, fevers, chills, +fatigue, lassitude. HEENT:   No-  headaches, difficulty swallowing, tooth/dental problems, sore throat,       No-  sneezing, itching, ear ache, +nasal congestion, +post nasal drip,  CV:  No-   chest pain, orthopnea, PND, swelling in lower extremities, anasarca, dizziness, palpitations Resp: + shortness of breath with exertion or at rest.                +productive cough,  + non-productive cough,  No- coughing up of blood.               +change in color of mucus.  + wheezing.   Skin: No-   rash or lesions. GI:  No-   heartburn, indigestion, abdominal pain, nausea, vomiting,  GU:  MS:  No-   joint pain or swelling.   Neuro-     nothing unusual Psych:  No- change in mood or affect. No depression or anxiety.  No memory loss.  OBJ- Physical Exam   General- Alert, Oriented, Affect-appropriate,    Skin- clear  Lymphadenopathy- none Head- atraumatic            Eyes- + R prosthesis-            Ears- Hearing, canals-normal            Nose- clear, no-Septal dev, mucus, polyps, erosion, perforation             Throat- Mallampati IV , mucosa clear/ not red , drainage- none, tonsils- small residual Neck- flexible , trachea midline, no stridor , thyroid nl, carotid no bruit Chest - symmetrical excursion , unlabored           Heart/CV- RRR , no murmur , no gallop  , no rub, nl s1 s2                           - JVD- none , edema- none, stasis changes- none, varices- none           Lung- + wheeze, cough-none , dullness-none, rub- none,                                + distant breath sounds           Chest wall-  Abd-  Br/ Gen/ Rectal- Not done, not indicated Extrem- cyanosis- none, clubbing, none, atrophy- none, strength- nl Neuro- grossly intact to observation

## 2017-10-30 NOTE — Assessment & Plan Note (Signed)
Mild bronchitic exacerbation.  We discussed management and fall season with rainy weather etc.  Trelegy not lasting 24 hours. Plan-continue Trelegy but use nebulizer machine once or twice daily as needed to supplement.  She is given doxycycline to hold with discussion.

## 2017-10-30 NOTE — Assessment & Plan Note (Signed)
She has not smoked since May, 2019 and is congratulated.  Still uses Chantix.

## 2017-10-30 NOTE — Patient Instructions (Addendum)
Consider using your nebulizer once or twice daily. See if using it at least in the afternoon or evening helps. Let me know if you still feel meds aren't lasting.  Script for doxycycline sent  Script refilling neb medicine sent  Order- Pneumovax-23  Please call if we can help

## 2017-11-13 ENCOUNTER — Other Ambulatory Visit: Payer: Self-pay | Admitting: Internal Medicine

## 2017-11-15 ENCOUNTER — Other Ambulatory Visit (HOSPITAL_BASED_OUTPATIENT_CLINIC_OR_DEPARTMENT_OTHER): Payer: Self-pay | Admitting: Obstetrics and Gynecology

## 2017-11-15 DIAGNOSIS — Z1231 Encounter for screening mammogram for malignant neoplasm of breast: Secondary | ICD-10-CM

## 2017-11-17 ENCOUNTER — Ambulatory Visit (HOSPITAL_BASED_OUTPATIENT_CLINIC_OR_DEPARTMENT_OTHER)
Admission: RE | Admit: 2017-11-17 | Discharge: 2017-11-17 | Disposition: A | Discharge status: Home / Self Care | Payer: 59 | Source: Ambulatory Visit | Attending: Obstetrics and Gynecology | Admitting: Obstetrics and Gynecology

## 2017-11-17 DIAGNOSIS — Z1231 Encounter for screening mammogram for malignant neoplasm of breast: Secondary | ICD-10-CM | POA: Insufficient documentation

## 2017-11-24 ENCOUNTER — Encounter: Payer: Self-pay | Admitting: Family Medicine

## 2017-11-24 NOTE — Telephone Encounter (Signed)
Please advise 

## 2017-12-13 ENCOUNTER — Other Ambulatory Visit: Payer: Self-pay | Admitting: General Practice

## 2017-12-13 MED ORDER — VALSARTAN-HYDROCHLOROTHIAZIDE 160-25 MG PO TABS: 1.0000 | ORAL_TABLET | Freq: Every day | ORAL | 3 refills | Status: DC

## 2017-12-18 LAB — HM PAP SMEAR

## 2017-12-19 ENCOUNTER — Encounter: Payer: Self-pay | Admitting: General Practice

## 2018-01-09 ENCOUNTER — Other Ambulatory Visit: Payer: Self-pay | Admitting: Family Medicine

## 2018-01-17 ENCOUNTER — Other Ambulatory Visit (INDEPENDENT_AMBULATORY_CARE_PROVIDER_SITE_OTHER): Payer: Medicare Other

## 2018-01-17 ENCOUNTER — Ambulatory Visit (INDEPENDENT_AMBULATORY_CARE_PROVIDER_SITE_OTHER): Payer: Medicare Other | Admitting: Nurse Practitioner

## 2018-01-17 ENCOUNTER — Encounter: Payer: Self-pay | Admitting: Nurse Practitioner

## 2018-01-17 VITALS — BP 124/60 | HR 73 | Wt 151.0 lb

## 2018-01-17 DIAGNOSIS — K219 Gastro-esophageal reflux disease without esophagitis: Secondary | ICD-10-CM

## 2018-01-17 DIAGNOSIS — R1011 Right upper quadrant pain: Secondary | ICD-10-CM

## 2018-01-17 DIAGNOSIS — R140 Abdominal distension (gaseous): Secondary | ICD-10-CM

## 2018-01-17 LAB — COMPREHENSIVE METABOLIC PANEL
ALT: 19 U/L (ref 0–35)
AST: 16 U/L (ref 0–37)
Albumin: 4.3 g/dL (ref 3.5–5.2)
Alkaline Phosphatase: 100 U/L (ref 39–117)
BUN: 14 mg/dL (ref 6–23)
CO2: 28 mEq/L (ref 19–32)
Calcium: 9.6 mg/dL (ref 8.4–10.5)
Chloride: 101 mEq/L (ref 96–112)
Creatinine, Ser: 0.73 mg/dL (ref 0.40–1.20)
GFR: 86.13 mL/min (ref >60.00)
Glucose, Bld: 103 mg/dL — ABNORMAL HIGH (ref 70–99)
POTASSIUM: 4.2 meq/L (ref 3.5–5.1)
SODIUM: 140 meq/L (ref 135–145)
Total Bilirubin: 0.4 mg/dL (ref 0.2–1.2)
Total Protein: 6.9 g/dL (ref 6.0–8.3)

## 2018-01-17 LAB — CBC WITH DIFFERENTIAL/PLATELET
Basophils Absolute: 0 10*3/uL (ref 0.0–0.1)
Basophils Relative: 0.2 % (ref 0.0–3.0)
Eosinophils Absolute: 0.2 10*3/uL (ref 0.0–0.7)
Eosinophils Relative: 3.5 % (ref 0.0–5.0)
HCT: 43.4 % (ref 36.0–46.0)
Hemoglobin: 14.7 g/dL (ref 12.0–15.0)
Lymphocytes Relative: 23.5 % (ref 12.0–46.0)
Lymphs Abs: 1.7 10*3/uL (ref 0.7–4.0)
MCHC: 33.9 g/dL (ref 30.0–36.0)
MCV: 96.4 fl (ref 78.0–100.0)
Monocytes Absolute: 0.8 10*3/uL (ref 0.1–1.0)
Monocytes Relative: 11.2 % (ref 3.0–12.0)
Neutro Abs: 4.4 10*3/uL (ref 1.4–7.7)
Neutrophils Relative %: 61.6 % (ref 43.0–77.0)
Platelets: 353 10*3/uL (ref 150.0–400.0)
RBC: 4.51 Mil/uL (ref 3.87–5.11)
RDW: 13 % (ref 11.5–15.5)
WBC: 7.1 10*3/uL (ref 4.0–10.5)

## 2018-01-17 LAB — LIPASE: Lipase: 2 U/L — ABNORMAL LOW (ref 11.0–59.0)

## 2018-01-17 NOTE — Progress Notes (Addendum)
Chief Complaint:    RUQ pain  IMPRESSION and PLAN:    84.  63 year old female with nearly 2-week history of RUQ pain unrelated to eating. She has associated nausea and complains of abdominal distention as well.  -We will obtain liver test, CBC and lipase -Arrange for abdominal ultrasound -We will call her with test results to get a condition update in a few days.    2.  GERD.  Having breakthrough regurgitation n daily PPI over the last couple weeks (since onset of above symptoms.  Has gained some weight since cessation of smoking 6 months ago.  This may be contributing to the breakthrough symptoms.  -Continue daily PPI for now.  Continue to sort through above symptoms.   3. Ocular melanoma. For follow-up CT scan (Dr. Marin Olp) in Feb.     Addendum: Reviewed and agree with assessment and management plan. Jerene Bears, MD   HPI:     Patient is a 63yo female with hx of choroidal melanoma, sp enucleation 2017. She is followed by Dr.  Marin Olp.  Has medical history also pertinent for COPD on nocturnal oxygen, hypertension, and GERD.  Is known to Dr. West Carbo from screening colonoscopy at which time 2 small polyps were removed. One polyp was a serrated adenoma without cytologic dysplasia, the other was a tubular adenoma without HGD.   Carol Quinn is here for evaluation of RUQ pain which awoke her up from sleep on Sunday 12/28.  Whether or not the pain radiated through to the back, patient is not clear room.  She does have some right mid back pain but did not seem to correlate the two until I mentioned it today.  The pain eventually eased off, the next day she was sore but had no significant pain.  A few days later, pain recurred.  This time the pain was radiating downward towards the RLQ.  Pain was not related to eating.  Bowel movements were at baseline.  Patient does recall trying to take a deep breath and having a pulling sensation across her upper abdomen.  With the recurrence of pain patient  got ready to go to the emergency department but held off as the pain subsided.  Since that time she is continued to have almost constant RUQ discomfort.  Laying on her right side actually helps the pain.  Patient complains of abdominal distention/bloating sensation.  She has been nauseated but no vomiting.  No NSAID use. She also has a history of GERD and having breakthrough symptoms since the onset of this RUQ pain.  She is on a daily PPI she has gained weight after cessation of tobacco 6 months ago.  Per EMR her weight is up 10 pounds since late May   Review of systems:     No chest pain, no SOB, no fevers, no urinary sx   Past Medical History:  Diagnosis Date  . Acute bronchitis   . Allergic rhinitis   . Aortic atherosclerosis ([place])   . Cancer ([place])   . Childhood asthma   . Chronic headaches   . Complication of anesthesia    woke up during surgery x 1  . Complication of anesthesia    hard to wake up x 1  . COPD (chronic obstructive pulmonary disease) ([facility])    emphysema  . Emphysema, unspecified ([place])   . Exposure to TB    uncle-her PPD neg  . GERD (gastroesophageal reflux disease)   . Hepatic cyst  left  . Hypertension   . MDS (myelodysplastic syndrome), low grade ([place]) 09/23/2016  . Ocular melanoma, right ([place])   . RBBB (right bundle branch block with left anterior fascicular block)     Patient's surgical history, family medical history, social history, medications and allergies were all reviewed in Epic   Creatinine clearance cannot be calculated (Patient's most recent lab result is older than the maximum 21 days allowed.)  Current Outpatient Medications  Medication Sig Dispense Refill  . albuterol (PROVENTIL HFA;VENTOLIN HFA) 108 (90 Base) MCG/ACT inhaler Inhale 2 puffs into the lungs every 6 (six) hours as needed for wheezing or shortness of breath. 1 Inhaler 12  . albuterol (PROVENTIL) (2.5 MG/3ML) 0.083% nebulizer solution Take 3 mLs (2.5 mg total) by nebulization  every 6 (six) hours as needed for wheezing or shortness of breath. 150 mL 11  . atorvastatin (LIPITOR) 20 MG tablet TAKE 1 TABLET BY MOUTH ONCE DAILY 30 tablet 6  . buPROPion (WELLBUTRIN XL) 150 MG 24 hr tablet TAKE 150 MG BY MOUTH DAILY 30 tablet 6  . CHANTIX CONTINUING MONTH PAK 1 MG tablet TAKE 1 TABLET BY MOUTH TWICE DAILY 60 tablet 1  . doxycycline (VIBRA-TABS) 100 MG tablet Take 1 tablet (100 mg total) by mouth 2 (two) times daily. 14 tablet 3  . escitalopram (LEXAPRO) 20 MG tablet TAKE 1 TABLET BY MOUTH DAILY 30 tablet 6  . fexofenadine (ALLEGRA) 180 MG tablet Take 180 mg by mouth daily.    . Fluticasone-Umeclidin-Vilant (TRELEGY ELLIPTA) 100-62.5-25 MCG/INH AEPB Inhale 1 puff into the lungs daily. 60 each 11  . modafinil (PROVIGIL) 200 MG tablet TAKE 1 TABLET BY MOUTH EVERY DAY 30 tablet 2  . montelukast (SINGULAIR) 10 MG tablet TAKE 1 TABLET BY MOUTH AT BEDTIME 30 tablet prn  . omeprazole (PRILOSEC) 40 MG capsule TAKE 1 CAPSULE BY MOUTH DAILY 30 capsule 6  . potassium chloride SA (K-DUR,KLOR-CON) 20 MEQ tablet TAKE 1 TABLET BY MOUTH DAILY (Patient taking differently: TAKE 20 MEQ BY MOUTH DAILY) 30 tablet 6  . PREVIDENT 5000 PLUS 1.1 % CREA dental cream Place 1 application onto teeth at bedtime.     Marland Kitchen Respiratory Therapy Supplies (FLUTTER) DEVI Use as directed 1 each 0  . SUMAtriptan (IMITREX) 100 MG tablet Take one tablet at onset of migraine, repeat in 2 hours if needed. (Patient taking differently: Take 100 mg by mouth every 2 (two) hours as needed for migraine or headache. ) 10 tablet 6  . traMADol (ULTRAM) 50 MG tablet Take 50 mg by mouth every 8 (eight) hours as needed for moderate pain.    . valsartan-hydrochlorothiazide (DIOVAN-HCT) 160-25 MG tablet Take 1 tablet by mouth daily. 30 tablet 3  . zolmitriptan (ZOMIG) 5 MG nasal solution Place 1 spray into the nose as needed for migraine.      No current facility-administered medications for this visit.     Physical Exam:     BP  124/60   Pulse 73   Wt 151 lb (68.5 kg)   SpO2 98%   BMI 27.62 kg/m   GENERAL:  Pleasant female in NAD PSYCH: : Cooperative, normal affect EENT:  conjunctiva pink, mucous membranes moist, neck supple without masses CARDIAC:  RRR, no murmur heard, no peripheral edema PULM: Normal respiratory effort, lungs CTA bilaterally, no wheezing ABDOMEN:  Nondistended, soft, mildly distended. No obvious masses, no hepatomegaly,  normal bowel sounds SKIN:  turgor, no lesions seen Musculoskeletal:  Normal muscle tone, normal strength NEURO: Alert and  oriented x 3, no focal neurologic deficits   Tye Savoy , NP 01/17/2018, 9:20 AM

## 2018-01-17 NOTE — Patient Instructions (Signed)
If you are age 62 or older, your body mass index should be between 23-30. Your Body mass index is 27.62 kg/m. If this is out of the aforementioned range listed, please consider follow up with your Primary Care Provider.  If you are age 30 or younger, your body mass index should be between 19-25. Your Body mass index is 27.62 kg/m. If this is out of the aformentioned range listed, please consider follow up with your Primary Care Provider.   Your provider has requested that you go to the basement level for lab work before leaving today. Press "B" on the elevator. The lab is located at the first door on the left as you exit the elevator.  You have been scheduled for an abdominal ultrasound at [facility] (1st floor of hospital) on 01/22/18 at 8 am. Please arrive 15 minutes prior to your appointment for registration. Make certain not to have anything to eat or drink 6 hours prior to your appointment. Should you need to reschedule your appointment, please contact radiology at [phone]. This test typically takes about 30 minutes to perform.  We will call you with results.  Thank you for choosing me and [facility].   Tye Savoy, NP

## 2018-01-18 ENCOUNTER — Telehealth: Payer: Self-pay

## 2018-01-18 NOTE — Telephone Encounter (Signed)
I called patient and reviewed lab results.  She had a question regarding low lipase level.  Explained that in the setting of her acute RIGHT abdominal pain the low lipase was not concerning. Chronic pancreatitis may cause low lipase level but there are no indications that she has any problems with pancreas, acute of chronic. Additionally she had a CTAP scan in Aug of this year and pancreas was unremarkable.   She continues to have righted sided abdominal pain. Scheduled for U/S, will call with results

## 2018-01-18 NOTE — Telephone Encounter (Signed)
Noted. Thank you for calling the patient.

## 2018-01-18 NOTE — Telephone Encounter (Signed)
-----   Message from Willia Craze, NP sent at 01/18/2018  9:30 AM EST ----- Carol Quinn, please let patient know that the labs look okay, we will wait to see what the ultrasound shows.  Thanks

## 2018-01-18 NOTE — Telephone Encounter (Signed)
Patient has seen her lipase result. She asks what is the significance, is it detrimental, is there a connection to her symptoms and what follow up tests does she need?

## 2018-01-22 ENCOUNTER — Ambulatory Visit (HOSPITAL_COMMUNITY)
Admission: RE | Admit: 2018-01-22 | Discharge: 2018-01-22 | Disposition: A | Discharge status: Home / Self Care | Payer: Medicare Other | Source: Ambulatory Visit | Attending: Nurse Practitioner | Admitting: Nurse Practitioner

## 2018-01-22 DIAGNOSIS — R140 Abdominal distension (gaseous): Secondary | ICD-10-CM | POA: Insufficient documentation

## 2018-01-22 DIAGNOSIS — R1011 Right upper quadrant pain: Secondary | ICD-10-CM | POA: Insufficient documentation

## 2018-01-22 DIAGNOSIS — K219 Gastro-esophageal reflux disease without esophagitis: Secondary | ICD-10-CM | POA: Insufficient documentation

## 2018-02-07 ENCOUNTER — Other Ambulatory Visit: Payer: Self-pay | Admitting: Family Medicine

## 2018-02-19 DIAGNOSIS — M65311 Trigger thumb, right thumb: Secondary | ICD-10-CM | POA: Insufficient documentation

## 2018-02-21 ENCOUNTER — Inpatient Hospital Stay: Payer: Medicare Other | Attending: Hematology & Oncology | Admitting: Hematology & Oncology

## 2018-02-21 ENCOUNTER — Encounter (HOSPITAL_BASED_OUTPATIENT_CLINIC_OR_DEPARTMENT_OTHER): Payer: Self-pay

## 2018-02-21 ENCOUNTER — Ambulatory Visit (HOSPITAL_BASED_OUTPATIENT_CLINIC_OR_DEPARTMENT_OTHER)
Admission: RE | Admit: 2018-02-21 | Discharge: 2018-02-21 | Disposition: A | Discharge status: Home / Self Care | Payer: Medicare Other | Source: Ambulatory Visit | Attending: Family | Admitting: Family

## 2018-02-21 ENCOUNTER — Inpatient Hospital Stay: Payer: Medicare Other

## 2018-02-21 VITALS — BP 128/65 | HR 70 | Temp 97.7°F | Resp 18 | Wt 154.8 lb

## 2018-02-21 DIAGNOSIS — Z85840 Personal history of malignant neoplasm of eye: Secondary | ICD-10-CM | POA: Diagnosis not present

## 2018-02-21 DIAGNOSIS — R109 Unspecified abdominal pain: Secondary | ICD-10-CM | POA: Diagnosis not present

## 2018-02-21 DIAGNOSIS — J449 Chronic obstructive pulmonary disease, unspecified: Secondary | ICD-10-CM | POA: Diagnosis not present

## 2018-02-21 DIAGNOSIS — J42 Unspecified chronic bronchitis: Secondary | ICD-10-CM

## 2018-02-21 DIAGNOSIS — C6931 Malignant neoplasm of right choroid: Secondary | ICD-10-CM | POA: Diagnosis not present

## 2018-02-21 DIAGNOSIS — Z79899 Other long term (current) drug therapy: Secondary | ICD-10-CM | POA: Diagnosis not present

## 2018-02-21 LAB — CBC WITH DIFFERENTIAL (CANCER CENTER ONLY)
Abs Immature Granulocytes: 0.01 10*3/uL (ref 0.00–0.07)
Basophils Absolute: 0.1 10*3/uL (ref 0.0–0.1)
Basophils Relative: 2 %
Eosinophils Absolute: 0.3 10*3/uL (ref 0.0–0.5)
Eosinophils Relative: 3 %
HCT: 42.5 % (ref 36.0–46.0)
Hemoglobin: 14 g/dL (ref 12.0–15.0)
IMMATURE GRANULOCYTES: 0 %
Lymphocytes Relative: 32 %
Lymphs Abs: 2.3 10*3/uL (ref 0.7–4.0)
MCH: 32.6 pg (ref 26.0–34.0)
MCHC: 32.9 g/dL (ref 30.0–36.0)
MCV: 98.8 fL (ref 80.0–100.0)
MONOS PCT: 10 %
Monocytes Absolute: 0.7 10*3/uL (ref 0.1–1.0)
NEUTROS PCT: 53 %
Neutro Abs: 3.9 10*3/uL (ref 1.7–7.7)
Platelet Count: 314 10*3/uL (ref 150–400)
RBC: 4.3 MIL/uL (ref 3.87–5.11)
RDW: 12.2 % (ref 11.5–15.5)
WBC Count: 7.3 10*3/uL (ref 4.0–10.5)
nRBC: 0 % (ref 0.0–0.2)

## 2018-02-21 LAB — CMP (CANCER CENTER ONLY)
ALT: 21 U/L (ref 0–44)
AST: 18 U/L (ref 15–41)
Albumin: 4.5 g/dL (ref 3.5–5.0)
Alkaline Phosphatase: 87 U/L (ref 38–126)
Anion gap: 8 (ref 5–15)
BUN: 17 mg/dL (ref 8–23)
CO2: 31 mmol/L (ref 22–32)
Calcium: 9.6 mg/dL (ref 8.9–10.3)
Chloride: 101 mmol/L (ref 98–111)
Creatinine: 0.71 mg/dL (ref 0.44–1.00)
GFR, Est AFR Am: >60 mL/min (ref >60)
GFR, Estimated: >60 mL/min (ref >60)
Glucose, Bld: 93 mg/dL (ref 70–99)
Potassium: 3.7 mmol/L (ref 3.5–5.1)
Sodium: 140 mmol/L (ref 135–145)
Total Bilirubin: 0.3 mg/dL (ref 0.3–1.2)
Total Protein: 6.5 g/dL (ref 6.5–8.1)

## 2018-02-21 LAB — LACTATE DEHYDROGENASE: LDH: 161 U/L (ref 98–192)

## 2018-02-21 MED ORDER — IOPAMIDOL (ISOVUE-300) INJECTION 61%
100.0000 mL | Freq: Once | INTRAVENOUS | Status: AC | PRN
  Administered 2018-02-21: 100 mL via INTRAVENOUS

## 2018-02-21 NOTE — Progress Notes (Signed)
Hematology and Oncology Follow Up Visit  Carol Quinn [id number] Nov 14, 1955 63 y.o. 02/21/2018   Principle Diagnosis:  Choroidal melanoma of the right eye, status post right eye enucleation COPD-chronic bronchitis  Current Therapy:   Observation   Interim History:  Carol Quinn is here today for follow-up.  So far, she has been doing pretty well.  She did have some right-sided abdominal pain a few weeks ago.  This is came on all of a sudden.  She did go see her gastroenterologist.  It did not appear that anything need to be done.  Some blood work was done.  She was worried that a lipase level was low.  She got on the Internet and read that a low lipase level indicates pancreatic cancer.  She had her CAT scans done today for the choroidal melanoma.  The CAT scans do not show any evidence of recurrent disease.  Her pancreas looked fine.  It almost sounds like she may have had appendicitis.  I told her this pain happens again, that she has to go to the emergency room to have a CAT scan done immediately.  Otherwise, she seems to be doing pretty well.  There is no issue with headaches..  She has had no problems with her right eye.  She has had multiple surgeries to try to make everything look anatomically correct.  Overall, her performance status is ECOG 1.   Medications:  Allergies as of 02/21/2018      Reactions   Clarithromycin Diarrhea, Rash, Other (See Comments)   Severe diarrhea  Rash Other reaction(s): DIARRHEA   Hydrocodone-homatropine Other (See Comments)   paranoia   Hydrocodone-homatropine Other (See Comments)   paranoia   Penicillins Other (See Comments)   Unknown reaction, childhood allergy Has patient had a PCN reaction causing immediate rash, facial/tongue/throat swelling, SOB or lightheadedness with hypotension: Unknown Has patient had a PCN reaction causing severe rash involving mucus membranes or skin necrosis: Unknown Has patient had a PCN reaction that required  hospitalization: Unknown Has patient had a PCN reaction occurring within the last 10 years: No If all of the above answers are "NO", then may proceed with Cephalosporin use. unknown Unknown reaction, childhood allergy   Topiramate Other (See Comments)   WEIGHT LOSS Other reaction(s): WEIGHT LOSS WEIGHT LOSS   Codeine Itching      Medication List       Accurate as of February 21, 2018 10:18 AM. Always use your most recent med list.        albuterol 108 (90 Base) MCG/ACT inhaler Commonly known as:  PROVENTIL HFA;VENTOLIN HFA Inhale 2 puffs into the lungs every 6 (six) hours as needed for wheezing or shortness of breath.   albuterol (2.5 MG/3ML) 0.083% nebulizer solution Commonly known as:  PROVENTIL Take 3 mLs (2.5 mg total) by nebulization every 6 (six) hours as needed for wheezing or shortness of breath.   atorvastatin 20 MG tablet Commonly known as:  LIPITOR TAKE 1 TABLET BY MOUTH ONCE DAILY   buPROPion 150 MG 24 hr tablet Commonly known as:  WELLBUTRIN XL TAKE 150 MG BY MOUTH DAILY   CHANTIX CONTINUING MONTH PAK 1 MG tablet Generic drug:  varenicline TAKE 1 TABLET BY MOUTH TWICE DAILY   doxycycline 100 MG tablet Commonly known as:  VIBRA-TABS Take 1 tablet (100 mg total) by mouth 2 (two) times daily.   escitalopram 20 MG tablet Commonly known as:  LEXAPRO TAKE 1 TABLET BY MOUTH DAILY   fexofenadine 180  MG tablet Commonly known as:  ALLEGRA Take 180 mg by mouth daily.   Fluticasone-Umeclidin-Vilant 100-62.5-25 MCG/INH Aepb Commonly known as:  TRELEGY ELLIPTA Inhale 1 puff into the lungs daily.   FLUTTER Devi Use as directed   modafinil 200 MG tablet Commonly known as:  PROVIGIL TAKE 1 TABLET BY MOUTH EVERY DAY   montelukast 10 MG tablet Commonly known as:  SINGULAIR TAKE 1 TABLET BY MOUTH AT BEDTIME   omeprazole 40 MG capsule Commonly known as:  PRILOSEC TAKE 1 CAPSULE BY MOUTH ONCE DAILY   potassium chloride SA 20 MEQ tablet Commonly known as:   K-DUR,KLOR-CON TAKE 1 TABLET BY MOUTH DAILY   PREVIDENT 5000 PLUS 1.1 % Crea dental cream Generic drug:  sodium fluoride Place 1 application onto teeth at bedtime.   SUMAtriptan 100 MG tablet Commonly known as:  IMITREX Take one tablet at onset of migraine, repeat in 2 hours if needed.   traMADol 50 MG tablet Commonly known as:  ULTRAM Take 50 mg by mouth every 8 (eight) hours as needed for moderate pain.   valsartan-hydrochlorothiazide 160-25 MG tablet Commonly known as:  DIOVAN-HCT Take 1 tablet by mouth daily.   zolmitriptan 5 MG nasal solution Commonly known as:  ZOMIG Place 1 spray into the nose as needed for migraine.       Allergies:  Allergies  Allergen Reactions  . Clarithromycin Diarrhea, Rash and Other (See Comments)    Severe diarrhea  Rash Other reaction(s): DIARRHEA  . Hydrocodone-Homatropine Other (See Comments)    paranoia  . Hydrocodone-Homatropine Other (See Comments)    paranoia  . Penicillins Other (See Comments)    Unknown reaction, childhood allergy Has patient had a PCN reaction causing immediate rash, facial/tongue/throat swelling, SOB or lightheadedness with hypotension: Unknown Has patient had a PCN reaction causing severe rash involving mucus membranes or skin necrosis: Unknown Has patient had a PCN reaction that required hospitalization: Unknown Has patient had a PCN reaction occurring within the last 10 years: No If all of the above answers are "NO", then may proceed with Cephalosporin use.  unknown Unknown reaction, childhood allergy  . Topiramate Other (See Comments)    WEIGHT LOSS Other reaction(s): WEIGHT LOSS WEIGHT LOSS  . Codeine Itching    Past Medical History, Surgical history, Social history, and Family History were reviewed and updated.  Review of Systems: Review of Systems  Constitutional: Negative.   HENT: Negative.   Eyes: Negative.   Respiratory: Negative.   Cardiovascular: Negative.   Gastrointestinal:  Positive for abdominal pain.  Genitourinary: Negative.   Musculoskeletal: Negative.   Skin: Negative.   Neurological: Negative.   Endo/Heme/Allergies: Negative.   Psychiatric/Behavioral: Negative.       Physical Exam:  weight is 154 lb 12 oz (70.2 kg). Her oral temperature is 97.7 F (36.5 C). Her blood pressure is 128/65 and her pulse is 70. Her respiration is 18 and oxygen saturation is 98%.   Wt Readings from Last 3 Encounters:  02/21/18 154 lb 12 oz (70.2 kg)  01/17/18 151 lb (68.5 kg)  10/30/17 148 lb (67.1 kg)    Physical Exam Vitals signs reviewed.  HENT:     Head: Normocephalic and atraumatic.  Eyes:     Pupils: Pupils are equal, round, and reactive to light.     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  Left eye is unremarkable.  She has good extraocular muscle movement of the left eye.  Neck:     Musculoskeletal:  Normal range of motion.  Cardiovascular:     Rate and Rhythm: Normal rate and regular rhythm.     Heart sounds: Normal heart sounds.  Pulmonary:     Effort: Pulmonary effort is normal.     Breath sounds: Normal breath sounds.  Abdominal:     General: Bowel sounds are normal.     Palpations: Abdomen is soft.  Musculoskeletal: Normal range of motion.        General: No tenderness or deformity.  Lymphadenopathy:     Cervical: No cervical adenopathy.  Skin:    General: Skin is warm and dry.     Findings: No erythema or rash.  Neurological:     Mental Status: She is alert and oriented to person, place, and time.  Psychiatric:        Behavior: Behavior normal.        Thought Content: Thought content normal.        Judgment: Judgment normal.      Lab Results  Component Value Date   WBC 7.3 02/21/2018   HGB 14.0 02/21/2018   HCT 42.5 02/21/2018   MCV 98.8 02/21/2018   PLT 314 02/21/2018   No results found for: FERRITIN, IRON, TIBC, UIBC, IRONPCTSAT Lab Results  Component Value Date   RBC 4.30 02/21/2018   No results found  for: KPAFRELGTCHN, LAMBDASER, KAPLAMBRATIO No results found for: IGGSERUM, IGA, IGMSERUM No results found for: Odetta Pink, SPEI   Chemistry      Component Value Date/Time   NA 140 02/21/2018 0806   NA 143 12/23/2016 0848   K 3.7 02/21/2018 0806   K 4.3 12/23/2016 0848   CL 101 02/21/2018 0806   CL 99 12/23/2016 0848   CO2 31 02/21/2018 0806   CO2 31 12/23/2016 0848   BUN 17 02/21/2018 0806   BUN 10 12/23/2016 0848   CREATININE 0.71 02/21/2018 0806   CREATININE 1.0 12/23/2016 0848      Component Value Date/Time   CALCIUM 9.6 02/21/2018 0806   CALCIUM 9.4 12/23/2016 0848   ALKPHOS 87 02/21/2018 0806   ALKPHOS 99 (H) 12/23/2016 0848   AST 18 02/21/2018 0806   ALT 21 02/21/2018 0806   ALT 26 12/23/2016 0848   BILITOT 0.3 02/21/2018 0806      Impression and Plan: Ms. 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 So far she has done well and CT scans today showed no evidence of metastatic disease of the chest, abdomen or pelvis.   We will plan for her next CT scan to be done in 6 months.  I think this would be reasonable to do.  Again, I told her that if she had issues with her abdomen, that she must go to the emergency room to have this evaluated.  I will see her back in 6 months.    [name], MD 2/12/202010:18 AM

## 2018-03-07 ENCOUNTER — Other Ambulatory Visit: Payer: Self-pay | Admitting: Family Medicine

## 2018-04-02 ENCOUNTER — Ambulatory Visit: Payer: 59 | Admitting: Family Medicine

## 2018-04-03 ENCOUNTER — Other Ambulatory Visit: Payer: Self-pay | Admitting: Family Medicine

## 2018-04-11 ENCOUNTER — Telehealth: Payer: Self-pay | Admitting: Internal Medicine

## 2018-04-11 MED ORDER — MODAFINIL 200 MG PO TABS: 200.0000 mg | ORAL_TABLET | Freq: Every day | ORAL | 2 refills | Status: DC

## 2018-04-11 NOTE — Telephone Encounter (Signed)
Called and spoke with pt. Pt is requesting a refill of modafinil 200mg  to be called into [facility]. Pt stated she had a f/u scheduled with [facility] in April but due to COVID-19, her appt was cancelled.  Dr. Annamaria Boots, please advise if you are okay refilling med for pt and if you can e-send it to pharmacy for pt. Thanks!  Allergies  Allergen Reactions  . Clarithromycin Diarrhea, Rash and Other (See Comments)    Severe diarrhea  Rash Other reaction(s): DIARRHEA  . Hydrocodone-Homatropine Other (See Comments)    paranoia  . Hydrocodone-Homatropine Other (See Comments)    paranoia  . Penicillins Other (See Comments)    Unknown reaction, childhood allergy Has patient had a PCN reaction causing immediate rash, facial/tongue/throat swelling, SOB or lightheadedness with hypotension: Unknown Has patient had a PCN reaction causing severe rash involving mucus membranes or skin necrosis: Unknown Has patient had a PCN reaction that required hospitalization: Unknown Has patient had a PCN reaction occurring within the last 10 years: No If all of the above answers are "NO", then may proceed with Cephalosporin use.  unknown Unknown reaction, childhood allergy  . Topiramate Other (See Comments)    WEIGHT LOSS Other reaction(s): WEIGHT LOSS WEIGHT LOSS  . Codeine Itching     Current Outpatient Medications:  .  albuterol (PROVENTIL HFA;VENTOLIN HFA) 108 (90 Base) MCG/ACT inhaler, Inhale 2 puffs into the lungs every 6 (six) hours as needed for wheezing or shortness of breath., Disp: 1 Inhaler, Rfl: 12 .  albuterol (PROVENTIL) (2.5 MG/3ML) 0.083% nebulizer solution, Take 3 mLs (2.5 mg total) by nebulization every 6 (six) hours as needed for wheezing or shortness of breath., Disp: 150 mL, Rfl: 11 .  atorvastatin (LIPITOR) 20 MG tablet, TAKE 1 TABLET BY MOUTH ONCE DAILY, Disp: 30 tablet, Rfl: 6 .  buPROPion (WELLBUTRIN XL) 150 MG 24 hr tablet, TAKE 1 TABLET BY MOUTH DAILY, Disp: 30 tablet, Rfl: 6 .  CHANTIX  CONTINUING MONTH PAK 1 MG tablet, TAKE 1 TABLET BY MOUTH TWICE DAILY, Disp: 60 tablet, Rfl: 1 .  doxycycline (VIBRA-TABS) 100 MG tablet, Take 1 tablet (100 mg total) by mouth 2 (two) times daily., Disp: 14 tablet, Rfl: 3 .  escitalopram (LEXAPRO) 20 MG tablet, TAKE 1 TABLET BY MOUTH DAILY, Disp: 30 tablet, Rfl: 6 .  fexofenadine (ALLEGRA) 180 MG tablet, Take 180 mg by mouth daily., Disp: , Rfl:  .  Fluticasone-Umeclidin-Vilant (TRELEGY ELLIPTA) 100-62.5-25 MCG/INH AEPB, Inhale 1 puff into the lungs daily., Disp: 60 each, Rfl: 11 .  modafinil (PROVIGIL) 200 MG tablet, TAKE 1 TABLET BY MOUTH EVERY DAY, Disp: 30 tablet, Rfl: 2 .  montelukast (SINGULAIR) 10 MG tablet, TAKE 1 TABLET BY MOUTH AT BEDTIME, Disp: 30 tablet, Rfl: prn .  omeprazole (PRILOSEC) 40 MG capsule, TAKE 1 CAPSULE BY MOUTH ONCE DAILY, Disp: 30 capsule, Rfl: 6 .  potassium chloride SA (K-DUR,KLOR-CON) 20 MEQ tablet, TAKE 1 TABLET BY MOUTH DAILY (Patient taking differently: TAKE 20 MEQ BY MOUTH DAILY), Disp: 30 tablet, Rfl: 6 .  PREVIDENT 5000 PLUS 1.1 % CREA dental cream, Place 1 application onto teeth at bedtime. , Disp: , Rfl:  .  Respiratory Therapy Supplies (FLUTTER) DEVI, Use as directed, Disp: 1 each, Rfl: 0 .  SUMAtriptan (IMITREX) 100 MG tablet, Take one tablet at onset of migraine, repeat in 2 hours if needed. (Patient taking differently: Take 100 mg by mouth every 2 (two) hours as needed for migraine or headache. ), Disp: 10 tablet, Rfl: 6 .  traMADol (ULTRAM) 50 MG tablet, Take 50 mg by mouth every 8 (eight) hours as needed for moderate pain., Disp: , Rfl:  .  valsartan-hydrochlorothiazide (DIOVAN-HCT) 160-25 MG tablet, TAKE 1 TABLET BY MOUTH DAILY, Disp: 30 tablet, Rfl: 3 .  zolmitriptan (ZOMIG) 5 MG nasal solution, Place 1 spray into the nose as needed for migraine. , Disp: , Rfl:

## 2018-04-11 NOTE — Telephone Encounter (Signed)
Modafinil refill e-sent 

## 2018-04-11 NOTE — Telephone Encounter (Signed)
Patient returning phone call.  Patient phone number is [phone].

## 2018-04-11 NOTE — Telephone Encounter (Signed)
lmom 

## 2018-05-01 ENCOUNTER — Ambulatory Visit: Payer: 59 | Admitting: Internal Medicine

## 2018-05-07 ENCOUNTER — Telehealth: Payer: Self-pay | Admitting: Internal Medicine

## 2018-05-07 NOTE — Telephone Encounter (Signed)
ATC Berenice Primas but was not able to reach her via the automated system.   Stated PA on [email or web address]  Key is [facility]. Determination will be reached in 72 hours. Will keep this message in triage for follow up.

## 2018-05-07 NOTE — Telephone Encounter (Signed)
PA has been approved until 01/10/2019.  Called [facility], they are aware medication has been approved.   Nothing further needed at time of call.

## 2018-05-21 ENCOUNTER — Encounter: Payer: Self-pay | Admitting: Internal Medicine

## 2018-05-21 ENCOUNTER — Ambulatory Visit (INDEPENDENT_AMBULATORY_CARE_PROVIDER_SITE_OTHER): Payer: Medicare Other | Admitting: Internal Medicine

## 2018-05-21 ENCOUNTER — Other Ambulatory Visit: Payer: Self-pay

## 2018-05-21 ENCOUNTER — Other Ambulatory Visit: Payer: Self-pay | Admitting: Orthopedic Surgery

## 2018-05-21 VITALS — BP 112/58 | HR 79 | Ht 62.0 in | Wt 156.8 lb

## 2018-05-21 DIAGNOSIS — J441 Chronic obstructive pulmonary disease with (acute) exacerbation: Secondary | ICD-10-CM

## 2018-05-21 DIAGNOSIS — J9611 Chronic respiratory failure with hypoxia: Secondary | ICD-10-CM | POA: Diagnosis not present

## 2018-05-21 MED ORDER — TIOTROPIUM BROMIDE-OLODATEROL 2.5-2.5 MCG/ACT IN AERS: 1.0000 | INHALATION_SPRAY | Freq: Two times a day (BID) | RESPIRATORY_TRACT | 0 refills | Status: DC

## 2018-05-21 NOTE — Progress Notes (Signed)
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 Pt notes increase in cough in past few days as well-slight yellow in color.  Singulair, Trelegy, albuterol HFA, neb albuterol, " They are watching a freckle in my left eye now-I am going to [facility]".  She is pending more cosmetic revision surgery right eyelid. Awareness of dyspnea varies, particularly associated with exertion.  Increased cough in the last 4 days with sputum beginning to turn yellow.  Not a definite bronchitis exacerbation yet but she would like to keep antibiotic on hand-discussed.  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  Trelegy often seems to wear out by mid afternoon and we discussed use of nebulizer machine to make up the difference. CT chest 08/21/2017 IMPRESSION: 1. No evidence of metastatic disease. 2. Aortic atherosclerosis (ICD10-170.0). Coronary artery calcification. 3.  Emphysema (ICD10-J43.9). 4. Left adrenal adenoma.  05/21/2018- 63 year old female former smoker followed for COPD GOLD III-IV, chronic hypoxic respiratory failure, idiopathic hypersomnia-  MSLT 4.1 minutes, complicated by depression, Occular melanoma/ R enucleation O2 2L sleep and prn APS -----pt states her breathing has been worse since LOV; SOB upon coming back to room, sat 94% on RA, uses O2 2L as needed, needs refills on Provigil (generic version) & rescue inhaler, takes Trelegy but feels it is nontherapeutic, been having to use rescue inhaler & neb medications more often Has not smoked in a year. Hoped for improvement- discussed normal progression of COPD. Has gained weight. Some wheeze and dry cough but no acute event.  Pending R thumb surgery. Denies anginal pain, palpitation, edema.  Follows with Oncology and Ophthalmology for occular melanoma. CT chest 02/21/2018 IMPRESSION: Stable pulmonary nodules 3 mm or less in size.  1. No evidence of metastatic disease. 2. Mild hepatomegaly. 3. Left adrenal adenoma. 4. Aortic atherosclerosis (ICD10-170.0). Coronary artery calcification. 5.  Emphysema (ICD10-J43.9).  ROS-see HPI   + = positive Constitutional:   No-   weight loss, night sweats, fevers, chills, +fatigue, lassitude. HEENT:   No-  headaches, difficulty swallowing, tooth/dental problems, sore throat,       No-  sneezing, itching, ear ache, +nasal congestion, +post nasal drip,  CV:  No-   chest pain, orthopnea, PND, swelling in lower extremities, anasarca, dizziness, palpitations Resp: + shortness of breath with exertion or at rest.                productive cough,  + non-productive cough,  No- coughing up of blood.               change in color of mucus.  + wheezing.   Skin: No-   rash or lesions. GI:  No-   heartburn, indigestion, abdominal pain, nausea, vomiting,  GU:  MS:  No-   joint pain or swelling.   Neuro-     nothing unusual Psych:  No- change  in mood or affect. No depression or anxiety.  No memory loss.  OBJ- Physical Exam   General- Alert, Oriented, Affect-appropriate,  + overweight Skin- clear  Lymphadenopathy- none Head- atraumatic             Eyes- + R prosthesis            Ears- Hearing, canals-normal            Nose- clear, no-Septal dev, mucus, polyps, erosion, perforation             Throat- Mallampati IV , mucosa clear/ not red , drainage- none, tonsils- small residual Neck- flexible , trachea midline, no stridor , thyroid nl, carotid no bruit Chest - symmetrical excursion , unlabored           Heart/CV- RRR , no murmur , no gallop  , no rub, nl s1 s2                           - JVD- none , edema- none, stasis changes- none, varices- none           Lung-  wheeze- none, cough-none , dullness-none, rub- none,                                + distant breath sounds           Chest wall-  Abd-  Br/ Gen/ Rectal- Not done, not indicated Extrem- cyanosis- none, clubbing, none, atrophy- none, strength- nl Neuro- grossly intact to observation

## 2018-05-21 NOTE — Patient Instructions (Addendum)
Refills sent for albuterol rescue inhaler and Provigil.   Let us know if you need a refill for the nebulizer solution  Ok to go ahead with the thumb surgery and Advil as needed  Samples x 2 Stiolto   Inhale 1 puff, twice daily. Try this instead of Trelegy.  Ok to use the nebulizer up to every 4-6 hours if needed  Please call if we can help

## 2018-05-21 NOTE — Progress Notes (Signed)
Patient seen in the office today and instructed on use of Stiolto.  Patient expressed understanding and demonstrated technique. 

## 2018-05-22 ENCOUNTER — Encounter: Payer: Self-pay | Admitting: Family Medicine

## 2018-05-22 ENCOUNTER — Ambulatory Visit (INDEPENDENT_AMBULATORY_CARE_PROVIDER_SITE_OTHER): Payer: Medicare Other | Admitting: Family Medicine

## 2018-05-22 ENCOUNTER — Telehealth: Payer: Self-pay | Admitting: Internal Medicine

## 2018-05-22 ENCOUNTER — Other Ambulatory Visit: Payer: Self-pay | Admitting: Internal Medicine

## 2018-05-22 VITALS — BP 143/71 | HR 72 | Temp 98.3°F | Ht 62.0 in | Wt 156.0 lb

## 2018-05-22 DIAGNOSIS — E663 Overweight: Secondary | ICD-10-CM | POA: Diagnosis not present

## 2018-05-22 DIAGNOSIS — I10 Essential (primary) hypertension: Secondary | ICD-10-CM | POA: Diagnosis not present

## 2018-05-22 DIAGNOSIS — E785 Hyperlipidemia, unspecified: Secondary | ICD-10-CM | POA: Diagnosis not present

## 2018-05-22 DIAGNOSIS — B351 Tinea unguium: Secondary | ICD-10-CM | POA: Diagnosis not present

## 2018-05-22 MED ORDER — ALBUTEROL SULFATE HFA 108 (90 BASE) MCG/ACT IN AERS: 2.0000 | INHALATION_SPRAY | Freq: Four times a day (QID) | RESPIRATORY_TRACT | 12 refills | Status: DC | PRN

## 2018-05-22 MED ORDER — MODAFINIL 200 MG PO TABS: 200.0000 mg | ORAL_TABLET | Freq: Every day | ORAL | 5 refills | Status: DC

## 2018-05-22 NOTE — Progress Notes (Signed)
Virtual Visit via Video   I connected with patient on 05/22/18 at 10:20 AM EDT by a video enabled telemedicine application and verified that I am speaking with the correct person using two identifiers.  Location patient: Home Location provider: [facility], Office Persons participating in the virtual visit: Patient, Provider, [facility] Romelle Starcher D)  I discussed the limitations of evaluation and management by telemedicine and the availability of in person appointments. The patient expressed understanding and agreed to proceed.  Subjective:   HPI:   HTN- chronic problem, on Valsartan HCTZ 160/25mg  daily.  Yesterday's BP at pulmonary was excellent.  Using home cuff today it is elevated.  Denies CP.  + SOB (seeing Pulm).  Denies HAs, edema.  Hyperlipidemia- chronic problem, on Lipitor 20mg  daily.  No abd pain, N/V.  Overweight- pt has gained 11 lbs since CPE in Sept.  Pt reports this is due to her quitting smoking.  Unable to exercise due to ongoing COPD (seeing Pulm)  Nail problem- pt attached pictures via MyChart.  'it just started'.  No hx of nail issues.  Is now developing 'crusty stuff on top' which peeled off but 'took 1/2 the layers of nail with it'.  R foot is more impacted but L foot is starting.    ROS:   See pertinent positives and negatives per HPI.  Patient Active Problem List   Diagnosis Date Noted  . Vitamin D deficiency 10/03/2017  . Special screening for malignant neoplasms, colon   . Benign neoplasm of sigmoid colon   . Benign neoplasm of descending colon   . Hyperlipidemia 06/28/2016  . Aortic atherosclerosis ([place]) 10/26/2015  . 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 Chronic respiratory failure with hypoxia ([place]) 03/05/2015  . Balance disorder 05/17/2013  . Routine general medical examination at a health care facility 12/21/2012  . Chest pain 12/21/2012  . Meralgia paresthetica 06/14/2012  . Palpitations 02/03/2012  . COPD exacerbation ([place])  04/08/2011  . HTN (hypertension) 04/08/2011  . Tobacco abuse 04/08/2011  . Eye dryness 08/20/2010  . IBS (irritable bowel syndrome) 08/20/2010  . Depression 05/25/2010  . Migraine with aura 05/25/2010  . Hypersomnia 10/31/2008  . STRESS INCONTINENCE 10/31/2008  . DYSPNEA 06/27/2008  . Seasonal and perennial allergic rhinitis 05/03/2008  . G E R D 05/03/2008  . COPD mixed type ([place]) 04/18/2008    Social History   Tobacco Use  . Smoking status: Former Smoker    Packs/day: 0.50    Years: 40.00    Pack years: 20.00    Types: Cigarettes    Last attempt to quit: 05/27/2017    Years since quitting: 0.9  . Smokeless tobacco: Never Used  . Tobacco comment: Bad day 6-7 cigs a day  Substance Use Topics  . Alcohol use: No    Alcohol/week: 0.0 standard drinks    Current Outpatient Medications:  .  albuterol (PROVENTIL HFA;VENTOLIN HFA) 108 (90 Base) MCG/ACT inhaler, Inhale 2 puffs into the lungs every 6 (six) hours as needed for wheezing or shortness of breath., Disp: 1 Inhaler, Rfl: 12 .  albuterol (PROVENTIL) (2.5 MG/3ML) 0.083% nebulizer solution, Take 3 mLs (2.5 mg total) by nebulization every 6 (six) hours as needed for wheezing or shortness of breath., Disp: 150 mL, Rfl: 11 .  atorvastatin (LIPITOR) 20 MG tablet, TAKE 1 TABLET BY MOUTH ONCE DAILY, Disp: 30 tablet, Rfl: 6 .  buPROPion (WELLBUTRIN XL) 150 MG 24 hr tablet, TAKE 1 TABLET BY MOUTH DAILY, Disp: 30 tablet,  Rfl: 6 .  CHANTIX CONTINUING MONTH PAK 1 MG tablet, TAKE 1 TABLET BY MOUTH TWICE DAILY, Disp: 60 tablet, Rfl: 1 .  escitalopram (LEXAPRO) 20 MG tablet, TAKE 1 TABLET BY MOUTH DAILY, Disp: 30 tablet, Rfl: 6 .  fexofenadine (ALLEGRA) 180 MG tablet, Take 180 mg by mouth daily., Disp: , Rfl:  .  Fluticasone-Umeclidin-Vilant (TRELEGY ELLIPTA) 100-62.5-25 MCG/INH AEPB, Inhale 1 puff into the lungs daily., Disp: 60 each, Rfl: 11 .  modafinil (PROVIGIL) 200 MG tablet, Take 1 tablet (200 mg total) by mouth daily., Disp: 30 tablet,  Rfl: 2 .  montelukast (SINGULAIR) 10 MG tablet, TAKE 1 TABLET BY MOUTH AT BEDTIME, Disp: 30 tablet, Rfl: prn .  omeprazole (PRILOSEC) 40 MG capsule, TAKE 1 CAPSULE BY MOUTH ONCE DAILY, Disp: 30 capsule, Rfl: 6 .  potassium chloride SA (K-DUR,KLOR-CON) 20 MEQ tablet, TAKE 1 TABLET BY MOUTH DAILY (Patient taking differently: TAKE 20 MEQ BY MOUTH DAILY), Disp: 30 tablet, Rfl: 6 .  PREVIDENT 5000 PLUS 1.1 % CREA dental cream, Place 1 application onto teeth at bedtime. , Disp: , Rfl:  .  Respiratory Therapy Supplies (FLUTTER) DEVI, Use as directed, Disp: 1 each, Rfl: 0 .  SUMAtriptan (IMITREX) 100 MG tablet, Take one tablet at onset of migraine, repeat in 2 hours if needed. (Patient taking differently: Take 100 mg by mouth every 2 (two) hours as needed for migraine or headache. ), Disp: 10 tablet, Rfl: 6 .  Tiotropium Bromide-Olodaterol (STIOLTO RESPIMAT) 2.5-2.5 MCG/ACT AERS, Inhale 1 puff into the lungs 2 (two) times a day., Disp: 2 Inhaler, Rfl: 0 .  traMADol (ULTRAM) 50 MG tablet, Take 50 mg by mouth every 8 (eight) hours as needed for moderate pain., Disp: , Rfl:  .  valsartan-hydrochlorothiazide (DIOVAN-HCT) 160-25 MG tablet, TAKE 1 TABLET BY MOUTH DAILY, Disp: 30 tablet, Rfl: 3 .  zolmitriptan (ZOMIG) 5 MG nasal solution, Place 1 spray into the nose as needed for migraine. , Disp: , Rfl:  .  doxycycline (VIBRA-TABS) 100 MG tablet, Take 1 tablet (100 mg total) by mouth 2 (two) times daily., Disp: 14 tablet, Rfl: 3  Allergies  Allergen Reactions  . Clarithromycin Diarrhea, Rash and Other (See Comments)    Severe diarrhea  Rash Other reaction(s): DIARRHEA  . Hydrocodone-Homatropine Other (See Comments)    paranoia  . Hydrocodone-Homatropine Other (See Comments)    paranoia  . Penicillins Other (See Comments)    Unknown reaction, childhood allergy Has patient had a PCN reaction causing immediate rash, facial/tongue/throat swelling, SOB or lightheadedness with hypotension: Unknown Has patient  had a PCN reaction causing severe rash involving mucus membranes or skin necrosis: Unknown Has patient had a PCN reaction that required hospitalization: Unknown Has patient had a PCN reaction occurring within the last 10 years: No If all of the above answers are "NO", then may proceed with Cephalosporin use.  unknown Unknown reaction, childhood allergy  . Topiramate Other (See Comments)    WEIGHT LOSS Other reaction(s): WEIGHT LOSS WEIGHT LOSS  . Codeine Itching    Objective:   BP (!) 143/71   Pulse 72   Temp 98.3 F (36.8 C)   Ht 5\' 2"  (1.575 m)   Wt 156 lb (70.8 kg)   SpO2 94%   BMI 28.53 kg/m   AAOx3, NAD NCAT, EOMI No obvious CN deficits Coloring WNL Pt is able to speak clearly, coherently without shortness of breath or increased work of breathing.  Thought process is linear.  Mood is appropriate.  Assessment and Plan:   HTN- BP was excellent yesterday at pulmonary.  Today using her home wrist cuff was elevated but I suspect that is not a true reading.  No med changes at this time.  Will follow.  Hyperlipidemia- chronic problem.  Tolerating statin w/o difficulty.  Pt is not comfortable coming for labs at this time.  Will follow.  Overweight- pt has gained 10 lbs since she quit smoking.  Due to her COPD she has difficulty exercising but encouraged her to do some walking- even if it's just around the house to improve her stamina.  Will follow.  Nail fungus- new.  Mild per photos.  Start Funginail.  Pt expressed understanding and is in agreement w/ plan.    Annye Asa, MD 05/22/2018

## 2018-05-22 NOTE — Telephone Encounter (Signed)
My apologies. Refills e-sent.

## 2018-05-22 NOTE — Progress Notes (Signed)
I have discussed the procedure for the virtual visit with the patient who has given consent to proceed with assessment and treatment.   Amy Gothard, CMA     

## 2018-05-22 NOTE — Progress Notes (Signed)
Spoke to pharmacy and verified scripts were there.  Spoke with pt and relayed scripts sent to preferred pharmacy.  Nothing further is needed.

## 2018-05-22 NOTE — Progress Notes (Signed)
Documentation- refills e-sent for Provigil and albuterol hfa

## 2018-05-22 NOTE — Telephone Encounter (Signed)
Spoke to pharmacy and they received the scripts.  Spoke with pt and informed her that scripts were at pharmacy.  Nothing further is needed.

## 2018-05-28 NOTE — Progress Notes (Signed)
Chart reviewed with Dr Marcell Barlow, pt had hx COPD and is on home O2 @ 2l/[place] at night and prn. Her last OV with Dr Keturah Barre was 05-21-18 and her RA sat after walking was 94%. She will come in for labs and EKG and we will check a R/A sat then.

## 2018-05-29 ENCOUNTER — Encounter (HOSPITAL_BASED_OUTPATIENT_CLINIC_OR_DEPARTMENT_OTHER): Payer: Self-pay | Admitting: *Deleted

## 2018-05-29 ENCOUNTER — Other Ambulatory Visit: Payer: Self-pay

## 2018-06-01 ENCOUNTER — Encounter (HOSPITAL_BASED_OUTPATIENT_CLINIC_OR_DEPARTMENT_OTHER)
Admission: RE | Admit: 2018-06-01 | Discharge: 2018-06-01 | Disposition: A | Discharge status: Home / Self Care | Payer: Medicare Other | Source: Ambulatory Visit | Attending: Orthopedic Surgery | Admitting: Orthopedic Surgery

## 2018-06-01 ENCOUNTER — Other Ambulatory Visit: Payer: Self-pay

## 2018-06-01 ENCOUNTER — Other Ambulatory Visit (HOSPITAL_COMMUNITY)
Admission: RE | Admit: 2018-06-01 | Discharge: 2018-06-01 | Disposition: A | Discharge status: Home / Self Care | Payer: Medicare Other | Source: Ambulatory Visit | Attending: Orthopedic Surgery | Admitting: Orthopedic Surgery

## 2018-06-01 DIAGNOSIS — Z01818 Encounter for other preprocedural examination: Secondary | ICD-10-CM | POA: Insufficient documentation

## 2018-06-01 DIAGNOSIS — R9431 Abnormal electrocardiogram [ECG] [EKG]: Secondary | ICD-10-CM | POA: Insufficient documentation

## 2018-06-01 DIAGNOSIS — Z1159 Encounter for screening for other viral diseases: Secondary | ICD-10-CM | POA: Insufficient documentation

## 2018-06-01 DIAGNOSIS — I4510 Unspecified right bundle-branch block: Secondary | ICD-10-CM | POA: Insufficient documentation

## 2018-06-01 LAB — BASIC METABOLIC PANEL
Anion gap: 12 (ref 5–15)
BUN: 15 mg/dL (ref 8–23)
CO2: 25 mmol/L (ref 22–32)
Calcium: 9.4 mg/dL (ref 8.9–10.3)
Chloride: 102 mmol/L (ref 98–111)
Creatinine, Ser: 0.57 mg/dL (ref 0.44–1.00)
GFR calc Af Amer: >60 mL/min (ref >60)
GFR calc non Af Amer: >60 mL/min (ref >60)
Glucose, Bld: 99 mg/dL (ref 70–99)
Potassium: 3.7 mmol/L (ref 3.5–5.1)
Sodium: 139 mmol/L (ref 135–145)

## 2018-06-01 NOTE — Progress Notes (Addendum)
O2 sat = 94-95% on room air during PAT appointment, with mask on.  Ensure pre surgery drink given with instructions to complete by [facility], pt verbalized understanding.

## 2018-06-02 LAB — NOVEL CORONAVIRUS, NAA (HOSP ORDER, SEND-OUT TO REF LAB; TAT 18-24 HRS): SARS-CoV-2, NAA: NOT DETECTED

## 2018-06-05 ENCOUNTER — Encounter (HOSPITAL_BASED_OUTPATIENT_CLINIC_OR_DEPARTMENT_OTHER)
Admission: RE | Disposition: A | Discharge status: Home / Self Care | Payer: Self-pay | Source: Home / Self Care | Attending: Orthopedic Surgery

## 2018-06-05 ENCOUNTER — Ambulatory Visit (HOSPITAL_BASED_OUTPATIENT_CLINIC_OR_DEPARTMENT_OTHER)
Admission: RE | Admit: 2018-06-05 | Discharge: 2018-06-05 | Disposition: A | Discharge status: Home / Self Care | Payer: Medicare Other | Attending: Orthopedic Surgery | Admitting: Orthopedic Surgery

## 2018-06-05 ENCOUNTER — Ambulatory Visit (HOSPITAL_BASED_OUTPATIENT_CLINIC_OR_DEPARTMENT_OTHER): Payer: Medicare Other | Admitting: Anesthesiology

## 2018-06-05 ENCOUNTER — Encounter (HOSPITAL_BASED_OUTPATIENT_CLINIC_OR_DEPARTMENT_OTHER): Payer: Self-pay | Admitting: Anesthesiology

## 2018-06-05 DIAGNOSIS — J439 Emphysema, unspecified: Secondary | ICD-10-CM | POA: Insufficient documentation

## 2018-06-05 DIAGNOSIS — I10 Essential (primary) hypertension: Secondary | ICD-10-CM | POA: Diagnosis not present

## 2018-06-05 DIAGNOSIS — I452 Bifascicular block: Secondary | ICD-10-CM | POA: Insufficient documentation

## 2018-06-05 DIAGNOSIS — K219 Gastro-esophageal reflux disease without esophagitis: Secondary | ICD-10-CM | POA: Insufficient documentation

## 2018-06-05 DIAGNOSIS — Z9981 Dependence on supplemental oxygen: Secondary | ICD-10-CM | POA: Insufficient documentation

## 2018-06-05 DIAGNOSIS — Z87891 Personal history of nicotine dependence: Secondary | ICD-10-CM | POA: Diagnosis not present

## 2018-06-05 DIAGNOSIS — F419 Anxiety disorder, unspecified: Secondary | ICD-10-CM | POA: Diagnosis not present

## 2018-06-05 DIAGNOSIS — Z833 Family history of diabetes mellitus: Secondary | ICD-10-CM | POA: Diagnosis not present

## 2018-06-05 DIAGNOSIS — M65311 Trigger thumb, right thumb: Secondary | ICD-10-CM | POA: Insufficient documentation

## 2018-06-05 DIAGNOSIS — M1811 Unilateral primary osteoarthritis of first carpometacarpal joint, right hand: Secondary | ICD-10-CM | POA: Insufficient documentation

## 2018-06-05 DIAGNOSIS — F329 Major depressive disorder, single episode, unspecified: Secondary | ICD-10-CM | POA: Diagnosis not present

## 2018-06-05 DIAGNOSIS — I700 Atherosclerosis of aorta: Secondary | ICD-10-CM | POA: Diagnosis not present

## 2018-06-05 HISTORY — PX: TRIGGER FINGER RELEASE: SHX641

## 2018-06-05 HISTORY — DX: Anxiety disorder, unspecified: F41.9

## 2018-06-05 HISTORY — DX: Depression, unspecified: F32.A

## 2018-06-05 SURGERY — RELEASE, A1 PULLEY, FOR TRIGGER FINGER
Anesthesia: Regional | Site: Hand | Laterality: Right

## 2018-06-05 MED ORDER — VANCOMYCIN HCL IN DEXTROSE 1-5 GM/200ML-% IV SOLN
1000.0000 mg | INTRAVENOUS | Status: AC
  Administered 2018-06-05: 1000 mg via INTRAVENOUS

## 2018-06-05 MED ORDER — FENTANYL CITRATE (PF) 100 MCG/2ML IJ SOLN
50.0000 ug | INTRAMUSCULAR | Status: DC | PRN
  Administered 2018-06-05 (×2): 50 ug via INTRAVENOUS

## 2018-06-05 MED ORDER — MIDAZOLAM HCL 2 MG/2ML IJ SOLN
INTRAMUSCULAR | Status: AC
  Filled 2018-06-05: qty 2

## 2018-06-05 MED ORDER — MIDAZOLAM HCL 2 MG/2ML IJ SOLN
1.0000 mg | INTRAMUSCULAR | Status: DC | PRN
  Administered 2018-06-05: 2 mg via INTRAVENOUS

## 2018-06-05 MED ORDER — ONDANSETRON HCL 4 MG/2ML IJ SOLN
INTRAMUSCULAR | Status: AC
  Filled 2018-06-05: qty 2

## 2018-06-05 MED ORDER — BUPIVACAINE HCL (PF) 0.25 % IJ SOLN
INTRAMUSCULAR | Status: DC | PRN
  Administered 2018-06-05: 5 mL

## 2018-06-05 MED ORDER — LACTATED RINGERS IV SOLN
INTRAVENOUS | Status: DC
  Administered 2018-06-05: 09:00:00 via INTRAVENOUS

## 2018-06-05 MED ORDER — SCOPOLAMINE 1 MG/3DAYS TD PT72: 1.0000 | MEDICATED_PATCH | Freq: Once | TRANSDERMAL | Status: DC | PRN

## 2018-06-05 MED ORDER — LIDOCAINE HCL (PF) 0.5 % IJ SOLN
INTRAMUSCULAR | Status: DC | PRN
  Administered 2018-06-05: 30 mL via INTRAVENOUS

## 2018-06-05 MED ORDER — FENTANYL CITRATE (PF) 100 MCG/2ML IJ SOLN
INTRAMUSCULAR | Status: AC
  Filled 2018-06-05: qty 2

## 2018-06-05 MED ORDER — PROPOFOL 500 MG/50ML IV EMUL
INTRAVENOUS | Status: DC | PRN
  Administered 2018-06-05: 25 ug/kg/min via INTRAVENOUS

## 2018-06-05 MED ORDER — TRAMADOL HCL 50 MG PO TABS: 50.0000 mg | ORAL_TABLET | Freq: Four times a day (QID) | ORAL | 0 refills | Status: DC | PRN

## 2018-06-05 MED ORDER — VANCOMYCIN HCL IN DEXTROSE 1-5 GM/200ML-% IV SOLN
INTRAVENOUS | Status: AC
  Filled 2018-06-05: qty 200

## 2018-06-05 MED ORDER — CHLORHEXIDINE GLUCONATE 4 % EX LIQD: 60.0000 mL | Freq: Once | CUTANEOUS | Status: DC

## 2018-06-05 SURGICAL SUPPLY — 33 items
BLADE SURG 15 STRL LF DISP TIS (BLADE) ×1 IMPLANT
BLADE SURG 15 STRL SS (BLADE) ×1
BNDG COHESIVE 2X5 TAN STRL LF (GAUZE/BANDAGES/DRESSINGS) ×2 IMPLANT
BNDG ESMARK 4X9 LF (GAUZE/BANDAGES/DRESSINGS) IMPLANT
CHLORAPREP W/TINT 26 (MISCELLANEOUS) ×2 IMPLANT
CORD BIPOLAR FORCEPS 12FT (ELECTRODE) IMPLANT
COVER BACK TABLE REUSABLE LG (DRAPES) ×2 IMPLANT
COVER MAYO STAND REUSABLE (DRAPES) ×2 IMPLANT
COVER WAND RF STERILE (DRAPES) IMPLANT
CUFF TOURN SGL QUICK 18X4 (TOURNIQUET CUFF) IMPLANT
DECANTER SPIKE VIAL GLASS SM (MISCELLANEOUS) IMPLANT
DRAPE EXTREMITY T 121X128X90 (DISPOSABLE) ×2 IMPLANT
DRAPE SURG 17X23 STRL (DRAPES) ×2 IMPLANT
GAUZE SPONGE 4X4 12PLY STRL (GAUZE/BANDAGES/DRESSINGS) ×2 IMPLANT
GAUZE XEROFORM 1X8 LF (GAUZE/BANDAGES/DRESSINGS) ×2 IMPLANT
GLOVE BIO SURGEON STRL SZ 6.5 (GLOVE) ×1 IMPLANT
GLOVE BIOGEL PI IND STRL 8.5 (GLOVE) ×1 IMPLANT
GLOVE BIOGEL PI INDICATOR 8.5 (GLOVE) ×1
GLOVE SURG ORTHO 8.0 STRL STRW (GLOVE) ×2 IMPLANT
GLOVE SURG SS PI 6.5 STRL IVOR (GLOVE) ×1 IMPLANT
GOWN STRL REUS W/ TWL LRG LVL3 (GOWN DISPOSABLE) ×1 IMPLANT
GOWN STRL REUS W/TWL LRG LVL3 (GOWN DISPOSABLE) ×1
GOWN STRL REUS W/TWL XL LVL3 (GOWN DISPOSABLE) ×2 IMPLANT
NDL PRECISIONGLIDE 27X1.5 (NEEDLE) ×1 IMPLANT
NEEDLE PRECISIONGLIDE 27X1.5 (NEEDLE) ×2 IMPLANT
NS IRRIG 1000ML POUR BTL (IV SOLUTION) ×2 IMPLANT
PACK BASIN DAY SURGERY FS (CUSTOM PROCEDURE TRAY) ×2 IMPLANT
STOCKINETTE 4X48 STRL (DRAPES) ×2 IMPLANT
SUT ETHILON 4 0 PS 2 18 (SUTURE) ×2 IMPLANT
SYR BULB 3OZ (MISCELLANEOUS) ×2 IMPLANT
SYR CONTROL 10ML LL (SYRINGE) ×2 IMPLANT
TOWEL GREEN STERILE FF (TOWEL DISPOSABLE) ×4 IMPLANT
UNDERPAD 30X30 (UNDERPADS AND DIAPERS) ×2 IMPLANT

## 2018-06-05 NOTE — Op Note (Signed)
NAME: Carol Quinn [facility] MEDICAL RECORD NO: [id number] DATE OF BIRTH: 10/30/1955 [facility] LOCATION: [facility] PHYSICIAN: Wynonia Sours, MD   OPERATIVE REPORT   DATE OF PROCEDURE: 06/05/18    PREOPERATIVE DIAGNOSIS:   Stenosing tenosynovitis right thumb   POSTOPERATIVE DIAGNOSIS:   Same   PROCEDURE:   Release A1 pulley right thumb   SURGEON: Daryll Brod, M.D.   ASSISTANT: none   ANESTHESIA:  Bier block with sedation and Local   INTRAVENOUS FLUIDS:  Per anesthesia flow sheet.   ESTIMATED BLOOD LOSS:  Minimal.   COMPLICATIONS:  None.   SPECIMENS:  none   TOURNIQUET TIME:    Total Tourniquet Time Documented: Forearm (Right) - 13 minutes Total: Forearm (Right) - 13 minutes    DISPOSITION:  Stable to PACU.   INDICATIONS: Patient is a 63 year old female with a history of triggering of her right thumb.  This not responded to conservative treatment including multiple injections.  She is admitted now for release of the A1 pulley.  Pre-peri-and postoperative course been discussed along with risks and complications.  She is aware that there is no guarantee to the surgery the possibility of infection recurrence injury to arteries nerves tendons complete relief symptoms and dystrophy.  In the preoperative area the patient is seen extremity marked by both patient and surgeon antibiotic given  OPERATIVE COURSE: Patient is brought to the operating room where form based IV regional anesthetic was carried out without difficulty under the direction of the anesthesia department.  She was prepped using ChloraPrep in a supine position with the right arm free.  Three-minute dry time was allowed timeout taken to confirm patient procedure.  Transverse incision was made over the A1 pulley of the right thumb carried down through subcutaneous tissue.  Neurovascular structures were identified protected radially and ulnarly.  The A1 pulley was found to be markedly thickened.  This  was released on its radial aspect.  Care was taken to maintain the oblique pulley.  Tenosynovial tissue proximally was separated with blunt dissection.  The thumb was placed through full range of motion no further triggering was noted.  The wound was copiously irrigated with saline.  The skin was closed and opted for nylon sutures.  Local infiltration quarter percent bupivacaine without epinephrine was given approximately 6 cc was used.  A sterile compressive dressing with a thumb free was applied.  Deflation of the tourniquet all fingers immediately pink.  She was taken to the recovery room for observation in satisfactory condition.  She will be discharged home to return the hand center [facility] in 1 week on Tylenol ibuprofen with Ultram as a backup.   Daryll Brod, MD Electronically signed, 06/05/18

## 2018-06-05 NOTE — Brief Op Note (Signed)
06/05/2018  9:59 AM  PATIENT:  Carol Quinn  63 y.o. female  PRE-OPERATIVE DIAGNOSIS:  TRIGGER RIGHT THUMB  POST-OPERATIVE DIAGNOSIS:  TRIGGER RIGHT THUMB  PROCEDURE:  Procedure(s): RIGHT THUMB RELEASE TRIGGER FINGER/A-1 PULLEY (Right)  SURGEON:  Surgeon(s) and Role:    * Daryll Brod, MD - Primary  PHYSICIAN ASSISTANT:   ASSISTANTS: none   ANESTHESIA:   local, regional and IV sedation  EBL: 17ml  BLOOD ADMINISTERED:none  DRAINS: none   LOCAL MEDICATIONS USED:  BUPIVICAINE   SPECIMEN:  No Specimen  DISPOSITION OF SPECIMEN:  N/A  COUNTS:  YES  TOURNIQUET:   Total Tourniquet Time Documented: Forearm (Right) - 13 minutes Total: Forearm (Right) - 13 minutes   DICTATION: .Dragon Dictation  PLAN OF CARE: Discharge to home after PACU  PATIENT DISPOSITION:  PACU - hemodynamically stable.

## 2018-06-05 NOTE — Transfer of Care (Signed)
Immediate Anesthesia Transfer of Care Note  Patient: Carol Quinn  Procedure(s) Performed: RIGHT THUMB RELEASE TRIGGER FINGER/A-1 PULLEY (Right Hand)  Patient Location: PACU  Anesthesia Type:MAC  Level of Consciousness: awake, alert  and oriented  Airway & Oxygen Therapy: Patient Spontanous Breathing and Patient connected to nasal cannula oxygen  Post-op Assessment: Report given to RN and Post -op Vital signs reviewed and stable  Post vital signs: Reviewed and stable  Last Vitals:  Vitals Value Taken Time  BP    Temp    Pulse 75 06/05/2018 10:00 AM  Resp 15 06/05/2018 10:00 AM  SpO2 100 % 06/05/2018 10:00 AM  Vitals shown include unvalidated device data.  Last Pain:  Vitals:   06/05/18 0826  TempSrc: Oral  PainSc: 5          Complications: No apparent anesthesia complications

## 2018-06-05 NOTE — H&P (Signed)
Carol Quinn is an 63 y.o. female.   Chief Complaint: catching right thumb HPI: Carol Quinn is a 63 yo female with stenosing tenosynovitis right thumb CMC arthritis.  She has had 2 injections to the trigger thumb. She continues to complain of pain in the thumb with pain at the metacarpal phalangeal joint to lesser extent [facility] joint this mild to moderate in nature at the metacarpal phalangeal joint worse in the morning catching continues. She has not plan of any numbness or tingling. This has been going on since before Christmas 2019. She has no history of diabetes thyroid problems she does have history of arthritis there is no history of gout. Family history is positive diabetes negative for the remainder. Past Medical History:    Past Medical History:  Diagnosis Date  . Acute bronchitis   . Allergic rhinitis   . Anxiety   . Aortic atherosclerosis ([place])   . Cancer ([place])   . Childhood asthma   . Chronic headaches   . Complication of anesthesia    woke up during surgery x 1  . Complication of anesthesia    hard to wake up x 1  . COPD (chronic obstructive pulmonary disease) ([facility])    emphysema  . Depression   . Emphysema, unspecified ([place])   . Exposure to TB    uncle-her PPD neg  . GERD (gastroesophageal reflux disease)   . Hepatic cyst    left  . Hypertension   . MDS (myelodysplastic syndrome), low grade ([place]) 09/23/2016  . Ocular melanoma, right ([place])   . RBBB (right bundle branch block with left anterior fascicular block)     Past Surgical History:  Procedure Laterality Date  . BREAST BIOPSY  2002  . COLONOSCOPY WITH PROPOFOL N/A 03/21/2017   Procedure: COLONOSCOPY WITH PROPOFOL;  Surgeon: Jerene Bears, MD;  Location: [facility];  Service: Gastroenterology;  Laterality: N/A;  . NOSE SURGERY     "nasal deviation"  . TOTAL ABDOMINAL HYSTERECTOMY    . TUBAL LIGATION      Family History  Problem Relation Age of Onset  . Heart failure Father   . Emphysema Mother   . Heart  attack Mother   . Asthma Sister   . Asthma Maternal Uncle   . Arthritis Maternal Grandmother   . Cancer Maternal Grandmother   . Cancer Sister   . Cancer Other        aunts  . Cancer Other        uncles  . Colon cancer Other    Social History:  reports that she quit smoking about 6 months ago. Her smoking use included cigarettes. She has a 20.00 pack-year smoking history. She has never used smokeless tobacco. She reports current alcohol use. She reports that she does not use drugs.  Allergies:  Allergies  Allergen Reactions  . Clarithromycin Diarrhea, Rash and Other (See Comments)    Severe diarrhea  Rash Other reaction(s): DIARRHEA  . Hydrocodone-Homatropine Other (See Comments)    paranoia  . Hydrocodone-Homatropine Other (See Comments)    paranoia  . Penicillins Other (See Comments)    Unknown reaction, childhood allergy Has patient had a PCN reaction causing immediate rash, facial/tongue/throat swelling, SOB or lightheadedness with hypotension: Unknown Has patient had a PCN reaction causing severe rash involving mucus membranes or skin necrosis: Unknown Has patient had a PCN reaction that required hospitalization: Unknown Has patient had a PCN reaction occurring within the last 10 years: No If all of the above  answers are "NO", then may proceed with Cephalosporin use.  unknown Unknown reaction, childhood allergy  . Topiramate Other (See Comments)    WEIGHT LOSS Other reaction(s): WEIGHT LOSS WEIGHT LOSS  . Codeine Itching    No medications prior to admission.    No results found for this or any previous visit (from the past 48 hour(s)).  No results found.   Pertinent items are noted in HPI.  Height 5\' 2"  (1.575 m), weight 70.8 kg.  General appearance: alert, cooperative and appears stated age Head: Normocephalic, without obvious abnormality Neck: no JVD Resp: clear to auscultation bilaterally Cardio: regular rate and rhythm, S1, S2 normal, no murmur,  click, rub or gallop GI: soft, non-tender; bowel sounds normal; no masses,  no organomegaly Extremities: catching right thumb Pulses: 2+ and symmetric Skin: Skin color, texture, turgor normal. No rashes or lesions Neurologic: Grossly normal Incision/Wound: na  Assessment/Plan Assessment:  1. Trigger finger of right thumb  2. Primary osteoarthritis of first carpometacarpal joint of right hand    Plan: We have discussed surgical release with her. Pre-peri-and postoperative course are discussed along with risks and applications. She is aware there is no guarantee to the surgery the possibility of infection recurrence injury to arteries nerves tendons complete relief symptoms and dystrophy. He is scheduled as an outpatient under regional anesthesia for release A1 pulley right thumb.   Daryll Brod 06/05/2018, 5:31 AM

## 2018-06-05 NOTE — Discharge Instructions (Addendum)

## 2018-06-05 NOTE — Anesthesia Preprocedure Evaluation (Signed)
Anesthesia Evaluation  Patient identified by MRN, date of birth, ID band Patient awake    Reviewed: Allergy & Precautions, NPO status , Patient's Chart, lab work & pertinent test results  Airway Mallampati: II  TM Distance: >3 FB Neck ROM: Full    Dental no notable dental hx.    Pulmonary COPD,  oxygen dependent, former smoker,    Pulmonary exam normal breath sounds clear to auscultation       Cardiovascular hypertension, Normal cardiovascular exam Rhythm:Regular Rate:Normal  RBBB   Neuro/Psych negative neurological ROS  negative psych ROS   GI/Hepatic Neg liver ROS, GERD  ,  Endo/Other  negative endocrine ROS  Renal/GU negative Renal ROS  negative genitourinary   Musculoskeletal negative musculoskeletal ROS (+)   Abdominal   Peds negative pediatric ROS (+)  Hematology negative hematology ROS (+)   Anesthesia Other Findings   Reproductive/Obstetrics negative OB ROS                             Anesthesia Physical Anesthesia Plan  ASA: III  Anesthesia Plan: Bier Block and Bier Block-LIDOCAINE ONLY   Post-op Pain Management:    Induction:   PONV Risk Score and Plan: 2 and Treatment may vary due to age or medical condition  Airway Management Planned: Simple Face Mask and Nasal Cannula  Additional Equipment:   Intra-op Plan:   Post-operative Plan:   Informed Consent: I have reviewed the patients History and Physical, chart, labs and discussed the procedure including the risks, benefits and alternatives for the proposed anesthesia with the patient or authorized representative who has indicated his/her understanding and acceptance.     Dental advisory given  Plan Discussed with:   Anesthesia Plan Comments:         Anesthesia Quick Evaluation

## 2018-06-05 NOTE — Anesthesia Procedure Notes (Signed)
Anesthesia Regional Block: Bier block (IV Regional)   Pre-Anesthetic Checklist: ,, timeout performed, Correct Patient, Correct Site, Correct Laterality, Correct Procedure,, site marked, surgical consent,, at surgeon's request  Laterality: Right     Needles:  Injection technique: Single-shot  Needle Type: Other      Needle Gauge: 22     Additional Needles:   Procedures:,,,,, intact distal pulses, Esmarch exsanguination, single tourniquet utilized,  Narrative:  Start time: 06/05/2018 9:40 AM End time: 06/05/2018 9:40 AM  Performed by: Personally

## 2018-06-06 ENCOUNTER — Encounter (HOSPITAL_BASED_OUTPATIENT_CLINIC_OR_DEPARTMENT_OTHER): Payer: Self-pay | Admitting: Orthopedic Surgery

## 2018-06-06 NOTE — Anesthesia Postprocedure Evaluation (Signed)
Anesthesia Post Note  Patient: Carol Quinn  Procedure(s) Performed: RIGHT THUMB RELEASE TRIGGER FINGER/A-1 PULLEY (Right Hand)     Patient location during evaluation: PACU Anesthesia Type: Bier Block Level of consciousness: awake and alert Pain management: pain level controlled Vital Signs Assessment: post-procedure vital signs reviewed and stable Respiratory status: spontaneous breathing, nonlabored ventilation, respiratory function stable and patient connected to nasal cannula oxygen Cardiovascular status: stable and blood pressure returned to baseline Postop Assessment: no apparent nausea or vomiting Anesthetic complications: no    Last Vitals:  Vitals:   06/05/18 1030 06/05/18 1043  BP: 120/65 138/70  Pulse: 69 79  Resp: 13 16  Temp:  36.6 C  SpO2: 96% 98%    Last Pain:  Vitals:   06/05/18 1043  TempSrc: Oral  PainSc: 0-No pain                 Montez Hageman

## 2018-06-07 NOTE — Assessment & Plan Note (Signed)
Doubt nonspecific exacerbation- favor simply weight gain/ deconditioning. Should  be ok to proceed with necessary thumb surgery. Plan - refill provigil, albuterol rescue inhaler and neb solution. Try changing Trelegy to samples of Stiolto for comparison.

## 2018-06-07 NOTE — Assessment & Plan Note (Signed)
She remains dependent on O2 2l for sleep and as needed for exertion.Marland Kitchen

## 2018-06-08 ENCOUNTER — Telehealth: Payer: Self-pay

## 2018-06-08 MED ORDER — DOXYCYCLINE HYCLATE 100 MG PO TABS: 100.0000 mg | ORAL_TABLET | Freq: Two times a day (BID) | ORAL | 0 refills | Status: DC

## 2018-06-08 MED ORDER — PREDNISONE 10 MG PO TABS: 20.0000 mg | ORAL_TABLET | Freq: Every day | ORAL | 0 refills | Status: AC

## 2018-06-08 NOTE — Telephone Encounter (Signed)
Primary Pulmonologist: CY  Last office visit and with whom: 05/21/18 with CY What do we see them for (pulmonary problems): COPD Last OV assessment/plan:  Refills sent for albuterol rescue inhaler and Provigil.   Let us know if you need a refill for the nebulizer solution  Ok to go ahead with the thumb surgery and Advil as needed  Samples x 2 Stiolto   Inhale 1 puff, twice daily. Try this instead of Trelegy.  Ok to use the nebulizer up to every 4-6 hours if needed  Please call if we can help      Assessment & Plan Note by Deneise Lever, MD at 06/07/2018 9:37 PM  Author: Deneise Lever, MD Author Type: Physician Filed: 06/07/2018 9:37 PM  Note Status: Written Cosign: Cosign Not Required Encounter Date: 05/21/2018  Problem: Chronic respiratory failure with hypoxia [facility])  Editor: Deneise Lever, MD (Physician)    She remains dependent on O2 2l for sleep and as needed for exertion..    Assessment & Plan Note by Deneise Lever, MD at 06/07/2018 9:33 PM  Author: Deneise Lever, MD Author Type: Physician Filed: 06/07/2018 9:36 PM  Note Status: Written Cosign: Cosign Not Required Encounter Date: 05/21/2018  Problem: COPD exacerbation  Editor: Deneise Lever, MD (Physician)    Doubt nonspecific exacerbation- favor simply weight gain/ deconditioning. Should  be ok to proceed with necessary thumb surgery. Plan - refill provigil, albuterol rescue inhaler and neb solution. Try changing Trelegy to samples of Stiolto for comparison.        Was appointment offered to patient (explain)?  Patient refused since she was just here on 05/21/18   Reason for call:  Spoke with patient. She stated that she has developed a URI. Symptoms started on Tuesday 06/05/18. She has a productive cough with green mucus. Runny nose with clear discharge. She denied any fevers or body aches. She denied being around anyone with COVID. She was actually tested last week before her surgery and the test came  back negative.   She wants to know if she can have an antibiotic and round of prednisone called in for her.   Pharmacy is [facility].   Tonya, please advise since CY is not here today. Thanks!

## 2018-06-08 NOTE — Telephone Encounter (Signed)
Spoke with patient. She is aware of Tonya's recommendations. Verbalized understanding.   Nothing further needed at time of call.

## 2018-06-08 NOTE — Telephone Encounter (Signed)
I sent in an order for prednisone and doxycycline. Please make sure she is taking an OTC antihistamine. She can also take mucinex twice daily.

## 2018-06-12 ENCOUNTER — Telehealth: Payer: Self-pay

## 2018-06-12 NOTE — Telephone Encounter (Signed)
If she is still symptomatic please set her up for a video visit , can be added to APP this evening with opening   Please contact office for sooner follow up if symptoms do not improve or worsen or seek emergency care

## 2018-06-12 NOTE — Telephone Encounter (Signed)
Called and spoke with pt letting her know the info stated by TP and that if she was still symptomatic we needed to schedule video visit to further address symptoms. After stating this to pt, pt stated she will wait and call tomorrow when Dr. Annamaria Boots would be back in office to see if he would be okay refilling meds for her. Nothing further needed.

## 2018-06-12 NOTE — Telephone Encounter (Signed)
Returned call to patient she says CY normally gives her 2 weeks of prednisone. She has completed the 5 day regimen that was given on 5.29 by TN. She is still coughing badly. She has 2 more day left on abx doxy. Pt is requesting 14 taper of prednisone. Please advise.  Pt called on 5/29 c/o respiratory infection. TN called in doxy 100 mg and prednisone 10mg  2 tabs x 5 days.

## 2018-06-13 ENCOUNTER — Telehealth: Payer: Self-pay

## 2018-06-13 MED ORDER — DOXYCYCLINE HYCLATE 100 MG PO TABS: ORAL_TABLET | ORAL | 0 refills | Status: DC

## 2018-06-13 MED ORDER — PREDNISONE 10 MG PO TABS: ORAL_TABLET | ORAL | 0 refills | Status: DC

## 2018-06-13 NOTE — Telephone Encounter (Signed)
Offer prednisone 10 mg, # 20, 4 X 2 DAYS, 3 X 2 DAYS, 2 X 2 DAYS, 1 X 2 DAYS           Doxycycline 100 mg, # 28, 1 twice daily  If this isn't what she thinks she needs, I will be happy to talk to her.

## 2018-06-13 NOTE — Telephone Encounter (Signed)
Called and spoke with patient.  Dr Annamaria Boots recommendations given.  Understanding stated.  Prescriptions sent to [facility] per Patient request.  Nothing further at this time.

## 2018-06-13 NOTE — Telephone Encounter (Signed)
Dr. Annamaria Boots please advise on this pt as she is upset because Tonya did not prescribed prednisone the way you normally do.    Dr Annamaria Boots,     I have tried to reach you by calling your office because I have a respiratory infection. Can you please call in a two week prescription for prednisone and Doxycycline, and tramadol.    I have no fever, I am coughing, the mucus is a greenish color when I can get it up, hard to breath. I have given your office staff this information for several days. The PA did call in 5 days of prednisone, two tablets a day, and 7 days of doxycycline two tablets a day. I am out of prednisone and will be out of Doxycycline tomorrow and as you know it always takes two weeks of medication for me. Please contact me at [phone].

## 2018-07-04 ENCOUNTER — Ambulatory Visit: Payer: Medicare Other | Admitting: Internal Medicine

## 2018-07-05 ENCOUNTER — Encounter: Payer: Self-pay | Admitting: Internal Medicine

## 2018-07-05 ENCOUNTER — Ambulatory Visit (INDEPENDENT_AMBULATORY_CARE_PROVIDER_SITE_OTHER): Payer: Medicare Other | Admitting: Internal Medicine

## 2018-07-05 ENCOUNTER — Other Ambulatory Visit: Payer: Self-pay

## 2018-07-05 DIAGNOSIS — J9611 Chronic respiratory failure with hypoxia: Secondary | ICD-10-CM

## 2018-07-05 DIAGNOSIS — J441 Chronic obstructive pulmonary disease with (acute) exacerbation: Secondary | ICD-10-CM | POA: Diagnosis not present

## 2018-07-05 MED ORDER — PERFOROMIST 20 MCG/2ML IN NEBU: 20.0000 ug | INHALATION_SOLUTION | Freq: Two times a day (BID) | RESPIRATORY_TRACT | 11 refills | Status: DC

## 2018-07-05 MED ORDER — PREDNISONE 10 MG PO TABS: ORAL_TABLET | ORAL | 0 refills | Status: DC

## 2018-07-05 NOTE — Progress Notes (Signed)
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 Hoped for improvement- discussed normal progression of COPD. Has gained weight. Some wheeze and dry cough but no acute event.  Pending R thumb surgery. Denies anginal pain, palpitation, edema.  Follows with Oncology and Ophthalmology for occular melanoma. CT chest 02/21/2018 IMPRESSION: Stable pulmonary nodules 3 mm or less in size.  1. No evidence of metastatic disease. 2. Mild hepatomegaly. 3. Left adrenal adenoma. 4. Aortic atherosclerosis (ICD10-170.0). Coronary artery calcification. 5.  Emphysema (ICD10-J43.9).  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 We gave pred and abx early June. Only felt well while on prednisone. No fever. Chronic headache unchanged.  Neb helps- using now 4x/ day. Socially isolated w/o Covid exposure.  ROS-see HPI   + = positive Constitutional:   No-   weight loss, night sweats, fevers, chills, +fatigue, lassitude. HEENT:   No-  headaches, difficulty swallowing, tooth/dental problems, sore throat,       No-  sneezing, itching, ear ache, +nasal congestion, +post nasal drip,  CV:  No-   chest pain, orthopnea, PND, swelling in lower extremities, anasarca, dizziness, palpitations Resp: + shortness of breath with exertion or at rest.                productive cough,  + non-productive cough,  No- coughing up of blood.               change in color of mucus.  + wheezing.   Skin: No-   rash or lesions. GI:  No-   heartburn, indigestion, abdominal pain, nausea, vomiting,  GU:  MS:  No-   joint pain or swelling.   Neuro-     nothing unusual Psych:  No- change in mood or affect. No depression or anxiety.  No memory loss.  OBJ- Physical Exam   General- Alert, Oriented, Affect-appropriate,  + overweight Skin- clear  Lymphadenopathy- none Head- atraumatic            Eyes- + R prosthesis            Ears- Hearing, canals-normal  Nose- clear, no-Septal dev, mucus, polyps, erosion, perforation             Throat- Mallampati IV , mucosa clear/ not red , drainage- none, tonsils- small residual Neck- flexible , trachea midline, no stridor , thyroid nl, carotid no bruit Chest - symmetrical excursion , unlabored           Heart/CV- RRR , no murmur , no gallop  , no rub, nl s1 s2                            - JVD- none , edema- none, stasis changes- none, varices- none           Lung-  Wheeze+ I&E, cough-none , dullness-none, rub- none,                                + distant breath sounds           Chest wall-  Abd-  Br/ Gen/ Rectal- Not done, not indicated Extrem- cyanosis- none, clubbing, none, atrophy- none, strength- nl Neuro- grossly intact to observation

## 2018-07-05 NOTE — Patient Instructions (Signed)
Samples or Perforomist or Brovanna neb solution if available- Try 1 by neb twice daily, instead of albuterol for now.  Script sent for prednisone 10 mg,   Take 2 daily x 2 days, then one daily for the next couple of weeks.   Please let me know if you don't start feeling better.

## 2018-07-06 ENCOUNTER — Other Ambulatory Visit: Payer: Self-pay | Admitting: Internal Medicine

## 2018-07-06 ENCOUNTER — Ambulatory Visit: Payer: Medicare Other | Admitting: Sports Medicine

## 2018-07-07 ENCOUNTER — Encounter: Payer: Self-pay | Admitting: Internal Medicine

## 2018-07-07 NOTE — Assessment & Plan Note (Signed)
Continues to need O2 for sleep and as needed during the day- 2L

## 2018-07-07 NOTE — Assessment & Plan Note (Signed)
Increased wheezing since an exacerbation that may have been related to her thumb surgery. Breath sounds described by anesthetist as clear. Consider possibility of aspiration. Not intubated.  Plan- Perforomist to allow less frequent nebs. Maintenance prednisone 10 mg daily with discussion.

## 2018-07-09 ENCOUNTER — Telehealth: Payer: Self-pay | Admitting: Internal Medicine

## 2018-07-09 NOTE — Telephone Encounter (Signed)
Called & spoke w/ pt regarding paperwork w/ OV notes she needed CY to fill out from [place] 07/05/2018. I let pt know these papers are ready to pick up, which pt verbalized understanding to. Pt states her husband, Etherine Mackowiak, will be picking this up. Will place folder up front and make foyer staff aware. Nothing further needed at this time.

## 2018-07-09 NOTE — Telephone Encounter (Signed)
Carol Quinn did you call pt for something?

## 2018-07-17 ENCOUNTER — Other Ambulatory Visit: Payer: Self-pay

## 2018-07-18 ENCOUNTER — Other Ambulatory Visit: Payer: Self-pay

## 2018-07-18 ENCOUNTER — Encounter: Payer: Self-pay | Admitting: Sports Medicine

## 2018-07-18 ENCOUNTER — Ambulatory Visit (INDEPENDENT_AMBULATORY_CARE_PROVIDER_SITE_OTHER): Payer: Medicare Other | Admitting: Sports Medicine

## 2018-07-18 VITALS — BP 105/58 | HR 72 | Temp 97.2°F | Resp 16

## 2018-07-18 DIAGNOSIS — M79675 Pain in left toe(s): Secondary | ICD-10-CM

## 2018-07-18 DIAGNOSIS — M79674 Pain in right toe(s): Secondary | ICD-10-CM | POA: Diagnosis not present

## 2018-07-18 DIAGNOSIS — B351 Tinea unguium: Secondary | ICD-10-CM

## 2018-07-18 NOTE — Progress Notes (Signed)
   Subjective:    Patient ID: Carol Quinn, female    DOB: 1955-06-10, 63 y.o.   MRN: [id number]  HPI    Review of Systems  All other systems reviewed and are negative.      Objective:   Physical Exam        Assessment & Plan:

## 2018-07-18 NOTE — Progress Notes (Signed)
Subjective: Carol Quinn is a 63 y.o. female patient seen today in office with complaint of mildly painful thickened and discolored nails right third and left second toenail. Patient is desiring treatment for nail changes; has tried OTC topicals/Medication in the past with no improvement currently using fungal nail and soaking with vinegar. Reports that nails are becoming difficult to manage because of the thickness and reports that there has been multiple layers peeling off on these toenails the only change that she has made as she has increased her activity has been wearing tennis shoes. Patient has no other pedal complaints at this time.   Review of Systems  All other systems reviewed and are negative.    Patient Active Problem List   Diagnosis Date Noted  . Overweight (BMI 25.0-29.9) 05/22/2018  . Trigger finger of right thumb 02/19/2018  . Vitamin D deficiency 10/03/2017  . Special screening for malignant neoplasms, colon   . Benign neoplasm of sigmoid colon   . Benign neoplasm of descending colon   . Hyperlipidemia 06/28/2016  . Aortic atherosclerosis ([place]) 10/26/2015  . 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 Chronic respiratory failure with hypoxia ([place]) 03/05/2015  . Balance disorder 05/17/2013  . Routine general medical examination at a health care facility 12/21/2012  . Chest pain 12/21/2012  . Meralgia paresthetica 06/14/2012  . Palpitations 02/03/2012  . COPD exacerbation ([place]) 04/08/2011  . HTN (hypertension) 04/08/2011  . Tobacco abuse 04/08/2011  . Eye dryness 08/20/2010  . IBS (irritable bowel syndrome) 08/20/2010  . Depression 05/25/2010  . Migraine with aura 05/25/2010  . Hypersomnia 10/31/2008  . STRESS INCONTINENCE 10/31/2008  . DYSPNEA 06/27/2008  . Seasonal and perennial allergic rhinitis 05/03/2008  . G E R D 05/03/2008  . COPD mixed type ([place]) 04/18/2008    Current Outpatient Medications on File Prior to Visit  Medication Sig Dispense  Refill  . albuterol (PROVENTIL) (2.5 MG/3ML) 0.083% nebulizer solution Take 3 mLs (2.5 mg total) by nebulization every 6 (six) hours as needed for wheezing or shortness of breath. 150 mL 11  . albuterol (VENTOLIN HFA) 108 (90 Base) MCG/ACT inhaler Inhale 2 puffs into the lungs every 6 (six) hours as needed for wheezing or shortness of breath. 1 Inhaler 12  . atorvastatin (LIPITOR) 20 MG tablet TAKE 1 TABLET BY MOUTH ONCE DAILY 30 tablet 6  . buPROPion (WELLBUTRIN XL) 150 MG 24 hr tablet TAKE 1 TABLET BY MOUTH DAILY 30 tablet 6  . escitalopram (LEXAPRO) 20 MG tablet TAKE 1 TABLET BY MOUTH DAILY 30 tablet 6  . estradiol (VIVELLE-DOT) 0.1 MG/24HR patch Place 1 patch onto the skin 2 (two) times a week.    . fexofenadine (ALLEGRA) 180 MG tablet Take 180 mg by mouth daily.    . formoterol (PERFOROMIST) 20 MCG/2ML nebulizer solution Take 2 mLs (20 mcg total) by nebulization 2 (two) times daily. 2 mL 11  . modafinil (PROVIGIL) 200 MG tablet Take 1 tablet (200 mg total) by mouth daily. 30 tablet 5  . montelukast (SINGULAIR) 10 MG tablet TAKE 1 TABLET BY MOUTH AT BEDTIME 30 tablet prn  . omeprazole (PRILOSEC) 40 MG capsule TAKE 1 CAPSULE BY MOUTH ONCE DAILY 30 capsule 6  . predniSONE (DELTASONE) 10 MG tablet 1 daily or as directed 50 tablet 0  . PREVIDENT 5000 BOOSTER PLUS 1.1 % PSTE     . Respiratory Therapy Supplies (FLUTTER) DEVI Use as directed 1 each 0  . SUMAtriptan (IMITREX) 100 MG tablet Take one  tablet at onset of migraine, repeat in 2 hours if needed. (Patient taking differently: Take 100 mg by mouth every 2 (two) hours as needed for migraine or headache. ) 10 tablet 6  . traMADol (ULTRAM) 50 MG tablet Take 50 mg by mouth every 8 (eight) hours as needed for moderate pain.    . traMADol (ULTRAM) 50 MG tablet Take 1 tablet (50 mg total) by mouth every 6 (six) hours as needed. 20 tablet 0  . TRELEGY ELLIPTA 100-62.5-25 MCG/INH AEPB INHALE 1 PUFF INTO THE LUNGS DAILY 60 each 11  .  valsartan-hydrochlorothiazide (DIOVAN-HCT) 160-25 MG tablet TAKE 1 TABLET BY MOUTH DAILY 30 tablet 3  . zolmitriptan (ZOMIG) 5 MG nasal solution Place 1 spray into the nose as needed for migraine.      No current facility-administered medications on file prior to visit.     Allergies  Allergen Reactions  . Clarithromycin Diarrhea, Rash and Other (See Comments)    Severe diarrhea  Rash Other reaction(s): DIARRHEA  . Hydrocodone-Homatropine Other (See Comments)    paranoia  . Hydrocodone-Homatropine Other (See Comments)    paranoia  . Penicillins Other (See Comments)    Unknown reaction, childhood allergy Has patient had a PCN reaction causing immediate rash, facial/tongue/throat swelling, SOB or lightheadedness with hypotension: Unknown Has patient had a PCN reaction causing severe rash involving mucus membranes or skin necrosis: Unknown Has patient had a PCN reaction that required hospitalization: Unknown Has patient had a PCN reaction occurring within the last 10 years: No If all of the above answers are "NO", then may proceed with Cephalosporin use.  unknown Unknown reaction, childhood allergy  . Topiramate Other (See Comments)    WEIGHT LOSS Other reaction(s): WEIGHT LOSS WEIGHT LOSS  . Codeine Itching    Objective: Physical Exam  General: Well developed, nourished, no acute distress, awake, alert and oriented x 3  Vascular: Dorsalis pedis artery 2/4 bilateral, Posterior tibial artery 2/4 bilateral, skin temperature warm to warm proximal to distal bilateral lower extremities, no varicosities, pedal hair present bilateral.  Neurological: Gross sensation present via light touch bilateral.   Dermatological: Skin is warm, dry, and supple bilateral, right third and left second toenails have mild subungal debris and are lifting with mild trauma lines present, no webspace macerations present bilateral, no open lesions present bilateral, no callus/corns/hyperkeratotic tissue  present bilateral. No signs of infection bilateral.  Musculoskeletal: No symptomatic boney deformities noted bilateral. Muscular strength within normal limits without painon range of motion. No pain with calf compression bilateral.  Assessment and Plan:  Problem List Items Addressed This Visit    None    Visit Diagnoses    Nail fungus    -  Primary   Relevant Orders   Culture, fungus without smear   Pain in toes of both feet         -Examined patient -Discussed treatment options for painful dystrophic nails  - Fungal culture was obtained by removing a portion of the hard nail itself from left second and right third toenails using a sterile nail nipper and sent to [facility] lab. Patient tolerated the biopsy procedure well without discomfort or need for anesthesia.  -Patient to return in 4 weeks for follow up evaluation and discussion of fungal culture results or sooner if symptoms worsen.  Landis Martins, DPM

## 2018-07-24 ENCOUNTER — Other Ambulatory Visit: Payer: Self-pay | Admitting: Internal Medicine

## 2018-08-02 ENCOUNTER — Other Ambulatory Visit: Payer: Self-pay | Admitting: Family Medicine

## 2018-08-09 IMAGING — CT CT ABDOMEN W/ CM
4 of 5 series · 13 of 46 positions shown, 18 images · IV contrast (ISOVUE)
Comparison: Current chest radiographs.

CLINICAL DATA: Melanoma of choroid of right eye. Evaluate for mets.
Hysterectomy. HTN. Smoker x74years.

EXAM:
CT CHEST AND ABDOMEN WITH CONTRAST
TECHNIQUE: Multidetector CT imaging of the chest and abdomen was performed
following the standard protocol during bolus administration of
intravenous contrast.
CONTRAST:  100mL Y7JSTX-0BB IOPAMIDOL (Y7JSTX-0BB) INJECTION
61%<Contrast>100mL Y7JSTX-0BB IOPAMIDOL (Y7JSTX-0BB) INJECTION 61%

[Series 2: chest/ab with st · axial · 0.73mm/px · z∈[-574,-274]mm · 7 of 86 slices shown]
[im 9/86  soft-tissue]
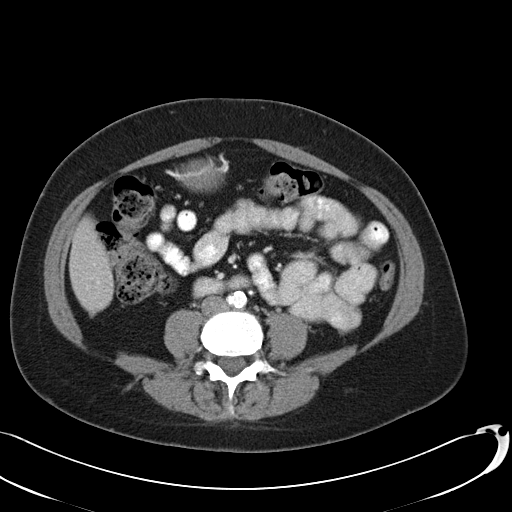
[im 18/86  soft-tissue]
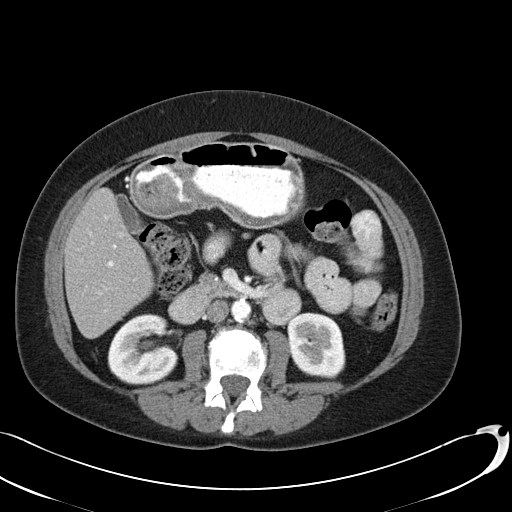
[im 26/86  soft-tissue]
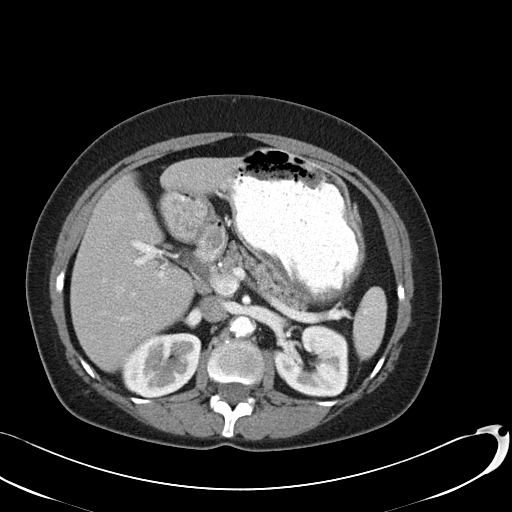
[im 35/86  soft-tissue]
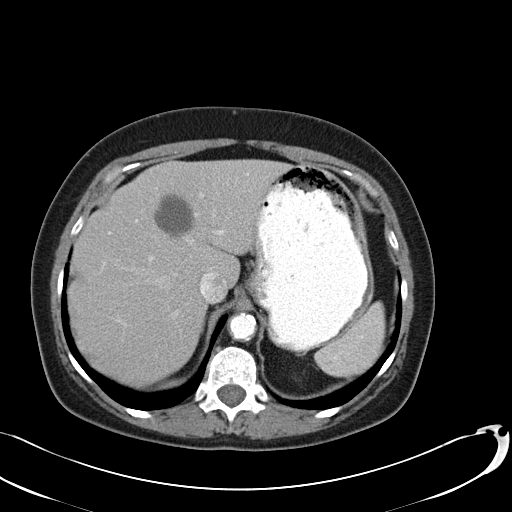
[im 52/86  soft-tissue]
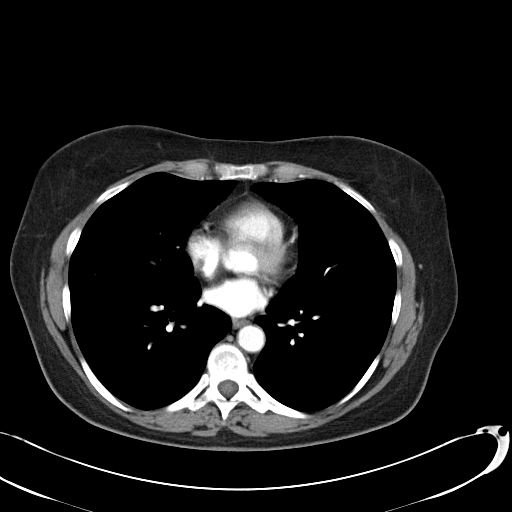
[im 60/86  soft-tissue]
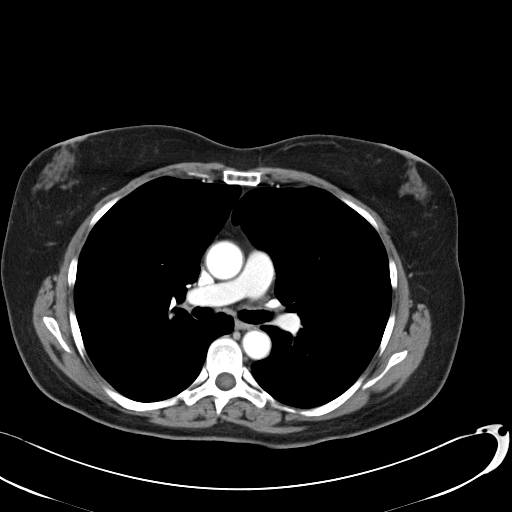
[im 69/86  soft-tissue]
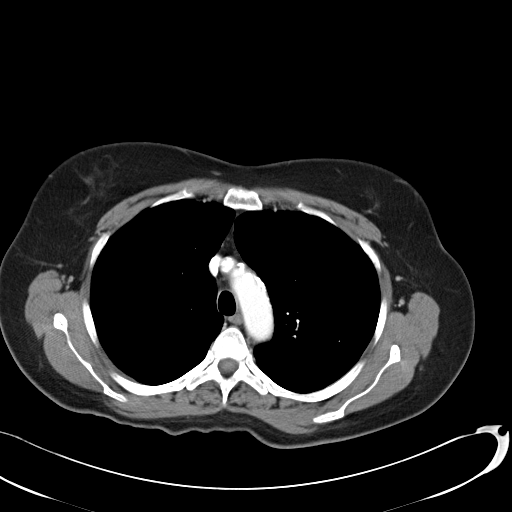

[Series 3: kidney delays · axial · 0.74mm/px · z∈[-514,-458]mm · 2 of 34 slices shown, 5 images]
[im 12/34  soft-tissue]
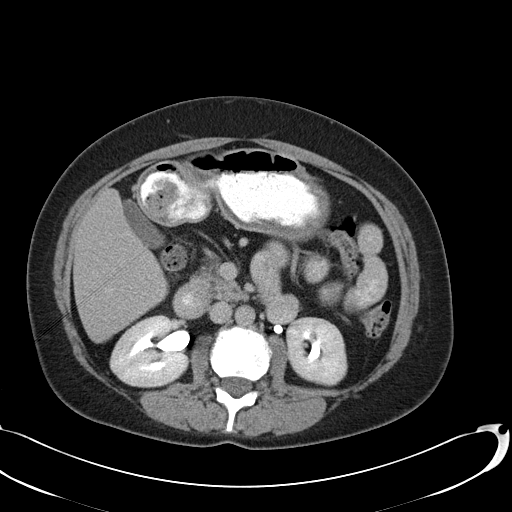
[im 12/34  lung]
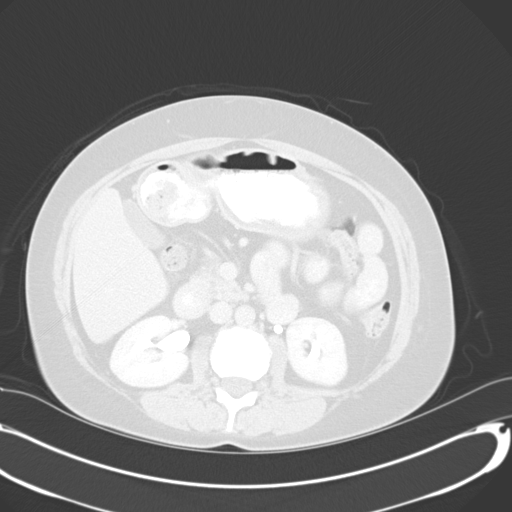
[im 12/34  bone]
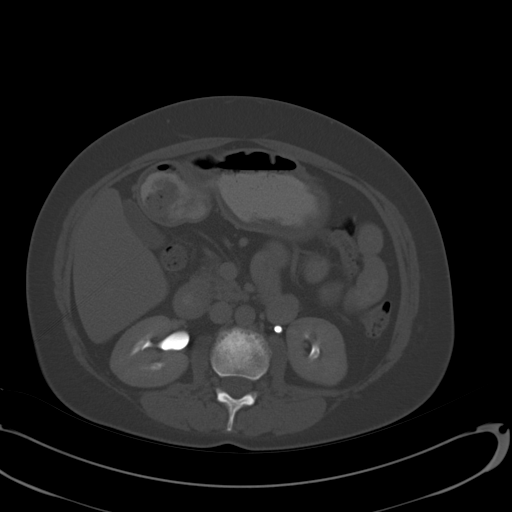
[im 23/34  soft-tissue]
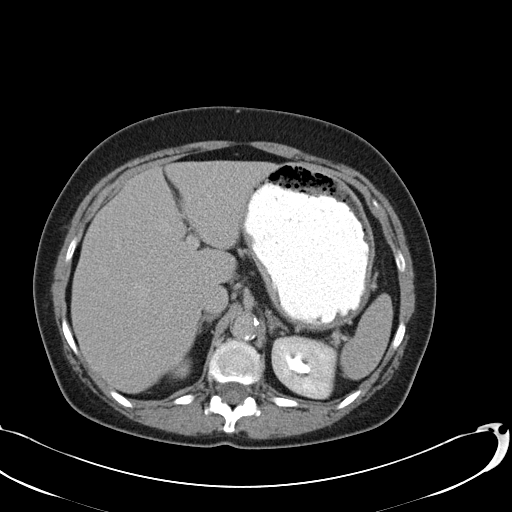
[im 23/34  lung]
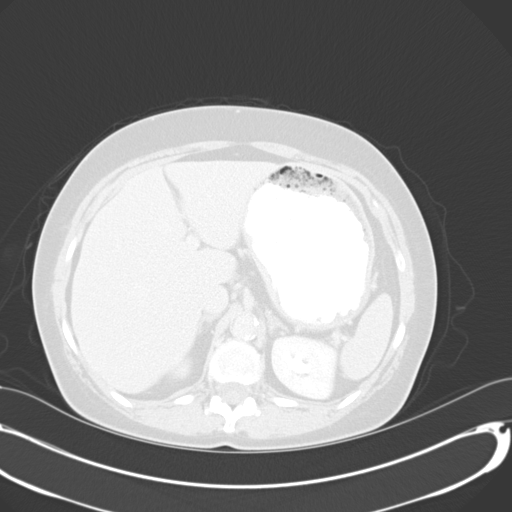

[Series 603: <mpr thick range(1)> · coronal · 0.83mm/px · 3 of 119 slices shown, 4 images]
[im 40/119  soft-tissue]
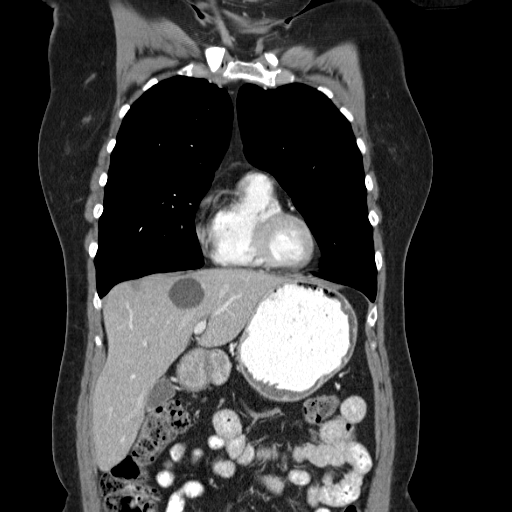
[im 53/119  soft-tissue]
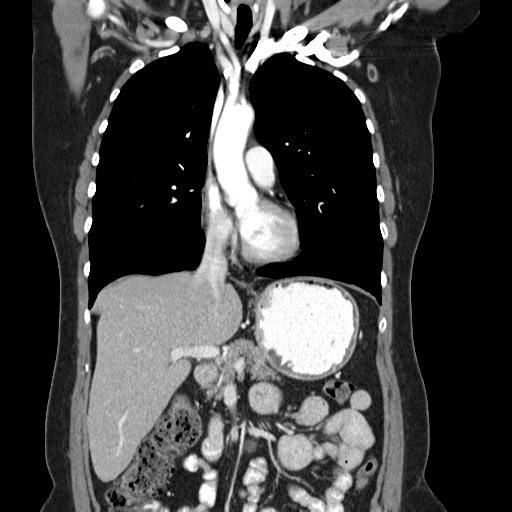
[im 53/119  bone]
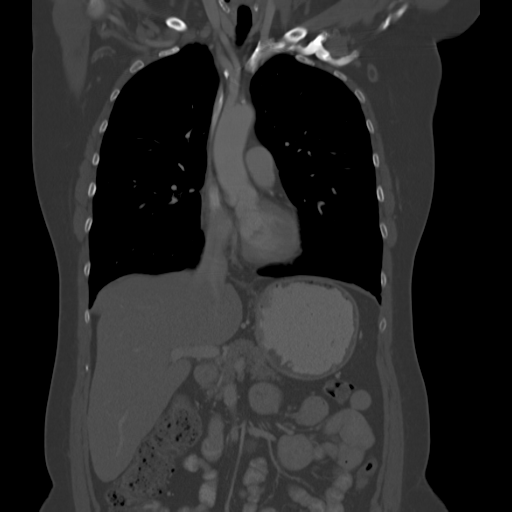
[im 66/119  soft-tissue]
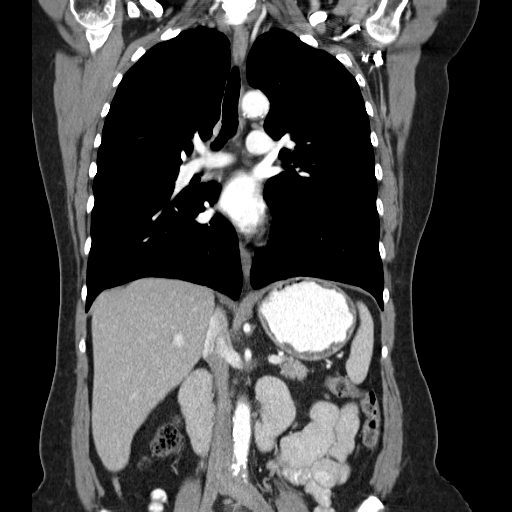

[Series 605: <mpr thick range(3)> · sagittal · 0.83mm/px · 1 of 148 slices shown, 2 images]
[im 50/148  soft-tissue]
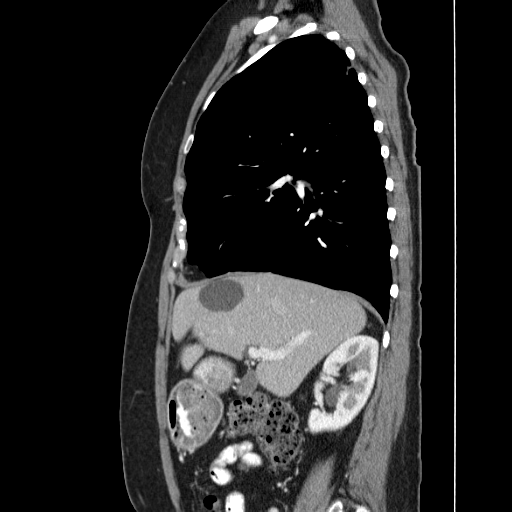
[im 50/148  bone]
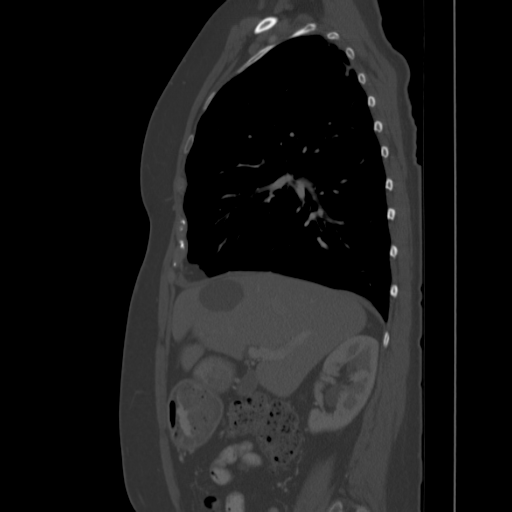

[13 of 46 positions shown; findings below may reference images not displayed]

FINDINGS: CT CHEST FINDINGS

Cardiovascular: Heart is normal in size and configuration. There are
mild coronary artery calcifications. Great vessels normal in
caliber. Mild atherosclerotic calcifications noted along the aortic
arch and arch branch vessels with no significant stenosis. Mostly
noncalcified plaque noted along the descending thoracic aorta.

Mediastinum/Nodes: Normal thyroid gland. No neck base or axillary
masses or enlarged lymph nodes. No mediastinal or hilar masses or
adenopathy. Trachea and esophagus are unremarkable.

Lungs/Pleura: No lung mass or suspicious nodule. There is apical
pleural parenchymal scarring. Advanced changes of emphysema are
noted. No evidence of pneumonia or pulmonary edema. No pleural
effusion or pneumothorax.

CT ABDOMEN FINDINGS

Hepatobiliary: 3.9 cm cyst at the dome of the left liver lobe. No
other liver mass or lesion. Normal gallbladder. No bile duct
dilation.

Pancreas: Unremarkable. No pancreatic ductal dilatation or
surrounding inflammatory changes.

Spleen: Normal in size without focal abnormality.

Adrenals/Urinary Tract: Adrenal glands, kidneys and proximal ureters
are unremarkable.

Stomach/Bowel: Stomach is unremarkable. Visualized small bowel and
colon are within normal limits. A portion of a normal-sized appendix
is visualized.

Vascular/Lymphatic: Atherosclerotic plaque is noted along the
abdominal aorta extending into the iliac arteries no adenopathy.

Other: No abdominal wall hernia or abnormality. No abdominopelvic
ascites.

MUSCULOSKELETAL FINDINGS

No osteolytic lesions. There is a focus of sclerosis in the L1
vertebra consistent with a benign bone island. There is loss of disc
height with significant endplate sclerosis and small endplate
osteophytes at the L2-L3 level. The sclerosis is felt to be
degenerative in origin.
IMPRESSION: 1. No evidence of metastatic disease within the chest or abdomen.
2. No acute findings.
3. Advanced emphysema. Apical pleural parenchymal scarring in the
lungs.
4. 3.9 cm liver cyst.
5. Significant aortic atherosclerosis most evident in the abdomen.
No aneurysm.
6. Disk degenerative changes with significant reactive endplate
sclerosis at L2-L3.

## 2018-08-15 ENCOUNTER — Encounter: Payer: Self-pay | Admitting: Sports Medicine

## 2018-08-15 ENCOUNTER — Ambulatory Visit (INDEPENDENT_AMBULATORY_CARE_PROVIDER_SITE_OTHER): Payer: Medicare Other | Admitting: Sports Medicine

## 2018-08-15 ENCOUNTER — Other Ambulatory Visit: Payer: Self-pay

## 2018-08-15 DIAGNOSIS — M79674 Pain in right toe(s): Secondary | ICD-10-CM

## 2018-08-15 DIAGNOSIS — B351 Tinea unguium: Secondary | ICD-10-CM

## 2018-08-15 DIAGNOSIS — M79675 Pain in left toe(s): Secondary | ICD-10-CM

## 2018-08-15 NOTE — Progress Notes (Signed)
Subjective: Carol Quinn is a 63 y.o. female patient seen today in office for fungal culture results. Patient has no other pedal complaints at this time.   Patient Active Problem List   Diagnosis Date Noted  . Overweight (BMI 25.0-29.9) 05/22/2018  . Trigger finger of right thumb 02/19/2018  . Vitamin D deficiency 10/03/2017  . Special screening for malignant neoplasms, colon   . Benign neoplasm of sigmoid colon   . Benign neoplasm of descending colon   . Hyperlipidemia 06/28/2016  . Aortic atherosclerosis ([place]) 10/26/2015  . 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 Chronic respiratory failure with hypoxia ([place]) 03/05/2015  . Balance disorder 05/17/2013  . Routine general medical examination at a health care facility 12/21/2012  . Chest pain 12/21/2012  . Meralgia paresthetica 06/14/2012  . Palpitations 02/03/2012  . COPD exacerbation ([place]) 04/08/2011  . HTN (hypertension) 04/08/2011  . Tobacco abuse 04/08/2011  . Eye dryness 08/20/2010  . IBS (irritable bowel syndrome) 08/20/2010  . Depression 05/25/2010  . Migraine with aura 05/25/2010  . Hypersomnia 10/31/2008  . STRESS INCONTINENCE 10/31/2008  . DYSPNEA 06/27/2008  . Seasonal and perennial allergic rhinitis 05/03/2008  . G E R D 05/03/2008  . COPD mixed type ([place]) 04/18/2008    Current Outpatient Medications on File Prior to Visit  Medication Sig Dispense Refill  . albuterol (PROVENTIL) (2.5 MG/3ML) 0.083% nebulizer solution Take 3 mLs (2.5 mg total) by nebulization every 6 (six) hours as needed for wheezing or shortness of breath. 150 mL 11  . albuterol (VENTOLIN HFA) 108 (90 Base) MCG/ACT inhaler Inhale 2 puffs into the lungs every 6 (six) hours as needed for wheezing or shortness of breath. 1 Inhaler 12  . atorvastatin (LIPITOR) 20 MG tablet TAKE 1 TABLET BY MOUTH ONCE DAILY 90 tablet 1  . buPROPion (WELLBUTRIN XL) 150 MG 24 hr tablet TAKE 1 TABLET BY MOUTH DAILY 30 tablet 6  . escitalopram (LEXAPRO) 20 MG  tablet TAKE 1 TABLET BY MOUTH DAILY 30 tablet 6  . estradiol (VIVELLE-DOT) 0.1 MG/24HR patch Place 1 patch onto the skin 2 (two) times a week.    . fexofenadine (ALLEGRA) 180 MG tablet Take 180 mg by mouth daily.    . formoterol (PERFOROMIST) 20 MCG/2ML nebulizer solution Take 2 mLs (20 mcg total) by nebulization 2 (two) times daily. 2 mL 11  . modafinil (PROVIGIL) 200 MG tablet Take 1 tablet (200 mg total) by mouth daily. 30 tablet 5  . montelukast (SINGULAIR) 10 MG tablet TAKE 1 TABLET BY MOUTH DAILY AT BEDTIME 30 tablet prn  . omeprazole (PRILOSEC) 40 MG capsule TAKE 1 CAPSULE BY MOUTH ONCE DAILY 30 capsule 6  . predniSONE (DELTASONE) 10 MG tablet 1 daily or as directed 50 tablet 0  . PREVIDENT 5000 BOOSTER PLUS 1.1 % PSTE     . Respiratory Therapy Supplies (FLUTTER) DEVI Use as directed 1 each 0  . SUMAtriptan (IMITREX) 100 MG tablet Take one tablet at onset of migraine, repeat in 2 hours if needed. (Patient taking differently: Take 100 mg by mouth every 2 (two) hours as needed for migraine or headache. ) 10 tablet 6  . traMADol (ULTRAM) 50 MG tablet Take 50 mg by mouth every 8 (eight) hours as needed for moderate pain.    . traMADol (ULTRAM) 50 MG tablet Take 1 tablet (50 mg total) by mouth every 6 (six) hours as needed. 20 tablet 0  . TRELEGY ELLIPTA 100-62.5-25 MCG/INH AEPB INHALE 1 PUFF INTO THE  LUNGS DAILY 60 each 11  . valsartan-hydrochlorothiazide (DIOVAN-HCT) 160-25 MG tablet TAKE 1 TABLET BY MOUTH DAILY 90 tablet 1  . zolmitriptan (ZOMIG) 5 MG nasal solution Place 1 spray into the nose as needed for migraine.      No current facility-administered medications on file prior to visit.     Allergies  Allergen Reactions  . Clarithromycin Diarrhea, Rash and Other (See Comments)    Severe diarrhea  Rash Other reaction(s): DIARRHEA  . Hydrocodone-Homatropine Other (See Comments)    paranoia  . Hydrocodone-Homatropine Other (See Comments)    paranoia  . Penicillins Other (See  Comments)    Unknown reaction, childhood allergy Has patient had a PCN reaction causing immediate rash, facial/tongue/throat swelling, SOB or lightheadedness with hypotension: Unknown Has patient had a PCN reaction causing severe rash involving mucus membranes or skin necrosis: Unknown Has patient had a PCN reaction that required hospitalization: Unknown Has patient had a PCN reaction occurring within the last 10 years: No If all of the above answers are "NO", then may proceed with Cephalosporin use.  unknown Unknown reaction, childhood allergy  . Topiramate Other (See Comments)    WEIGHT LOSS Other reaction(s): WEIGHT LOSS WEIGHT LOSS  . Codeine Itching    Objective: Physical Exam  General: Well developed, nourished, no acute distress, awake, alert and oriented x 3  Vascular: Dorsalis pedis artery 2/4 bilateral, Posterior tibial artery 2/4 bilateral, skin temperature warm to warm proximal to distal bilateral lower extremities, no varicosities, pedal hair present bilateral.  Neurological: Gross sensation present via light touch bilateral.   Dermatological: Skin is warm, dry, and supple bilateral, Nails 1-10 are tender, short thick, and discolored with mild subungal debris with the right third and left second toenails most involved, no webspace macerations present bilateral, no open lesions present bilateral, no callus/corns/hyperkeratotic tissue present bilateral. No signs of infection bilateral.  Musculoskeletal: No boney deformities noted bilateral. Muscular strength within normal limits without painon range of motion. No pain with calf compression bilateral.  Fungal culture consistent with fungus on microscopic exam  Assessment and Plan:  Problem List Items Addressed This Visit    None    Visit Diagnoses    Nail fungus    -  Primary   Pain in toes of both feet          -Examined patient -Discussed treatment options for painful mycotic nails -Patient opt for oral Lamisil  with full understanding of medication risks; advised patient that she should further discuss this option to take Lamisil with her oncologist to make sure there will be no adverse effects on her liver and will require blood work including getting a liver function test if this test is normal then in my opinion I feel like it is safe to proceed with her starting oral Lamisil however we will also see what her oncologist thinks as well -Patient to return as scheduled or sooner if symptoms worsen.  Landis Martins, DPM

## 2018-08-15 NOTE — Progress Notes (Signed)
Ok great to hear. Thanks so much I will send her medication after she gets bloodwork on 8-12 if her hepatic panel is normal Thanks Dr. Chauncey Cruel

## 2018-08-16 ENCOUNTER — Ambulatory Visit: Payer: Medicare Other | Admitting: Internal Medicine

## 2018-08-17 ENCOUNTER — Encounter: Payer: Self-pay | Admitting: Sports Medicine

## 2018-08-22 ENCOUNTER — Other Ambulatory Visit: Payer: Self-pay

## 2018-08-22 ENCOUNTER — Ambulatory Visit (HOSPITAL_BASED_OUTPATIENT_CLINIC_OR_DEPARTMENT_OTHER)
Admission: RE | Admit: 2018-08-22 | Discharge: 2018-08-22 | Disposition: A | Discharge status: Home / Self Care | Payer: Medicare Other | Source: Ambulatory Visit | Attending: Hematology & Oncology | Admitting: Hematology & Oncology

## 2018-08-22 ENCOUNTER — Telehealth: Payer: Self-pay | Admitting: Hematology & Oncology

## 2018-08-22 ENCOUNTER — Encounter (HOSPITAL_BASED_OUTPATIENT_CLINIC_OR_DEPARTMENT_OTHER): Payer: Self-pay

## 2018-08-22 ENCOUNTER — Encounter: Payer: Self-pay | Admitting: Sports Medicine

## 2018-08-22 ENCOUNTER — Encounter: Payer: Self-pay | Admitting: Hematology & Oncology

## 2018-08-22 ENCOUNTER — Inpatient Hospital Stay: Payer: Medicare Other

## 2018-08-22 ENCOUNTER — Inpatient Hospital Stay: Payer: Medicare Other | Attending: Hematology & Oncology | Admitting: Hematology & Oncology

## 2018-08-22 VITALS — BP 130/67 | HR 83 | Temp 97.1°F | Resp 18 | Wt 162.0 lb

## 2018-08-22 DIAGNOSIS — C6931 Malignant neoplasm of right choroid: Secondary | ICD-10-CM | POA: Diagnosis present

## 2018-08-22 DIAGNOSIS — Z79899 Other long term (current) drug therapy: Secondary | ICD-10-CM | POA: Diagnosis not present

## 2018-08-22 DIAGNOSIS — Z85840 Personal history of malignant neoplasm of eye: Secondary | ICD-10-CM | POA: Diagnosis not present

## 2018-08-22 DIAGNOSIS — J449 Chronic obstructive pulmonary disease, unspecified: Secondary | ICD-10-CM | POA: Diagnosis not present

## 2018-08-22 LAB — CMP (CANCER CENTER ONLY)
ALT: 26 U/L (ref 0–44)
AST: 19 U/L (ref 15–41)
Albumin: 4 g/dL (ref 3.5–5.0)
Alkaline Phosphatase: 76 U/L (ref 38–126)
Anion gap: 10 (ref 5–15)
BUN: 15 mg/dL (ref 8–23)
CO2: 32 mmol/L (ref 22–32)
Calcium: 8.8 mg/dL — ABNORMAL LOW (ref 8.9–10.3)
Chloride: 100 mmol/L (ref 98–111)
Creatinine: 0.63 mg/dL (ref 0.44–1.00)
GFR, Est AFR Am: >60 mL/min (ref >60)
GFR, Estimated: >60 mL/min (ref >60)
Glucose, Bld: 98 mg/dL (ref 70–99)
Potassium: 4 mmol/L (ref 3.5–5.1)
Sodium: 142 mmol/L (ref 135–145)
Total Bilirubin: 0.3 mg/dL (ref 0.3–1.2)
Total Protein: 6.1 g/dL — ABNORMAL LOW (ref 6.5–8.1)

## 2018-08-22 LAB — CBC WITH DIFFERENTIAL (CANCER CENTER ONLY)
Abs Immature Granulocytes: 0.05 10*3/uL (ref 0.00–0.07)
Basophils Absolute: 0.1 10*3/uL (ref 0.0–0.1)
Basophils Relative: 1 %
Eosinophils Absolute: 0.3 10*3/uL (ref 0.0–0.5)
Eosinophils Relative: 3 %
HCT: 42.7 % (ref 36.0–46.0)
Hemoglobin: 13.9 g/dL (ref 12.0–15.0)
Immature Granulocytes: 1 %
Lymphocytes Relative: 24 %
Lymphs Abs: 2.4 10*3/uL (ref 0.7–4.0)
MCH: 32.6 pg (ref 26.0–34.0)
MCHC: 32.6 g/dL (ref 30.0–36.0)
MCV: 100.2 fL — ABNORMAL HIGH (ref 80.0–100.0)
Monocytes Absolute: 1 10*3/uL (ref 0.1–1.0)
Monocytes Relative: 10 %
Neutro Abs: 6.1 10*3/uL (ref 1.7–7.7)
Neutrophils Relative %: 61 %
Platelet Count: 287 10*3/uL (ref 150–400)
RBC: 4.26 MIL/uL (ref 3.87–5.11)
RDW: 12.7 % (ref 11.5–15.5)
WBC Count: 10 10*3/uL (ref 4.0–10.5)
nRBC: 0 % (ref 0.0–0.2)

## 2018-08-22 LAB — LACTATE DEHYDROGENASE: LDH: 170 U/L (ref 98–192)

## 2018-08-22 MED ORDER — IOHEXOL 300 MG/ML  SOLN
100.0000 mL | Freq: Once | INTRAMUSCULAR | Status: AC | PRN
  Administered 2018-08-22: 100 mL via INTRAVENOUS

## 2018-08-22 NOTE — Progress Notes (Signed)
Hematology and Oncology Follow Up Visit  Carol Quinn [id number] Apr 06, 1955 63 y.o. 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  He is here every 6 months.  So far, Carol Quinn has been doing quite well.  Carol Quinn is managing the coronavirus.  The big news is that Carol Quinn bought an inflatable hot tub.  I had no clue that you could do this.  Carol Quinn uses all the time.  It really has helped Carol Quinn quality of life.  Carol Quinn had a CT scan done today.  CT scan did not show any evidence of recurrent ocular melanoma.  Carol Quinn has had no problems with Carol Quinn right ocular prosthetic.  Carol Quinn has had problems with Carol Quinn lungs.  Carol Quinn is on prednisone.  Carol Quinn stopped smoking.  Carol Quinn lungs do have problems with chronic bronchitis.  Carol Quinn pulmonologist is been very aggressive to try to help with Carol Quinn breathing.  Because of the prednisone, Carol Quinn is gaining more weight.  Carol Quinn has had no change in bowel or bladder habits.  Carol Quinn has had no issues with nausea or vomiting.  Carol Quinn has had no leg swelling.  Overall, Carol Quinn performance status is ECOG 1.   Medications:  Allergies as of 08/22/2018      Reactions   Clarithromycin Diarrhea, Rash, Other (See Comments)   Severe diarrhea  Rash Other reaction(s): DIARRHEA   Hydrocodone-homatropine Other (See Comments)   paranoia   Hydrocodone-homatropine Other (See Comments)   paranoia   Penicillins Other (See Comments)   Unknown reaction, childhood allergy Has patient had a PCN reaction causing immediate rash, facial/tongue/throat swelling, SOB or lightheadedness with hypotension: Unknown Has patient had a PCN reaction causing severe rash involving mucus membranes or skin necrosis: Unknown Has patient had a PCN reaction that required hospitalization: Unknown Has patient had a PCN reaction occurring within the last  10 years: No If all of the above answers are "NO", then may proceed with Cephalosporin use. unknown Unknown reaction, childhood allergy   Topiramate Other (See Comments)   WEIGHT LOSS Other reaction(s): WEIGHT LOSS WEIGHT LOSS   Codeine Itching      Medication List       Accurate as of August 22, 2018 10:20 AM. If you have any questions, ask your nurse or doctor.        albuterol (2.5 MG/3ML) 0.083% nebulizer solution Commonly known as: PROVENTIL Take 3 mLs (2.5 mg total) by nebulization every 6 (six) hours as needed for wheezing or shortness of breath.   albuterol 108 (90 Base) MCG/ACT inhaler Commonly known as: VENTOLIN HFA Inhale 2 puffs into the lungs every 6 (six) hours as needed for wheezing or shortness of breath.   atorvastatin 20 MG tablet Commonly known as: LIPITOR TAKE 1 TABLET BY MOUTH ONCE DAILY   buPROPion 150 MG 24 hr tablet Commonly known as: WELLBUTRIN XL TAKE 1 TABLET BY MOUTH DAILY   erythromycin ophthalmic ointment Place into the right eye nightly.   escitalopram 20 MG tablet Commonly known as: LEXAPRO TAKE 1 TABLET BY MOUTH DAILY   estradiol 0.1 MG/24HR patch Commonly known as: VIVELLE-DOT Place 1 patch onto the skin 2 (two) times a week.   fexofenadine 180 MG tablet Commonly known as: ALLEGRA Take 180 mg by mouth daily.   Flutter Devi Use as directed   modafinil 200 MG tablet Commonly  known as: PROVIGIL Take 1 tablet (200 mg total) by mouth daily.   montelukast 10 MG tablet Commonly known as: SINGULAIR TAKE 1 TABLET BY MOUTH DAILY AT BEDTIME   omeprazole 40 MG capsule Commonly known as: PRILOSEC TAKE 1 CAPSULE BY MOUTH ONCE DAILY   Perforomist 20 MCG/2ML nebulizer solution Generic drug: formoterol Take 2 mLs (20 mcg total) by nebulization 2 (two) times daily.   predniSONE 10 MG tablet Commonly known as: DELTASONE 1 daily or as directed   PreviDent 5000 Booster Plus 1.1 % Pste Generic drug: Sodium Fluoride   SUMAtriptan  100 MG tablet Commonly known as: IMITREX Take one tablet at onset of migraine, repeat in 2 hours if needed. What changed:   how much to take  how to take this  when to take this  reasons to take this  additional instructions   traMADol 50 MG tablet Commonly known as: ULTRAM Take 50 mg by mouth every 8 (eight) hours as needed for moderate pain.   traMADol 50 MG tablet Commonly known as: Ultram Take 1 tablet (50 mg total) by mouth every 6 (six) hours as needed.   Trelegy Ellipta 100-62.5-25 MCG/INH Aepb Generic drug: Fluticasone-Umeclidin-Vilant INHALE 1 PUFF INTO THE LUNGS DAILY   valsartan-hydrochlorothiazide 160-25 MG tablet Commonly known as: DIOVAN-HCT TAKE 1 TABLET BY MOUTH DAILY   zolmitriptan 5 MG nasal solution Commonly known as: ZOMIG Place 1 spray into the nose as needed for migraine.       Allergies:  Allergies  Allergen Reactions  . Clarithromycin Diarrhea, Rash and Other (See Comments)    Severe diarrhea  Rash Other reaction(s): DIARRHEA  . Hydrocodone-Homatropine Other (See Comments)    paranoia  . Hydrocodone-Homatropine Other (See Comments)    paranoia  . Penicillins Other (See Comments)    Unknown reaction, childhood allergy Has patient had a PCN reaction causing immediate rash, facial/tongue/throat swelling, SOB or lightheadedness with hypotension: Unknown Has patient had a PCN reaction causing severe rash involving mucus membranes or skin necrosis: Unknown Has patient had a PCN reaction that required hospitalization: Unknown Has patient had a PCN reaction occurring within the last 10 years: No If all of the above answers are "NO", then may proceed with Cephalosporin use.  unknown Unknown reaction, childhood allergy  . Topiramate Other (See Comments)    WEIGHT LOSS Other reaction(s): WEIGHT LOSS WEIGHT LOSS  . Codeine Itching    Past Medical History, Surgical history, Social history, and Family History were reviewed and updated.   Review of Systems: Review of Systems  Constitutional: Negative.   HENT: Negative.   Eyes: Negative.   Respiratory: Negative.   Cardiovascular: Negative.   Gastrointestinal: Positive for abdominal pain.  Genitourinary: Negative.   Musculoskeletal: Negative.   Skin: Negative.   Neurological: Negative.   Endo/Heme/Allergies: Negative.   Psychiatric/Behavioral: Negative.       Physical Exam:  weight is 162 lb (73.5 kg). Carol Quinn temporal temperature is 97.1 F (36.2 C) (abnormal). Carol Quinn blood pressure is 130/67 and Carol Quinn pulse is 83. Carol Quinn respiration is 18 and oxygen saturation is 98%.   Wt Readings from Last 3 Encounters:  08/22/18 162 lb (73.5 kg)  07/05/18 159 lb 3.2 oz (72.2 kg)  06/05/18 160 lb 0.9 oz (72.6 kg)    Physical Exam Vitals signs reviewed.  HENT:     Head: Normocephalic and atraumatic.  Eyes:     Pupils: Pupils are equal, round, and reactive to light.     Comments: Carol Quinn has the ocular prosthetic  in the right eye.  Left eye is unremarkable.  Carol Quinn has good extraocular muscle movement of the left eye.  Neck:     Musculoskeletal: Normal range of motion.  Cardiovascular:     Rate and Rhythm: Normal rate and regular rhythm.     Heart sounds: Normal heart sounds.  Pulmonary:     Effort: Pulmonary effort is normal.     Breath sounds: Normal breath sounds.  Abdominal:     General: Bowel sounds are normal.     Palpations: Abdomen is soft.  Musculoskeletal: Normal range of motion.        General: No tenderness or deformity.  Lymphadenopathy:     Cervical: No cervical adenopathy.  Skin:    General: Skin is warm and dry.     Findings: No erythema or rash.  Neurological:     Mental Status: Carol Quinn is alert and oriented to person, place, and time.  Psychiatric:        Behavior: Behavior normal.        Thought Content: Thought content normal.        Judgment: Judgment normal.      Lab Results  Component Value Date   WBC 10.0 08/22/2018   HGB 13.9 08/22/2018   HCT 42.7  08/22/2018   MCV 100.2 (H) 08/22/2018   PLT 287 08/22/2018   No results found for: FERRITIN, IRON, TIBC, UIBC, IRONPCTSAT Lab Results  Component Value Date   RBC 4.26 08/22/2018   No results found for: KPAFRELGTCHN, LAMBDASER, KAPLAMBRATIO No results found for: IGGSERUM, IGA, IGMSERUM No results found for: Odetta Pink, SPEI   Chemistry      Component Value Date/Time   NA 142 08/22/2018 0816   NA 143 12/23/2016 0848   K 4.0 08/22/2018 0816   K 4.3 12/23/2016 0848   CL 100 08/22/2018 0816   CL 99 12/23/2016 0848   CO2 32 08/22/2018 0816   CO2 31 12/23/2016 0848   BUN 15 08/22/2018 0816   BUN 10 12/23/2016 0848   CREATININE 0.63 08/22/2018 0816   CREATININE 1.0 12/23/2016 0848      Component Value Date/Time   CALCIUM 8.8 (L) 08/22/2018 0816   CALCIUM 9.4 12/23/2016 0848   ALKPHOS 76 08/22/2018 0816   ALKPHOS 99 (H) 12/23/2016 0848   AST 19 08/22/2018 0816   ALT 26 08/22/2018 0816   ALT 26 12/23/2016 0848   BILITOT 0.3 08/22/2018 0816      Impression and Plan: Ms. Gheen is a very pleasant 63 yo caucasian female with a choroidal melanoma of the right eye with right eye enucleation in October 2017. So far Carol Quinn has done well and CT scans today showed no evidence of metastatic disease of the chest, abdomen or pelvis.   We will plan for Carol Quinn next CT scan to be done in 6 months.  I think this would be reasonable to do.  I will see Carol Quinn back in 6 months.    [name], MD 8/12/202010:20 AM

## 2018-08-22 NOTE — Telephone Encounter (Signed)
Called and advised patient of ollow up appointments for 2021.  I also explained that once CT was authorized CT would be scheduled around her next appt date/time.  She also already has contrast for CT per 8/12 los

## 2018-08-29 ENCOUNTER — Other Ambulatory Visit: Payer: Self-pay | Admitting: Sports Medicine

## 2018-08-29 ENCOUNTER — Telehealth: Payer: Self-pay | Admitting: *Deleted

## 2018-08-29 MED ORDER — TERBINAFINE HCL 250 MG PO TABS: 250.0000 mg | ORAL_TABLET | Freq: Every day | ORAL | 0 refills | Status: DC

## 2018-08-29 MED ORDER — PREDNISONE 10 MG PO TABS: ORAL_TABLET | ORAL | 0 refills | Status: DC

## 2018-08-29 NOTE — Progress Notes (Signed)
Liver panel is normal. Let patient know that I spoke with her oncologist and he ok'd the medication of Lamisil for her to take for fungus as well. I have sent the Rx to her pharmacy. Patient to make a follow up appointment for me to check her in 6 weeks. -Dr. Cannon Kettle

## 2018-08-29 NOTE — Telephone Encounter (Signed)
Patient called to follow up on blood work results and if she can start taking the medication.  Please advise.

## 2018-08-30 NOTE — Progress Notes (Signed)
Patient notified and appt scheduled.

## 2018-08-31 ENCOUNTER — Other Ambulatory Visit: Payer: Self-pay | Admitting: Family Medicine

## 2018-10-12 ENCOUNTER — Telehealth (INDEPENDENT_AMBULATORY_CARE_PROVIDER_SITE_OTHER): Payer: Medicare Other | Admitting: Sports Medicine

## 2018-10-12 ENCOUNTER — Other Ambulatory Visit: Payer: Self-pay

## 2018-10-12 DIAGNOSIS — B351 Tinea unguium: Secondary | ICD-10-CM

## 2018-10-12 DIAGNOSIS — M79675 Pain in left toe(s): Secondary | ICD-10-CM

## 2018-10-12 DIAGNOSIS — M79674 Pain in right toe(s): Secondary | ICD-10-CM

## 2018-10-12 NOTE — Progress Notes (Signed)
Virtual Visit via Telephone Note  I connected with [name] on 10/12/18 at 11:15 AM EDT by telephone and verified that I am speaking with the correct person using two identifiers.  Location: Patient: Carol Quinn. Urda Provider: Landis Martins D.P.M.   I discussed the limitations, risks, security and privacy concerns of performing an evaluation and management service by telephone and the availability of in person appointments. I also discussed with the patient that there may be a patient responsible charge related to this service. The patient expressed understanding and agreed to proceed.   History of Present Illness: Telephone visit provided for patient regarding medication check currently on oral Lamisil.  Patient started Lamisil August 29, 2018 and has been taking 1 tablet/day.  Patient reports that there is no changes that she has noted yet in her toenails but has been compliant with taking the medication as instructed.  Patient also had blood work done by her oncologist.  Patient denies nausea vomiting fever chills or any other constitutional symptoms at this time.  No drug to drug interaction is noted.   Observations/Objective: Physical exam limited since this is entirely a telephone visit.  Subjectively patient denies any redness warmth swelling to the toes or any signs of irritation to the areas that are being treated with oral Lamisil.  Assessment and Plan: Problem List Items Addressed This Visit    None    Visit Diagnoses    Nail fungus    -  Primary   Pain in toes of both feet           Follow Up Instructions: Patient advised to continue with Lamisil 1 tablet daily and to monitor for any type of symptoms or side effects Patient advised at her follow-up with her PCP to have a copy of her blood work sent to our office for review Encourage patient to practice good hygiene habits to prevent reinfection Patient may continue with tea tree oil to her nails as needed Patient to  return to office after blood work or sooner if problems or issues arise   I discussed the assessment and treatment plan with the patient. The patient was provided an opportunity to ask questions and all were answered. The patient agreed with the plan and demonstrated an understanding of the instructions.   The patient was advised to call back or seek an in-person evaluation if the symptoms worsen or if the condition fails to improve as anticipated.  I provided 16 minutes of non-face-to-face time during this encounter.   Landis Martins, DPM

## 2018-10-12 NOTE — Patient Instructions (Signed)
Get PCP to send me blood work results at her next physical exam

## 2018-10-30 ENCOUNTER — Other Ambulatory Visit: Payer: Self-pay

## 2018-10-30 ENCOUNTER — Other Ambulatory Visit: Payer: Self-pay | Admitting: Family Medicine

## 2018-10-30 ENCOUNTER — Ambulatory Visit (INDEPENDENT_AMBULATORY_CARE_PROVIDER_SITE_OTHER): Payer: Medicare Other | Admitting: Internal Medicine

## 2018-10-30 ENCOUNTER — Encounter: Payer: Self-pay | Admitting: Internal Medicine

## 2018-10-30 DIAGNOSIS — J9611 Chronic respiratory failure with hypoxia: Secondary | ICD-10-CM | POA: Diagnosis not present

## 2018-10-30 DIAGNOSIS — J449 Chronic obstructive pulmonary disease, unspecified: Secondary | ICD-10-CM

## 2018-10-30 DIAGNOSIS — C6931 Malignant neoplasm of right choroid: Secondary | ICD-10-CM | POA: Diagnosis not present

## 2018-10-30 MED ORDER — BREZTRI AEROSPHERE 160-9-4.8 MCG/ACT IN AERO: 2.0000 | INHALATION_SPRAY | Freq: Two times a day (BID) | RESPIRATORY_TRACT | 0 refills | Status: DC

## 2018-10-30 MED ORDER — PREDNISONE 10 MG PO TABS: ORAL_TABLET | ORAL | 5 refills | Status: DC

## 2018-10-30 NOTE — Progress Notes (Signed)
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 We gave pred and abx early June. Only felt well while on prednisone. No fever. Chronic headache unchanged.  Neb helps- using now 4x/ day. Socially isolated w/o Covid exposure.  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 We discussed steroid side effects and goal of using lowest dose necessary. Had flu vax. Discussed Covid guidelines, with questions about outdoor exposures. CT chest 08/22/2018-  IMPRESSION: 1. No findings of active malignancy in the chest, abdomen, or pelvis. 2. A left inguinal lymph node at 1.0 cm in short axis is minimally enlarged from prior at 0.8 cm. Although not overtly pathologically enlarged for this location, surveillance is likely warranted. 3. Other imaging findings of potential clinical significance: Aortic Atherosclerosis (ICD10-I70.0). Emphysema (ICD10-J43.9). Small type 1 hiatal hernia. Collapsing cyst in the liver. Sigmoid colon diverticulosis. Small left adrenal adenoma.  ROS-see HPI   + = positive Constitutional:   No-   weight loss, night sweats, fevers, chills, +fatigue, lassitude. HEENT:   No-  headaches, difficulty swallowing, tooth/dental problems, sore throat,       No-  sneezing, itching, ear ache, +nasal congestion, +post nasal drip,  CV:  No-   chest pain, orthopnea, PND, swelling in lower extremities, anasarca, dizziness, palpitations Resp: + shortness of breath with exertion or at rest.                productive cough,  + non-productive cough,  No- coughing up of blood.               change in color of mucus.  + wheezing.   Skin: No-   rash or lesions. GI:  No-   heartburn, indigestion, abdominal pain, nausea, vomiting,  GU:  MS:  No-   joint pain or swelling.   Neuro-     nothing unusual Psych:  No- change in mood or affect. No depression or anxiety.  No memory loss.  OBJ- Physical Exam   General- Alert, Oriented, Affect-appropriate,  + overweight Skin-  clear  Lymphadenopathy- none Head- atraumatic            Eyes- + R prosthesis            Ears- Hearing, canals-normal            Nose- clear, no-Septal dev, mucus, polyps,  erosion, perforation             Throat- Mallampati IV , mucosa clear/ not red , drainage- none, tonsils- small residual Neck- flexible , trachea midline, no stridor , thyroid nl, carotid no bruit Chest - symmetrical excursion , unlabored           Heart/CV- RRR , no murmur , no gallop  , no rub, nl s1 s2                           - JVD- none , edema- none, stasis changes- none, varices- none           Lung-  Wheeze- none, cough-none , dullness-none, rub- none,                                + distant breath sounds           Chest wall-  Abd-  Br/ Gen/ Rectal- Not done, not indicated Extrem- cyanosis- none, clubbing, none, atrophy- none, strength- nl Neuro- grossly intact to observation

## 2018-10-30 NOTE — Assessment & Plan Note (Signed)
Continues to need O2 for sleep and has POC when needed with activity.

## 2018-10-30 NOTE — Progress Notes (Signed)
Patient seen in the office today and instructed on use of Breztri.  Patient expressed understanding and demonstrated technique.  

## 2018-10-30 NOTE — Assessment & Plan Note (Signed)
Stable with Trelegy. Not fond of taste. We discussed Breztri alternative, recognizing insurance coverage may not be good yet.  Likes prednisone. Discussed side effects and adrenal suppression. Plan- sample trial Breztri instead of Trelegy, to cocmpare. Try reducing prednisone to 10 mg alt w 5 mg qod.

## 2018-10-30 NOTE — Assessment & Plan Note (Signed)
Followed by Oncology, now with scans q 6 months.

## 2018-10-30 NOTE — Patient Instructions (Signed)
Prednisone refill sent.  Try alternating 1 tab( 10 mg) WITH 1/2 TAB (5 MG) every other day   Continue  Oxygen as you are doing.  Sample Breztri    Inhale 2 puffs, then rinse mouth, twice daily.  Try this instead of Trelegy, for comparison. When the sample runs out, go back to Trelegy or let us know if you prefer Breztri.  Please call if we can help.

## 2018-11-02 ENCOUNTER — Telehealth: Payer: Self-pay | Admitting: Internal Medicine

## 2018-11-02 NOTE — Telephone Encounter (Signed)
Per patient's chart prednisone was sent to the pharmacy at the 10.20.2020.  Called [facility] and spoke with Scarlette who reported that patient has already picked up her prednisone on 10.20.2020 @ 1346.  Called spoke with patient who looked through her medications while on the phone with me and cannot find her prednisone.  Informed patient not to worry, will call [facility] for early refill.  Called [facility] and Rx will be ready for pt.  Patient is aware.  Nothing further needed; will sign off.

## 2018-11-19 ENCOUNTER — Other Ambulatory Visit: Payer: Self-pay

## 2018-11-19 ENCOUNTER — Ambulatory Visit (INDEPENDENT_AMBULATORY_CARE_PROVIDER_SITE_OTHER): Payer: Medicare Other | Admitting: Family Medicine

## 2018-11-19 ENCOUNTER — Encounter: Payer: Self-pay | Admitting: Family Medicine

## 2018-11-19 VITALS — BP 130/82 | HR 74 | Temp 97.9°F | Resp 16 | Ht 62.0 in | Wt 159.1 lb

## 2018-11-19 DIAGNOSIS — J449 Chronic obstructive pulmonary disease, unspecified: Secondary | ICD-10-CM

## 2018-11-19 DIAGNOSIS — E785 Hyperlipidemia, unspecified: Secondary | ICD-10-CM

## 2018-11-19 DIAGNOSIS — E559 Vitamin D deficiency, unspecified: Secondary | ICD-10-CM | POA: Diagnosis not present

## 2018-11-19 DIAGNOSIS — C6931 Malignant neoplasm of right choroid: Secondary | ICD-10-CM

## 2018-11-19 DIAGNOSIS — Z0000 Encounter for general adult medical examination without abnormal findings: Secondary | ICD-10-CM | POA: Diagnosis not present

## 2018-11-19 DIAGNOSIS — I10 Essential (primary) hypertension: Secondary | ICD-10-CM | POA: Diagnosis not present

## 2018-11-19 DIAGNOSIS — E663 Overweight: Secondary | ICD-10-CM

## 2018-11-19 LAB — CBC WITH DIFFERENTIAL/PLATELET
Basophils Absolute: 0.2 10*3/uL — ABNORMAL HIGH (ref 0.0–0.1)
Basophils Relative: 2.3 % (ref 0.0–3.0)
Eosinophils Absolute: 0.2 10*3/uL (ref 0.0–0.7)
Eosinophils Relative: 2.6 % (ref 0.0–5.0)
HCT: 40.8 % (ref 36.0–46.0)
Hemoglobin: 13.7 g/dL (ref 12.0–15.0)
Lymphocytes Relative: 23.6 % (ref 12.0–46.0)
Lymphs Abs: 1.8 10*3/uL (ref 0.7–4.0)
MCHC: 33.6 g/dL (ref 30.0–36.0)
MCV: 97.3 fl (ref 78.0–100.0)
Monocytes Absolute: 0.6 10*3/uL (ref 0.1–1.0)
Monocytes Relative: 8 % (ref 3.0–12.0)
Neutro Abs: 4.7 10*3/uL (ref 1.4–7.7)
Neutrophils Relative %: 63.5 % (ref 43.0–77.0)
Platelets: 298 10*3/uL (ref 150.0–400.0)
RBC: 4.19 Mil/uL (ref 3.87–5.11)
RDW: 12.8 % (ref 11.5–15.5)
WBC: 7.5 10*3/uL (ref 4.0–10.5)

## 2018-11-19 LAB — LIPID PANEL
Cholesterol: 174 mg/dL (ref 0–200)
HDL: 75.3 mg/dL (ref >39.00)
LDL Cholesterol: 78 mg/dL (ref 0–99)
NonHDL: 99
Total CHOL/HDL Ratio: 2
Triglycerides: 103 mg/dL (ref 0.0–149.0)
VLDL: 20.6 mg/dL (ref 0.0–40.0)

## 2018-11-19 LAB — HEPATIC FUNCTION PANEL
ALT: 20 U/L (ref 0–35)
AST: 14 U/L (ref 0–37)
Albumin: 4.2 g/dL (ref 3.5–5.2)
Alkaline Phosphatase: 79 U/L (ref 39–117)
Bilirubin, Direct: 0.1 mg/dL (ref 0.0–0.3)
Total Bilirubin: 0.4 mg/dL (ref 0.2–1.2)
Total Protein: 6.1 g/dL (ref 6.0–8.3)

## 2018-11-19 LAB — BASIC METABOLIC PANEL
BUN: 20 mg/dL (ref 6–23)
CO2: 32 mEq/L (ref 19–32)
Calcium: 9.1 mg/dL (ref 8.4–10.5)
Chloride: 99 mEq/L (ref 96–112)
Creatinine, Ser: 0.64 mg/dL (ref 0.40–1.20)
GFR: 94.06 mL/min (ref >60.00)
Glucose, Bld: 90 mg/dL (ref 70–99)
Potassium: 3.8 mEq/L (ref 3.5–5.1)
Sodium: 140 mEq/L (ref 135–145)

## 2018-11-19 LAB — VITAMIN D 25 HYDROXY (VIT D DEFICIENCY, FRACTURES): VITD: 30.87 ng/mL (ref 30.00–100.00)

## 2018-11-19 LAB — TSH: TSH: 2.94 u[IU]/mL (ref 0.35–4.50)

## 2018-11-19 NOTE — Assessment & Plan Note (Signed)
Pt is following w/ Dr Marin Olp and just had negative CT scans in August.  Will continue to follow along.

## 2018-11-19 NOTE — Patient Instructions (Addendum)
Follow up in 6 months to recheck BP and cholesterol We'll notify you of your lab results and make any changes if needed Call and schedule your mammogram at your convenience Consider starting [employer] to help w/ weight loss Call with any questions or concerns Stay Safe!  Stay Healthy!   Preventive Care 62-62 Years Old, Female Preventive care refers to visits with your health care provider and lifestyle choices that can promote health and wellness. This includes:  A yearly physical exam. This may also be called an annual well check.  Regular dental visits and eye exams.  Immunizations.  Screening for certain conditions.  Healthy lifestyle choices, such as eating a healthy diet, getting regular exercise, not using drugs or products that contain nicotine and tobacco, and limiting alcohol use. What can I expect for my preventive care visit? Physical exam Your health care provider will check your:  Height and weight. This may be used to calculate body mass index (BMI), which tells if you are at a healthy weight.  Heart rate and blood pressure.  Skin for abnormal spots. Counseling Your health care provider may ask you questions about your:  Alcohol, tobacco, and drug use.  Emotional well-being.  Home and relationship well-being.  Sexual activity.  Eating habits.  Work and work Statistician.  Method of birth control.  Menstrual cycle.  Pregnancy history. What immunizations do I need?  Influenza (flu) vaccine  This is recommended every year. Tetanus, diphtheria, and pertussis (Tdap) vaccine  You may need a Td booster every 10 years. Varicella (chickenpox) vaccine  You may need this if you have not been vaccinated. Zoster (shingles) vaccine  You may need this after age 6. Measles, mumps, and rubella (MMR) vaccine  You may need at least one dose of MMR if you were born in 1957 or later. You may also need a second dose. Pneumococcal conjugate (PCV13)  vaccine  You may need this if you have certain conditions and were not previously vaccinated. Pneumococcal polysaccharide (PPSV23) vaccine  You may need one or two doses if you smoke cigarettes or if you have certain conditions. Meningococcal conjugate (MenACWY) vaccine  You may need this if you have certain conditions. Hepatitis A vaccine  You may need this if you have certain conditions or if you travel or work in places where you may be exposed to hepatitis A. Hepatitis B vaccine  You may need this if you have certain conditions or if you travel or work in places where you may be exposed to hepatitis B. Haemophilus influenzae type b (Hib) vaccine  You may need this if you have certain conditions. Human papillomavirus (HPV) vaccine  If recommended by your health care provider, you may need three doses over 6 months. You may receive vaccines as individual doses or as more than one vaccine together in one shot (combination vaccines). Talk with your health care provider about the risks and benefits of combination vaccines. What tests do I need? Blood tests  Lipid and cholesterol levels. These may be checked every 5 years, or more frequently if you are over 46 years old.  Hepatitis C test.  Hepatitis B test. Screening  Lung cancer screening. You may have this screening every year starting at age 53 if you have a 30-pack-year history of smoking and currently smoke or have quit within the past 15 years.  Colorectal cancer screening. All adults should have this screening starting at age 24 and continuing until age 67. Your health care provider may  recommend screening at age 81 if you are at increased risk. You will have tests every 1-10 years, depending on your results and the type of screening test.  Diabetes screening. This is done by checking your blood sugar (glucose) after you have not eaten for a while (fasting). You may have this done every 1-3 years.  Mammogram. This may be  done every 1-2 years. Talk with your health care provider about when you should start having regular mammograms. This may depend on whether you have a family history of breast cancer.  BRCA-related cancer screening. This may be done if you have a family history of breast, ovarian, tubal, or peritoneal cancers.  Pelvic exam and Pap test. This may be done every 3 years starting at age 4. Starting at age 33, this may be done every 5 years if you have a Pap test in combination with an HPV test. Other tests  Sexually transmitted disease (STD) testing.  Bone density scan. This is done to screen for osteoporosis. You may have this scan if you are at high risk for osteoporosis. Follow these instructions at home: Eating and drinking  Eat a diet that includes fresh fruits and vegetables, whole grains, lean protein, and low-fat dairy.  Take vitamin and mineral supplements as recommended by your health care provider.  Do not drink alcohol if: ? Your health care provider tells you not to drink. ? You are pregnant, may be pregnant, or are planning to become pregnant.  If you drink alcohol: ? Limit how much you have to 0-1 drink a day. ? Be aware of how much alcohol is in your drink. In the [place] one drink equals one 12 oz bottle of beer (355 mL), one 5 oz glass of wine (148 mL), or one 1 oz glass of hard liquor (44 mL). Lifestyle  Take daily care of your teeth and gums.  Stay active. Exercise for at least 30 minutes on 5 or more days each week.  Do not use any products that contain nicotine or tobacco, such as cigarettes, e-cigarettes, and chewing tobacco. If you need help quitting, ask your health care provider.  If you are sexually active, practice safe sex. Use a condom or other form of birth control (contraception) in order to prevent pregnancy and STIs (sexually transmitted infections).  If told by your health care provider, take low-dose aspirin daily starting at age 51. What's  next?  Visit your health care provider once a year for a well check visit.  Ask your health care provider how often you should have your eyes and teeth checked.  Stay up to date on all vaccines. This information is not intended to replace advice given to you by your health care provider. Make sure you discuss any questions you have with your health care provider. Document Released: 01/23/2015 Document Revised: 09/07/2017 Document Reviewed: 09/07/2017 Elsevier Patient Education  2020 [employer].

## 2018-11-19 NOTE — Assessment & Plan Note (Signed)
Pt has hx of this.  Check labs and replete prn. 

## 2018-11-19 NOTE — Progress Notes (Signed)
   Subjective:    Patient ID: Carol Quinn, female    DOB: 1955-04-10, 63 y.o.   MRN: [id number]  HPI Here today for CPE and MWV.  Risk Factors: Choroid melanoma- s/p enucleation.  Following w/ Dr Marin Olp.  Had recent CT scans August that showed no sign of spread. COPD- chronic problem, following w/ Dr Annamaria Boots, on Breztri, Formoterol, Trelegy Ellipta (on hold while trying [facility]), Albuterol.  Now on Prednisone daily. HTN- chronic problem, on Valsartan HCTZ 160/25mg  daily w/ good control Hyperlipidemia- chronic problem, on Lipitor 20mg  daily.  No regular exercise. Overweight- pt is unable to exercise due to lung condition, is trying to get better handle on diet, 'i'm basically starving myself'. Physical Activity: no regular exercise, has home O2 Fall Risk: low Depression: chronic problem, on Lexapro and Wellbutrin Hearing: normal to conversational tones and whispered voice ADL's: independent Cognitive: normal linear thought process, memory and attention intact Home Safety: safe at home Height, Weight, BMI, Visual Acuity: see vitals, vision corrected to w/ glasses Counseling: UTD on colonoscopy, mammo, immunizations Labs Ordered: See A&P Care Plan: See A&P   Patient Care Team    Relationship Specialty Notifications Start End  Midge Minium, MD PCP - General Family Medicine  07/17/10   Lynder Parents., MD  Obstetrics and Gynecology  02/14/14   Hale Bogus., MD Referring Physician Gastroenterology  02/14/14   Iran Ouch, MD Referring Physician Ophthalmology  05/10/17       Review of Systems Patient reports no vision/ hearing changes, adenopathy,fever, weight change,  persistant/recurrent hoarseness , swallowing issues, chest pain, palpitations, edema, hemoptysis, gastrointestinal bleeding (melena, rectal bleeding), abdominal pain, significant heartburn, bowel changes, GU symptoms (dysuria, hematuria, incontinence), Gyn symptoms (abnormal  bleeding, pain),  syncope, focal  weakness, memory loss, numbness & tingling, skin/hair/nail changes, abnormal bruising or bleeding, anxiety, or depression.  + SOB, chronic cough    Objective:   Physical Exam General Appearance:    Alert, cooperative, no distress, appears stated age  Head:    Normocephalic, without obvious abnormality, atraumatic  Eyes:    L pupil reactive, conjunctiva/corneas clear, EOM's intact, R eye prosthesis  Ears:    Normal TM's and external ear canals, both ears  Nose:   Deferred due to COVID  Throat:   Neck:   Supple, symmetrical, trachea midline, no adenopathy;    Thyroid: no enlargement/tenderness/nodules  Back:     Symmetric, no curvature, ROM normal, no CVA tenderness  Lungs:     Clear to auscultation bilaterally, respirations unlabored  Chest Wall:    No tenderness or deformity   Heart:    Regular rate and rhythm, S1 and S2 normal, no murmur, rub   or gallop  Breast Exam:    Deferred to GYN  Abdomen:     Soft, non-tender, bowel sounds active all four quadrants,    no masses, no organomegaly  Genitalia:    Deferred to GYN  Rectal:    Extremities:   Extremities normal, atraumatic, no cyanosis or edema  Pulses:   2+ and symmetric all extremities  Skin:   Skin color, texture, turgor normal, no rashes or lesions  Lymph nodes:   Cervical, supraclavicular, and axillary nodes normal  Neurologic:   CNII-XII intact, normal strength, sensation and reflexes    throughout         Assessment & Plan:

## 2018-11-19 NOTE — Assessment & Plan Note (Signed)
Ongoing issue for pt.  Discussed weight loss options as her ability to exercise is limited due to COPD.  Rather than 'starving' herself, encouraged her to enroll in online [facility].  Will continue to monitor for improvement.

## 2018-11-19 NOTE — Assessment & Plan Note (Signed)
Ongoing issue.  Following w/ Dr Annamaria Boots.  She is on a trial of Breztri and now on daily Prednisone.  Has home O2 that she uses PRN.  Will continue to follow.

## 2018-11-19 NOTE — Assessment & Plan Note (Signed)
Pt's PE unchanged from previous.  UTD on colonoscopy, mammo, and immunizations.  Check labs.  Anticipatory guidance provided.

## 2018-11-19 NOTE — Assessment & Plan Note (Signed)
Chronic problem.  Tolerating statin w/o difficulty.  Check labs.  Adjust meds prn  

## 2018-11-19 NOTE — Assessment & Plan Note (Signed)
Chronic problem.  Adequate control.  Currently asymptomatic.  Check labs.  No anticipated med changes.  Will follow. 

## 2018-11-20 ENCOUNTER — Encounter: Payer: Self-pay | Admitting: Family Medicine

## 2018-11-21 ENCOUNTER — Ambulatory Visit: Payer: Medicare Other | Admitting: Internal Medicine

## 2018-11-29 ENCOUNTER — Other Ambulatory Visit: Payer: Self-pay | Admitting: Sports Medicine

## 2018-12-19 ENCOUNTER — Other Ambulatory Visit (HOSPITAL_BASED_OUTPATIENT_CLINIC_OR_DEPARTMENT_OTHER): Payer: Self-pay | Admitting: Family Medicine

## 2018-12-19 ENCOUNTER — Other Ambulatory Visit (HOSPITAL_BASED_OUTPATIENT_CLINIC_OR_DEPARTMENT_OTHER): Payer: Self-pay | Admitting: Obstetrics and Gynecology

## 2018-12-19 DIAGNOSIS — Z1231 Encounter for screening mammogram for malignant neoplasm of breast: Secondary | ICD-10-CM

## 2018-12-20 ENCOUNTER — Ambulatory Visit (HOSPITAL_BASED_OUTPATIENT_CLINIC_OR_DEPARTMENT_OTHER): Payer: Medicare Other

## 2018-12-24 ENCOUNTER — Other Ambulatory Visit: Payer: Self-pay

## 2018-12-24 ENCOUNTER — Ambulatory Visit (HOSPITAL_BASED_OUTPATIENT_CLINIC_OR_DEPARTMENT_OTHER)
Admission: RE | Admit: 2018-12-24 | Discharge: 2018-12-24 | Disposition: A | Discharge status: Home / Self Care | Payer: Medicare Other | Source: Ambulatory Visit | Attending: Obstetrics and Gynecology | Admitting: Obstetrics and Gynecology

## 2018-12-24 DIAGNOSIS — Z1231 Encounter for screening mammogram for malignant neoplasm of breast: Secondary | ICD-10-CM | POA: Insufficient documentation

## 2018-12-28 ENCOUNTER — Other Ambulatory Visit: Payer: Self-pay | Admitting: Family Medicine

## 2019-01-29 ENCOUNTER — Other Ambulatory Visit: Payer: Self-pay | Admitting: Family Medicine

## 2019-02-18 ENCOUNTER — Encounter: Payer: Self-pay | Admitting: Family Medicine

## 2019-02-22 ENCOUNTER — Inpatient Hospital Stay (HOSPITAL_BASED_OUTPATIENT_CLINIC_OR_DEPARTMENT_OTHER): Payer: Medicare Other | Admitting: Hematology & Oncology

## 2019-02-22 ENCOUNTER — Encounter: Payer: Self-pay | Admitting: Hematology & Oncology

## 2019-02-22 ENCOUNTER — Encounter (HOSPITAL_BASED_OUTPATIENT_CLINIC_OR_DEPARTMENT_OTHER): Payer: Self-pay

## 2019-02-22 ENCOUNTER — Ambulatory Visit (HOSPITAL_BASED_OUTPATIENT_CLINIC_OR_DEPARTMENT_OTHER)
Admission: RE | Admit: 2019-02-22 | Discharge: 2019-02-22 | Disposition: A | Discharge status: Home / Self Care | Payer: Medicare Other | Source: Ambulatory Visit | Attending: Hematology & Oncology | Admitting: Hematology & Oncology

## 2019-02-22 ENCOUNTER — Inpatient Hospital Stay: Payer: Medicare Other | Attending: Hematology & Oncology

## 2019-02-22 ENCOUNTER — Other Ambulatory Visit: Payer: Self-pay

## 2019-02-22 ENCOUNTER — Encounter: Payer: Self-pay | Admitting: *Deleted

## 2019-02-22 VITALS — BP 130/65 | HR 89 | Temp 97.3°F | Resp 19 | Wt 163.0 lb

## 2019-02-22 DIAGNOSIS — C6931 Malignant neoplasm of right choroid: Secondary | ICD-10-CM | POA: Diagnosis present

## 2019-02-22 DIAGNOSIS — J449 Chronic obstructive pulmonary disease, unspecified: Secondary | ICD-10-CM | POA: Insufficient documentation

## 2019-02-22 LAB — CBC WITH DIFFERENTIAL (CANCER CENTER ONLY)
Abs Immature Granulocytes: 0.04 10*3/uL (ref 0.00–0.07)
Basophils Absolute: 0.1 10*3/uL (ref 0.0–0.1)
Basophils Relative: 2 %
Eosinophils Absolute: 0.3 10*3/uL (ref 0.0–0.5)
Eosinophils Relative: 3 %
HCT: 44.1 % (ref 36.0–46.0)
Hemoglobin: 14.4 g/dL (ref 12.0–15.0)
Immature Granulocytes: 1 %
Lymphocytes Relative: 23 %
Lymphs Abs: 1.9 10*3/uL (ref 0.7–4.0)
MCH: 32.1 pg (ref 26.0–34.0)
MCHC: 32.7 g/dL (ref 30.0–36.0)
MCV: 98.2 fL (ref 80.0–100.0)
Monocytes Absolute: 0.9 10*3/uL (ref 0.1–1.0)
Monocytes Relative: 11 %
Neutro Abs: 5.2 10*3/uL (ref 1.7–7.7)
Neutrophils Relative %: 60 %
Platelet Count: 322 10*3/uL (ref 150–400)
RBC: 4.49 MIL/uL (ref 3.87–5.11)
RDW: 11.6 % (ref 11.5–15.5)
WBC Count: 8.5 10*3/uL (ref 4.0–10.5)
nRBC: 0 % (ref 0.0–0.2)

## 2019-02-22 LAB — CMP (CANCER CENTER ONLY)
ALT: 22 U/L (ref 0–44)
AST: 17 U/L (ref 15–41)
Albumin: 4.4 g/dL (ref 3.5–5.0)
Alkaline Phosphatase: 75 U/L (ref 38–126)
Anion gap: 10 (ref 5–15)
BUN: 15 mg/dL (ref 8–23)
CO2: 33 mmol/L — ABNORMAL HIGH (ref 22–32)
Calcium: 10 mg/dL (ref 8.9–10.3)
Chloride: 103 mmol/L (ref 98–111)
Creatinine: 0.82 mg/dL (ref 0.44–1.00)
GFR, Est AFR Am: >60 mL/min (ref >60)
GFR, Estimated: >60 mL/min (ref >60)
Glucose, Bld: 113 mg/dL — ABNORMAL HIGH (ref 70–99)
Potassium: 4.4 mmol/L (ref 3.5–5.1)
Sodium: 146 mmol/L — ABNORMAL HIGH (ref 135–145)
Total Bilirubin: 0.5 mg/dL (ref 0.3–1.2)
Total Protein: 6.7 g/dL (ref 6.5–8.1)

## 2019-02-22 LAB — LACTATE DEHYDROGENASE: LDH: 185 U/L (ref 98–192)

## 2019-02-22 MED ORDER — IOHEXOL 300 MG/ML  SOLN
100.0000 mL | Freq: Once | INTRAMUSCULAR | Status: AC | PRN
  Administered 2019-02-22: 100 mL via INTRAVENOUS

## 2019-02-22 NOTE — Progress Notes (Signed)
Hematology and Oncology Follow Up Visit  Carol Quinn [id number] 07/22/1955 64 y.o. 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  -She is doing pretty well.  Unfortunately, she still is not happy about having to wear a facemask because of the coronavirus.  This really does affect her breathing.  There is been no issues with pain.  She has had no problems with nausea or vomiting.  She has had no change in bowel or bladder habits.  She had a CT scan done today.  Unfortunately, the results not yet back.  She showed me a picture of a dark lesion on the back of her right leg.  It is hard to say what this might be.  When I looked at it, it was dark blue.  It was not all that firm.  It probably measured about maybe 2-3 mm.  I told her that she concerning go to her dermatologist and they can take a look at this and maybe even biopsied.  She has had no problems with fever.  There is no headache.  Overall, her performance status is ECOG 1.   Medications:  Allergies as of 02/22/2019      Reactions   Clarithromycin Diarrhea, Rash, Other (See Comments)   Severe diarrhea  Rash Other reaction(s): DIARRHEA   Hydrocodone-homatropine Other (See Comments)   paranoia   Hydrocodone-homatropine Other (See Comments)   paranoia   Penicillins Other (See Comments)   Unknown reaction, childhood allergy Has patient had a PCN reaction causing immediate rash, facial/tongue/throat swelling, SOB or lightheadedness with hypotension: Unknown Has patient had a PCN reaction causing severe rash involving mucus membranes or skin necrosis: Unknown Has patient had a PCN reaction that required hospitalization: Unknown Has patient had a PCN reaction occurring within the last 10 years: No If all of the above answers are  "NO", then may proceed with Cephalosporin use. unknown Unknown reaction, childhood allergy   Topiramate Other (See Comments)   WEIGHT LOSS Other reaction(s): WEIGHT LOSS WEIGHT LOSS   Codeine Itching      Medication List       Accurate as of February 22, 2019  9:59 AM. If you have any questions, ask your nurse or doctor.        albuterol (2.5 MG/3ML) 0.083% nebulizer solution Commonly known as: PROVENTIL Take 3 mLs (2.5 mg total) by nebulization every 6 (six) hours as needed for wheezing or shortness of breath.   albuterol 108 (90 Base) MCG/ACT inhaler Commonly known as: VENTOLIN HFA Inhale 2 puffs into the lungs every 6 (six) hours as needed for wheezing or shortness of breath.   atorvastatin 20 MG tablet Commonly known as: LIPITOR TAKE 1 TABLET BY MOUTH ONCE DAILY   Breztri Aerosphere 160-9-4.8 MCG/ACT Aero Generic drug: Budeson-Glycopyrrol-Formoterol Inhale 2 puffs into the lungs 2 (two) times daily.   buPROPion 150 MG 24 hr tablet Commonly known as: WELLBUTRIN XL TAKE 1 TABLET BY MOUTH DAILY   erythromycin ophthalmic ointment Place into the right eye nightly.   escitalopram 20 MG tablet Commonly known as: LEXAPRO TAKE 1 TABLET BY MOUTH DAILY   estradiol 0.05 MG/24HR patch Commonly known as: VIVELLE-DOT Place 0.05 mg onto the skin. What changed: Another medication with the same name was removed. Continue taking this medication, and follow the directions you see  here. Changed by: [name], MD   fexofenadine 180 MG tablet Commonly known as: ALLEGRA Take 180 mg by mouth daily.   Flutter Devi Use as directed   modafinil 200 MG tablet Commonly known as: PROVIGIL Take 1 tablet (200 mg total) by mouth daily.   montelukast 10 MG tablet Commonly known as: SINGULAIR TAKE 1 TABLET BY MOUTH DAILY AT BEDTIME   omeprazole 40 MG capsule Commonly known as: PRILOSEC TAKE 1 CAPSULE BY MOUTH DAILY   Perforomist 20 MCG/2ML nebulizer solution Generic drug:  formoterol Take 2 mLs (20 mcg total) by nebulization 2 (two) times daily.   predniSONE 10 MG tablet Commonly known as: DELTASONE 1 daily or as directed   PreviDent 5000 Booster Plus 1.1 % Pste Generic drug: Sodium Fluoride   SUMAtriptan 100 MG tablet Commonly known as: IMITREX Take one tablet at onset of migraine, repeat in 2 hours if needed. What changed:   how much to take  how to take this  when to take this  reasons to take this  additional instructions   terbinafine 250 MG tablet Commonly known as: LamISIL Take 1 tablet (250 mg total) by mouth daily.   traMADol 50 MG tablet Commonly known as: ULTRAM Take 50 mg by mouth every 8 (eight) hours as needed for moderate pain.   traMADol 50 MG tablet Commonly known as: Ultram Take 1 tablet (50 mg total) by mouth every 6 (six) hours as needed.   Trelegy Ellipta 100-62.5-25 MCG/INH Aepb Generic drug: Fluticasone-Umeclidin-Vilant INHALE 1 PUFF INTO THE LUNGS DAILY   valsartan-hydrochlorothiazide 160-25 MG tablet Commonly known as: DIOVAN-HCT TAKE 1 TABLET BY MOUTH DAILY   zolmitriptan 5 MG nasal solution Commonly known as: ZOMIG Place 1 spray into the nose as needed for migraine.       Allergies:  Allergies  Allergen Reactions  . Clarithromycin Diarrhea, Rash and Other (See Comments)    Severe diarrhea  Rash Other reaction(s): DIARRHEA  . Hydrocodone-Homatropine Other (See Comments)    paranoia  . Hydrocodone-Homatropine Other (See Comments)    paranoia  . Penicillins Other (See Comments)    Unknown reaction, childhood allergy Has patient had a PCN reaction causing immediate rash, facial/tongue/throat swelling, SOB or lightheadedness with hypotension: Unknown Has patient had a PCN reaction causing severe rash involving mucus membranes or skin necrosis: Unknown Has patient had a PCN reaction that required hospitalization: Unknown Has patient had a PCN reaction occurring within the last 10 years: No If all  of the above answers are "NO", then may proceed with Cephalosporin use.  unknown Unknown reaction, childhood allergy  . Topiramate Other (See Comments)    WEIGHT LOSS Other reaction(s): WEIGHT LOSS WEIGHT LOSS  . Codeine Itching    Past Medical History, Surgical history, Social history, and Family History were reviewed and updated.  Review of Systems: Review of Systems  Constitutional: Negative.   HENT: Negative.   Eyes: Negative.   Respiratory: Negative.   Cardiovascular: Negative.   Gastrointestinal: Positive for abdominal pain.  Genitourinary: Negative.   Musculoskeletal: Negative.   Skin: Negative.   Neurological: Negative.   Endo/Heme/Allergies: Negative.   Psychiatric/Behavioral: Negative.       Physical Exam:  weight is 163 lb (73.9 kg). Her temporal temperature is 97.3 F (36.3 C) (abnormal). Her blood pressure is 130/65 and her pulse is 89. Her respiration is 19 and oxygen saturation is 98%.   Wt Readings from Last 3 Encounters:  02/22/19 163 lb (73.9 kg)  11/19/18 159 lb  2 oz (72.2 kg)  10/30/18 158 lb 6.4 oz (71.8 kg)    Physical Exam Vitals reviewed.  HENT:     Head: Normocephalic and atraumatic.  Eyes:     Pupils: Pupils are equal, round, and reactive to light.     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  Left eye is unremarkable.  She has good extraocular muscle movement of the left eye.  Cardiovascular:     Rate and Rhythm: Normal rate and regular rhythm.     Heart sounds: Normal heart sounds.  Pulmonary:     Effort: Pulmonary effort is normal.     Breath sounds: Normal breath sounds.  Abdominal:     General: Bowel sounds are normal.     Palpations: Abdomen is soft.  Musculoskeletal:        General: No tenderness or deformity. Normal range of motion.     Cervical back: Normal range of motion.  Lymphadenopathy:     Cervical: No cervical adenopathy.  Skin:    General: Skin is warm and dry.     Findings: No erythema or rash.   Neurological:     Mental Status: She is alert and oriented to person, place, and time.  Psychiatric:        Behavior: Behavior normal.        Thought Content: Thought content normal.        Judgment: Judgment normal.      Lab Results  Component Value Date   WBC 8.5 02/22/2019   HGB 14.4 02/22/2019   HCT 44.1 02/22/2019   MCV 98.2 02/22/2019   PLT 322 02/22/2019   No results found for: FERRITIN, IRON, TIBC, UIBC, IRONPCTSAT Lab Results  Component Value Date   RBC 4.49 02/22/2019   No results found for: KPAFRELGTCHN, LAMBDASER, KAPLAMBRATIO No results found for: IGGSERUM, IGA, IGMSERUM No results found for: Kathrynn Ducking, MSPIKE, SPEI   Chemistry      Component Value Date/Time   NA 146 (H) 02/22/2019 0821   NA 143 12/23/2016 0848   K 4.4 02/22/2019 0821   K 4.3 12/23/2016 0848   CL 103 02/22/2019 0821   CL 99 12/23/2016 0848   CO2 33 (H) 02/22/2019 0821   CO2 31 12/23/2016 0848   BUN 15 02/22/2019 0821   BUN 10 12/23/2016 0848   CREATININE 0.82 02/22/2019 0821   CREATININE 1.0 12/23/2016 0848      Component Value Date/Time   CALCIUM 10.0 02/22/2019 0821   CALCIUM 9.4 12/23/2016 0848   ALKPHOS 75 02/22/2019 0821   ALKPHOS 99 (H) 12/23/2016 0848   AST 17 02/22/2019 0821   ALT 22 02/22/2019 0821   ALT 26 12/23/2016 0848   BILITOT 0.5 02/22/2019 0821      Impression and Plan: Ms. Beville is a very pleasant 64 yo caucasian female with a choroidal melanoma of the right eye with right eye enucleation in October 2017.   Hopefully, the CAT scan will not show any evidence of recurrence.  We still have to follow her along every 6 months with scans.  I think that she is at significant risk for recurrent disease.  I am amazed that she has 4 great-grandchildren.  She certainly is enjoying them.     [name], MD 2/12/20219:59 AM

## 2019-03-26 ENCOUNTER — Other Ambulatory Visit: Payer: Self-pay | Admitting: Family Medicine

## 2019-04-30 ENCOUNTER — Encounter: Payer: Self-pay | Admitting: Internal Medicine

## 2019-04-30 ENCOUNTER — Other Ambulatory Visit: Payer: Self-pay

## 2019-04-30 ENCOUNTER — Ambulatory Visit (INDEPENDENT_AMBULATORY_CARE_PROVIDER_SITE_OTHER): Payer: Medicare Other | Admitting: Internal Medicine

## 2019-04-30 DIAGNOSIS — J449 Chronic obstructive pulmonary disease, unspecified: Secondary | ICD-10-CM | POA: Diagnosis not present

## 2019-04-30 DIAGNOSIS — J3089 Other allergic rhinitis: Secondary | ICD-10-CM | POA: Diagnosis not present

## 2019-04-30 DIAGNOSIS — Z720 Tobacco use: Secondary | ICD-10-CM

## 2019-04-30 DIAGNOSIS — J302 Other seasonal allergic rhinitis: Secondary | ICD-10-CM

## 2019-04-30 DIAGNOSIS — J9611 Chronic respiratory failure with hypoxia: Secondary | ICD-10-CM

## 2019-04-30 MED ORDER — TRELEGY ELLIPTA 100-62.5-25 MCG/INH IN AEPB: 1.0000 | INHALATION_SPRAY | Freq: Every day | RESPIRATORY_TRACT | 11 refills | Status: DC

## 2019-04-30 MED ORDER — ALBUTEROL SULFATE HFA 108 (90 BASE) MCG/ACT IN AERS: 2.0000 | INHALATION_SPRAY | Freq: Four times a day (QID) | RESPIRATORY_TRACT | 12 refills | Status: DC | PRN

## 2019-04-30 MED ORDER — MODAFINIL 200 MG PO TABS: 200.0000 mg | ORAL_TABLET | Freq: Every day | ORAL | 5 refills | Status: DC

## 2019-04-30 MED ORDER — ALBUTEROL SULFATE (2.5 MG/3ML) 0.083% IN NEBU: 2.5000 mg | INHALATION_SOLUTION | Freq: Four times a day (QID) | RESPIRATORY_TRACT | 11 refills | Status: DC | PRN

## 2019-04-30 NOTE — Progress Notes (Signed)
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 We discussed steroid side effects and goal of using lowest dose necessary. Had flu vax. Discussed Covid guidelines, with questions about outdoor exposures. CT chest 08/22/2018-  IMPRESSION: 1. No findings of active malignancy in the chest, abdomen, or pelvis. 2. A left inguinal lymph node at 1.0 cm in short axis is minimally enlarged from prior at 0.8 cm. Although not overtly pathologically enlarged for this location, surveillance is likely warranted. 3. Other imaging findings of potential clinical significance: Aortic Atherosclerosis (ICD10-I70.0). Emphysema (ICD10-J43.9). Small type 1 hiatal hernia. Collapsing cyst in the liver. Sigmoid  colon diverticulosis. Small left adrenal adenoma.  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  Seasonal pollen rhinitis, drainage with mild sore throat.  Oncology continues to follow her closely with no melanoma mets. CT chest 02/22/19- IMPRESSION: Lungs/Pleura: Biapical pleural-parenchymal scarring, left greater than right. Moderate centrilobular and paraseptal emphysematous changes, upper lung predominant. No suspicious pulmonary nodules. No focal consolidation. No pleural effusion or pneumothorax. No evidence of metastatic disease in the chest, abdomen, or pelvis. Aortic Atherosclerosis (ICD10-I70.0) and Emphysema (ICD10-J43.9).  ROS-see HPI   + = positive Constitutional:   No-   weight loss, night sweats, fevers, chills, +fatigue, lassitude. HEENT:   No-  headaches, difficulty swallowing, tooth/dental problems, sore throat,       No-  sneezing, itching, ear ache, +nasal congestion, +post nasal drip,  CV:  No-   chest pain, orthopnea, PND, swelling in lower extremities, anasarca, dizziness, palpitations Resp: + shortness of breath with exertion or at rest.                productive cough,  + non-productive cough,  No- coughing up of blood.               change in color of mucus.  + wheezing.   Skin: No-   rash or lesions. GI:  No-   heartburn, indigestion, abdominal pain, nausea, vomiting,  GU:  MS:  No-   joint pain or swelling.   Neuro-     nothing unusual Psych:  No-  change  in mood or affect. No depression or anxiety.  No memory loss.  OBJ- Physical Exam    +Room air here today General- Alert, Oriented, Affect-appropriate,  + overweight Skin- clear  Lymphadenopathy- none Head- atraumatic            Eyes- + R prosthesis            Ears- Hearing, canals-normal            Nose- clear, no-Septal dev, mucus, polyps, erosion, perforation             Throat- Mallampati IV , mucosa clear/ +minimally red , drainage- none, tonsils- small residual Neck- flexible , trachea midline, no stridor , thyroid nl, carotid no bruit Chest - symmetrical excursion , unlabored           Heart/CV- RRR , no murmur , no gallop  , no rub, nl s1 s2                           - JVD- none , edema- none, stasis changes- none, varices- none           Lung-  +distant/ clear, Wheeze- none, cough-none , dullness-none, rub- none,             Chest wall-  Abd-  Br/ Gen/ Rectal- Not done, not indicated Extrem- cyanosis- none, clubbing, none, atrophy- none, strength- nl Neuro- grossly intact to observation

## 2019-04-30 NOTE — Patient Instructions (Signed)
Med refills e-sent  Please call if we can help

## 2019-04-30 NOTE — Assessment & Plan Note (Signed)
Controlled on current meds Plan- med talk and refills

## 2019-04-30 NOTE — Assessment & Plan Note (Signed)
Mild exacerbation. Can manage with flonase and antihistamines

## 2019-04-30 NOTE — Assessment & Plan Note (Signed)
Long term remission 

## 2019-04-30 NOTE — Assessment & Plan Note (Signed)
Needs to continue O2 2L esp for sleep. Educated on use of POC

## 2019-05-02 ENCOUNTER — Other Ambulatory Visit: Payer: Self-pay

## 2019-05-02 ENCOUNTER — Telehealth (INDEPENDENT_AMBULATORY_CARE_PROVIDER_SITE_OTHER): Payer: Medicare Other | Admitting: Family Medicine

## 2019-05-02 ENCOUNTER — Encounter: Payer: Self-pay | Admitting: Family Medicine

## 2019-05-02 DIAGNOSIS — M79661 Pain in right lower leg: Secondary | ICD-10-CM

## 2019-05-02 MED ORDER — DICLOFENAC SODIUM 1 % EX GEL: 2.0000 g | Freq: Four times a day (QID) | CUTANEOUS | 0 refills | Status: DC

## 2019-05-02 NOTE — Progress Notes (Signed)
I have discussed the procedure for the virtual visit with the patient who has given consent to proceed with assessment and treatment.   Pt unable to obtain vitals.  Leg pain happens 5 times in 1 hour. Usually within a 15 minute time span.   Davis Gourd, CMA

## 2019-05-02 NOTE — Progress Notes (Signed)
Virtual Visit via Video   I connected with patient on 05/02/19 at  9:30 AM EDT by a video enabled telemedicine application and verified that I am speaking with the correct person using two identifiers.  Location patient: Home Location provider: Acupuncturist, Office Persons participating in the virtual visit: Patient, Provider, [place] (Jess B)  I discussed the limitations of evaluation and management by telemedicine and the availability of in person appointments. The patient expressed understanding and agreed to proceed.  Subjective:   HPI:   Leg pain- R leg, 'weird leg pain'.  sxs started 5-6 days ago but worsened 2 days ago.  Pt reports she will have multiple pains that cluster in a 15 minute time span.  No bruising or bites present.  No redness or swelling of leg.  Pain is described as a 'pinching or stinging' pain.  Pain is just lateral to R shin.  No radiation of pain.  Pt reports she has been walking for the last month.  No known injury.  Pain will occur sporadically throughout the day and at night- can wake her from sleep.  Has not tried any OTC or home remedies.  Will occur w/ both rest and activity.  Area is mildly 'sensitive' to touch.    ROS:   See pertinent positives and negatives per HPI.  Patient Active Problem List   Diagnosis Date Noted  . Overweight (BMI 25.0-29.9) 05/22/2018  . Trigger finger of right thumb 02/19/2018  . Vitamin D deficiency 10/03/2017  . Special screening for malignant neoplasms, colon   . Benign neoplasm of sigmoid colon   . Benign neoplasm of descending colon   . Hyperlipidemia 06/28/2016  . Aortic atherosclerosis ([place]) 10/26/2015  . 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 Chronic respiratory failure with hypoxia ([place]) 03/05/2015  . Balance disorder 05/17/2013  . Routine general medical examination at a health care facility 12/21/2012  . Chest pain 12/21/2012  . Meralgia paresthetica 06/14/2012  . Palpitations 02/03/2012  .  COPD exacerbation ([place]) 04/08/2011  . HTN (hypertension) 04/08/2011  . Eye dryness 08/20/2010  . IBS (irritable bowel syndrome) 08/20/2010  . Depression 05/25/2010  . Migraine with aura 05/25/2010  . Hypersomnia 10/31/2008  . STRESS INCONTINENCE 10/31/2008  . DYSPNEA 06/27/2008  . Seasonal and perennial allergic rhinitis 05/03/2008  . G E R D 05/03/2008  . COPD mixed type ([place]) 04/18/2008    Social History   Tobacco Use  . Smoking status: Former Smoker    Packs/day: 0.50    Years: 40.00    Pack years: 20.00    Types: Cigarettes    Quit date: 11/27/2017    Years since quitting: 1.4  . Smokeless tobacco: Never Used  . Tobacco comment: Bad day 6-7 cigs a day  Substance Use Topics  . Alcohol use: Yes    Alcohol/week: 0.0 standard drinks    Comment: social    Current Outpatient Medications:  .  albuterol (PROVENTIL) (2.5 MG/3ML) 0.083% nebulizer solution, Take 3 mLs (2.5 mg total) by nebulization every 6 (six) hours as needed for wheezing or shortness of breath., Disp: 150 mL, Rfl: 11 .  albuterol (VENTOLIN HFA) 108 (90 Base) MCG/ACT inhaler, Inhale 2 puffs into the lungs every 6 (six) hours as needed for wheezing or shortness of breath., Disp: 18 g, Rfl: 12 .  atorvastatin (LIPITOR) 20 MG tablet, TAKE 1 TABLET BY MOUTH ONCE DAILY, Disp: 90 tablet, Rfl: 1 .  buPROPion (WELLBUTRIN XL) 150 MG 24 hr tablet,  TAKE 1 TABLET BY MOUTH DAILY, Disp: 30 tablet, Rfl: 6 .  escitalopram (LEXAPRO) 20 MG tablet, TAKE 1 TABLET BY MOUTH DAILY, Disp: 30 tablet, Rfl: 6 .  estradiol (VIVELLE-DOT) 0.05 MG/24HR patch, Place 0.05 mg onto the skin., Disp: , Rfl:  .  fexofenadine (ALLEGRA) 180 MG tablet, Take 180 mg by mouth daily., Disp: , Rfl:  .  Fluticasone-Umeclidin-Vilant (TRELEGY ELLIPTA) 100-62.5-25 MCG/INH AEPB, Inhale 1 puff into the lungs daily., Disp: 60 each, Rfl: 11 .  modafinil (PROVIGIL) 200 MG tablet, Take 1 tablet (200 mg total) by mouth daily., Disp: 30 tablet, Rfl: 5 .  montelukast  (SINGULAIR) 10 MG tablet, TAKE 1 TABLET BY MOUTH DAILY AT BEDTIME, Disp: 30 tablet, Rfl: prn .  omeprazole (PRILOSEC) 40 MG capsule, TAKE 1 CAPSULE BY MOUTH DAILY, Disp: 30 capsule, Rfl: 6 .  PREVIDENT 5000 BOOSTER PLUS 1.1 % PSTE, , Disp: , Rfl:  .  Respiratory Therapy Supplies (FLUTTER) DEVI, Use as directed, Disp: 1 each, Rfl: 0 .  SUMAtriptan (IMITREX) 100 MG tablet, Take one tablet at onset of migraine, repeat in 2 hours if needed. (Patient taking differently: Take 100 mg by mouth every 2 (two) hours as needed for migraine or headache. ), Disp: 10 tablet, Rfl: 6 .  terbinafine (LAMISIL) 250 MG tablet, Take 1 tablet (250 mg total) by mouth daily., Disp: 90 tablet, Rfl: 0 .  valsartan-hydrochlorothiazide (DIOVAN-HCT) 160-25 MG tablet, TAKE 1 TABLET BY MOUTH DAILY, Disp: 90 tablet, Rfl: 1 .  zolmitriptan (ZOMIG) 5 MG nasal solution, Place 1 spray into the nose as needed for migraine. , Disp: , Rfl:   Allergies  Allergen Reactions  . Clarithromycin Diarrhea, Rash and Other (See Comments)    Severe diarrhea  Rash Other reaction(s): DIARRHEA  . Hydrocodone-Homatropine Other (See Comments)    paranoia  . Hydrocodone-Homatropine Other (See Comments)    paranoia  . Penicillins Other (See Comments)    Unknown reaction, childhood allergy Has patient had a PCN reaction causing immediate rash, facial/tongue/throat swelling, SOB or lightheadedness with hypotension: Unknown Has patient had a PCN reaction causing severe rash involving mucus membranes or skin necrosis: Unknown Has patient had a PCN reaction that required hospitalization: Unknown Has patient had a PCN reaction occurring within the last 10 years: No If all of the above answers are "NO", then may proceed with Cephalosporin use.  unknown Unknown reaction, childhood allergy  . Topiramate Other (See Comments)    WEIGHT LOSS Other reaction(s): WEIGHT LOSS WEIGHT LOSS  . Codeine Itching    Objective:   There were no vitals taken  for this visit.  AAOx3, NAD NCAT, EOMI No obvious CN deficits Coloring WNL Pt is able to speak clearly, coherently without shortness of breath or increased work of breathing.  R lower leg normal appearing w/o redness, rash, swelling, or irritation Thought process is linear.  Mood is appropriate.   Assessment and Plan:   R lower leg pain- new.  sxs seem consistent w/ superficial nerve irritation, although no known cause.  Start topical NSAID gel, ice, and if no improvement by Monday, pt will schedule in office visit for better evaluation.  Reviewed supportive care and red flags that should prompt return.  Pt expressed understanding and is in agreement w/ plan.    Annye Asa, MD 05/02/2019

## 2019-05-07 ENCOUNTER — Encounter: Payer: Self-pay | Admitting: Family Medicine

## 2019-05-10 ENCOUNTER — Telehealth: Payer: Self-pay

## 2019-05-10 NOTE — Telephone Encounter (Signed)
PA request received from [facility]  Drug requested: Modafinil 200mg  CMM Key: [id number] Tried/failed:  Covered alternatives: PA request has been sent to plan, and a determination is expected within 3 days.   Routing to [place] for follow-up.

## 2019-05-20 ENCOUNTER — Other Ambulatory Visit: Payer: Self-pay

## 2019-05-20 ENCOUNTER — Telehealth (INDEPENDENT_AMBULATORY_CARE_PROVIDER_SITE_OTHER): Payer: Medicare Other | Admitting: Family Medicine

## 2019-05-20 ENCOUNTER — Encounter: Payer: Self-pay | Admitting: Family Medicine

## 2019-05-20 VITALS — BP 134/75 | HR 79 | Temp 97.3°F | Ht 62.0 in | Wt 161.0 lb

## 2019-05-20 DIAGNOSIS — I10 Essential (primary) hypertension: Secondary | ICD-10-CM | POA: Diagnosis not present

## 2019-05-20 DIAGNOSIS — E663 Overweight: Secondary | ICD-10-CM

## 2019-05-20 DIAGNOSIS — E785 Hyperlipidemia, unspecified: Secondary | ICD-10-CM | POA: Diagnosis not present

## 2019-05-20 NOTE — Progress Notes (Signed)
I have discussed the procedure for the virtual visit with the patient who has given consent to proceed with assessment and treatment.   Colt Martelle L Reighn Kaplan, CMA     

## 2019-05-20 NOTE — Progress Notes (Signed)
Virtual Visit via Video   I connected with patient on 05/20/19 at 11:30 AM EDT by a video enabled telemedicine application and verified that I am speaking with the correct person using two identifiers.  Location patient: Home Location provider: Acupuncturist, Office Persons participating in the virtual visit: Patient, Provider, [place] (Jess B)  I discussed the limitations of evaluation and management by telemedicine and the availability of in person appointments. The patient expressed understanding and agreed to proceed.  Subjective:   HPI:   HTN- chronic problem, on Valsartan HCTZ 160/25mg  daily w/ adequate control.  BP is 134/75 today.  No CP.  Pt has chronic SOB and is O2 dependent.  Denies HAs, visual changes.  No swelling of hands/feet.  Hyperlipidemia- chronic problem, on Lipitor 20mg  daily.  Denies abd pain, N/V.  Overweight- BMI is 29.45.  Pt is limited in her ability to exercise due to O2 dependency.    ROS:   See pertinent positives and negatives per HPI.  Patient Active Problem List   Diagnosis Date Noted  . Overweight (BMI 25.0-29.9) 05/22/2018  . Trigger finger of right thumb 02/19/2018  . Vitamin D deficiency 10/03/2017  . Special screening for malignant neoplasms, colon   . Benign neoplasm of sigmoid colon   . Benign neoplasm of descending colon   . Hyperlipidemia 06/28/2016  . Aortic atherosclerosis ([place]) 10/26/2015  . 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 Chronic respiratory failure with hypoxia ([place]) 03/05/2015  . Balance disorder 05/17/2013  . Routine general medical examination at a health care facility 12/21/2012  . Chest pain 12/21/2012  . Meralgia paresthetica 06/14/2012  . Palpitations 02/03/2012  . COPD exacerbation ([place]) 04/08/2011  . HTN (hypertension) 04/08/2011  . Eye dryness 08/20/2010  . IBS (irritable bowel syndrome) 08/20/2010  . Depression 05/25/2010  . Migraine with aura 05/25/2010  . Hypersomnia 10/31/2008  .  STRESS INCONTINENCE 10/31/2008  . DYSPNEA 06/27/2008  . Seasonal and perennial allergic rhinitis 05/03/2008  . G E R D 05/03/2008  . COPD mixed type ([place]) 04/18/2008    Social History   Tobacco Use  . Smoking status: Former Smoker    Packs/day: 0.50    Years: 40.00    Pack years: 20.00    Types: Cigarettes    Quit date: 11/27/2017    Years since quitting: 1.4  . Smokeless tobacco: Never Used  . Tobacco comment: Bad day 6-7 cigs a day  Substance Use Topics  . Alcohol use: Yes    Alcohol/week: 0.0 standard drinks    Comment: social    Current Outpatient Medications:  .  albuterol (PROVENTIL) (2.5 MG/3ML) 0.083% nebulizer solution, Take 3 mLs (2.5 mg total) by nebulization every 6 (six) hours as needed for wheezing or shortness of breath., Disp: 150 mL, Rfl: 11 .  albuterol (VENTOLIN HFA) 108 (90 Base) MCG/ACT inhaler, Inhale 2 puffs into the lungs every 6 (six) hours as needed for wheezing or shortness of breath., Disp: 18 g, Rfl: 12 .  atorvastatin (LIPITOR) 20 MG tablet, TAKE 1 TABLET BY MOUTH ONCE DAILY, Disp: 90 tablet, Rfl: 1 .  buPROPion (WELLBUTRIN XL) 150 MG 24 hr tablet, TAKE 1 TABLET BY MOUTH DAILY, Disp: 30 tablet, Rfl: 6 .  diclofenac Sodium (VOLTAREN) 1 % GEL, Apply 2 g topically 4 (four) times daily., Disp: 100 g, Rfl: 0 .  escitalopram (LEXAPRO) 20 MG tablet, TAKE 1 TABLET BY MOUTH DAILY, Disp: 30 tablet, Rfl: 6 .  estradiol (VIVELLE-DOT) 0.05 MG/24HR patch,  Place 0.05 mg onto the skin., Disp: , Rfl:  .  fexofenadine (ALLEGRA) 180 MG tablet, Take 180 mg by mouth daily., Disp: , Rfl:  .  Fluticasone-Umeclidin-Vilant (TRELEGY ELLIPTA) 100-62.5-25 MCG/INH AEPB, Inhale 1 puff into the lungs daily., Disp: 60 each, Rfl: 11 .  modafinil (PROVIGIL) 200 MG tablet, Take 1 tablet (200 mg total) by mouth daily., Disp: 30 tablet, Rfl: 5 .  montelukast (SINGULAIR) 10 MG tablet, TAKE 1 TABLET BY MOUTH DAILY AT BEDTIME, Disp: 30 tablet, Rfl: prn .  omeprazole (PRILOSEC) 40 MG  capsule, TAKE 1 CAPSULE BY MOUTH DAILY, Disp: 30 capsule, Rfl: 6 .  PREVIDENT 5000 BOOSTER PLUS 1.1 % PSTE, , Disp: , Rfl:  .  Respiratory Therapy Supplies (FLUTTER) DEVI, Use as directed, Disp: 1 each, Rfl: 0 .  SUMAtriptan (IMITREX) 100 MG tablet, Take one tablet at onset of migraine, repeat in 2 hours if needed. (Patient taking differently: Take 100 mg by mouth every 2 (two) hours as needed for migraine or headache. ), Disp: 10 tablet, Rfl: 6 .  terbinafine (LAMISIL) 250 MG tablet, Take 1 tablet (250 mg total) by mouth daily., Disp: 90 tablet, Rfl: 0 .  valsartan-hydrochlorothiazide (DIOVAN-HCT) 160-25 MG tablet, TAKE 1 TABLET BY MOUTH DAILY, Disp: 90 tablet, Rfl: 1 .  zolmitriptan (ZOMIG) 5 MG nasal solution, Place 1 spray into the nose as needed for migraine. , Disp: , Rfl:   Allergies  Allergen Reactions  . Clarithromycin Diarrhea, Rash and Other (See Comments)    Severe diarrhea  Rash Other reaction(s): DIARRHEA  . Hydrocodone-Homatropine Other (See Comments)    paranoia  . Hydrocodone-Homatropine Other (See Comments)    paranoia  . Penicillins Other (See Comments)    Unknown reaction, childhood allergy Has patient had a PCN reaction causing immediate rash, facial/tongue/throat swelling, SOB or lightheadedness with hypotension: Unknown Has patient had a PCN reaction causing severe rash involving mucus membranes or skin necrosis: Unknown Has patient had a PCN reaction that required hospitalization: Unknown Has patient had a PCN reaction occurring within the last 10 years: No If all of the above answers are "NO", then may proceed with Cephalosporin use.  unknown Unknown reaction, childhood allergy  . Topiramate Other (See Comments)    WEIGHT LOSS Other reaction(s): WEIGHT LOSS WEIGHT LOSS  . Codeine Itching    Objective:   BP 134/75   Pulse 79   Temp (!) 97.3 F (36.3 C) (Tympanic)   Ht 5\' 2"  (1.575 m)   Wt 161 lb (73 kg)   SpO2 93%   BMI 29.45 kg/m   AAOx3,  NAD NCAT, EOMI No obvious CN deficits O2 via  Coloring WNL Pt is able to speak clearly, coherently without shortness of breath or increased work of breathing.  Thought process is linear.  Mood is appropriate.   Assessment and Plan:   HTN- chronic problem.  Adequate control.  Currently asymptomatic.  Check labs.  No anticipated med changes.  Will follow.  Hyperlipidemia- chronic problem.  Tolerating statin w/o difficulty.  Check labs.  Adjust meds prn   Overweight- BMI is stable at 29.45.  Encouraged healthy diet and exercise as able.  Will follow.   Annye Asa, MD 05/20/2019

## 2019-05-22 ENCOUNTER — Ambulatory Visit (INDEPENDENT_AMBULATORY_CARE_PROVIDER_SITE_OTHER): Payer: Medicare Other | Admitting: Emergency Medicine

## 2019-05-22 ENCOUNTER — Ambulatory Visit: Payer: Medicare Other

## 2019-05-22 ENCOUNTER — Encounter: Payer: Self-pay | Admitting: Family Medicine

## 2019-05-22 ENCOUNTER — Other Ambulatory Visit: Payer: Self-pay

## 2019-05-22 ENCOUNTER — Other Ambulatory Visit: Payer: Self-pay | Admitting: Family Medicine

## 2019-05-22 DIAGNOSIS — E785 Hyperlipidemia, unspecified: Secondary | ICD-10-CM | POA: Diagnosis not present

## 2019-05-22 DIAGNOSIS — I10 Essential (primary) hypertension: Secondary | ICD-10-CM

## 2019-05-22 LAB — CBC WITH DIFFERENTIAL/PLATELET
Basophils Absolute: 0.1 10*3/uL (ref 0.0–0.1)
Basophils Relative: 1.3 % (ref 0.0–3.0)
Eosinophils Absolute: 0.3 10*3/uL (ref 0.0–0.7)
Eosinophils Relative: 3.9 % (ref 0.0–5.0)
HCT: 41.2 % (ref 36.0–46.0)
Hemoglobin: 13.9 g/dL (ref 12.0–15.0)
Lymphocytes Relative: 24.7 % (ref 12.0–46.0)
Lymphs Abs: 1.7 10*3/uL (ref 0.7–4.0)
MCHC: 33.7 g/dL (ref 30.0–36.0)
MCV: 94.8 fl (ref 78.0–100.0)
Monocytes Absolute: 0.7 10*3/uL (ref 0.1–1.0)
Monocytes Relative: 9.4 % (ref 3.0–12.0)
Neutro Abs: 4.2 10*3/uL (ref 1.4–7.7)
Neutrophils Relative %: 60.7 % (ref 43.0–77.0)
Platelets: 272 10*3/uL (ref 150.0–400.0)
RBC: 4.35 Mil/uL (ref 3.87–5.11)
RDW: 13.2 % (ref 11.5–15.5)
WBC: 7 10*3/uL (ref 4.0–10.5)

## 2019-05-22 LAB — LIPID PANEL
Cholesterol: 164 mg/dL (ref 0–200)
HDL: 62.6 mg/dL (ref >39.00)
LDL Cholesterol: 84 mg/dL (ref 0–99)
NonHDL: 101.5
Total CHOL/HDL Ratio: 3
Triglycerides: 90 mg/dL (ref 0.0–149.0)
VLDL: 18 mg/dL (ref 0.0–40.0)

## 2019-05-22 LAB — TSH: TSH: 3.06 u[IU]/mL (ref 0.35–4.50)

## 2019-05-22 LAB — BASIC METABOLIC PANEL
BUN: 16 mg/dL (ref 6–23)
CO2: 29 mEq/L (ref 19–32)
Calcium: 9.4 mg/dL (ref 8.4–10.5)
Chloride: 100 mEq/L (ref 96–112)
Creatinine, Ser: 0.7 mg/dL (ref 0.40–1.20)
GFR: 84.68 mL/min (ref >60.00)
Glucose, Bld: 120 mg/dL — ABNORMAL HIGH (ref 70–99)
Potassium: 3.5 mEq/L (ref 3.5–5.1)
Sodium: 141 mEq/L (ref 135–145)

## 2019-05-22 LAB — HEPATIC FUNCTION PANEL
ALT: 29 U/L (ref 0–35)
AST: 23 U/L (ref 0–37)
Albumin: 4.3 g/dL (ref 3.5–5.2)
Alkaline Phosphatase: 94 U/L (ref 39–117)
Bilirubin, Direct: 0.1 mg/dL (ref 0.0–0.3)
Total Bilirubin: 0.5 mg/dL (ref 0.2–1.2)
Total Protein: 6.3 g/dL (ref 6.0–8.3)

## 2019-05-23 ENCOUNTER — Other Ambulatory Visit (INDEPENDENT_AMBULATORY_CARE_PROVIDER_SITE_OTHER): Payer: Medicare Other

## 2019-05-23 DIAGNOSIS — R7309 Other abnormal glucose: Secondary | ICD-10-CM

## 2019-05-23 LAB — HEMOGLOBIN A1C: Hgb A1c MFr Bld: 6.1 % (ref 4.6–6.5)

## 2019-06-03 NOTE — Telephone Encounter (Signed)
PA authorization request form completed and faxed to [facility]. [facility] requesting further information.

## 2019-06-04 NOTE — Telephone Encounter (Signed)
Fax from [place] stated PA approved from 05/10/2019 until 11/09/2019.

## 2019-06-25 ENCOUNTER — Encounter: Payer: Self-pay | Admitting: Family Medicine

## 2019-06-27 ENCOUNTER — Other Ambulatory Visit: Payer: Self-pay

## 2019-06-27 ENCOUNTER — Encounter: Payer: Self-pay | Admitting: Family Medicine

## 2019-06-27 ENCOUNTER — Ambulatory Visit (INDEPENDENT_AMBULATORY_CARE_PROVIDER_SITE_OTHER): Payer: Medicare Other | Admitting: Family Medicine

## 2019-06-27 VITALS — BP 123/83 | HR 73 | Temp 97.9°F | Resp 16 | Ht 62.0 in | Wt 158.5 lb

## 2019-06-27 DIAGNOSIS — E785 Hyperlipidemia, unspecified: Secondary | ICD-10-CM

## 2019-06-27 DIAGNOSIS — H34232 Retinal artery branch occlusion, left eye: Secondary | ICD-10-CM | POA: Diagnosis not present

## 2019-06-27 DIAGNOSIS — H34212 Partial retinal artery occlusion, left eye: Secondary | ICD-10-CM | POA: Insufficient documentation

## 2019-06-27 NOTE — Patient Instructions (Signed)
Follow up as needed or as scheduled We'll call you with your neurology appt Continue the increased dose of Atorvastatin (40mg ) and Aspirin daily If you have any additional vision problems go straight to the ER Call with any questions or concerns Hang in there!

## 2019-06-27 NOTE — Progress Notes (Signed)
   Subjective:    Patient ID: Carol Quinn, female    DOB: [birth year], 64 y.o.   MRN: [id number]  [facility] f/u- pt went to ER on 6/13 w/ acute vision loss L eye.  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  CTA/MRI of head/neck were negative.  ECHO showed minimal shunting.  She currently has a Pharmacist, community on x14 days.  She is supposed to mail it back.  They increased Atorvastatin to 40mg  daily and ASA 81mg  daily.  Saw Dr Daralene Milch (Ophtho-Onc) at [facility] on 6/15 who recommended retinal specialist.  She has appt scheduled tomorrow.  Pt reports vision is improving (she only has 1 eye).  Pt is requesting Neuro f/u at [facility] rather than [facility].   Review of Systems For ROS see HPI   This visit occurred during the SARS-CoV-2 public health emergency.  Safety protocols were in place, including screening questions prior to the visit, additional usage of staff PPE, and extensive cleaning of exam room while observing appropriate contact time as indicated for disinfecting solutions.       Objective:   Physical Exam Vitals reviewed.  Constitutional:      General: She is not in acute distress.    Appearance: Normal appearance. She is not ill-appearing.  HENT:     Head: Normocephalic and atraumatic.  Cardiovascular:     Rate and Rhythm: Normal rate and regular rhythm.     Pulses: Normal pulses.  Pulmonary:     Effort: Pulmonary effort is normal.  Neurological:     General: No focal deficit present.     Mental Status: She is alert and oriented to person, place, and time.     Cranial Nerves: No cranial nerve deficit.     Motor: No weakness.     Gait: Gait normal.  Psychiatric:        Mood and Affect: Mood normal.        Behavior: Behavior normal.        Thought Content: Thought content normal.           Assessment & Plan:

## 2019-06-28 ENCOUNTER — Encounter: Payer: Self-pay | Admitting: Family Medicine

## 2019-07-02 ENCOUNTER — Telehealth: Payer: Self-pay | Admitting: Internal Medicine

## 2019-07-02 NOTE — Telephone Encounter (Signed)
Appro[employer]on April 30 Request Reference Number: [id number]. MODAFINIL TAB 200MG  is approved through 11/09/2019. Your patient may now fill this prescription and it will be covered. Nothing further needed.

## 2019-07-04 NOTE — Assessment & Plan Note (Signed)
Chronic problem.  Agree w/ increasing statin to 40mg  in setting of stroke.  Pt is aware of new dose and will continue

## 2019-07-04 NOTE — Assessment & Plan Note (Signed)
New.  Pt went to [facility] ER w/ acute vision loss and was transferred to [facility].  CTA/MRI of head and neck were negative.  ECHO showed minimal shunting but there were bubbles present.  Will refer to Cards and Neuro for appropriate f/u.  Has appt w/ Retinal specialist at [facility] tomorrow.  Will follow along.

## 2019-07-04 NOTE — Assessment & Plan Note (Signed)
See above.  No obvious cause at this time but will refer to Cards and Neuro for complete workups.  Increase statin to Lipitor 40mg  and continue ASA 81mg  daily.

## 2019-07-10 ENCOUNTER — Ambulatory Visit (INDEPENDENT_AMBULATORY_CARE_PROVIDER_SITE_OTHER): Payer: Medicare Other | Admitting: Diagnostic Neuroimaging

## 2019-07-10 ENCOUNTER — Other Ambulatory Visit: Payer: Self-pay

## 2019-07-10 ENCOUNTER — Encounter: Payer: Self-pay | Admitting: Diagnostic Neuroimaging

## 2019-07-10 VITALS — BP 112/75 | HR 98 | Ht 62.0 in | Wt 158.8 lb

## 2019-07-10 DIAGNOSIS — I671 Cerebral aneurysm, nonruptured: Secondary | ICD-10-CM

## 2019-07-10 DIAGNOSIS — H34232 Retinal artery branch occlusion, left eye: Secondary | ICD-10-CM

## 2019-07-10 NOTE — Progress Notes (Signed)
[facility]  PATIENT: Carol Quinn DOB: 08-31-1955  REFERRING CLINICIAN: Midge Minium, MD HISTORY FROM: patient  REASON FOR VISIT: new consult    HISTORICAL  CHIEF COMPLAINT:  Chief Complaint  Patient presents with  . L eye retinal artery occlusion    rm 6 New Pt husband- Remo Lipps  "6/13 noticed some loss of vision in L eye, [facility]-had tests, went tomy eye dr at [facility]- sent to retina specialist that recommended I see cardiology and neurology"    HISTORY OF PRESENT ILLNESS:   64 year old female with left branch retinal artery occlusion.  History of hypertension, COPD, emphysema, prosthetic right eye.  On 06/23/2019 patient had sudden onset of vision loss in her left eye, inferior nasal quadrant, and went to the ER for evaluation.  Patient was transferred to another hospital for evaluation.  Stroke work-up was completed.  Patient was started on aspirin, statin was increased, and discharged home.  Since then patient has followed up with ophthalmology.  She was set up to see [place] in clinic.  Also has cardiology appointment.  Vision symptoms are stable.   REVIEW OF SYSTEMS: Full 14 system review of systems performed and negative with exception of: As per HPI.  ALLERGIES: Allergies  Allergen Reactions  . Clarithromycin Diarrhea, Rash and Other (See Comments)    Severe diarrhea  Rash Other reaction(s): DIARRHEA  . Hydrocodone-Homatropine Other (See Comments)    paranoia  . Hydrocodone-Homatropine Other (See Comments)    paranoia  . Penicillins Other (See Comments)    Unknown reaction, childhood allergy Has patient had a PCN reaction causing immediate rash, facial/tongue/throat swelling, SOB or lightheadedness with hypotension: Unknown Has patient had a PCN reaction causing severe rash involving mucus membranes or skin necrosis: Unknown Has patient had a PCN reaction that required hospitalization: Unknown Has patient had a PCN  reaction occurring within the last 10 years: No If all of the above answers are "NO", then may proceed with Cephalosporin use.  unknown Unknown reaction, childhood allergy  . Topiramate Other (See Comments)    WEIGHT LOSS Other reaction(s): WEIGHT LOSS WEIGHT LOSS  . Codeine Itching    HOME MEDICATIONS: Outpatient Medications Prior to Visit  Medication Sig Dispense Refill  . albuterol (PROVENTIL) (2.5 MG/3ML) 0.083% nebulizer solution Take 3 mLs (2.5 mg total) by nebulization every 6 (six) hours as needed for wheezing or shortness of breath. 150 mL 11  . albuterol (VENTOLIN HFA) 108 (90 Base) MCG/ACT inhaler Inhale 2 puffs into the lungs every 6 (six) hours as needed for wheezing or shortness of breath. 18 g 12  . aspirin 81 MG EC tablet Take by mouth.    Marland Kitchen atorvastatin (LIPITOR) 40 MG tablet Take 40 mg by mouth daily.    Marland Kitchen buPROPion (WELLBUTRIN XL) 150 MG 24 hr tablet TAKE 1 TABLET BY MOUTH DAILY 30 tablet 6  . dorzolamide (TRUSOPT) 2 % ophthalmic solution Apply to eye.    . escitalopram (LEXAPRO) 20 MG tablet TAKE 1 TABLET BY MOUTH DAILY 30 tablet 6  . estradiol (VIVELLE-DOT) 0.05 MG/24HR patch Place 0.05 mg onto the skin.    . fexofenadine (ALLEGRA) 180 MG tablet Take 180 mg by mouth daily.    . Fluticasone-Umeclidin-Vilant (TRELEGY ELLIPTA) 100-62.5-25 MCG/INH AEPB Inhale 1 puff into the lungs daily. 60 each 11  . LYSINE PO Take by mouth daily.    . modafinil (PROVIGIL) 200 MG tablet Take 1 tablet (200 mg total) by mouth daily. [place]  tablet 5  . montelukast (SINGULAIR) 10 MG tablet TAKE 1 TABLET BY MOUTH DAILY AT BEDTIME 30 tablet prn  . naphazoline-pheniramine (NAPHCON-A) 0.025-0.3 % ophthalmic solution Apply to eye.    . Omega-3 Fatty Acids (FISH OIL) 1200 MG CAPS Take by mouth.    Marland Kitchen omeprazole (PRILOSEC) 40 MG capsule TAKE 1 CAPSULE BY MOUTH DAILY 30 capsule 6  . PREVIDENT 5000 BOOSTER PLUS 1.1 % PSTE     . Respiratory Therapy Supplies (FLUTTER) DEVI Use as directed 1 each 0  .  SUMAtriptan (IMITREX) 100 MG tablet Take one tablet at onset of migraine, repeat in 2 hours if needed. (Patient taking differently: Take 100 mg by mouth every 2 (two) hours as needed for migraine or headache. ) 10 tablet 6  . UNABLE TO FIND Med Name: Focus factor    . valsartan-hydrochlorothiazide (DIOVAN-HCT) 160-25 MG tablet TAKE 1 TABLET BY MOUTH DAILY 90 tablet 1  . zolmitriptan (ZOMIG) 5 MG nasal solution Place 1 spray into the nose as needed for migraine.     . diclofenac Sodium (VOLTAREN) 1 % GEL Apply 2 g topically 4 (four) times daily. 100 g 0  . terbinafine (LAMISIL) 250 MG tablet Take 1 tablet (250 mg total) by mouth daily. 90 tablet 0   No facility-administered medications prior to visit.    PAST MEDICAL HISTORY: Past Medical History:  Diagnosis Date  . Acute bronchitis   . Allergic rhinitis   . Anxiety   . Aortic atherosclerosis ([place])   . Cancer ([place])   . Childhood asthma   . Chronic headaches   . Complication of anesthesia    woke up during surgery x 1  . Complication of anesthesia    hard to wake up x 1  . COPD (chronic obstructive pulmonary disease) (HCC)    emphysema  . Depression   . Emphysema, unspecified ([place])   . Exposure to TB    uncle-her PPD neg  . GERD (gastroesophageal reflux disease)   . Hepatic cyst    left  . Hypertension   . MDS (myelodysplastic syndrome), low grade ([place]) 09/23/2016  . 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 RBBB (right bundle branch block with left anterior fascicular block)     PAST SURGICAL HISTORY: Past Surgical History:  Procedure Laterality Date  . BREAST BIOPSY  2002  . COLONOSCOPY WITH PROPOFOL N/A 03/21/2017   Procedure: COLONOSCOPY WITH PROPOFOL;  Surgeon: Jerene Bears, MD;  Location: [facility];  Service: Gastroenterology;  Laterality: N/A;  . NOSE SURGERY     "nasal deviation"  . TOTAL ABDOMINAL HYSTERECTOMY    . TRIGGER FINGER RELEASE Right 06/05/2018   Procedure: RIGHT THUMB RELEASE TRIGGER  FINGER/A-1 PULLEY;  Surgeon: Daryll Brod, MD;  Location: [facility];  Service: Orthopedics;  Laterality: Right;  . TUBAL LIGATION      FAMILY HISTORY: Family History  Problem Relation Age of Onset  . Heart failure Father   . Emphysema Mother   . Heart attack Mother   . Asthma Sister   . Asthma Maternal Uncle   . Arthritis Maternal Grandmother   . Cancer Maternal Grandmother   . Cancer Sister   . Cancer Other        aunts  . Cancer Other        uncles  . Colon cancer Other     SOCIAL HISTORY: Social History   Socioeconomic History  . Marital status: Married    Spouse name:  Remo Lipps  . Number of children: Not on file  . Years of education: Not on file  . Highest education level: Associate degree: academic program  Occupational History  . Occupation: Database administrator    Comment: [place] Use  . Smoking status: Former Smoker    Packs/day: 0.50    Years: 40.00    Pack years: 20.00    Types: Cigarettes    Quit date: 11/27/2017    Years since quitting: 1.6  . Smokeless tobacco: Never Used  Vaping Use  . Vaping Use: Former  Substance and Sexual Activity  . Alcohol use: Yes    Alcohol/week: 0.0 standard drinks    Comment: social  . Drug use: No  . Sexual activity: Not on file  Other Topics Concern  . Not on file  Social History Narrative   Lives with husband   Social Determinants of Health   Financial Resource Strain:   . Difficulty of Paying Living Expenses:   Food Insecurity:   . Worried About Charity fundraiser in the Last Year:   . Arboriculturist in the Last Year:   Transportation Needs:   . Film/video editor (Medical):   Marland Kitchen Lack of Transportation (Non-Medical):   Physical Activity:   . Days of Exercise per Week:   . Minutes of Exercise per Session:   Stress:   . Feeling of Stress :   Social Connections:   . Frequency of Communication with Friends and Family:   . Frequency of Social Gatherings with  Friends and Family:   . Attends Religious Services:   . Active Member of Clubs or Organizations:   . Attends Archivist Meetings:   Marland Kitchen Marital Status:   Intimate Partner Violence:   . Fear of Current or Ex-Partner:   . Emotionally Abused:   Marland Kitchen Physically Abused:   . Sexually Abused:      PHYSICAL EXAM  GENERAL EXAM/CONSTITUTIONAL: Vitals:  Vitals:   07/10/19 1346  BP: 112/75  Pulse: 98  Weight: 158 lb 12.8 oz (72 kg)  Height: 5\' 2"  (1.575 m)     Body mass index is 29.04 kg/m. Wt Readings from Last 3 Encounters:  07/10/19 158 lb 12.8 oz (72 kg)  06/27/19 158 lb 8 oz (71.9 kg)  05/20/19 161 lb (73 kg)     Patient is in no distress; well developed, nourished and groomed; neck is supple  CARDIOVASCULAR:  Examination of carotid arteries is normal; no carotid bruits  Regular rate and rhythm, no murmurs  Examination of peripheral vascular system by observation and palpation is normal  EYES:  RIGHT GLOBE PROSTHESIS  LEFT EYE PUPIL REACTIVE  No exam data present  MUSCULOSKELETAL:  Gait, strength, tone, movements noted in Neurologic exam below  NEUROLOGIC: MENTAL STATUS:  No flowsheet data found.  awake, alert, oriented to person, place and time  recent and remote memory intact  normal attention and concentration  language fluent, comprehension intact, naming intact  fund of knowledge appropriate  CRANIAL NERVE:   2nd - no papilledema on fundoscopic exam ON LEFT  2nd, 3rd, 4th, 6th - LEFT pupil reactive to light, LEFT EYE DECR VISION IN RIGHT INFERIOR QUADRANT; extraocular muscles intact ON LEFT, no nystagmus; RIGHT GLOBE PROSTHESIS  5th - facial sensation symmetric  7th - facial strength symmetric  8th - hearing intact  9th - palate elevates symmetrically, uvula midline  11th - shoulder shrug symmetric  12th - tongue protrusion midline  MOTOR:   normal bulk and tone, full strength in the BUE, BLE  SENSORY:   normal and  symmetric to light touch, temperature, vibration  COORDINATION:   finger-nose-finger, fine finger movements normal  REFLEXES:   deep tendon reflexes present and symmetric  GAIT/STATION:   narrow based gait     DIAGNOSTIC DATA (LABS, IMAGING, TESTING) - I reviewed patient records, labs, notes, testing and imaging myself where available.  Lab Results  Component Value Date   WBC 7.0 05/22/2019   HGB 13.9 05/22/2019   HCT 41.2 05/22/2019   MCV 94.8 05/22/2019   PLT 272.0 05/22/2019      Component Value Date/Time   NA 141 05/22/2019 1011   NA 143 12/23/2016 0848   K 3.5 05/22/2019 1011   K 4.3 12/23/2016 0848   CL 100 05/22/2019 1011   CL 99 12/23/2016 0848   CO2 29 05/22/2019 1011   CO2 31 12/23/2016 0848   GLUCOSE 120 (H) 05/22/2019 1011   GLUCOSE 89 12/23/2016 0848   BUN 16 05/22/2019 1011   BUN 10 12/23/2016 0848   CREATININE 0.70 05/22/2019 1011   CREATININE 0.82 02/22/2019 0821   CREATININE 1.0 12/23/2016 0848   CALCIUM 9.4 05/22/2019 1011   CALCIUM 9.4 12/23/2016 0848   PROT 6.3 05/22/2019 1011   PROT 6.3 (L) 12/23/2016 0848   ALBUMIN 4.3 05/22/2019 1011   ALBUMIN 3.6 12/23/2016 0848   AST 23 05/22/2019 1011   AST 17 02/22/2019 0821   ALT 29 05/22/2019 1011   ALT 22 02/22/2019 0821   ALT 26 12/23/2016 0848   ALKPHOS 94 05/22/2019 1011   ALKPHOS 99 (H) 12/23/2016 0848   BILITOT 0.5 05/22/2019 1011   BILITOT 0.5 02/22/2019 0821   GFRNONAA >60 02/22/2019 0821   GFRAA >60 02/22/2019 0821   Lab Results  Component Value Date   CHOL 164 05/22/2019   HDL 62.60 05/22/2019   LDLCALC 84 05/22/2019   LDLDIRECT 126.9 12/21/2012   TRIG 90.0 05/22/2019   CHOLHDL 3 05/22/2019   Lab Results  Component Value Date   HGBA1C 6.1 05/23/2019   Lab Results  Component Value Date   VITAMINB12 489 05/17/2013   Lab Results  Component Value Date   TSH 3.06 05/22/2019     06/24/19 MRI brain 1. No acute intracranial abnormality. Specifically, no evidence of  acute infarct.  2. Age-advanced patchy white matter signal abnormality which is nonspecific but typically attributed to small vessel disease.  06/24/19 MRI orbits 1. Unremarkable noncontrast MRI appearance of the left orbit.  2. 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  06/24/19 TTE Injection of agitated saline contrast performed to evaluate for  possible shunt  The left ventricular size is normal.  Left ventricular systolic function is normal.  LV ejection fraction = 65-70%.  The left ventricular wall motion is normal.  Few bubbles seen after 6-7 cardiac cycles suggesting minimal  intrapulmonary shunt.  No significant valvular disease  The aortic sinus is normal size.  IVC size was normal.  There is trivial pericardial effusion.  There is no comparison study available.   06/23/19 CTA head / perfusion 1. No emergent finding.  2. No infarct by non contrast CT or perfusion.  3. Atherosclerosis without large vessel occlusion or flow limiting stenosis of major vessels.  4. 5 mm left MCA bifurcation aneurysm.  5. Emphysema.       ASSESSMENT AND PLAN  64 y.o. year old female here  with:  Dx:  1. Branch retinal artery occlusion of left eye       PLAN:  Left eye branch retinal artery occlusion (06/23/19): - continue aspirin 81mg , atorvastatin 80mg , BP control - follow up heart monitor results - stop triptans (not recommend due to stroke)  LEFT MCA ANEURYSM (58mm; asymptomatic unruptured) - repeat MRA head in 6 months  Orders Placed This Encounter  Procedures  . MR ANGIO HEAD WO CONTRAST    Return in about 6 months (around 01/09/2020).    Penni Bombard, MD 1/61/0960, 4:54 PM Certified in Neurology, Neurophysiology and Neuroimaging  [facility] [address], [place], [place] [address] [phone]

## 2019-07-10 NOTE — Patient Instructions (Signed)
Left eye branch retinal artery occlusion - continue aspirin 81mg , atorvastatin 80mg , BP control - follow up heart monitor results - stop triptans  LEFT MCA ANEURYSM (24mm; asymptomatic unruptured) - repeat MRA head in 6 months

## 2019-07-11 ENCOUNTER — Telehealth: Payer: Self-pay | Admitting: Diagnostic Neuroimaging

## 2019-07-11 NOTE — Telephone Encounter (Signed)
[employer] medicare order sent to [email or web address] auth they will reach out to the patient to schedule.

## 2019-07-17 ENCOUNTER — Encounter: Payer: Self-pay | Admitting: Cardiology

## 2019-07-17 NOTE — Progress Notes (Signed)
Cardiology Office Note   Date:  07/18/2019   ID:  Carol Quinn, DOB [birth year], MRN [id number]  PCP:  Carol Minium, MD  Cardiologist:   No primary care provider on file.  Referring:  Carol Minium, MD   Chief Complaint  Patient presents with  . Cerebrovascular Accident      History of Present Illness: Carol Quinn is a 64 y.o. female who is referred by Carol Minium, MD for evaluation of retinal artery occlusion..  She had chest pain in 2016 and she had a negative Lexiscan Myoview.  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  She is now lost 25% vision in her left eye.  She was at [facility] and I reviewed these records extensively.  This was not felt to be embolic.  There was no embolic source noted on echo which was essentially unremarkable.  This was transthoracic.  There is a monitor that she is turned in but I do not have these results.  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  She had mild to moderate calcified plaque in the left carotid.  She does not have any significant cardiovascular history.  She does not feel any palpitations, presyncope or syncope.  She occasionally has some dizziness when she is bending over and straightening back up.  She does not have chest pressure, neck or arm discomfort.  She does have some shortness of breath walking for exercise.  She does have a history of lung disease and wears oxygen at night.  She is not had any new weight gain or edema.   Past Medical History:  Diagnosis Date  . Acute bronchitis   . Allergic rhinitis   . Anxiety   . Aortic atherosclerosis ([place])   . Cancer ([place])   . Chronic headaches   . COPD (chronic obstructive pulmonary disease) (HCC)    emphysema  . Depression   . Emphysema, unspecified ([place])   . Exposure to TB    uncle-her PPD neg  . GERD (gastroesophageal reflux disease)   . Hepatic cyst    left  . Hypertension   . MDS (myelodysplastic syndrome), low grade ([place])  09/23/2016  . 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 RBBB (right bundle branch block with left anterior fascicular block)     Past Surgical History:  Procedure Laterality Date  . BREAST BIOPSY  2002  . COLONOSCOPY WITH PROPOFOL N/A 03/21/2017   Procedure: COLONOSCOPY WITH PROPOFOL;  Surgeon: Jerene Bears, MD;  [facility];  Service: Gastroenterology;  Laterality: N/A;  . NOSE SURGERY     "nasal deviation"  . TOTAL ABDOMINAL HYSTERECTOMY    . TRIGGER FINGER RELEASE Right 06/05/2018   Procedure: RIGHT THUMB RELEASE TRIGGER FINGER/A-1 PULLEY;  Surgeon: Daryll Brod, MD;  Location: [facility];  Service: Orthopedics;  Laterality: Right;  . TUBAL LIGATION       Current Outpatient Medications  Medication Sig Dispense Refill  . albuterol (PROVENTIL) (2.5 MG/3ML) 0.083% nebulizer solution Take 3 mLs (2.5 mg total) by nebulization every 6 (six) hours as needed for wheezing or shortness of breath. 150 mL 11  . aspirin 81 MG EC tablet Take by mouth.    Marland Kitchen atorvastatin (LIPITOR) 40 MG tablet Take 40 mg by mouth daily.    Marland Kitchen buPROPion (WELLBUTRIN XL) 150 MG 24 hr tablet TAKE 1 TABLET BY MOUTH DAILY 30 tablet 6  . dorzolamide (TRUSOPT) 2 %  ophthalmic solution Apply to eye.    . escitalopram (LEXAPRO) 20 MG tablet TAKE 1 TABLET BY MOUTH DAILY 30 tablet 6  . estradiol (VIVELLE-DOT) 0.05 MG/24HR patch Place 0.05 mg onto the skin.    . fexofenadine (ALLEGRA) 180 MG tablet Take 180 mg by mouth daily.    . Fluticasone-Umeclidin-Vilant (TRELEGY ELLIPTA) 100-62.5-25 MCG/INH AEPB Inhale 1 puff into the lungs daily. 60 each 11  . LYSINE PO Take by mouth daily.    . modafinil (PROVIGIL) 200 MG tablet Take 1 tablet (200 mg total) by mouth daily. 30 tablet 5  . montelukast (SINGULAIR) 10 MG tablet TAKE 1 TABLET BY MOUTH DAILY AT BEDTIME 30 tablet prn  . naphazoline-pheniramine (NAPHCON-A) 0.025-0.3 % ophthalmic solution Apply to eye.    . Omega-3 Fatty Acids (FISH OIL) 1200 MG  CAPS Take by mouth.    Marland Kitchen omeprazole (PRILOSEC) 40 MG capsule TAKE 1 CAPSULE BY MOUTH DAILY 30 capsule 6  . PREVIDENT 5000 BOOSTER PLUS 1.1 % PSTE     . Respiratory Therapy Supplies (FLUTTER) DEVI Use as directed 1 each 0  . UNABLE TO FIND Med Name: Focus factor    . valsartan-hydrochlorothiazide (DIOVAN-HCT) 160-25 MG tablet TAKE 1 TABLET BY MOUTH DAILY 90 tablet 1   No current facility-administered medications for this visit.    Allergies:   Clarithromycin, Hydrocodone-homatropine, Hydrocodone-homatropine, Penicillins, Topiramate, and Codeine    Social History:  The patient  reports that she quit smoking about 19 months ago. Her smoking use included cigarettes. She has a 20.00 pack-year smoking history. She has never used smokeless tobacco. She reports current alcohol use. She reports that she does not use drugs.   Family History:  The patient's family history includes Arthritis in her maternal grandmother; Asthma in her maternal uncle and sister; Cancer in her maternal grandmother, sister, and other family members; Colon cancer in an other family member; Emphysema in her mother; Heart attack (age of onset: 11) in her mother; Heart failure (age of onset: 34) in her father.    ROS:  Please see the history of present illness.   Otherwise, review of systems are positive for none.   All other systems are reviewed and negative.    PHYSICAL EXAM: VS:  BP 122/76   Pulse 73   Ht 5\' 2"  (1.575 m)   Wt 154 lb (69.9 kg)   SpO2 95%   BMI 28.17 kg/m  , BMI Body mass index is 28.17 kg/m. GENERAL:  Well appearing HEENT:  Pupils equal round and reactive, fundi not visualized, oral mucosa unremarkable NECK:  No jugular venous distention, waveform within normal limits, carotid upstroke brisk and symmetric, no bruits, no thyromegaly LYMPHATICS:  No cervical, inguinal adenopathy LUNGS:  Clear to auscultation bilaterally BACK:  No CVA tenderness CHEST:  Unremarkable HEART:  PMI not displaced or  sustained,S1 and S2 within normal limits, no S3, no S4, no clicks, no rubs, no murmurs ABD:  Flat, positive bowel sounds normal in frequency in pitch, no bruits, no rebound, no guarding, no midline pulsatile mass, no hepatomegaly, no splenomegaly EXT:  2 plus pulses upper and decreased dorsalis pedis and posterior tibialis bilaterally, no edema, no cyanosis no clubbing SKIN:  No rashes no nodules NEURO:  Cranial nerves II through XII grossly intact, motor grossly intact throughout PSYCH:  Cognitively intact, oriented to person place and time    EKG:  EKG is ordered today. The ekg ordered today demonstrates sinus rhythm, right bundle branch block (old), no acute ST-T  wave changes.   Recent Labs: 05/22/2019: ALT 29; BUN 16; Creatinine, Ser 0.70; Hemoglobin 13.9; Platelets 272.0; Potassium 3.5; Sodium 141; TSH 3.06    Lipid Panel    Component Value Date/Time   CHOL 164 05/22/2019 1011   TRIG 90.0 05/22/2019 1011   HDL 62.60 05/22/2019 1011   CHOLHDL 3 05/22/2019 1011   VLDL 18.0 05/22/2019 1011   LDLCALC 84 05/22/2019 1011   LDLDIRECT 126.9 12/21/2012 1148      Wt Readings from Last 3 Encounters:  07/18/19 154 lb (69.9 kg)  07/10/19 158 lb 12.8 oz (72 kg)  06/27/19 158 lb 8 oz (71.9 kg)      Other studies Reviewed: Additional studies/ records that were reviewed today include: Extensive review of [facility] records and review of images.  ( (Greater than 40 minutes reviewing all data with greater than 50% face to face with the patient).. Review of the above records demonstrates:  Please see elsewhere in the note.     ASSESSMENT AND PLAN:  LEFT RETINAL ARTERY OCCLUSION: Looking at the extensive records this is likely atheroembolic.  Towards that end she needs blood pressure control and control of her lipids and lifestyle intervention.  I will be looking for the results of the monitor that was done at [facility].  She is going to be looking forward as well.  She is also going to get me a  history of her nephew who apparently has a clotting disorder so I can see what that is.  For now she will remain on the meds as listed.  LEFT MCA ANEURYSM:  She has follow up MRA scheduled in six months.    LEG PAIN: She has some leg pain and decreased pulses and so will be getting ABIs.  DYSLIPIDEMIA: She had her statin increased recently.  The last LDL was 84 but not reflective of this increase.  I will repeat a lipid profile when she comes back in a couple of months.  HTN: Blood pressure is at target.  Current medicines are reviewed at length with the patient today.  The patient does not have concerns regarding medicines.  The following changes have been made:  no change  Labs/ tests ordered today include: None  Orders Placed This Encounter  Procedures  . EKG 12-Lead  . VAS [place] ABI WITH/WO TBI  . VAS [place] LOWER EXTREMITY ARTERIAL DUPLEX     Disposition:   FU with me in two months.     Signed, Minus Breeding, MD  07/18/2019 1:47 PM    [facility]

## 2019-07-18 ENCOUNTER — Encounter: Payer: Self-pay | Admitting: Cardiology

## 2019-07-18 ENCOUNTER — Ambulatory Visit (INDEPENDENT_AMBULATORY_CARE_PROVIDER_SITE_OTHER): Payer: Medicare Other | Admitting: Cardiology

## 2019-07-18 ENCOUNTER — Other Ambulatory Visit: Payer: Self-pay

## 2019-07-18 VITALS — BP 122/76 | HR 73 | Ht 62.0 in | Wt 154.0 lb

## 2019-07-18 DIAGNOSIS — M79605 Pain in left leg: Secondary | ICD-10-CM

## 2019-07-18 DIAGNOSIS — H349 Unspecified retinal vascular occlusion: Secondary | ICD-10-CM | POA: Diagnosis not present

## 2019-07-18 DIAGNOSIS — M79604 Pain in right leg: Secondary | ICD-10-CM

## 2019-07-18 NOTE — Patient Instructions (Signed)
Medication Instructions:  Your physician recommends that you continue on your current medications as directed. Please refer to the Current Medication list given to you today.  *If you need a refill on your cardiac medications before your next appointment, please call your pharmacy*  Lab Work: NONE   Testing/Procedures: Your physician has requested that you have an ankle brachial index (ABI). During this test an ultrasound and blood pressure cuff are used to evaluate the arteries that supply the arms and legs with blood. Allow thirty minutes for this exam. There are no restrictions or special instructions.  Your physician has requested that you have a lower or upper extremity arterial duplex. This test is an ultrasound of the arteries in the legs or arms. It looks at arterial blood flow in the legs and arms. Allow one hour for Lower and Upper Arterial scans. There are no restrictions or special instructions  Follow-Up: At [facility], you and your health needs are our priority.  As part of our continuing mission to provide you with exceptional heart care, we have created designated Provider Care Teams.  These Care Teams include your primary Cardiologist (physician) and Advanced Practice Providers (APPs -  Physician Assistants and Nurse Practitioners) who all work together to provide you with the care you need, when you need it.  We recommend signing up for the patient portal called "MyChart".  Sign up information is provided on this After Visit Summary.  MyChart is used to connect with patients for Virtual Visits (Telemedicine).  Patients are able to view lab/test results, encounter notes, upcoming appointments, etc.  Non-urgent messages can be sent to your provider as well.   To learn more about what you can do with MyChart, go to [email or web address].    Your next appointment:   2 month(s)  The format for your next appointment:   In Person  Provider:   You may see DR Gwenlyn Found  or one of  the following Advanced Practice Providers on your designated Care Team:    Rosaria Ferries, PA-C  Jory Sims, DNP, ANP  Cadence Kathlen Mody, NP

## 2019-07-19 ENCOUNTER — Encounter: Payer: Self-pay | Admitting: Family Medicine

## 2019-07-19 ENCOUNTER — Ambulatory Visit
Admission: EM | Admit: 2019-07-19 | Discharge: 2019-07-19 | Disposition: A | Discharge status: Home / Self Care | Payer: Medicare Other

## 2019-07-19 NOTE — ED Notes (Signed)
Patient decided not to stay for visit.  Will discharge.

## 2019-07-19 NOTE — Telephone Encounter (Signed)
Patient would need an appointment to assess further to determine if there is something that can be removed in office or if a surgeon would be needed.

## 2019-07-22 ENCOUNTER — Other Ambulatory Visit: Payer: Self-pay | Admitting: Family Medicine

## 2019-07-30 ENCOUNTER — Other Ambulatory Visit: Payer: Self-pay | Admitting: Cardiology

## 2019-07-30 DIAGNOSIS — R0989 Other specified symptoms and signs involving the circulatory and respiratory systems: Secondary | ICD-10-CM

## 2019-08-13 ENCOUNTER — Other Ambulatory Visit: Payer: Self-pay

## 2019-08-13 ENCOUNTER — Ambulatory Visit (HOSPITAL_COMMUNITY)
Admission: RE | Admit: 2019-08-13 | Discharge: 2019-08-13 | Disposition: A | Discharge status: Home / Self Care | Payer: Medicare Other | Source: Ambulatory Visit | Attending: Internal Medicine | Admitting: Internal Medicine

## 2019-08-13 DIAGNOSIS — R0989 Other specified symptoms and signs involving the circulatory and respiratory systems: Secondary | ICD-10-CM | POA: Diagnosis present

## 2019-08-16 ENCOUNTER — Telehealth: Payer: Self-pay | Admitting: *Deleted

## 2019-08-16 NOTE — Telephone Encounter (Signed)
-----   Message from Minus Breeding, MD sent at 08/16/2019  7:59 AM EDT ----- Negative ABIs.  No evidence of peripheral vascular disease.  Call Ms. Luckey with the results and send results to Midge Minium, MD

## 2019-08-16 NOTE — Telephone Encounter (Signed)
-----   Message from Minus Breeding, MD sent at 08/16/2019  7:54 AM EDT ----- No evidence of atrial fib which is what I was looking for.  Essentially unremarkable monitor.  Thanks. ----- Message ----- From: Earvin Hansen, LPN Sent: 07/12/5668   1:45 PM EDT To: Minus Breeding, MD  Dr Percival Spanish  Ms [facility] sent you monitor results in mychart Thanks Linton Hall

## 2019-08-16 NOTE — Telephone Encounter (Signed)
Advised patient, verbalized understanding  

## 2019-08-19 ENCOUNTER — Other Ambulatory Visit: Payer: Self-pay | Admitting: Internal Medicine

## 2019-08-22 ENCOUNTER — Other Ambulatory Visit: Payer: Self-pay

## 2019-08-22 DIAGNOSIS — C6931 Malignant neoplasm of right choroid: Secondary | ICD-10-CM

## 2019-08-23 ENCOUNTER — Ambulatory Visit (HOSPITAL_BASED_OUTPATIENT_CLINIC_OR_DEPARTMENT_OTHER)
Admission: RE | Admit: 2019-08-23 | Discharge: 2019-08-23 | Disposition: A | Discharge status: Home / Self Care | Payer: Medicare Other | Source: Ambulatory Visit | Attending: Hematology & Oncology | Admitting: Hematology & Oncology

## 2019-08-23 ENCOUNTER — Other Ambulatory Visit: Payer: Self-pay | Admitting: Hematology & Oncology

## 2019-08-23 ENCOUNTER — Encounter: Payer: Self-pay | Admitting: Hematology & Oncology

## 2019-08-23 ENCOUNTER — Other Ambulatory Visit: Payer: Self-pay

## 2019-08-23 ENCOUNTER — Inpatient Hospital Stay: Payer: Medicare Other

## 2019-08-23 ENCOUNTER — Inpatient Hospital Stay: Payer: Medicare Other | Attending: Hematology & Oncology

## 2019-08-23 ENCOUNTER — Inpatient Hospital Stay (HOSPITAL_BASED_OUTPATIENT_CLINIC_OR_DEPARTMENT_OTHER): Payer: Medicare Other | Admitting: Hematology & Oncology

## 2019-08-23 VITALS — BP 148/68 | HR 102 | Temp 97.7°F | Resp 22 | Wt 161.5 lb

## 2019-08-23 DIAGNOSIS — C6931 Malignant neoplasm of right choroid: Secondary | ICD-10-CM | POA: Diagnosis not present

## 2019-08-23 DIAGNOSIS — Z79899 Other long term (current) drug therapy: Secondary | ICD-10-CM | POA: Insufficient documentation

## 2019-08-23 DIAGNOSIS — J449 Chronic obstructive pulmonary disease, unspecified: Secondary | ICD-10-CM | POA: Insufficient documentation

## 2019-08-23 DIAGNOSIS — Z7982 Long term (current) use of aspirin: Secondary | ICD-10-CM | POA: Diagnosis not present

## 2019-08-23 DIAGNOSIS — H34232 Retinal artery branch occlusion, left eye: Secondary | ICD-10-CM | POA: Diagnosis not present

## 2019-08-23 DIAGNOSIS — Z85840 Personal history of malignant neoplasm of eye: Secondary | ICD-10-CM | POA: Insufficient documentation

## 2019-08-23 LAB — CMP (CANCER CENTER ONLY)
ALT: 30 U/L (ref 0–44)
AST: 21 U/L (ref 15–41)
Albumin: 4.2 g/dL (ref 3.5–5.0)
Alkaline Phosphatase: 86 U/L (ref 38–126)
Anion gap: 10 (ref 5–15)
BUN: 15 mg/dL (ref 8–23)
CO2: 32 mmol/L (ref 22–32)
Calcium: 9.5 mg/dL (ref 8.9–10.3)
Chloride: 102 mmol/L (ref 98–111)
Creatinine: 0.74 mg/dL (ref 0.44–1.00)
GFR, Est AFR Am: >60 mL/min (ref >60)
GFR, Estimated: >60 mL/min (ref >60)
Glucose, Bld: 112 mg/dL — ABNORMAL HIGH (ref 70–99)
Potassium: 3.6 mmol/L (ref 3.5–5.1)
Sodium: 144 mmol/L (ref 135–145)
Total Bilirubin: 0.5 mg/dL (ref 0.3–1.2)
Total Protein: 6.3 g/dL — ABNORMAL LOW (ref 6.5–8.1)

## 2019-08-23 LAB — CBC WITH DIFFERENTIAL (CANCER CENTER ONLY)
Abs Immature Granulocytes: 0.04 10*3/uL (ref 0.00–0.07)
Basophils Absolute: 0.1 10*3/uL (ref 0.0–0.1)
Basophils Relative: 1 %
Eosinophils Absolute: 0.2 10*3/uL (ref 0.0–0.5)
Eosinophils Relative: 3 %
HCT: 39.6 % (ref 36.0–46.0)
Hemoglobin: 13.4 g/dL (ref 12.0–15.0)
Immature Granulocytes: 1 %
Lymphocytes Relative: 22 %
Lymphs Abs: 1.7 10*3/uL (ref 0.7–4.0)
MCH: 33.2 pg (ref 26.0–34.0)
MCHC: 33.8 g/dL (ref 30.0–36.0)
MCV: 98 fL (ref 80.0–100.0)
Monocytes Absolute: 0.7 10*3/uL (ref 0.1–1.0)
Monocytes Relative: 9 %
Neutro Abs: 4.9 10*3/uL (ref 1.7–7.7)
Neutrophils Relative %: 64 %
Platelet Count: 287 10*3/uL (ref 150–400)
RBC: 4.04 MIL/uL (ref 3.87–5.11)
RDW: 12 % (ref 11.5–15.5)
WBC Count: 7.7 10*3/uL (ref 4.0–10.5)
nRBC: 0 % (ref 0.0–0.2)

## 2019-08-23 LAB — ANTITHROMBIN III: AntiThromb III Func: 105 % (ref 75–120)

## 2019-08-23 LAB — LACTATE DEHYDROGENASE: LDH: 174 U/L (ref 98–192)

## 2019-08-23 MED ORDER — IOHEXOL 300 MG/ML  SOLN
100.0000 mL | Freq: Once | INTRAMUSCULAR | Status: AC | PRN
  Administered 2019-08-23: 100 mL via INTRAVENOUS

## 2019-08-23 NOTE — Progress Notes (Signed)
Hematology and Oncology Follow Up Visit  Carol Quinn [id number] 02-04-1955 64 y.o. 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  This was in June.  She woke up 1 morning.  She had difficulty seeing out of the left eye.  She has the right ocular prosthetic.  She ultimately was found to have a clot in a branch retinal artery vein in the left eye.  I am not sure exactly how this could have happened.  She had cholesterol studies just done back in May and her cholesterol levels were very good.  She had a high HDL.  She is on baby aspirin right now.  She had her cholesterol medicine increased.  She is being followed closely by the ophthalmologist out it [facility].  Think she is seeing a neurologist.  She had a very thorough work-up.  She had the standard stroke work-up.  Again everything turned out relatively okay.  There is no obvious history of thromboembolic disease in her family.  However, we probably do have to do a hypercoagulable work-up with her.  We did do a CT scan for surveillance of her melanoma.  This was done today.  There is no evidence of recurrence of the ocular melanoma.  She has had no change in bowel or bladder habits.  There has been no exacerbations of her underlying COPD despite the hot humid air.  She has had no fever.  She has had no nausea or vomiting.  Currently, her performance status is ECOG 1.  Medications:  Allergies as of 08/23/2019      Reactions   Clarithromycin Diarrhea, Rash, Other (See Comments)   Severe diarrhea  Rash Other reaction(s): DIARRHEA   Hydrocodone-homatropine Other (See Comments)   paranoia    Hydrocodone-homatropine Other (See Comments)   paranoia   Penicillins Other (See Comments)   Unknown reaction, childhood allergy Has patient had a PCN reaction causing immediate rash, facial/tongue/throat swelling, SOB or lightheadedness with hypotension: Unknown Has patient had a PCN reaction causing severe rash involving mucus membranes or skin necrosis: Unknown Has patient had a PCN reaction that required hospitalization: Unknown Has patient had a PCN reaction occurring within the last 10 years: No If all of the above answers are "NO", then may proceed with Cephalosporin use. unknown Unknown reaction, childhood allergy   Topiramate Other (See Comments)   WEIGHT LOSS Other reaction(s): WEIGHT LOSS WEIGHT LOSS   Codeine Itching      Medication List       Accurate as of August 23, 2019  9:27 AM. If you have any questions, ask your nurse or doctor.        albuterol (2.5 MG/3ML) 0.083% nebulizer solution Commonly known as: PROVENTIL Take 3 mLs (2.5 mg total) by nebulization every 6 (six) hours as needed for wheezing or shortness of breath.   aspirin 81 MG EC tablet Take by mouth.   atorvastatin 40 MG tablet Commonly known as: LIPITOR Take 40 mg by mouth daily.   buPROPion 150 MG 24 hr tablet Commonly known as: WELLBUTRIN XL TAKE 1 TABLET BY MOUTH DAILY   dorzolamide 2 % ophthalmic solution Commonly known as: TRUSOPT Apply to eye.  escitalopram 20 MG tablet Commonly known as: LEXAPRO TAKE 1 TABLET BY MOUTH DAILY   estradiol 0.05 MG/24HR patch Commonly known as: VIVELLE-DOT Place 0.05 mg onto the skin.   fexofenadine 180 MG tablet Commonly known as: ALLEGRA Take 180 mg by mouth daily.   Fish Oil 1200 MG Caps Take by mouth.   Flutter Devi Use as directed   LYSINE PO Take by mouth daily.   modafinil 200 MG tablet Commonly known as: PROVIGIL Take 1 tablet (200 mg total) by mouth daily.   montelukast 10 MG tablet Commonly known as: SINGULAIR TAKE 1  TABLET BY MOUTH ONCE DAILY AT BEDTIME   naphazoline-pheniramine 0.025-0.3 % ophthalmic solution Commonly known as: NAPHCON-A Apply to eye.   omeprazole 40 MG capsule Commonly known as: PRILOSEC TAKE 1 CAPSULE BY MOUTH DAILY   PreviDent 5000 Booster Plus 1.1 % Pste Generic drug: Sodium Fluoride   Trelegy Ellipta 100-62.5-25 MCG/INH Aepb Generic drug: Fluticasone-Umeclidin-Vilant Inhale 1 puff into the lungs daily.   UNABLE TO FIND Med Name: Focus factor   valsartan-hydrochlorothiazide 160-25 MG tablet Commonly known as: DIOVAN-HCT TAKE 1 TABLET BY MOUTH DAILY       Allergies:  Allergies  Allergen Reactions  . Clarithromycin Diarrhea, Rash and Other (See Comments)    Severe diarrhea  Rash Other reaction(s): DIARRHEA  . Hydrocodone-Homatropine Other (See Comments)    paranoia  . Hydrocodone-Homatropine Other (See Comments)    paranoia  . Penicillins Other (See Comments)    Unknown reaction, childhood allergy Has patient had a PCN reaction causing immediate rash, facial/tongue/throat swelling, SOB or lightheadedness with hypotension: Unknown Has patient had a PCN reaction causing severe rash involving mucus membranes or skin necrosis: Unknown Has patient had a PCN reaction that required hospitalization: Unknown Has patient had a PCN reaction occurring within the last 10 years: No If all of the above answers are "NO", then may proceed with Cephalosporin use.  unknown Unknown reaction, childhood allergy  . Topiramate Other (See Comments)    WEIGHT LOSS Other reaction(s): WEIGHT LOSS WEIGHT LOSS  . Codeine Itching    Past Medical History, Surgical history, Social history, and Family History were reviewed and updated.  Review of Systems: Review of Systems  Constitutional: Negative.   HENT: Negative.   Eyes: Negative.   Respiratory: Negative.   Cardiovascular: Negative.   Gastrointestinal: Positive for abdominal pain.  Genitourinary: Negative.     Musculoskeletal: Negative.   Skin: Negative.   Neurological: Negative.   Endo/Heme/Allergies: Negative.   Psychiatric/Behavioral: Negative.       Physical Exam:  weight is 161 lb 8 oz (73.3 kg). Her oral temperature is 97.7 F (36.5 C). Her blood pressure is 148/68 (abnormal) and her pulse is 102 (abnormal). Her respiration is 22 (abnormal) and oxygen saturation is 94%.   Wt Readings from Last 3 Encounters:  08/23/19 161 lb 8 oz (73.3 kg)  07/18/19 154 lb (69.9 kg)  07/10/19 158 lb 12.8 oz (72 kg)    Physical Exam Vitals reviewed.  HENT:     Head: Normocephalic and atraumatic.  Eyes:     Pupils: Pupils are equal, round, and reactive to light.     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  Left eye is unremarkable.  She has good extraocular muscle movement of the left eye.  Cardiovascular:     Rate and Rhythm: Normal rate and regular rhythm.     Heart sounds: Normal heart sounds.  Pulmonary:     Effort:  Pulmonary effort is normal.     Breath sounds: Normal breath sounds.  Abdominal:     General: Bowel sounds are normal.     Palpations: Abdomen is soft.  Musculoskeletal:        General: No tenderness or deformity. Normal range of motion.     Cervical back: Normal range of motion.  Lymphadenopathy:     Cervical: No cervical adenopathy.  Skin:    General: Skin is warm and dry.     Findings: No erythema or rash.  Neurological:     Mental Status: She is alert and oriented to person, place, and time.  Psychiatric:        Behavior: Behavior normal.        Thought Content: Thought content normal.        Judgment: Judgment normal.      Lab Results  Component Value Date   WBC 7.7 08/23/2019   HGB 13.4 08/23/2019   HCT 39.6 08/23/2019   MCV 98.0 08/23/2019   PLT 287 08/23/2019   No results found for: FERRITIN, IRON, TIBC, UIBC, IRONPCTSAT Lab Results  Component Value Date   RBC 4.04 08/23/2019   No results found for: KPAFRELGTCHN, LAMBDASER,  KAPLAMBRATIO No results found for: IGGSERUM, IGA, IGMSERUM No results found for: Odetta Pink, SPEI   Chemistry      Component Value Date/Time   NA 144 08/23/2019 0748   NA 143 12/23/2016 0848   K 3.6 08/23/2019 0748   K 4.3 12/23/2016 0848   CL 102 08/23/2019 0748   CL 99 12/23/2016 0848   CO2 32 08/23/2019 0748   CO2 31 12/23/2016 0848   BUN 15 08/23/2019 0748   BUN 10 12/23/2016 0848   CREATININE 0.74 08/23/2019 0748   CREATININE 1.0 12/23/2016 0848      Component Value Date/Time   CALCIUM 9.5 08/23/2019 0748   CALCIUM 9.4 12/23/2016 0848   ALKPHOS 86 08/23/2019 0748   ALKPHOS 99 (H) 12/23/2016 0848   AST 21 08/23/2019 0748   ALT 30 08/23/2019 0748   ALT 26 12/23/2016 0848   BILITOT 0.5 08/23/2019 0748      Impression and Plan: Carol Quinn is a very pleasant 64 yo caucasian female with a choroidal melanoma of the right eye with right eye enucleation in October 2017.   I am just surprised that she had this left ocular event.  Again, we will do a thrombophilic work-up on her.  If she does have a hypercoagulable condition, we will clearly have to get her on anticoagulation.  The CT scan is quite reassuring that there is no problem with respect to her ocular melanoma.  It is now been almost 4 years that she had the enucleation of the right eye.  We still have to follow up with her ocular melanoma.  Still need a surveillance CT scans to be done.  I would like to get her back in a couple months however given this change in her overall status.  We will certainly get her back sooner if we do find any issues with her thrombophilic work-up.   [name], MD 8/13/20219:27 AM

## 2019-08-24 LAB — PROTEIN C, TOTAL: Protein C, Total: 126 % (ref 60–150)

## 2019-08-24 LAB — CARDIOLIPIN ANTIBODIES, IGG, IGM, IGA
Anticardiolipin IgA: <9 APL U/mL (ref 0–11)
Anticardiolipin IgG: <9 GPL U/mL (ref 0–14)
Anticardiolipin IgM: 14 MPL U/mL — ABNORMAL HIGH (ref 0–12)

## 2019-08-25 LAB — LUPUS ANTICOAGULANT PANEL
DRVVT: 39 s (ref 0.0–47.0)
PTT Lupus Anticoagulant: 34.2 s (ref 0.0–51.9)

## 2019-08-25 LAB — BETA-2-GLYCOPROTEIN I ABS, IGG/M/A
Beta-2 Glyco I IgG: <9 GPI IgG units (ref 0–20)
Beta-2-Glycoprotein I IgA: <9 GPI IgA units (ref 0–25)
Beta-2-Glycoprotein I IgM: <9 GPI IgM units (ref 0–32)

## 2019-08-25 LAB — PROTEIN S, TOTAL: Protein S Ag, Total: 104 % (ref 60–150)

## 2019-08-25 LAB — PROTEIN S ACTIVITY: Protein S Activity: 108 % (ref 63–140)

## 2019-08-25 LAB — PROTEIN C ACTIVITY: Protein C Activity: 140 % (ref 73–180)

## 2019-08-26 LAB — HOMOCYSTEINE: Homocysteine: 6.6 umol/L (ref 0.0–17.2)

## 2019-08-28 LAB — FACTOR 5 LEIDEN

## 2019-08-29 ENCOUNTER — Encounter: Payer: Self-pay | Admitting: Hematology & Oncology

## 2019-08-29 ENCOUNTER — Encounter: Payer: Self-pay | Admitting: *Deleted

## 2019-08-29 LAB — PROTHROMBIN GENE MUTATION

## 2019-09-05 ENCOUNTER — Encounter: Payer: Self-pay | Admitting: Hematology & Oncology

## 2019-09-19 NOTE — Progress Notes (Signed)
Cardiology Office Note   Date:  09/20/2019   ID:  Carol Quinn, DOB 05-23-1955, MRN [id number]  PCP:  Midge Minium, MD Cardiologist:  Minus Breeding, MD 07/18/2019 Electrphysiologist: None Carol Ferries, Carol Quinn   No chief complaint on file.   History of Present Illness: Carol Quinn is a 64 y.o. 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 Neurology at [facility] at the [facility]. No evidence of embolic source on MRA neck or carotid ultrasound. Their A/P stated BRAO OS. No significant stenosis seen on CTA. No AIS on MRI brain. ASA and statin. Zio on DC. - Echo indicative of possible minor shunt, aortic arch atheroma. She had mild to moderate calcified plaque in the left carotid. 07/08 office visit, repeat lipids in 2 months, BP ok 08/13 Dr Marin Olp visit, labs for ?hypercoag done >>negative  Carol Quinn presents for cardiology follow up.  She wonders if the vaccine caused the problem, 2nd dose was about 10 weeks before the retinal artery occlusion happened.   Her nephew had a massive stroke at age 32, her sister thinks he has a clotting disorder, but pt cannot find out for sure.  She has an Apple watch, will get her son to help her see if it checks her HR.   She gets chest discomfort. Last episode this week. Starts at rest, lasts less than a minute. No palpitations with these. No hx CP w/ physical exertion.   She is under some emotional stress from her husband's declining health. She also feels stressed because of having to have 6 surgeries on her eye (changed to [facility]).  Her breathing is never very good, she has severe emphysema. She cannot walk 200 feet without stopping for breath. She had gotten better, but after her CVA, she had not exercised in a  while, wants to get back to walking. When she runs out of air, she has to stop and breathe. She has home O2 and a portable device. She is concerned because she does not want to get COVID thru her machine.    COVID status: vaccinated, did not have COVID Past Medical History:  Diagnosis Date  . Acute bronchitis   . Allergic rhinitis   . Anxiety   . Aortic atherosclerosis ([place])   . Cancer ([place])   . Chronic headaches   . COPD (chronic obstructive pulmonary disease) (HCC)    emphysema  . Depression   . Emphysema, unspecified ([place])   . Exposure to TB    uncle-her PPD neg  . GERD (gastroesophageal reflux disease)   . Hepatic cyst    left  . Hypertension   . MDS (myelodysplastic syndrome), low grade ([place]) 09/23/2016  . 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 RBBB (right bundle branch block with left anterior fascicular block)     Past Surgical History:  Procedure Laterality Date  . BREAST BIOPSY  2002  . COLONOSCOPY WITH PROPOFOL N/A 03/21/2017   Procedure: COLONOSCOPY WITH PROPOFOL;  Surgeon: Jerene Bears, MD;  Location: [facility];  Service: Gastroenterology;  Laterality: N/A;  . NOSE SURGERY     "nasal deviation"  . TOTAL ABDOMINAL HYSTERECTOMY    . TRIGGER FINGER RELEASE Right 06/05/2018   Procedure: RIGHT THUMB RELEASE TRIGGER FINGER/A-1 PULLEY;  Surgeon: Daryll Brod, MD;  Location: [facility];  Service: Orthopedics;  Laterality: Right;  . TUBAL LIGATION      Current Outpatient Medications  Medication Sig Dispense Refill  . albuterol (PROVENTIL) (2.5 MG/3ML) 0.083% nebulizer solution Take 3 mLs (2.5 mg total) by nebulization every 6 (six) hours as needed for wheezing or shortness of breath. 150 mL 11  . aspirin 81 MG EC tablet Take by mouth.    Marland Kitchen atorvastatin (LIPITOR) 40 MG tablet Take 40 mg by mouth daily.    Marland Kitchen buPROPion (WELLBUTRIN XL) 150 MG 24 hr tablet TAKE 1 TABLET BY MOUTH DAILY 30 tablet 6  . dorzolamide (TRUSOPT) 2 % ophthalmic solution  Apply to eye.    . escitalopram (LEXAPRO) 20 MG tablet TAKE 1 TABLET BY MOUTH DAILY 30 tablet 6  . estradiol (VIVELLE-DOT) 0.05 MG/24HR patch Place 0.05 mg onto the skin.    . fexofenadine (ALLEGRA) 180 MG tablet Take 180 mg by mouth daily.    . Fluticasone-Umeclidin-Vilant (TRELEGY ELLIPTA) 100-62.5-25 MCG/INH AEPB Inhale 1 puff into the lungs daily. 60 each 11  . LYSINE PO Take by mouth daily.    . modafinil (PROVIGIL) 200 MG tablet Take 1 tablet (200 mg total) by mouth daily. 30 tablet 5  . montelukast (SINGULAIR) 10 MG tablet TAKE 1 TABLET BY MOUTH ONCE DAILY AT BEDTIME 30 tablet 3  . naphazoline-pheniramine (NAPHCON-A) 0.025-0.3 % ophthalmic solution Apply to eye.    . Omega-3 Fatty Acids (FISH OIL) 1200 MG CAPS Take by mouth.    Marland Kitchen omeprazole (PRILOSEC) 40 MG capsule TAKE 1 CAPSULE BY MOUTH DAILY 30 capsule 6  . PREVIDENT 5000 BOOSTER PLUS 1.1 % PSTE     . Respiratory Therapy Supplies (FLUTTER) DEVI Use as directed 1 each 0  . UNABLE TO FIND Med Name: Focus factor    . valsartan-hydrochlorothiazide (DIOVAN-HCT) 160-25 MG tablet TAKE 1 TABLET BY MOUTH DAILY 90 tablet 1   No current facility-administered medications for this visit.    Allergies:   Clarithromycin, Hydrocodone-homatropine, Hydrocodone-homatropine, Penicillins, Topiramate, and Codeine    Social History:  The patient  reports that she quit smoking about 21 months ago. Her smoking use included cigarettes. She has a 20.00 pack-year smoking history. She has never used smokeless tobacco. She reports current alcohol use. She reports that she does not use drugs.   Family History:  The patient's family history includes Arthritis in her maternal grandmother; Asthma in her maternal uncle and sister; Cancer in her maternal grandmother, sister, and other family members; Colon cancer in an other family member; Emphysema in her mother; Heart attack (age of onset: 6) in her mother; Heart failure (age of onset: 1) in her father.  She  indicated that her mother is deceased. She indicated that her father is deceased. She indicated that both of her sisters are alive. She indicated that her brother is alive. She indicated that the status of her maternal grandmother is unknown. She indicated that the status of her maternal uncle is unknown. She indicated that only one of her three others is alive.   ROS:  Please see the history of present illness. All other systems are reviewed and negative.    PHYSICAL EXAM: VS:  BP 132/76   Pulse 85   Ht 5\' 2"  (1.575 m)   Wt 158 lb 9.6 oz (71.9 kg)   SpO2 97%   BMI 29.01 kg/m  , BMI Body mass index is 29.01 kg/m. GEN: Well nourished, well developed, female in no acute distress  HEENT: normal for age  Neck: no JVD, no carotid bruit, no masses Cardiac: RRR; no murmur, no rubs, or gallops Respiratory:  clear to auscultation bilaterally, normal work of breathing GI: soft, nontender, nondistended, + BS MS: no deformity or atrophy; no edema; distal pulses are 2+ in all 4 extremities  Skin: warm and dry, no rash Neuro:  Strength and sensation are intact Psych: euthymic mood, full affect   EKG:  EKG is not ordered today.   ECHO: 06/24/2019 SUMMARY  Injection of agitated saline contrast performed to evaluate for  possible shunt  The left ventricular size is normal.  Left ventricular systolic function is normal.  LV ejection fraction = 65-70%.  The left ventricular wall motion is normal.  Few bubbles seen after 6-7 cardiac cycles suggesting minimal  intrapulmonary shunt.  No significant valvular disease  The aortic sinus is normal size.  IVC size was normal.  There is trivial pericardial effusion.  There is no comparison study available.   -  FINDINGS:  LEFT VENTRICLE  The left ventricular size is normal. There is normal left ventricular  wall thickness. LV ejection fraction = 65-70%. Left ventricular  systolic function is normal. Left ventricular filling pattern is  normal.  The left ventricular wall motion is normal.   -  RIGHT VENTRICLE  The right ventricle is normal size. The right ventricular systolic  function is normal.   LEFT ATRIUM  The left atrial size is normal.   RIGHT ATRIUM  Right atrial size is normal. A few bubbles seen after 6-7 cardiac  cycles, likely no interatrial shunt.  -  AORTIC VALVE  Structurally normal aortic valve. There is no aortic stenosis. There  is no aortic regurgitation.  -  MITRAL VALVE  The mitral valve leaflets appear normal. There is trace mitral  regurgitation.  -  TRICUSPID VALVE  Structurally normal tricuspid valve. There is trace tricuspid  regurgitation.  -  PULMONIC VALVE  The pulmonic valve is not well visualized. There is no pulmonic  valvularregurgitation.  -  ARTERIES  The aortic sinus is normal size.  -  VENOUS  Pulmonary venous flow pattern is normal. IVC size was normal.  -  EFFUSION  There is trivial pericardial effusion.  -  -   MMode/2D Measurements & Calculations  IVSd: 0.79 cm   LA diam: 3.1 cm Ao sinus diam:  LVOT diam:  LVIDd: 4.0 cm    RVDd: 2.9 cm   2.7 cm      1.7 cm  LVPWd: 0.94 cm  LVIDs: 2.1 cm      MYOIVIEW: 04/14/2014 Normal  Recent Labs: 05/22/2019: TSH 3.06 08/23/2019: ALT 30; BUN 15; Creatinine 0.74; Hemoglobin 13.4; Platelet Count 287; Potassium 3.6; Sodium 144  CBC      Component Value Date/Time   WBC 7.7 08/23/2019 0748   WBC 7.0 05/22/2019 1011   RBC 4.04 08/23/2019 0748   HGB 13.4 08/23/2019 0748   HGB 14.3 12/23/2016 0848   HCT 39.6 08/23/2019 0748   HCT 42.4 12/23/2016 0848   PLT 287 08/23/2019 0748   PLT 263 12/23/2016 0848   MCV 98.0 08/23/2019 0748   MCV 98 12/23/2016 0848   MCH 33.2 08/23/2019 0748   MCHC 33.8 08/23/2019 0748   RDW 12.0 08/23/2019 0748   RDW 12.5 12/23/2016 0848   LYMPHSABS 1.7 08/23/2019 0748   LYMPHSABS 1.8 12/23/2016 0848   MONOABS 0.7 08/23/2019 0748   EOSABS 0.2 08/23/2019 0748   EOSABS 0.3  12/23/2016 0848  BASOSABS 0.1 08/23/2019 0748   BASOSABS 0.1 12/23/2016 0848   CMP Latest Ref Rng & Units 08/23/2019 05/22/2019 02/22/2019  Glucose 70 - 99 mg/dL 112(H) 120(H) 113(H)  BUN 8 - 23 mg/dL 15 16 15   Creatinine 0.44 - 1.00 mg/dL 0.74 0.70 0.82  Sodium 135 - 145 mmol/L 144 141 146(H)  Potassium 3.5 - 5.1 mmol/L 3.6 3.5 4.4  Chloride 98 - 111 mmol/L 102 100 103  CO2 22 - 32 mmol/L 32 29 33(H)  Calcium 8.9 - 10.3 mg/dL 9.5 9.4 10.0  Total Protein 6.5 - 8.1 g/dL 6.3(L) 6.3 6.7  Total Bilirubin 0.3 - 1.2 mg/dL 0.5 0.5 0.5  Alkaline Phos 38 - 126 U/L 86 94 75  AST 15 - 41 U/L 21 23 17   ALT 0 - 44 U/L 30 29 22      Lipid Panel Lab Results  Component Value Date   CHOL 164 05/22/2019   HDL 62.60 05/22/2019   LDLCALC 84 05/22/2019   LDLDIRECT 126.9 12/21/2012   TRIG 90.0 05/22/2019   CHOLHDL 3 05/22/2019      Wt Readings from Last 3 Encounters:  09/20/19 158 lb 9.6 oz (71.9 kg)  08/23/19 161 lb 8 oz (73.3 kg)  07/18/19 154 lb (69.9 kg)     Other studies Reviewed: Additional studies/ records that were reviewed today include: Office notes, [facility] records and testing.  ASSESSMENT AND PLAN:  1.  Retinal artery occlusion - pt is able to drive - no further problems - nephew had a massive CVA at age 67, cause not clear to her, she will look into this. - hypercoag wkup negative - Zio negative - on ASA 81 mg qd - pt to follow HR on her watch, she will let us know of any inappropriate HRs >> if she has these, she needs ILR  2. Chest pain, uncertain etiology - pt not able to do treadmill - prev MV nl - no exertional component - we discussed sx to watch out for - I do not wish to increase her lifetime radiation exposure so will not order anything now - if she has additional sx >> cardiac CT  3. DOE - chronic and no recent change - no HF on exam - encouraged her to wear O2 if this will help her increase activity  4. abnl echo - minimal intrapulmonary shunt seen  on bubble study - EF nl - MD advise if any further workup indicated    Current medicines are reviewed at length with the patient today.  The patient has concerns regarding medicines. Concerns were addressed  The following changes have been made:  no change  Labs/ tests ordered today include:  No orders of the defined types were placed in this encounter.    Disposition:   FU with Minus Breeding, MD  Signed, Carol Ferries, Carol Quinn  09/20/2019 2:02 PM    [place] Phone: [phone]; Fax: [phone]

## 2019-09-20 ENCOUNTER — Other Ambulatory Visit: Payer: Self-pay

## 2019-09-20 ENCOUNTER — Ambulatory Visit (INDEPENDENT_AMBULATORY_CARE_PROVIDER_SITE_OTHER): Payer: Medicare Other | Admitting: Physician Assistant

## 2019-09-20 ENCOUNTER — Encounter: Payer: Self-pay | Admitting: Physician Assistant

## 2019-09-20 VITALS — BP 132/76 | HR 85 | Ht 62.0 in | Wt 158.6 lb

## 2019-09-20 DIAGNOSIS — R0609 Other forms of dyspnea: Secondary | ICD-10-CM

## 2019-09-20 DIAGNOSIS — R079 Chest pain, unspecified: Secondary | ICD-10-CM | POA: Diagnosis not present

## 2019-09-20 DIAGNOSIS — R0600 Dyspnea, unspecified: Secondary | ICD-10-CM

## 2019-09-20 DIAGNOSIS — H349 Unspecified retinal vascular occlusion: Secondary | ICD-10-CM

## 2019-09-20 NOTE — Patient Instructions (Signed)
Medication Instructions:  Your physician recommends that you continue on your current medications as directed. Please refer to the Current Medication list given to you today.  *If you need a refill on your cardiac medications before your next appointment, please call your pharmacy*   Follow-Up: At [facility], you and your health needs are our priority.  As part of our continuing mission to provide you with exceptional heart care, we have created designated Provider Care Teams.  These Care Teams include your primary Cardiologist (physician) and Advanced Practice Providers (APPs -  Physician Assistants and Nurse Practitioners) who all work together to provide you with the care you need, when you need it.  We recommend signing up for the patient portal called "MyChart".  Sign up information is provided on this After Visit Summary.  MyChart is used to connect with patients for Virtual Visits (Telemedicine).  Patients are able to view lab/test results, encounter notes, upcoming appointments, etc.  Non-urgent messages can be sent to your provider as well.   To learn more about what you can do with MyChart, go to [email or web address].    Your next appointment:   6 month(s)  The format for your next appointment:   In Person  Provider:   You may see Minus Breeding, MD or one of the following Advanced Practice Providers on your designated Care Team:    Rosaria Ferries, PA-C  Jory Sims, DNP, ANP  Cadence Kathlen Mody, PA-C    Other Instructions Please call our office 2 months in advance to schedule your follow-up appointment with Dr. Percival Spanish.  Please call if your symptoms of chest pain/palpitations worsens (possibl Cardiac CT or Implantable Monitor Device.  Please try to increase your activity and wear oxygen as directed.

## 2019-10-04 ENCOUNTER — Encounter: Payer: Self-pay | Admitting: Family Medicine

## 2019-10-04 MED ORDER — BUPROPION HCL ER (XL) 300 MG PO TB24
300.0000 mg | ORAL_TABLET | Freq: Every day | ORAL | 3 refills | Status: DC
Start: 2019-10-04 — End: 2019-11-21

## 2019-10-11 ENCOUNTER — Inpatient Hospital Stay: Payer: Medicare Other

## 2019-10-11 ENCOUNTER — Other Ambulatory Visit: Payer: Self-pay

## 2019-10-11 ENCOUNTER — Telehealth: Payer: Self-pay | Admitting: Hematology & Oncology

## 2019-10-11 ENCOUNTER — Encounter: Payer: Self-pay | Admitting: Hematology & Oncology

## 2019-10-11 ENCOUNTER — Inpatient Hospital Stay: Payer: Medicare Other | Attending: Hematology & Oncology | Admitting: Hematology & Oncology

## 2019-10-11 VITALS — BP 136/58 | HR 78 | Temp 98.1°F | Resp 19 | Wt 157.0 lb

## 2019-10-11 DIAGNOSIS — C6931 Malignant neoplasm of right choroid: Secondary | ICD-10-CM | POA: Diagnosis not present

## 2019-10-11 DIAGNOSIS — H34232 Retinal artery branch occlusion, left eye: Secondary | ICD-10-CM

## 2019-10-11 DIAGNOSIS — J449 Chronic obstructive pulmonary disease, unspecified: Secondary | ICD-10-CM | POA: Insufficient documentation

## 2019-10-11 DIAGNOSIS — Z85840 Personal history of malignant neoplasm of eye: Secondary | ICD-10-CM | POA: Insufficient documentation

## 2019-10-11 DIAGNOSIS — I671 Cerebral aneurysm, nonruptured: Secondary | ICD-10-CM

## 2019-10-11 DIAGNOSIS — Z8673 Personal history of transient ischemic attack (TIA), and cerebral infarction without residual deficits: Secondary | ICD-10-CM | POA: Insufficient documentation

## 2019-10-11 DIAGNOSIS — Z79899 Other long term (current) drug therapy: Secondary | ICD-10-CM | POA: Insufficient documentation

## 2019-10-11 LAB — CBC WITH DIFFERENTIAL (CANCER CENTER ONLY)
Abs Immature Granulocytes: 0.03 10*3/uL (ref 0.00–0.07)
Basophils Absolute: 0.1 10*3/uL (ref 0.0–0.1)
Basophils Relative: 1 %
Eosinophils Absolute: 0.2 10*3/uL (ref 0.0–0.5)
Eosinophils Relative: 3 %
HCT: 41.5 % (ref 36.0–46.0)
Hemoglobin: 13.6 g/dL (ref 12.0–15.0)
Immature Granulocytes: 0 %
Lymphocytes Relative: 25 %
Lymphs Abs: 1.9 10*3/uL (ref 0.7–4.0)
MCH: 32.4 pg (ref 26.0–34.0)
MCHC: 32.8 g/dL (ref 30.0–36.0)
MCV: 98.8 fL (ref 80.0–100.0)
Monocytes Absolute: 0.8 10*3/uL (ref 0.1–1.0)
Monocytes Relative: 11 %
Neutro Abs: 4.6 10*3/uL (ref 1.7–7.7)
Neutrophils Relative %: 60 %
Platelet Count: 291 10*3/uL (ref 150–400)
RBC: 4.2 MIL/uL (ref 3.87–5.11)
RDW: 12.1 % (ref 11.5–15.5)
WBC Count: 7.5 10*3/uL (ref 4.0–10.5)
nRBC: 0 % (ref 0.0–0.2)

## 2019-10-11 LAB — CMP (CANCER CENTER ONLY)
ALT: 34 U/L (ref 0–44)
AST: 21 U/L (ref 15–41)
Albumin: 4.2 g/dL (ref 3.5–5.0)
Alkaline Phosphatase: 90 U/L (ref 38–126)
Anion gap: 7 (ref 5–15)
BUN: 15 mg/dL (ref 8–23)
CO2: 35 mmol/L — ABNORMAL HIGH (ref 22–32)
Calcium: 9.8 mg/dL (ref 8.9–10.3)
Chloride: 102 mmol/L (ref 98–111)
Creatinine: 0.75 mg/dL (ref 0.44–1.00)
GFR, Est AFR Am: >60 mL/min (ref >60)
GFR, Estimated: >60 mL/min (ref >60)
Glucose, Bld: 118 mg/dL — ABNORMAL HIGH (ref 70–99)
Potassium: 4 mmol/L (ref 3.5–5.1)
Sodium: 144 mmol/L (ref 135–145)
Total Bilirubin: 0.4 mg/dL (ref 0.3–1.2)
Total Protein: 6.4 g/dL — ABNORMAL LOW (ref 6.5–8.1)

## 2019-10-11 LAB — LACTATE DEHYDROGENASE: LDH: 142 U/L (ref 98–192)

## 2019-10-11 NOTE — Progress Notes (Signed)
Hematology and Oncology Follow Up Visit  Carol Quinn [id number] Jan 23, 1955 64 y.o. 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  Overall, she has been doing okay.  I want her to come in little bit early because she had this stroke in the left eye back only saw her in August.  She actually had the stroke back in June.  We did a hypercoagulable work-up on her.  We could not find any problem.  Think she goes back to see the neurologist in November.  Para she still is having some headaches.  She says that she lies down when she gets these.  She has had no issues with weakness.  There is no tingling or numbness in the extremities.  She has had no problems with bowels or bladder.  There has been no rashes.  She does have some dry skin.  She has some keratoses.  Her lungs are doing a little bit better with the cooler air.  She does have underlying COPD.  She has had no exacerbations.   Currently, her performance status is ECOG 1.  Medications:  Allergies as of 10/11/2019      Reactions   Clarithromycin Diarrhea, Rash, Other (See Comments)   Severe diarrhea  Rash Other reaction(s): DIARRHEA   Hydrocodone-homatropine Other (See Comments)   paranoia   Hydrocodone-homatropine Other (See Comments)   paranoia   Penicillins Other (See Comments)   Unknown reaction, childhood allergy Has patient had a PCN reaction causing immediate rash, facial/tongue/throat swelling, SOB or lightheadedness with hypotension: Unknown Has patient had a PCN reaction causing severe rash involving mucus membranes or skin necrosis: Unknown Has patient had a PCN reaction that required hospitalization: Unknown Has patient had a PCN reaction occurring within the last 10  years: No If all of the above answers are "NO", then may proceed with Cephalosporin use. unknown Unknown reaction, childhood allergy   Topiramate Other (See Comments)   WEIGHT LOSS Other reaction(s): WEIGHT LOSS WEIGHT LOSS   Codeine Itching      Medication List       Accurate as of October 11, 2019  9:24 AM. If you have any questions, ask your nurse or doctor.        albuterol (2.5 MG/3ML) 0.083% nebulizer solution Commonly known as: PROVENTIL Take 3 mLs (2.5 mg total) by nebulization every 6 (six) hours as needed for wheezing or shortness of breath.   atorvastatin 40 MG tablet Commonly known as: LIPITOR Take 40 mg by mouth daily.   buPROPion 300 MG 24 hr tablet Commonly known as: Wellbutrin XL Take 1 tablet (300 mg total) by mouth daily.   dorzolamide 2 % ophthalmic solution Commonly known as: TRUSOPT Apply to eye.   escitalopram 20 MG tablet Commonly known as: LEXAPRO TAKE 1 TABLET BY MOUTH DAILY   estradiol 0.05 MG/24HR patch Commonly known as: VIVELLE-DOT Place 0.05 mg onto the skin.   fexofenadine 180 MG tablet Commonly known as: ALLEGRA Take 180 mg by mouth daily.   Fish Oil 1200 MG Caps Take by mouth.   Flutter Devi Use as directed   LYSINE PO Take by mouth daily.   modafinil 200 MG tablet Commonly known as: PROVIGIL Take 1 tablet (200 mg total) by mouth daily.   montelukast 10 MG tablet  Commonly known as: SINGULAIR TAKE 1 TABLET BY MOUTH ONCE DAILY AT BEDTIME   naphazoline-pheniramine 0.025-0.3 % ophthalmic solution Commonly known as: NAPHCON-A Apply to eye.   omeprazole 40 MG capsule Commonly known as: PRILOSEC TAKE 1 CAPSULE BY MOUTH DAILY   PreviDent 5000 Booster Plus 1.1 % Pste Generic drug: Sodium Fluoride   Trelegy Ellipta 100-62.5-25 MCG/INH Aepb Generic drug: Fluticasone-Umeclidin-Vilant Inhale 1 puff into the lungs daily.   UNABLE TO FIND Med Name: Focus factor   valsartan-hydrochlorothiazide 160-25 MG tablet Commonly  known as: DIOVAN-HCT TAKE 1 TABLET BY MOUTH DAILY       Allergies:  Allergies  Allergen Reactions  . Clarithromycin Diarrhea, Rash and Other (See Comments)    Severe diarrhea  Rash Other reaction(s): DIARRHEA  . Hydrocodone-Homatropine Other (See Comments)    paranoia  . Hydrocodone-Homatropine Other (See Comments)    paranoia  . Penicillins Other (See Comments)    Unknown reaction, childhood allergy Has patient had a PCN reaction causing immediate rash, facial/tongue/throat swelling, SOB or lightheadedness with hypotension: Unknown Has patient had a PCN reaction causing severe rash involving mucus membranes or skin necrosis: Unknown Has patient had a PCN reaction that required hospitalization: Unknown Has patient had a PCN reaction occurring within the last 10 years: No If all of the above answers are "NO", then may proceed with Cephalosporin use.  unknown Unknown reaction, childhood allergy  . Topiramate Other (See Comments)    WEIGHT LOSS Other reaction(s): WEIGHT LOSS WEIGHT LOSS  . Codeine Itching    Past Medical History, Surgical history, Social history, and Family History were reviewed and updated.  Review of Systems: Review of Systems  Constitutional: Negative.   HENT: Negative.   Eyes: Negative.   Respiratory: Negative.   Cardiovascular: Negative.   Gastrointestinal: Positive for abdominal pain.  Genitourinary: Negative.   Musculoskeletal: Negative.   Skin: Negative.   Neurological: Negative.   Endo/Heme/Allergies: Negative.   Psychiatric/Behavioral: Negative.       Physical Exam:  weight is 157 lb (71.2 kg). Her oral temperature is 98.1 F (36.7 C). Her blood pressure is 136/58 (abnormal) and her pulse is 78. Her respiration is 19 and oxygen saturation is 96%.   Wt Readings from Last 3 Encounters:  10/11/19 157 lb (71.2 kg)  09/20/19 158 lb 9.6 oz (71.9 kg)  08/23/19 161 lb 8 oz (73.3 kg)    Physical Exam Vitals reviewed.  HENT:     Head:  Normocephalic and atraumatic.  Eyes:     Pupils: Pupils are equal, round, and reactive to light.     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  Left eye is unremarkable.  She has good extraocular muscle movement of the left eye.  Cardiovascular:     Rate and Rhythm: Normal rate and regular rhythm.     Heart sounds: Normal heart sounds.  Pulmonary:     Effort: Pulmonary effort is normal.     Breath sounds: Normal breath sounds.  Abdominal:     General: Bowel sounds are normal.     Palpations: Abdomen is soft.  Musculoskeletal:        General: No tenderness or deformity. Normal range of motion.     Cervical back: Normal range of motion.  Lymphadenopathy:     Cervical: No cervical adenopathy.  Skin:    General: Skin is warm and dry.     Findings: No erythema or rash.  Neurological:     Mental Status: She is alert  and oriented to person, place, and time.  Psychiatric:        Behavior: Behavior normal.        Thought Content: Thought content normal.        Judgment: Judgment normal.      Lab Results  Component Value Date   WBC 7.5 10/11/2019   HGB 13.6 10/11/2019   HCT 41.5 10/11/2019   MCV 98.8 10/11/2019   PLT 291 10/11/2019   No results found for: FERRITIN, IRON, TIBC, UIBC, IRONPCTSAT Lab Results  Component Value Date   RBC 4.20 10/11/2019   No results found for: KPAFRELGTCHN, LAMBDASER, KAPLAMBRATIO No results found for: IGGSERUM, IGA, IGMSERUM No results found for: Odetta Pink, SPEI   Chemistry      Component Value Date/Time   NA 144 08/23/2019 0748   NA 143 12/23/2016 0848   K 3.6 08/23/2019 0748   K 4.3 12/23/2016 0848   CL 102 08/23/2019 0748   CL 99 12/23/2016 0848   CO2 32 08/23/2019 0748   CO2 31 12/23/2016 0848   BUN 15 08/23/2019 0748   BUN 10 12/23/2016 0848   CREATININE 0.74 08/23/2019 0748   CREATININE 1.0 12/23/2016 0848      Component Value Date/Time   CALCIUM 9.5  08/23/2019 0748   CALCIUM 9.4 12/23/2016 0848   ALKPHOS 86 08/23/2019 0748   ALKPHOS 99 (H) 12/23/2016 0848   AST 21 08/23/2019 0748   ALT 30 08/23/2019 0748   ALT 26 12/23/2016 0848   BILITOT 0.5 08/23/2019 0748      Impression and Plan: Ms. 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   I still not sure as to what happened with respect to the left eye back in June.  I am not sure why she would have had a ocular stroke.  As far as the ocular melanoma goes of the right eye, we have to follow up with another CT scan in February 2022.  Hopefully, she will do well with the holiday season.  She is always so active.  She really just hates not having to be able to do much because of the COVID-19.    She asked about the booster vaccine for the COVID.  I told her that I felt that the benefits outweigh the risks for her.  Given her underlying COPD, if she were to get COVID, it could certainly affect her lungs significantly.  [name], MD 10/1/20219:24 AM

## 2019-10-11 NOTE — Telephone Encounter (Signed)
Per 10/1 los appointments scheduled calendar was provided & contrast was given to patient for her CT that is to be scheduled for 02/13/2020 prior to seeing MD same day.

## 2019-10-18 ENCOUNTER — Other Ambulatory Visit: Payer: Self-pay | Admitting: Family Medicine

## 2019-10-30 ENCOUNTER — Telehealth: Payer: Self-pay | Admitting: Internal Medicine

## 2019-10-30 ENCOUNTER — Encounter: Payer: Self-pay | Admitting: Internal Medicine

## 2019-10-30 ENCOUNTER — Other Ambulatory Visit: Payer: Self-pay

## 2019-10-30 ENCOUNTER — Ambulatory Visit (INDEPENDENT_AMBULATORY_CARE_PROVIDER_SITE_OTHER): Payer: Medicare Other | Admitting: Internal Medicine

## 2019-10-30 DIAGNOSIS — J449 Chronic obstructive pulmonary disease, unspecified: Secondary | ICD-10-CM

## 2019-10-30 DIAGNOSIS — J9611 Chronic respiratory failure with hypoxia: Secondary | ICD-10-CM | POA: Diagnosis not present

## 2019-10-30 DIAGNOSIS — Z23 Encounter for immunization: Secondary | ICD-10-CM

## 2019-10-30 NOTE — Patient Instructions (Signed)
Order- flu vax standard  I will fill out your forms and we will call when they are ready  Please call if we can help

## 2019-10-30 NOTE — Progress Notes (Signed)
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  Seasonal pollen rhinitis, drainage with mild sore throat.  Oncology continues to follow her closely with no melanoma mets. CT chest 02/22/19- IMPRESSION: Lungs/Pleura: Biapical pleural-parenchymal scarring, left greater than right. Moderate centrilobular and paraseptal emphysematous changes, upper lung predominant. No suspicious pulmonary nodules. No focal consolidation. No pleural effusion or pneumothorax. No evidence of metastatic disease in the chest, abdomen, or pelvis. Aortic Atherosclerosis (ICD10-I70.0) and Emphysema (ICD10-J43.9).  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 Going to [employer] No acute changes in breathing. Still easily DOE, worse with bending, lifting,hills and stairs. Needs HC Parking and brings that and disability form for me to fill out.  Reviewed recent CT. Still followed by Oncology.   CT-chest 08/23/19 IMPRESSION: No evidence of metastatic disease in the chest, abdomen, or pelvis. Aortic Atherosclerosis (ICD10-I70.0) and Emphysema (ICD10-J43.9).   ROS-see HPI   + = positive Constitutional:   No-   weight loss, night sweats, fevers, chills, +fatigue, lassitude. HEENT:   No-  headaches, difficulty swallowing, tooth/dental problems, sore throat,       No-  sneezing, itching, ear ache, +nasal congestion, +post nasal drip,  CV:  No-   chest pain, orthopnea, PND, swelling in lower extremities, anasarca, dizziness, palpitations Resp: + shortness of breath with exertion or at rest.                productive cough,  + non-productive cough,  No- coughing up of blood.               change in color of mucus.  + wheezing.   Skin: No-   rash or lesions. GI:  No-   heartburn, indigestion, abdominal pain, nausea, vomiting,  GU:  MS:  No-   joint pain or swelling.   Neuro-     nothing unusual Psych:  No- change in mood or affect. No depression or anxiety.  No memory loss.  OBJ- Physical Exam    +Room air here today General- Alert, Oriented, Affect-appropriate,  + overweight Skin- clear  Lymphadenopathy- none Head- atraumatic            Eyes- + R prosthesis            Ears- Hearing, canals-normal            Nose- clear,  no-Septal dev, mucus, polyps, erosion, perforation             Throat- Mallampati IV , mucosa clear/ +minimally red , drainage- none, tonsils- small residual Neck- flexible , trachea midline, no stridor , thyroid nl, carotid no bruit Chest -  symmetrical excursion , unlabored           Heart/CV- RRR , no murmur , no gallop  , no rub, nl s1 s2                           - JVD- none , edema- none, stasis changes- none, varices- none           Lung-  +distant/ clear, Wheeze- none, cough-none , dullness-none, rub- none,             Chest wall-  Abd-  Br/ Gen/ Rectal- Not done, not indicated Extrem- cyanosis- none, clubbing, none, atrophy- none, strength- nl Neuro- grossly intact to observation

## 2019-10-30 NOTE — Telephone Encounter (Signed)
Medication name and strength: Modafinil 200mg  Provider: [place]: [facility] Patient insurance ID: [id number] Phone: [phone] Fax: [phone]  Was the PA started on CMM?  yes If yes, please enter the Key: [id number] Timeframe for approval/denial: Outcome Approvedtoday Request Reference Number: [id number]. MODAFINIL TAB 200MG  is approved through 04/29/2020. Your patient may now fill this prescription and it will be covered.   Attempted to call pt to let her know this info but unable to reach. Left pt a detailed message letting her know that the PA for modafinil was approved. Nothing further needed.

## 2019-11-01 ENCOUNTER — Telehealth: Payer: Self-pay | Admitting: Internal Medicine

## 2019-11-01 NOTE — Assessment & Plan Note (Signed)
Severe COPD, mostly emphysema Plan- continue current meds, flu vax. HC Parking. Totally and Permanently disabled by COPD and eyesight

## 2019-11-01 NOTE — Telephone Encounter (Signed)
I will have it on C-pod Monday for her to pick up at front desk.

## 2019-11-01 NOTE — Telephone Encounter (Signed)
Spoke with pt who is requesting status on handicap placard that was given to Dr. Annamaria Boots during [place] on 10/30/19.  Dr. Annamaria Boots could you please update on status of paperwork? Thank you

## 2019-11-01 NOTE — Assessment & Plan Note (Signed)
Continues to depend on O2 for sleep aand sustained exertion  2L

## 2019-11-02 NOTE — Telephone Encounter (Signed)
Called and spoke with patient to let her know that Dr. Annamaria Boots would have handicap placard ready for her on Monday. She expressed understanding. Nothing further needed at this time.

## 2019-11-12 ENCOUNTER — Other Ambulatory Visit: Payer: Self-pay | Admitting: Family Medicine

## 2019-11-12 ENCOUNTER — Other Ambulatory Visit: Payer: Self-pay | Admitting: Internal Medicine

## 2019-11-12 NOTE — Telephone Encounter (Signed)
Pt is requesting refill on MODAFINIL 200 MG TAB next ov 04/29/20

## 2019-11-12 NOTE — Telephone Encounter (Signed)
Modafinil refill e-sent

## 2019-11-13 ENCOUNTER — Telehealth: Payer: Self-pay | Admitting: Family Medicine

## 2019-11-13 NOTE — Telephone Encounter (Signed)
Left message for patient to schedule Annual Wellness Visit.  Please schedule with Nurse Health Advisor Martha Stanley, RN at [facility]  

## 2019-11-21 ENCOUNTER — Other Ambulatory Visit: Payer: Self-pay

## 2019-11-21 ENCOUNTER — Encounter: Payer: Self-pay | Admitting: Family Medicine

## 2019-11-21 ENCOUNTER — Ambulatory Visit (INDEPENDENT_AMBULATORY_CARE_PROVIDER_SITE_OTHER): Payer: Medicare Other | Admitting: Family Medicine

## 2019-11-21 VITALS — BP 128/77 | HR 89 | Temp 98.3°F | Resp 16 | Ht 62.0 in | Wt 157.0 lb

## 2019-11-21 DIAGNOSIS — I700 Atherosclerosis of aorta: Secondary | ICD-10-CM | POA: Diagnosis not present

## 2019-11-21 DIAGNOSIS — E559 Vitamin D deficiency, unspecified: Secondary | ICD-10-CM | POA: Diagnosis not present

## 2019-11-21 DIAGNOSIS — Z0000 Encounter for general adult medical examination without abnormal findings: Secondary | ICD-10-CM

## 2019-11-21 LAB — LIPID PANEL
Cholesterol: 145 mg/dL (ref 0–200)
HDL: 63.5 mg/dL (ref >39.00)
LDL Cholesterol: 57 mg/dL (ref 0–99)
NonHDL: 81.3
Total CHOL/HDL Ratio: 2
Triglycerides: 124 mg/dL (ref 0.0–149.0)
VLDL: 24.8 mg/dL (ref 0.0–40.0)

## 2019-11-21 LAB — BASIC METABOLIC PANEL
BUN: 21 mg/dL (ref 6–23)
CO2: 31 mEq/L (ref 19–32)
Calcium: 9.5 mg/dL (ref 8.4–10.5)
Chloride: 95 mEq/L — ABNORMAL LOW (ref 96–112)
Creatinine, Ser: 0.69 mg/dL (ref 0.40–1.20)
GFR: 92.71 mL/min (ref >60.00)
Glucose, Bld: 106 mg/dL — ABNORMAL HIGH (ref 70–99)
Potassium: 3.8 mEq/L (ref 3.5–5.1)
Sodium: 135 mEq/L (ref 135–145)

## 2019-11-21 LAB — CBC WITH DIFFERENTIAL/PLATELET
Basophils Absolute: 0.1 10*3/uL (ref 0.0–0.1)
Basophils Relative: 0.9 % (ref 0.0–3.0)
Eosinophils Absolute: 0 10*3/uL (ref 0.0–0.7)
Eosinophils Relative: 0.3 % (ref 0.0–5.0)
HCT: 41.7 % (ref 36.0–46.0)
Hemoglobin: 13.9 g/dL (ref 12.0–15.0)
Lymphocytes Relative: 7.1 % — ABNORMAL LOW (ref 12.0–46.0)
Lymphs Abs: 0.8 10*3/uL (ref 0.7–4.0)
MCHC: 33.4 g/dL (ref 30.0–36.0)
MCV: 96 fl (ref 78.0–100.0)
Monocytes Absolute: 0.6 10*3/uL (ref 0.1–1.0)
Monocytes Relative: 5 % (ref 3.0–12.0)
Neutro Abs: 9.8 10*3/uL — ABNORMAL HIGH (ref 1.4–7.7)
Neutrophils Relative %: 86.7 % — ABNORMAL HIGH (ref 43.0–77.0)
Platelets: 315 10*3/uL (ref 150.0–400.0)
RBC: 4.34 Mil/uL (ref 3.87–5.11)
RDW: 13 % (ref 11.5–15.5)
WBC: 11.3 10*3/uL — ABNORMAL HIGH (ref 4.0–10.5)

## 2019-11-21 LAB — HEPATIC FUNCTION PANEL
ALT: 26 U/L (ref 0–35)
AST: 12 U/L (ref 0–37)
Albumin: 4.4 g/dL (ref 3.5–5.2)
Alkaline Phosphatase: 102 U/L (ref 39–117)
Bilirubin, Direct: 0.1 mg/dL (ref 0.0–0.3)
Total Bilirubin: 0.7 mg/dL (ref 0.2–1.2)
Total Protein: 6.6 g/dL (ref 6.0–8.3)

## 2019-11-21 LAB — TSH: TSH: 2.71 u[IU]/mL (ref 0.35–4.50)

## 2019-11-21 LAB — VITAMIN D 25 HYDROXY (VIT D DEFICIENCY, FRACTURES): VITD: 28.86 ng/mL — ABNORMAL LOW (ref 30.00–100.00)

## 2019-11-21 MED ORDER — BUPROPION HCL ER (XL) 150 MG PO TB24
150.0000 mg | ORAL_TABLET | Freq: Every morning | ORAL | 1 refills | Status: DC
Start: 2019-11-21 — End: 2020-07-20

## 2019-11-21 NOTE — Assessment & Plan Note (Signed)
Check labs and replete prn. 

## 2019-11-21 NOTE — Progress Notes (Signed)
   Subjective:    Patient ID: Carol Quinn, female    DOB: [birth year], 64 y.o.   MRN: [id number]  HPI CPE- UTD on mammo, colonoscopy, Tdap, flu, COVID (including booster)  Reviewed past medical, surgical, family and social histories.   Patient Care Team    Relationship Specialty Notifications Start End  Midge Minium, MD PCP - General Family Medicine  07/17/10   Minus Breeding, MD PCP - Cardiology Cardiology  09/19/19   Lynder Parents., MD  Obstetrics and Gynecology  02/14/14   Hale Bogus., MD Referring Physician Gastroenterology  02/14/14   Iran Ouch, MD Referring Physician Ophthalmology  05/10/17   Midge Minium, MD Referring Physician Family Medicine  07/10/19     Health Maintenance  Topic Date Due  . Hepatitis C Screening  11/20/2020 (Originally 11/22/1955)  . HIV Screening  11/20/2020 (Originally [birth year]31/1970)  . MAMMOGRAM  12/23/2020  . TETANUS/TDAP  12/22/2022  . COLONOSCOPY  03/22/2027  . INFLUENZA VACCINE  Completed  . COVID-19 Vaccine  Completed      Review of Systems Patient reports no hearing changes, adenopathy,fever, weight change,  persistant/recurrent hoarseness , swallowing issues, chest pain, palpitations, edema, persistant/recurrent cough, hemoptysis, dyspnea (rest/exertional/paroxysmal nocturnal), gastrointestinal bleeding (melena, rectal bleeding), abdominal pain, significant heartburn, bowel changes, GU symptoms (dysuria, hematuria, incontinence), Gyn symptoms (abnormal  bleeding, pain),  syncope, focal weakness, memory loss, numbness & tingling, skin/hair/nail changes, abnormal bruising or bleeding, anxiety, or depression.  + vision issues- following at [facility]  This visit occurred during the SARS-CoV-2 public health emergency.  Safety protocols were in place, including screening questions prior to the visit, additional usage of staff PPE, and extensive cleaning of exam room while observing appropriate contact time as indicated for disinfecting  solutions.       Objective:   Physical Exam General Appearance:    Alert, cooperative, no distress, appears stated age  Head:    Normocephalic, without obvious abnormality, atraumatic  Eyes:    PERRL, conjunctiva/corneas clear, EOM's intact, fundi    Benign L eye  Ears:    Normal TM's and external ear canals, both ears  Nose:   Deferred due to COVID  Throat:   Neck:   Supple, symmetrical, trachea midline, no adenopathy;    Thyroid: no enlargement/tenderness/nodules  Back:     Symmetric, no curvature, ROM normal, no CVA tenderness  Lungs:     Clear to auscultation bilaterally, respirations unlabored  Chest Wall:    No tenderness or deformity   Heart:    Regular rate and rhythm, S1 and S2 normal, no murmur, rub   or gallop  Breast Exam:    Deferred to mammo  Abdomen:     Soft, non-tender, bowel sounds active all four quadrants,    no masses, no organomegaly  Genitalia:    Deferred to GYN  Rectal:    Extremities:   Extremities normal, atraumatic, no cyanosis or edema  Pulses:   2+ and symmetric all extremities  Skin:   Skin color, texture, turgor normal, no rashes or lesions  Lymph nodes:   Cervical, supraclavicular, and axillary nodes normal  Neurologic:   CNII-XII intact, normal strength, sensation and reflexes    throughout          Assessment & Plan:

## 2019-11-21 NOTE — Assessment & Plan Note (Signed)
Chronic problem.  Goal is lipid and BP control.  Will continue to follow.

## 2019-11-21 NOTE — Patient Instructions (Addendum)
Follow up in 6 months to recheck BP and cholesterol We'll notify you of your lab results and make any changes if needed Continue to work on healthy diet and regular exercise- you are doing great! Call with any questions or concerns Stay Safe!  Stay Healthy! Happy Holidays!

## 2019-11-21 NOTE — Assessment & Plan Note (Signed)
Pt's PE unchanged from previous.  UTD on mammo, colonoscopy, Tdap, flu, COVID.  Check labs.  Anticipatory guidance provided.

## 2019-11-22 ENCOUNTER — Encounter: Payer: Self-pay | Admitting: Family Medicine

## 2019-11-22 ENCOUNTER — Telehealth: Payer: Self-pay | Admitting: Family Medicine

## 2019-11-22 NOTE — Telephone Encounter (Signed)
Pt returned phone call back lab results.

## 2019-11-22 NOTE — Telephone Encounter (Signed)
Noted  

## 2019-11-26 ENCOUNTER — Other Ambulatory Visit: Payer: Self-pay | Admitting: Family Medicine

## 2019-11-26 MED ORDER — ALPRAZOLAM 0.5 MG PO TABS: 0.5000 mg | ORAL_TABLET | Freq: Two times a day (BID) | ORAL | 1 refills | Status: DC | PRN

## 2019-11-26 NOTE — Progress Notes (Signed)
Alprazolam sent to pharmacy for pt to use PRN

## 2019-11-28 NOTE — Progress Notes (Deleted)
Subjective:   Carol Quinn is a 64 y.o. female who presents for Medicare Annual (Subsequent) preventive examination.  I connected with [place] today by telephone and verified that I am speaking with the correct person using two identifiers. Location patient: home Location provider: work Persons participating in the virtual visit: patient, Marine scientist.    I discussed the limitations, risks, security and privacy concerns of performing an evaluation and management service by telephone and the availability of in person appointments. I also discussed with the patient that there may be a patient responsible charge related to this service. The patient expressed understanding and verbally consented to this telephonic visit.    Interactive audio and video telecommunications were attempted between this provider and patient, however failed, due to patient having technical difficulties OR patient did not have access to video capability.  We continued and completed visit with audio only.  Some vital signs may be absent or patient reported.   Time Spent with patient on telephone encounter: *** minutes   Review of Systems    ***       Objective:    There were no vitals filed for this visit. There is no height or weight on file to calculate BMI.  Advanced Directives 10/11/2019 08/23/2019 05/29/2018 08/21/2017 04/21/2017 03/21/2017 09/23/2016  Does Patient Have a Medical Advance Directive? Yes Yes Yes No No Yes No  Type of Paramedic of [place];Living will [employer];Living will Living will;Healthcare Power of [place] will -  Does patient want to make changes to medical advance directive? No - Patient declined No - Patient declined No - Patient declined - - - -  Copy of [place] in Chart? - No - copy requested No - copy requested - - - -    Current Medications (verified) Outpatient Encounter Medications as of 12/02/2019  Medication  Sig   albuterol (PROVENTIL) (2.5 MG/3ML) 0.083% nebulizer solution Take 3 mLs (2.5 mg total) by nebulization every 6 (six) hours as needed for wheezing or shortness of breath.   ALPRAZolam (XANAX) 0.5 MG tablet Take 1 tablet (0.5 mg total) by mouth 2 (two) times daily as needed for anxiety.   atorvastatin (LIPITOR) 40 MG tablet Take 40 mg by mouth daily.   buPROPion (WELLBUTRIN XL) 150 MG 24 hr tablet Take 1 tablet (150 mg total) by mouth every morning.   dorzolamide (TRUSOPT) 2 % ophthalmic solution Apply to eye.   escitalopram (LEXAPRO) 20 MG tablet TAKE 1 TABLET BY MOUTH DAILY   estradiol (VIVELLE-DOT) 0.05 MG/24HR patch Place 0.05 mg onto the skin.   fexofenadine (ALLEGRA) 180 MG tablet Take 180 mg by mouth daily.   Fluticasone-Umeclidin-Vilant (TRELEGY ELLIPTA) 100-62.5-25 MCG/INH AEPB Inhale 1 puff into the lungs daily.   LYSINE PO Take by mouth daily.   modafinil (PROVIGIL) 200 MG tablet TAKE 1 TABLET BY MOUTH DAILY   montelukast (SINGULAIR) 10 MG tablet TAKE 1 TABLET BY MOUTH ONCE DAILY AT BEDTIME   naphazoline-pheniramine (NAPHCON-A) 0.025-0.3 % ophthalmic solution Apply to eye.   Omega-3 Fatty Acids (FISH OIL) 1200 MG CAPS Take by mouth.   omeprazole (PRILOSEC) 40 MG capsule TAKE 1 CAPSULE BY MOUTH DAILY   PREVIDENT 5000 BOOSTER PLUS 1.1 % PSTE    Respiratory Therapy Supplies (FLUTTER) DEVI Use as directed   valsartan-hydrochlorothiazide (DIOVAN-HCT) 160-25 MG tablet TAKE 1 TABLET BY MOUTH DAILY   No facility-administered encounter medications on file as of 12/02/2019.    Allergies (verified)  Clarithromycin, Hydrocodone-homatropine, Hydrocodone-homatropine, Penicillins, Topiramate, and Codeine   History: Past Medical History:  Diagnosis Date   Acute bronchitis    Allergic rhinitis    Anxiety    Aortic atherosclerosis (HCC)    Cancer (HCC)    Chronic headaches    COPD (chronic obstructive pulmonary disease) (HCC)    emphysema   Depression     Emphysema, unspecified (HCC)    Exposure to TB    uncle-her PPD neg   GERD (gastroesophageal reflux disease)    Hepatic cyst    left   Hypertension    MDS (myelodysplastic syndrome), low grade ([place]) 09/23/2016   Ocular melanoma, right ([place])    has prosthesis   RBBB (right bundle branch block with left anterior fascicular block)    Past Surgical History:  Procedure Laterality Date   BREAST BIOPSY  2002   COLONOSCOPY WITH PROPOFOL N/A 03/21/2017   Procedure: COLONOSCOPY WITH PROPOFOL;  Surgeon: Jerene Bears, MD;  Location: [facility];  Service: Gastroenterology;  Laterality: N/A;   NOSE SURGERY     "nasal deviation"   TOTAL ABDOMINAL HYSTERECTOMY     TRIGGER FINGER RELEASE Right 06/05/2018   Procedure: RIGHT THUMB RELEASE TRIGGER FINGER/A-1 PULLEY;  Surgeon: Daryll Brod, MD;  Location: [facility];  Service: Orthopedics;  Laterality: Right;   TUBAL LIGATION     Family History  Problem Relation Age of Onset   Heart failure Father 55   Emphysema Mother    Heart attack Mother 44       Died age 40   Asthma Sister    Asthma Maternal Uncle    Arthritis Maternal Grandmother    Cancer Maternal Grandmother    Cancer Sister    Cancer Other        aunts   Cancer Other        uncles   Colon cancer Other    Social History   Socioeconomic History   Marital status: Married    Spouse name: Remo Lipps   Number of children: Not on file   Years of education: Not on file   Highest education level: Associate degree: academic program  Occupational History   Occupation: Database administrator    Comment: [employer]  Tobacco Use   Smoking status: Former Smoker    Packs/day: 0.50    Years: 40.00    Pack years: 20.00    Types: Cigarettes    Quit date: 11/27/2017    Years since quitting: 2.0   Smokeless tobacco: Never Used  Vaping Use   Vaping Use: Former  Substance and Sexual Activity   Alcohol use: Yes    Alcohol/week: 0.0  standard drinks    Comment: social   Drug use: No   Sexual activity: Not on file  Other Topics Concern   Not on file  Social History Narrative   Lives with husband.     Social Determinants of Health   Financial Resource Strain:    Difficulty of Paying Living Expenses: Not on file  Food Insecurity:    Worried About Charity fundraiser in the Last Year: Not on file   [employer] of Food in the Last Year: Not on file  Transportation Needs:    Lack of Transportation (Medical): Not on file   Lack of Transportation (Non-Medical): Not on file  Physical Activity:    Days of Exercise per Week: Not on file   Minutes of Exercise per Session: Not on file  Stress:  Feeling of Stress : Not on file  Social Connections:    Frequency of Communication with Friends and Family: Not on file   Frequency of Social Gatherings with Friends and Family: Not on file   Attends Religious Services: Not on file   Active Member of Clubs or Organizations: Not on file   Attends Archivist Meetings: Not on file   Marital Status: Not on file    Tobacco Counseling Counseling given: Not Answered   Clinical Intake:                 Diabetic?No         Activities of Daily Living In your present state of health, do you have any difficulty performing the following activities: 11/21/2019 06/27/2019  Hearing? N N  Vision? N N  Difficulty concentrating or making decisions? N N  Walking or climbing stairs? N N  Dressing or bathing? N N  Doing errands, shopping? N N  Some recent data might be hidden    Patient Care Team: Midge Minium, MD as PCP - General (Family Medicine) Minus Breeding, MD as PCP - Cardiology (Cardiology) Lynder Parents., MD (Obstetrics and Gynecology) Hale Bogus., MD as Referring Physician (Gastroenterology) Iran Ouch, MD as Referring Physician (Ophthalmology) Midge Minium, MD as Referring Physician (Family  Medicine)  Indicate any recent Medical Services you may have received from other than [facility] providers in the past year (date may be approximate).     Assessment:   This is a routine wellness examination for [place].  Hearing/Vision screen No exam data present  Dietary issues and exercise activities discussed:    Goals   None    Depression Screen PHQ 2/9 Scores 11/21/2019 06/27/2019 05/20/2019 05/02/2019 11/19/2018 11/19/2018 05/22/2018  PHQ - 2 Score 0 0 0 0 2 4 0  PHQ- 9 Score 0 - 0 - 2 4 -  Exception Documentation - - - - - - -    Fall Risk Fall Risk  11/21/2019 06/27/2019 05/20/2019 05/02/2019 11/19/2018  Falls in the past year? 0 0 0 0 0  Number falls in past yr: 0 0 0 0 0  Injury with Fall? 0 0 0 0 0  Risk for fall due to : No Fall Risks - - - -  Follow up - Falls evaluation completed Falls evaluation completed Falls evaluation completed Falls evaluation completed    Any stairs in or around the home? {YES/NO:21197} If so, are there any without handrails? {YES/NO:21197} Home free of loose throw rugs in walkways, pet beds, electrical cords, etc? {YES/NO:21197} Adequate lighting in your home to reduce risk of falls? {YES/NO:21197}  ASSISTIVE DEVICES UTILIZED TO PREVENT FALLS:  Life alert? {YES/NO:21197} Use of a cane, walker or w/c? {YES/NO:21197} Grab bars in the bathroom? {YES/NO:21197} Shower chair or bench in shower? {YES/NO:21197} Elevated toilet seat or a handicapped toilet? {YES/NO:21197}  TIMED UP AND GO:  Was the test performed? {YES/NO:21197}.  Length of time to ambulate 10 feet: *** sec.   {Appearance of BMWU:1324401}  Cognitive Function:   Montreal Cognitive Assessment  08/02/2013  Visuospatial/ Executive (0/5) 5  Naming (0/3) 3  Attention: Read list of digits (0/2) 2  Attention: Read list of letters (0/1) 0  Attention: Serial 7 subtraction starting at 100 (0/3) 3  Language: Repeat phrase (0/2) 2  Language : Fluency (0/1) 1  Abstraction (0/2) 2  Delayed  Recall (0/5) 4  Orientation (0/6) 6  Total 28  Adjusted Score (based on  education) 28      Immunizations Immunization History  Administered Date(s) Administered   Influenza Split 10/21/2011, 10/25/2012, 12/24/2014, 10/15/2015   Influenza Whole 09/10/2008, 10/10/2009, 09/11/2010   Influenza,inj,Quad PF,6+ Mos 09/10/2013, 10/28/2016, 10/03/2017, 10/01/2018, 10/30/2019   Moderna SARS-COVID-2 Vaccination 03/28/2019, 04/24/2019   Pneumococcal Conjugate-13 01/31/2017   Pneumococcal Polysaccharide-23 09/11/2007, 10/30/2017   Tdap 12/21/2012   Zoster 02/05/2019   Zoster Recombinat (Shingrix) 10/01/2018    TDAP status: Up to date   Flu Vaccine status: Up to date   Pneumococcal vaccine status: Up to date   Covid-19 vaccine status: Completed vaccines  Qualifies for Shingles Vaccine? No   Zostavax completed Yes   Shingrix Completed?: Yes  Screening Tests Health Maintenance  Topic Date Due   Hepatitis C Screening  11/20/2020 (Originally 1955/10/01)   HIV Screening  11/20/2020 (Originally [birth year]31/1971)   MAMMOGRAM  12/23/2020   TETANUS/TDAP  12/22/2022   COLONOSCOPY  03/22/2027   INFLUENZA VACCINE  Completed   COVID-19 Vaccine  Completed    Health Maintenance  There are no preventive care reminders to display for this patient.  Colorectal cancer screening: Completed Colonoscopy 03/21/2017. Repeat every 10 years   {Mammogram status:21018020}   Bone Density status: Not yet indicated  Lung Cancer Screening: (Low Dose CT Chest recommended if Age 53-80 years, 30 pack-year currently smoking OR have quit w/in 15years.) does not qualify.     Additional Screening:  Hepatitis C Screening: does qualify; Discuss with PCP  Vision Screening: Recommended annual ophthalmology exams for early detection of glaucoma and other disorders of the eye. Is the patient up to date with their annual eye exam?  {YES/NO:21197} Who is the provider or what is the name of the office in  which the patient attends annual eye exams? *** If pt is not established with a provider, would they like to be referred to a provider to establish care? {YES/NO:21197}.   Dental Screening: Recommended annual dental exams for proper oral hygiene  Community Resource Referral / Chronic Care Management: CRR required this visit?  {YES/NO:21197}  CCM required this visit?  {YES/NO:21197}     Plan:     I have personally reviewed and noted the following in the patients chart:    Medical and social history  Use of alcohol, tobacco or illicit drugs   Current medications and supplements  Functional ability and status  Nutritional status  Physical activity  Advanced directives  List of other physicians  Hospitalizations, surgeries, and ER visits in previous 12 months  Vitals  Screenings to include cognitive, depression, and falls  Referrals and appointments  In addition, I have reviewed and discussed with patient certain preventive protocols, quality metrics, and best practice recommendations. A written personalized care plan for preventive services as well as general preventive health recommendations were provided to patient.   Due to this being a telephonic visit, the after visit summary with patients personalized plan was offered to patient via mail or my-chart.  Patient would like to access on my-chart.   Marta Antu, LPN   [id number]  Nurse Health Advisor  Nurse Notes: ***

## 2019-12-02 ENCOUNTER — Ambulatory Visit: Payer: Medicare Other

## 2019-12-06 NOTE — Progress Notes (Signed)
Subjective:   Carol Quinn is a 64 y.o. female who presents for Medicare Annual (Subsequent) preventive examination.  I connected with Marien today by telephone and verified that I am speaking with the correct person using two identifiers. Location patient: home Location provider: work Persons participating in the virtual visit: patient, Marine scientist.    I discussed the limitations, risks, security and privacy concerns of performing an evaluation and management service by telephone and the availability of in person appointments. I also discussed with the patient that there may be a patient responsible charge related to this service. The patient expressed understanding and verbally consented to this telephonic visit.    Interactive audio and video telecommunications were attempted between this provider and patient, however failed, due to patient having technical difficulties OR patient did not have access to video capability.  We continued and completed visit with audio only.  Some vital signs may be absent or patient reported.   Time Spent with patient on telephone encounter: 35 minutes   Review of Systems     Cardiac Risk Factors include: hypertension;dyslipidemia     Objective:    Today's Vitals   12/09/19 1416  Weight: 157 lb (71.2 kg)  Height: 5\' 2"  (1.575 m)  PainSc: 6    Body mass index is 28.72 kg/m.  Advanced Directives 12/09/2019 10/11/2019 08/23/2019 05/29/2018 08/21/2017 04/21/2017 03/21/2017  Does Patient Have a Medical Advance Directive? Yes Yes Yes Yes No No Yes  Type of Paramedic of [place];Living will [place];Living will [place];Living will Living will;Healthcare Power of [place] will  Does patient want to make changes to medical advance directive? - No - Patient declined No - Patient declined No - Patient declined - - -  Copy of [place] in Chart? No - copy requested - No  - copy requested No - copy requested - - -    Current Medications (verified) Outpatient Encounter Medications as of 12/09/2019  Medication Sig  . albuterol (PROVENTIL) (2.5 MG/3ML) 0.083% nebulizer solution Take 3 mLs (2.5 mg total) by nebulization every 6 (six) hours as needed for wheezing or shortness of breath.  . ALPRAZolam (XANAX) 0.5 MG tablet Take 1 tablet (0.5 mg total) by mouth 2 (two) times daily as needed for anxiety.  Marland Kitchen atorvastatin (LIPITOR) 40 MG tablet Take 40 mg by mouth daily.  Marland Kitchen buPROPion (WELLBUTRIN XL) 150 MG 24 hr tablet Take 1 tablet (150 mg total) by mouth every morning.  . dorzolamide (TRUSOPT) 2 % ophthalmic solution Apply to eye.  . escitalopram (LEXAPRO) 20 MG tablet TAKE 1 TABLET BY MOUTH DAILY  . estradiol (VIVELLE-DOT) 0.05 MG/24HR patch Place 0.05 mg onto the skin.  . fexofenadine (ALLEGRA) 180 MG tablet Take 180 mg by mouth daily.  . Fluticasone-Umeclidin-Vilant (TRELEGY ELLIPTA) 100-62.5-25 MCG/INH AEPB Inhale 1 puff into the lungs daily.  Marland Kitchen LYSINE PO Take by mouth daily.  . modafinil (PROVIGIL) 200 MG tablet TAKE 1 TABLET BY MOUTH DAILY  . montelukast (SINGULAIR) 10 MG tablet TAKE 1 TABLET BY MOUTH ONCE DAILY AT BEDTIME  . naphazoline-pheniramine (NAPHCON-A) 0.025-0.3 % ophthalmic solution Apply to eye.  . Omega-3 Fatty Acids (FISH OIL) 1200 MG CAPS Take by mouth.  Marland Kitchen omeprazole (PRILOSEC) 40 MG capsule TAKE 1 CAPSULE BY MOUTH DAILY  . PREVIDENT 5000 BOOSTER PLUS 1.1 % PSTE   . Respiratory Therapy Supplies (FLUTTER) DEVI Use as directed  . valsartan-hydrochlorothiazide (DIOVAN-HCT) 160-25 MG tablet TAKE  1 TABLET BY MOUTH DAILY   No facility-administered encounter medications on file as of 12/09/2019.    Allergies (verified) Clarithromycin, Hydrocodone-homatropine, Hydrocodone-homatropine, Penicillins, Topiramate, and Codeine   History: Past Medical History:  Diagnosis Date  . Acute bronchitis   . Allergic rhinitis   . Anxiety   . Aortic  atherosclerosis ([place])   . Cancer ([place])   . Chronic headaches   . COPD (chronic obstructive pulmonary disease) (HCC)    emphysema  . Depression   . Emphysema, unspecified ([place])   . Exposure to TB    uncle-her PPD neg  . GERD (gastroesophageal reflux disease)   . Hepatic cyst    left  . Hypertension   . MDS (myelodysplastic syndrome), low grade ([place]) 09/23/2016  . 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 RBBB (right bundle branch block with left anterior fascicular block)    Past Surgical History:  Procedure Laterality Date  . BREAST BIOPSY  2002  . COLONOSCOPY WITH PROPOFOL N/A 03/21/2017   Procedure: COLONOSCOPY WITH PROPOFOL;  Surgeon: Jerene Bears, MD;  Location: [facility];  Service: Gastroenterology;  Laterality: N/A;  . NOSE SURGERY     "nasal deviation"  . TOTAL ABDOMINAL HYSTERECTOMY    . TRIGGER FINGER RELEASE Right 06/05/2018   Procedure: RIGHT THUMB RELEASE TRIGGER FINGER/A-1 PULLEY;  Surgeon: Daryll Brod, MD;  Location: [facility];  Service: Orthopedics;  Laterality: Right;  . TUBAL LIGATION     Family History  Problem Relation Age of Onset  . Heart failure Father 45  . Emphysema Mother   . Heart attack Mother 38       Died age 27  . Asthma Sister   . Asthma Maternal Uncle   . Arthritis Maternal Grandmother   . Cancer Maternal Grandmother   . Cancer Sister   . Cancer Other        aunts  . Cancer Other        uncles  . Colon cancer Other    Social History   Socioeconomic History  . Marital status: Married    Spouse name: Remo Lipps  . Number of children: Not on file  . Years of education: Not on file  . Highest education level: Associate degree: academic program  Occupational History  . Occupation: Database administrator    Comment: [place] Use  . Smoking status: Former Smoker    Packs/day: 0.50    Years: 40.00    Pack years: 20.00    Types: Cigarettes    Quit date: 11/27/2017    Years since  quitting: 2.0  . Smokeless tobacco: Never Used  Vaping Use  . Vaping Use: Former  Substance and Sexual Activity  . Alcohol use: Yes    Alcohol/week: 0.0 standard drinks    Comment: social  . Drug use: No  . Sexual activity: Not on file  Other Topics Concern  . Not on file  Social History Narrative   Lives with husband.     Social Determinants of Health   Financial Resource Strain: Low Risk   . Difficulty of Paying Living Expenses: Not hard at all  Food Insecurity: No Food Insecurity  . Worried About Charity fundraiser in the Last Year: Never true  . Ran Out of Food in the Last Year: Never true  Transportation Needs: No Transportation Needs  . Lack of Transportation (Medical): No  . Lack of Transportation (Non-Medical): No  Physical Activity: Insufficiently  Active  . Days of Exercise per Week: 4 days  . Minutes of Exercise per Session: 20 min  Stress: Stress Concern Present  . Feeling of Stress : To some extent  Social Connections: Moderately Integrated  . Frequency of Communication with Friends and Family: More than three times a week  . Frequency of Social Gatherings with Friends and Family: Once a week  . Attends Religious Services: 1 to 4 times per year  . Active Member of Clubs or Organizations: No  . Attends Archivist Meetings: Never  . Marital Status: Married    Tobacco Counseling Counseling given: Not Answered   Clinical Intake:  Pre-visit preparation completed: Yes  Pain : 0-10 Pain Score: 6  Pain Type: Chronic pain Pain Location: Head Pain Onset: More than a month ago Pain Frequency: Constant Pain Relieving Factors: excedrin migraine  Pain Relieving Factors: excedrin migraine  Nutritional Status: BMI 25 -29 Overweight Nutritional Risks: None Diabetes: No  How often do you need to have someone help you when you read instructions, pamphlets, or other written materials from your doctor or pharmacy?: 1 - Never What is the last grade level  you completed in school?: Associate's degree  Diabetic?No  Interpreter Needed?: No  Information entered by :: Caroleen Hamman LPN   Activities of Daily Living In your present state of health, do you have any difficulty performing the following activities: 12/09/2019 11/21/2019  Hearing? Y N  Comment hearing aids -  Vision? N N  Difficulty concentrating or making decisions? N N  Walking or climbing stairs? N N  Dressing or bathing? N N  Doing errands, shopping? N N  Preparing Food and eating ? N -  Using the Toilet? N -  In the past six months, have you accidently leaked urine? Y -  Comment occasionally-wears pad -  Do you have problems with loss of bowel control? N -  Managing your Medications? N -  Managing your Finances? N -  Housekeeping or managing your Housekeeping? N -  Some recent data might be hidden    Patient Care Team: Midge Minium, MD as PCP - General (Family Medicine) Minus Breeding, MD as PCP - Cardiology (Cardiology) Lynder Parents., MD (Obstetrics and Gynecology) Hale Bogus., MD as Referring Physician (Gastroenterology) Iran Ouch, MD as Referring Physician (Ophthalmology) Midge Minium, MD as Referring Physician (Family Medicine)  Indicate any recent Medical Services you may have received from other than [facility] providers in the past year (date may be approximate).     Assessment:   This is a routine wellness examination for Ida.  Hearing/Vision screen  Hearing Screening   125Hz  250Hz  500Hz  1000Hz  2000Hz  3000Hz  4000Hz  6000Hz  8000Hz   Right ear:           Left ear:           Comments: Wears hearing aids  Vision Screening Comments: Wears glasses  Last eye exam-[id number]  Dietary issues and exercise activities discussed: Current Exercise Habits: Home exercise routine, Type of exercise: walking, Time (Minutes): 15, Frequency (Times/Week): 4, Weekly Exercise (Minutes/Week): 60, Intensity: Mild, Exercise limited by:  respiratory conditions(s)  Goals    . Patient Stated     Drink more water and increase activity as tolerated      Depression Screen PHQ 2/9 Scores 12/09/2019 11/21/2019 06/27/2019 05/20/2019 05/02/2019 11/19/2018 11/19/2018  PHQ - 2 Score 1 0 0 0 0 2 4  PHQ- 9 Score - 0 - 0 - 2 4  Exception Documentation - - - - - - -    Fall Risk Fall Risk  12/09/2019 11/21/2019 06/27/2019 05/20/2019 05/02/2019  Falls in the past year? 0 0 0 0 0  Number falls in past yr: 0 0 0 0 0  Injury with Fall? 0 0 0 0 0  Risk for fall due to : - No Fall Risks - - -  Follow up Falls prevention discussed - Falls evaluation completed Falls evaluation completed Falls evaluation completed    Any stairs in or around the home? Yes  If so, are there any without handrails? No  Home free of loose throw rugs in walkways, pet beds, electrical cords, etc? Yes  Adequate lighting in your home to reduce risk of falls? Yes   ASSISTIVE DEVICES UTILIZED TO PREVENT FALLS:  Life alert? No  Use of a cane, walker or w/c? No  Grab bars in the bathroom? Yes  Shower chair or bench in shower? No  Elevated toilet seat or a handicapped toilet? No   TIMED UP AND GO:  Was the test performed? No . Phone visit   Cognitive Function:No cognitive impairment noted   Montreal Cognitive Assessment  08/02/2013  Visuospatial/ Executive (0/5) 5  Naming (0/3) 3  Attention: Read list of digits (0/2) 2  Attention: Read list of letters (0/1) 0  Attention: Serial 7 subtraction starting at 100 (0/3) 3  Language: Repeat phrase (0/2) 2  Language : Fluency (0/1) 1  Abstraction (0/2) 2  Delayed Recall (0/5) 4  Orientation (0/6) 6  Total 28  Adjusted Score (based on education) 28      Immunizations Immunization History  Administered Date(s) Administered  . Influenza Split 10/21/2011, 10/25/2012, 12/24/2014, 10/15/2015  . Influenza Whole 09/10/2008, 10/10/2009, 09/11/2010  . Influenza,inj,Quad PF,6+ Mos 09/10/2013, 10/28/2016, 10/03/2017,  10/01/2018, 10/30/2019  . Moderna SARS-COVID-2 Vaccination 03/28/2019, 04/24/2019  . Pneumococcal Conjugate-13 01/31/2017  . Pneumococcal Polysaccharide-23 09/11/2007, 10/30/2017  . Tdap 12/21/2012  . Zoster 02/05/2019  . Zoster Recombinat (Shingrix) 10/01/2018    TDAP status: Up to date   Flu Vaccine status: Up to date   Pneumococcal vaccine status: Up to date   Covid-19 vaccine status: Completed vaccines  Qualifies for Shingles Vaccine? No   Zostavax completed No   Shingrix Completed?: Yes  Screening Tests Health Maintenance  Topic Date Due  . Hepatitis C Screening  11/20/2020 (Originally Dec 05, 1955)  . HIV Screening  11/20/2020 (Originally 12/31/1970)  . MAMMOGRAM  12/23/2020  . TETANUS/TDAP  12/22/2022  . COLONOSCOPY  03/22/2027  . INFLUENZA VACCINE  Completed  . COVID-19 Vaccine  Completed    Health Maintenance  There are no preventive care reminders to display for this patient.  Colorectal cancer screening: Completed Colonosopy 03/21/2017. Repeat every 5 years   Mammogram status: Completed Bilateral 12/24/2018. Repeat every year   Bone Density-Not yet indicated  Lung Cancer Screening: (Low Dose CT Chest recommended if Age 1-80 years, 30 pack-year currently smoking OR have quit w/in 15years.) does not qualify.    Additional Screening:  Hepatitis C Screening: does qualify; Discuss with PCP  Vision Screening: Recommended annual ophthalmology exams for early detection of glaucoma and other disorders of the eye. Is the patient up to date with their annual eye exam?  Yes  Who is the provider or what is the name of the office in which the patient attends annual eye exams? [facility]   Dental Screening: Recommended annual dental exams for proper oral hygiene  Community Resource Referral / Chronic Care Management:  CRR required this visit?  No   CCM required this visit?  No      Plan:     I have personally reviewed and noted the following in the patient's  chart:   . Medical and social history . Use of alcohol, tobacco or illicit drugs  . Current medications and supplements . Functional ability and status . Nutritional status . Physical activity . Advanced directives . List of other physicians . Hospitalizations, surgeries, and ER visits in previous 12 months . Vitals . Screenings to include cognitive, depression, and falls . Referrals and appointments  In addition, I have reviewed and discussed with patient certain preventive protocols, quality metrics, and best practice recommendations. A written personalized care plan for preventive services as well as general preventive health recommendations were provided to patient.   Due to this being a telephonic visit, the after visit summary with patients personalized plan was offered to patient via mail or my-chart.  Patient would like to access on my-chart.   Marta Antu, LPN   16/01/930  Nurse Health Advisor  Nurse Notes: None

## 2019-12-09 ENCOUNTER — Other Ambulatory Visit: Payer: Self-pay | Admitting: Obstetrics and Gynecology

## 2019-12-09 ENCOUNTER — Other Ambulatory Visit: Payer: Self-pay

## 2019-12-09 ENCOUNTER — Ambulatory Visit (INDEPENDENT_AMBULATORY_CARE_PROVIDER_SITE_OTHER): Payer: Medicare Other

## 2019-12-09 VITALS — Ht 62.0 in | Wt 157.0 lb

## 2019-12-09 DIAGNOSIS — Z0000 Encounter for general adult medical examination without abnormal findings: Secondary | ICD-10-CM | POA: Diagnosis not present

## 2019-12-09 DIAGNOSIS — Z1231 Encounter for screening mammogram for malignant neoplasm of breast: Secondary | ICD-10-CM

## 2019-12-09 NOTE — Patient Instructions (Signed)
Carol Quinn , Thank you for taking time to complete your Medicare Wellness Visit. I appreciate your ongoing commitment to your health goals. Please review the following plan we discussed and let me know if I can assist you in the future.   Screening recommendations/referrals: Colonoscopy: Completed 03/21/2017-Due 03/22/2022 Mammogram: Completed 12/24/2018-Due 12/24/2019 Bone Density: Not indicated until age 63 Recommended yearly ophthalmology/optometry visit for glaucoma screening and checkup Recommended yearly dental visit for hygiene and checkup  Vaccinations: Influenza vaccine: Up to date Pneumococcal vaccine: Completed vaccines Tdap vaccine: Up to date-Due-12/22/2022 Shingles vaccine: Completed vaccines Covid-19: Completed vaccines  Advanced directives: Please bring a copy for your chart  Conditions/risks identified: See problem list  Next appointment: Follow up in one year for your annual wellness visit. 12/14/2020 @ 2:15.  Preventive Care 40-64 Years, Female Preventive care refers to lifestyle choices and visits with your health care provider that can promote health and wellness. What does preventive care include?  A yearly physical exam. This is also called an annual well check.  Dental exams once or twice a year.  Routine eye exams. Ask your health care provider how often you should have your eyes checked.  Personal lifestyle choices, including:  Daily care of your teeth and gums.  Regular physical activity.  Eating a healthy diet.  Avoiding tobacco and drug use.  Limiting alcohol use.  Practicing safe sex.  Taking low-dose aspirin daily starting at age 71.  Taking vitamin and mineral supplements as recommended by your health care provider. What happens during an annual well check? The services and screenings done by your health care provider during your annual well check will depend on your age, overall health, lifestyle risk factors, and family history of  disease. Counseling  Your health care provider may ask you questions about your:  Alcohol use.  Tobacco use.  Drug use.  Emotional well-being.  Home and relationship well-being.  Sexual activity.  Eating habits.  Work and work Statistician.  Method of birth control.  Menstrual cycle.  Pregnancy history. Screening  You may have the following tests or measurements:  Height, weight, and BMI.  Blood pressure.  Lipid and cholesterol levels. These may be checked every 5 years, or more frequently if you are over 65 years old.  Skin check.  Lung cancer screening. You may have this screening every year starting at age 100 if you have a 30-pack-year history of smoking and currently smoke or have quit within the past 15 years.  Fecal occult blood test (FOBT) of the stool. You may have this test every year starting at age 62.  Flexible sigmoidoscopy or colonoscopy. You may have a sigmoidoscopy every 5 years or a colonoscopy every 10 years starting at age 21.  Hepatitis C blood test.  Hepatitis B blood test.  Sexually transmitted disease (STD) testing.  Diabetes screening. This is done by checking your blood sugar (glucose) after you have not eaten for a while (fasting). You may have this done every 1-3 years.  Mammogram. This may be done every 1-2 years. Talk to your health care provider about when you should start having regular mammograms. This may depend on whether you have a family history of breast cancer.  BRCA-related cancer screening. This may be done if you have a family history of breast, ovarian, tubal, or peritoneal cancers.  Pelvic exam and Pap test. This may be done every 3 years starting at age 76. Starting at age 75, this may be done every 5 years if you  have a Pap test in combination with an HPV test.  Bone density scan. This is done to screen for osteoporosis. You may have this scan if you are at high risk for osteoporosis. Discuss your test results,  treatment options, and if necessary, the need for more tests with your health care provider. Vaccines  Your health care provider may recommend certain vaccines, such as:  Influenza vaccine. This is recommended every year.  Tetanus, diphtheria, and acellular pertussis (Tdap, Td) vaccine. You may need a Td booster every 10 years.  Zoster vaccine. You may need this after age 38.  Pneumococcal 13-valent conjugate (PCV13) vaccine. You may need this if you have certain conditions and were not previously vaccinated.  Pneumococcal polysaccharide (PPSV23) vaccine. You may need one or two doses if you smoke cigarettes or if you have certain conditions. Talk to your health care provider about which screenings and vaccines you need and how often you need them. This information is not intended to replace advice given to you by your health care provider. Make sure you discuss any questions you have with your health care provider. Document Released: 01/23/2015 Document Revised: 09/16/2015 Document Reviewed: 10/28/2014 Elsevier Interactive Patient Education  2017 [place] Prevention in the Home Falls can cause injuries. They can happen to people of all ages. There are many things you can do to make your home safe and to help prevent falls. What can I do on the outside of my home?  Regularly fix the edges of walkways and driveways and fix any cracks.  Remove anything that might make you trip as you walk through a door, such as a raised step or threshold.  Trim any bushes or trees on the path to your home.  Use bright outdoor lighting.  Clear any walking paths of anything that might make someone trip, such as rocks or tools.  Regularly check to see if handrails are loose or broken. Make sure that both sides of any steps have handrails.  Any raised decks and porches should have guardrails on the edges.  Have any leaves, snow, or ice cleared regularly.  Use sand or salt on walking  paths during winter.  Clean up any spills in your garage right away. This includes oil or grease spills. What can I do in the bathroom?  Use night lights.  Install grab bars by the toilet and in the tub and shower. Do not use towel bars as grab bars.  Use non-skid mats or decals in the tub or shower.  If you need to sit down in the shower, use a plastic, non-slip stool.  Keep the floor dry. Clean up any water that spills on the floor as soon as it happens.  Remove soap buildup in the tub or shower regularly.  Attach bath mats securely with double-sided non-slip rug tape.  Do not have throw rugs and other things on the floor that can make you trip. What can I do in the bedroom?  Use night lights.  Make sure that you have a light by your bed that is easy to reach.  Do not use any sheets or blankets that are too big for your bed. They should not hang down onto the floor.  Have a firm chair that has side arms. You can use this for support while you get dressed.  Do not have throw rugs and other things on the floor that can make you trip. What can I do in the kitchen?  Clean up any spills right away.  Avoid walking on wet floors.  Keep items that you use a lot in easy-to-reach places.  If you need to reach something above you, use a strong step stool that has a grab bar.  Keep electrical cords out of the way.  Do not use floor polish or wax that makes floors slippery. If you must use wax, use non-skid floor wax.  Do not have throw rugs and other things on the floor that can make you trip. What can I do with my stairs?  Do not leave any items on the stairs.  Make sure that there are handrails on both sides of the stairs and use them. Fix handrails that are broken or loose. Make sure that handrails are as long as the stairways.  Check any carpeting to make sure that it is firmly attached to the stairs. Fix any carpet that is loose or worn.  Avoid having throw rugs at  the top or bottom of the stairs. If you do have throw rugs, attach them to the floor with carpet tape.  Make sure that you have a light switch at the top of the stairs and the bottom of the stairs. If you do not have them, ask someone to add them for you. What else can I do to help prevent falls?  Wear shoes that:  Do not have high heels.  Have rubber bottoms.  Are comfortable and fit you well.  Are closed at the toe. Do not wear sandals.  If you use a stepladder:  Make sure that it is fully opened. Do not climb a closed stepladder.  Make sure that both sides of the stepladder are locked into place.  Ask someone to hold it for you, if possible.  Clearly mark and make sure that you can see:  Any grab bars or handrails.  First and last steps.  Where the edge of each step is.  Use tools that help you move around (mobility aids) if they are needed. These include:  Canes.  Walkers.  Scooters.  Crutches.  Turn on the lights when you go into a dark area. Replace any light bulbs as soon as they burn out.  Set up your furniture so you have a clear path. Avoid moving your furniture around.  If any of your floors are uneven, fix them.  If there are any pets around you, be aware of where they are.  Review your medicines with your doctor. Some medicines can make you feel dizzy. This can increase your chance of falling. Ask your doctor what other things that you can do to help prevent falls. This information is not intended to replace advice given to you by your health care provider. Make sure you discuss any questions you have with your health care provider. Document Released: 10/23/2008 Document Revised: 06/04/2015 Document Reviewed: 01/31/2014 Elsevier Interactive Patient Education  2017 [employer].

## 2019-12-10 ENCOUNTER — Ambulatory Visit
Admission: RE | Admit: 2019-12-10 | Discharge: 2019-12-10 | Disposition: A | Discharge status: Home / Self Care | Payer: Medicare Other | Source: Ambulatory Visit | Attending: Diagnostic Neuroimaging | Admitting: Diagnostic Neuroimaging

## 2019-12-10 ENCOUNTER — Other Ambulatory Visit: Payer: Self-pay

## 2019-12-10 ENCOUNTER — Other Ambulatory Visit: Payer: Self-pay | Admitting: Internal Medicine

## 2019-12-10 DIAGNOSIS — I671 Cerebral aneurysm, nonruptured: Secondary | ICD-10-CM

## 2019-12-13 ENCOUNTER — Encounter: Payer: Self-pay | Admitting: Family Medicine

## 2019-12-16 ENCOUNTER — Encounter: Payer: Self-pay | Admitting: *Deleted

## 2020-01-07 ENCOUNTER — Ambulatory Visit: Payer: Medicare Other | Admitting: Diagnostic Neuroimaging

## 2020-01-27 ENCOUNTER — Ambulatory Visit: Payer: Medicare Other | Admitting: Diagnostic Neuroimaging

## 2020-02-04 ENCOUNTER — Other Ambulatory Visit: Payer: Self-pay | Admitting: Family Medicine

## 2020-02-13 ENCOUNTER — Other Ambulatory Visit: Payer: Self-pay

## 2020-02-13 ENCOUNTER — Encounter (HOSPITAL_BASED_OUTPATIENT_CLINIC_OR_DEPARTMENT_OTHER): Payer: Self-pay

## 2020-02-13 ENCOUNTER — Encounter: Payer: Self-pay | Admitting: Hematology & Oncology

## 2020-02-13 ENCOUNTER — Inpatient Hospital Stay (HOSPITAL_BASED_OUTPATIENT_CLINIC_OR_DEPARTMENT_OTHER): Payer: Medicare Other | Admitting: Hematology & Oncology

## 2020-02-13 ENCOUNTER — Ambulatory Visit (HOSPITAL_BASED_OUTPATIENT_CLINIC_OR_DEPARTMENT_OTHER)
Admission: RE | Admit: 2020-02-13 | Discharge: 2020-02-13 | Disposition: A | Discharge status: Home / Self Care | Payer: Medicare Other | Source: Ambulatory Visit | Attending: Hematology & Oncology | Admitting: Hematology & Oncology

## 2020-02-13 ENCOUNTER — Telehealth: Payer: Self-pay | Admitting: *Deleted

## 2020-02-13 ENCOUNTER — Inpatient Hospital Stay: Payer: Medicare Other | Attending: Hematology & Oncology

## 2020-02-13 VITALS — BP 127/72 | HR 89 | Temp 98.6°F | Resp 20 | Ht 62.0 in | Wt 159.0 lb

## 2020-02-13 DIAGNOSIS — J449 Chronic obstructive pulmonary disease, unspecified: Secondary | ICD-10-CM | POA: Insufficient documentation

## 2020-02-13 DIAGNOSIS — Z79899 Other long term (current) drug therapy: Secondary | ICD-10-CM | POA: Diagnosis not present

## 2020-02-13 DIAGNOSIS — C6931 Malignant neoplasm of right choroid: Secondary | ICD-10-CM | POA: Diagnosis not present

## 2020-02-13 DIAGNOSIS — Z85840 Personal history of malignant neoplasm of eye: Secondary | ICD-10-CM | POA: Insufficient documentation

## 2020-02-13 LAB — CMP (CANCER CENTER ONLY)
ALT: 28 U/L (ref 0–44)
AST: 19 U/L (ref 15–41)
Albumin: 4.4 g/dL (ref 3.5–5.0)
Alkaline Phosphatase: 83 U/L (ref 38–126)
Anion gap: 6 (ref 5–15)
BUN: 16 mg/dL (ref 8–23)
CO2: 35 mmol/L — ABNORMAL HIGH (ref 22–32)
Calcium: 9.8 mg/dL (ref 8.9–10.3)
Chloride: 101 mmol/L (ref 98–111)
Creatinine: 0.71 mg/dL (ref 0.44–1.00)
GFR, Estimated: >60 mL/min (ref >60)
Glucose, Bld: 116 mg/dL — ABNORMAL HIGH (ref 70–99)
Potassium: 4 mmol/L (ref 3.5–5.1)
Sodium: 142 mmol/L (ref 135–145)
Total Bilirubin: 0.5 mg/dL (ref 0.3–1.2)
Total Protein: 6.7 g/dL (ref 6.5–8.1)

## 2020-02-13 LAB — CBC WITH DIFFERENTIAL (CANCER CENTER ONLY)
Abs Immature Granulocytes: 0.02 10*3/uL (ref 0.00–0.07)
Basophils Absolute: 0.1 10*3/uL (ref 0.0–0.1)
Basophils Relative: 1 %
Eosinophils Absolute: 0.3 10*3/uL (ref 0.0–0.5)
Eosinophils Relative: 3 %
HCT: 42.2 % (ref 36.0–46.0)
Hemoglobin: 13.8 g/dL (ref 12.0–15.0)
Immature Granulocytes: 0 %
Lymphocytes Relative: 23 %
Lymphs Abs: 1.9 10*3/uL (ref 0.7–4.0)
MCH: 31.9 pg (ref 26.0–34.0)
MCHC: 32.7 g/dL (ref 30.0–36.0)
MCV: 97.7 fL (ref 80.0–100.0)
Monocytes Absolute: 0.8 10*3/uL (ref 0.1–1.0)
Monocytes Relative: 10 %
Neutro Abs: 5.1 10*3/uL (ref 1.7–7.7)
Neutrophils Relative %: 63 %
Platelet Count: 298 10*3/uL (ref 150–400)
RBC: 4.32 MIL/uL (ref 3.87–5.11)
RDW: 12.5 % (ref 11.5–15.5)
WBC Count: 8.2 10*3/uL (ref 4.0–10.5)
nRBC: 0 % (ref 0.0–0.2)

## 2020-02-13 LAB — LACTATE DEHYDROGENASE: LDH: 135 U/L (ref 98–192)

## 2020-02-13 IMAGING — CT CT CHEST W/ CM
3 of 5 series · 14 of 36 positions shown, 17 images · IV contrast (APPLIED)
Comparison: 12/23/2016

CLINICAL DATA: Melanoma of the right eye. Slightly elevated liver
function tests.

EXAM:
CT CHEST, ABDOMEN, AND PELVIS WITH CONTRAST
TECHNIQUE: Multidetector CT imaging of the chest, abdomen and pelvis was
performed following the standard protocol during bolus
administration of intravenous contrast.
CONTRAST:  100mL [id number] IOPAMIDOL ([id number]) INJECTION 61%

[Series 2: cap with 2 · axial · 0.66mm/px · z∈[+684,+1168]mm · 9 of 123 slices shown, 12 images]
[im 13/123  mediastinal]
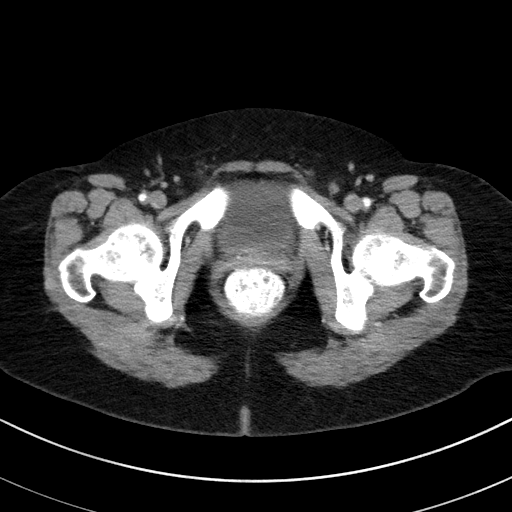
[im 13/123  lung]
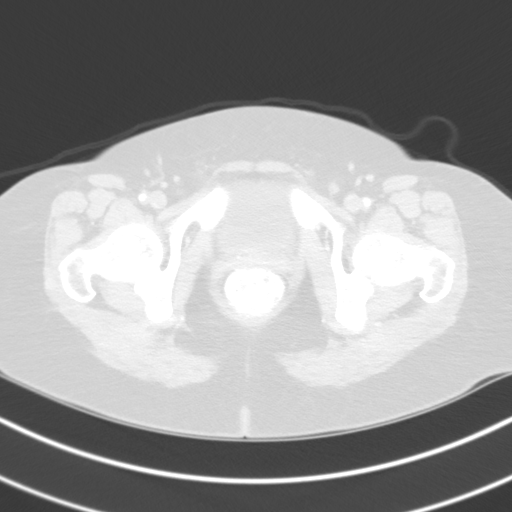
[im 25/123  lung]
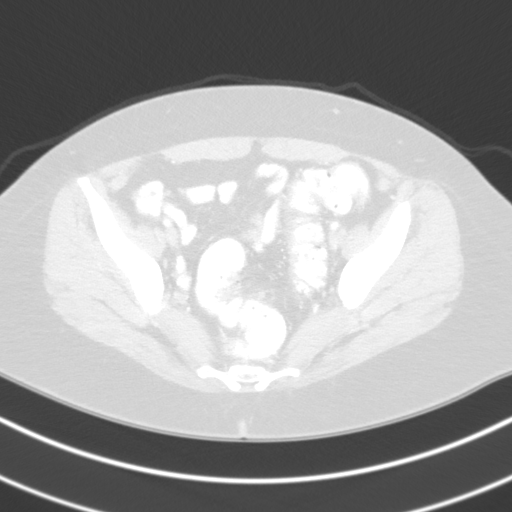
[im 37/123  lung]
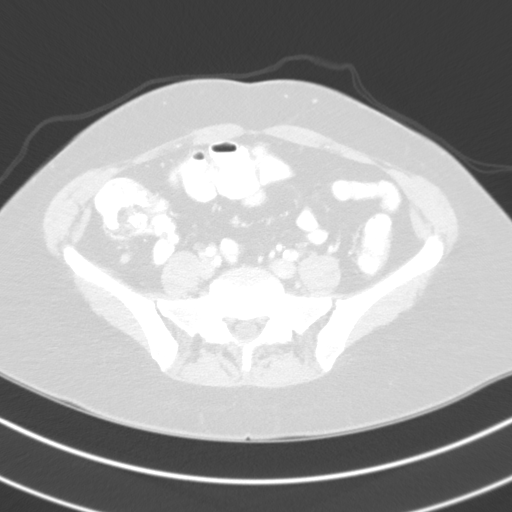
[im 49/123  lung]
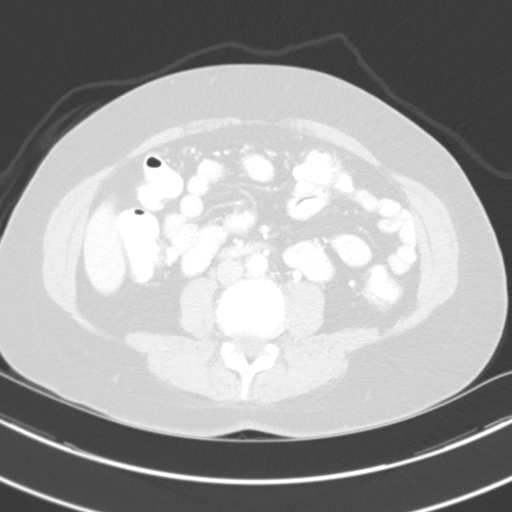
[im 62/123  mediastinal]
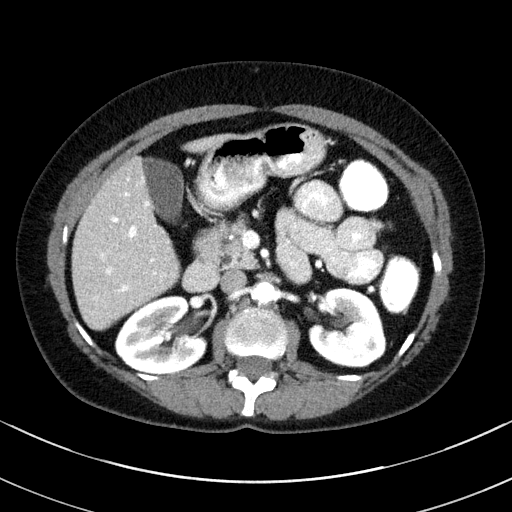
[im 62/123  lung]
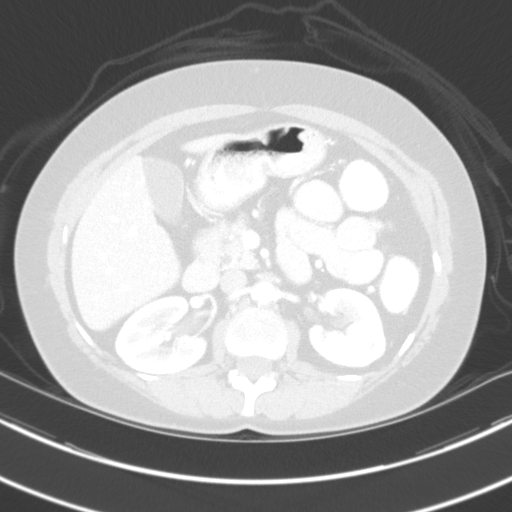
[im 74/123  lung]
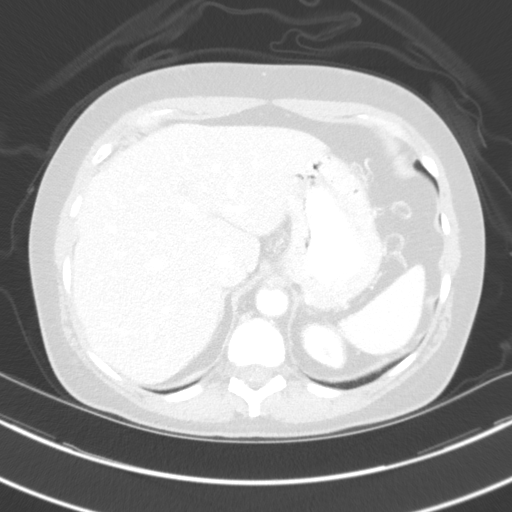
[im 86/123  lung]
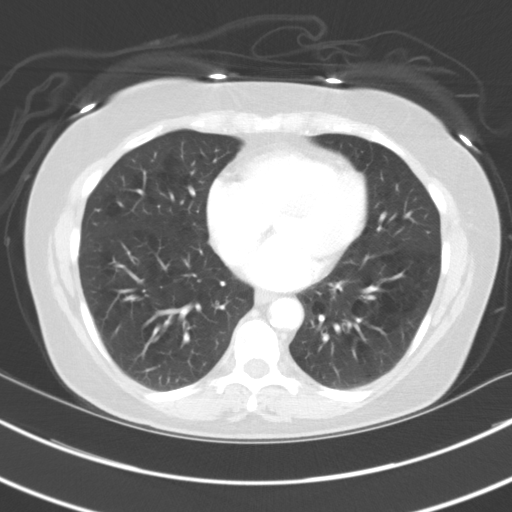
[im 98/123  lung]
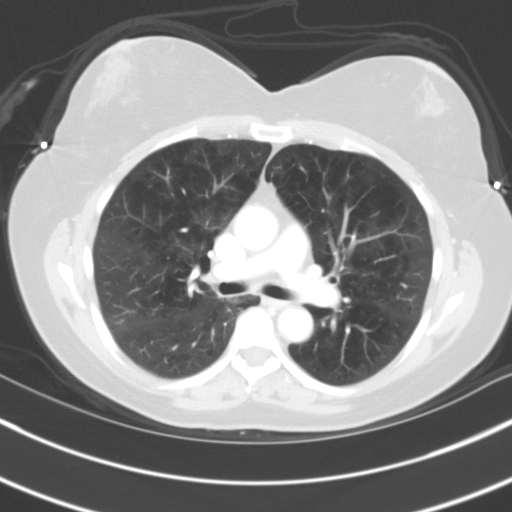
[im 110/123  mediastinal]
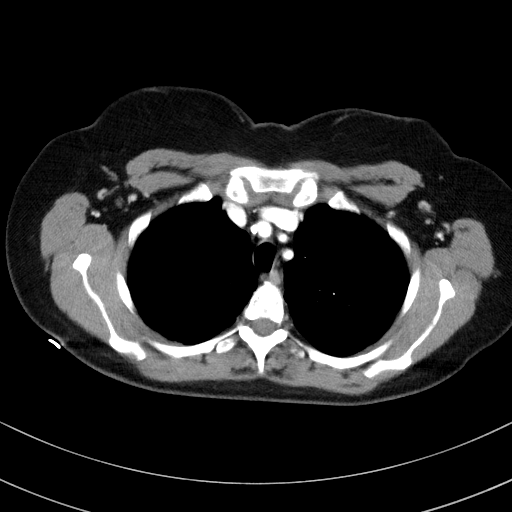
[im 110/123  lung]
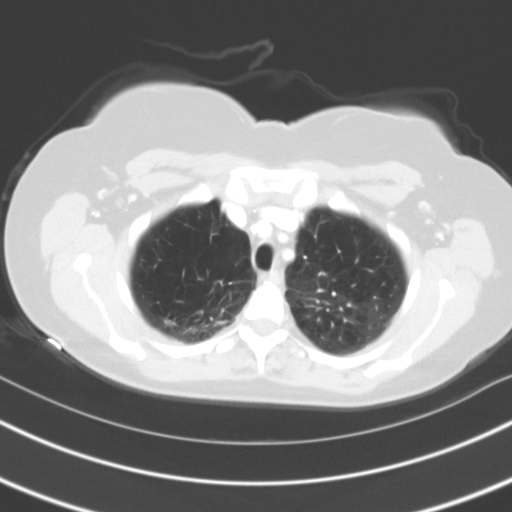

[Series 4: lung · axial · 0.66mm/px · z∈[+992,+1036]mm · 2 of 132 slices shown]
[im 11/132  lung]
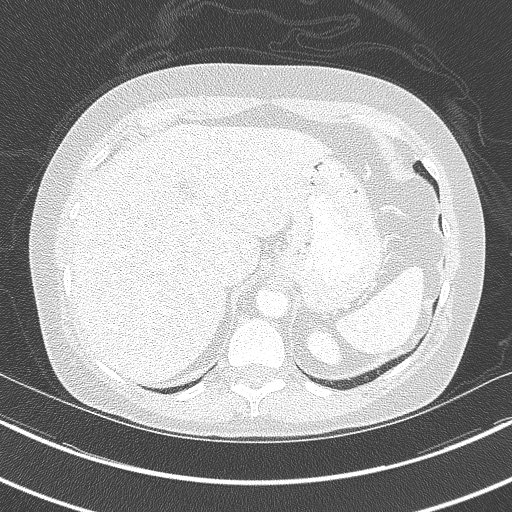
[im 33/132  lung]
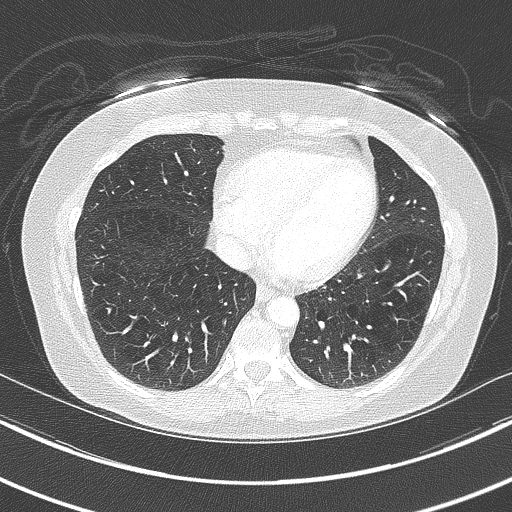

[Series 5: coronals · coronal · 0.77mm/px · 3 of 125 slices shown]
[im 25/125  lung]
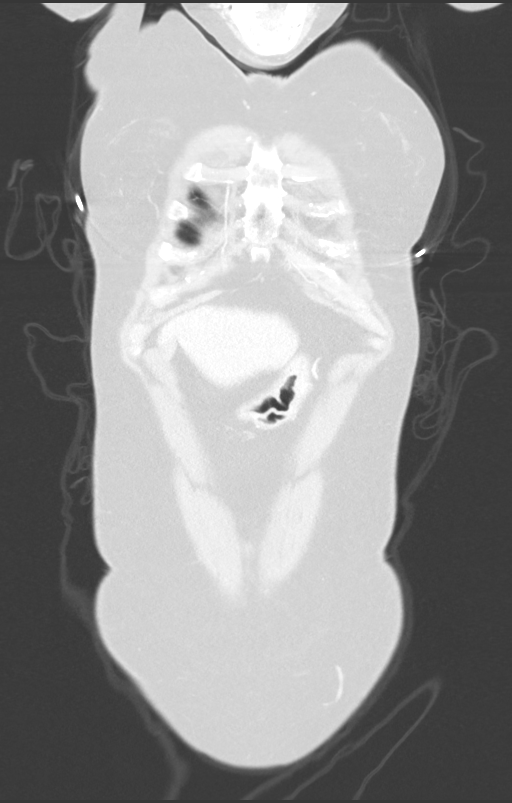
[im 50/125  lung]
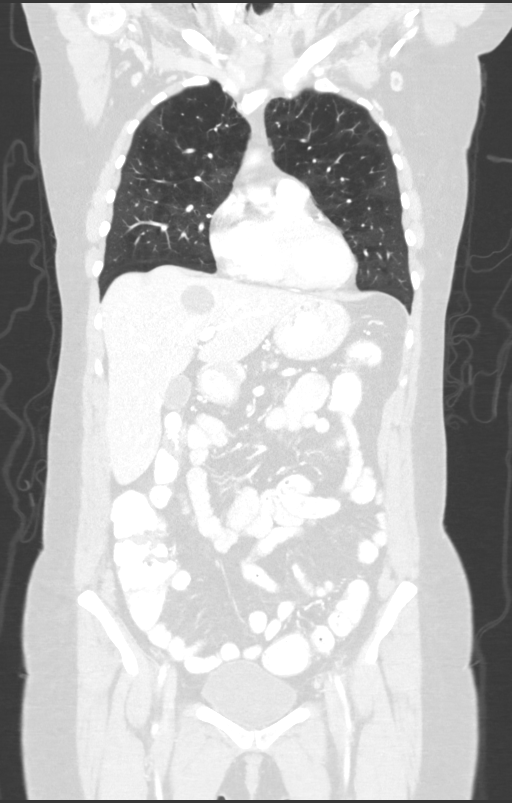
[im 75/125  lung]
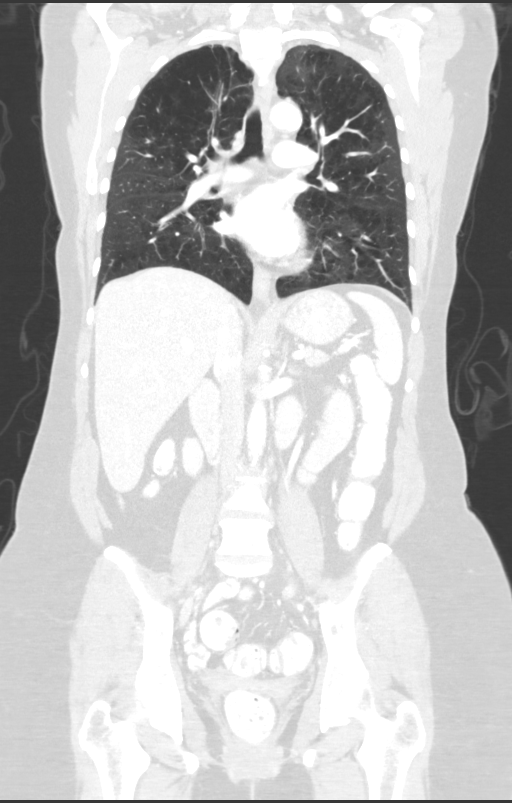

[14 of 36 positions shown; findings below may reference images not displayed]

FINDINGS: CT CHEST FINDINGS

Cardiovascular: Aortic and branch vessel atherosclerosis. Normal
heart size, without pericardial effusion. Lad coronary artery
atherosclerosis. No central pulmonary embolism, on this
non-dedicated study.

Mediastinum/Nodes: No supraclavicular adenopathy. No mediastinal or
hilar adenopathy.

Lungs/Pleura: No pleural fluid. Moderate to marked bullous type
emphysema. Lower lobe predominant bronchial wall thickening.
Biapical pleuroparenchymal scarring.

No suspicious pulmonary nodule or mass.

Musculoskeletal: No acute osseous abnormality. Degenerative disc
disease involves the lower cervical spine.

CT ABDOMEN PELVIS FINDINGS

Hepatobiliary: Dominant left hepatic lobe cyst at 3.1 cm. Focal
steatosis adjacent the falciform ligament. No suspicious liver
lesion. Normal gallbladder, without biliary ductal dilatation.

Pancreas: Normal, without mass or ductal dilatation.

Spleen: Normal in size, without focal abnormality.

Adrenals/Urinary Tract: Normal right adrenal gland. Mild left
adrenal thickening and nodularity, similar. Normal left kidney.
Interpolar right renal too small to characterize lesion. Normal
urinary bladder.

Stomach/Bowel: Normal stomach, without wall thickening. Normal
colon, appendix, and terminal ileum. Normal small bowel.

Vascular/Lymphatic: Advanced aortic and branch vessel
atherosclerosis. No abdominopelvic adenopathy.

Reproductive: Hysterectomy.  No adnexal mass.

Other: No significant free fluid. No evidence of omental or
peritoneal disease.

Musculoskeletal: Probable bone island within the left side of the L1
vertebral body. Similar. Degenerate disc disease centered about
L2-3.
IMPRESSION: 1. No acute process or evidence of metastatic disease within the
chest, abdomen, or pelvis.
2. No explanation for elevated liver function tests.
3. Age advanced coronary artery atherosclerosis. Recommend
assessment of coronary risk factors and consideration of medical
therapy.
4. Aortic atherosclerosis (Z8KRJ-2RT.T) and emphysema (Z8KRJ-[id number].S).

## 2020-02-13 MED ORDER — IOHEXOL 300 MG/ML  SOLN
100.0000 mL | Freq: Once | INTRAMUSCULAR | Status: AC | PRN
  Administered 2020-02-13: 100 mL via INTRAVENOUS

## 2020-02-13 NOTE — Telephone Encounter (Signed)
Per los 02/13/20 - patient has schedule of upcoming appts and picked up contrast @ [facility]

## 2020-02-13 NOTE — Progress Notes (Signed)
Hematology and Oncology Follow Up Visit  Carol Quinn [id number] 01-28-1955 65 y.o. 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  We see her every 6 months.  So far, things are going pretty well for her.  She had a wonderful Christmas and New Year's.  She was with her family.  Thankfully, she has had no further trouble with the left eye.  Letter that she may have had a little bit of a vascular issue with the eye back in the summertime.  We work this up.  Hypercoagulable study was unremarkable.  She has had a CT scan done today.  The CT scan did not show any evidence of recurrent melanoma.  She has had no change in bowel or bladder habits.  She is overdue for her mammogram.  She will get this scheduled.  She has had no rashes.  There is been no leg swelling.  She does have a bad COPD.  She has been monitored by pulmonary for this.  Overall, her performance status is ECOG 1.    Medications:  Allergies as of 02/13/2020      Reactions   Clarithromycin Diarrhea, Rash, Other (See Comments)   Severe diarrhea  Rash Other reaction(s): DIARRHEA   Hydrocodone-homatropine Other (See Comments)   paranoia   Hydrocodone-homatropine Other (See Comments)   paranoia   Penicillins Other (See Comments)   Unknown reaction, childhood allergy Has patient had a PCN reaction causing immediate rash, facial/tongue/throat swelling, SOB or lightheadedness with hypotension: Unknown Has patient had a PCN reaction causing severe rash involving mucus membranes or skin necrosis: Unknown Has patient had a PCN reaction that required hospitalization: Unknown Has patient had a PCN reaction occurring within the last 10 years: No If all of the above answers are "NO", then  may proceed with Cephalosporin use. unknown Unknown reaction, childhood allergy   Topiramate Other (See Comments)   WEIGHT LOSS Other reaction(s): WEIGHT LOSS WEIGHT LOSS   Codeine Itching      Medication List       Accurate as of February 13, 2020 10:17 AM. If you have any questions, ask your nurse or doctor.        albuterol (2.5 MG/3ML) 0.083% nebulizer solution Commonly known as: PROVENTIL Take 3 mLs (2.5 mg total) by nebulization every 6 (six) hours as needed for wheezing or shortness of breath.   ALPRAZolam 0.5 MG tablet Commonly known as: XANAX Take 1 tablet (0.5 mg total) by mouth 2 (two) times daily as needed for anxiety.   atorvastatin 40 MG tablet Commonly known as: LIPITOR Take 40 mg by mouth daily.   buPROPion 150 MG 24 hr tablet Commonly known as: WELLBUTRIN XL Take 1 tablet (150 mg total) by mouth every morning.   dorzolamide 2 % ophthalmic solution Commonly known as: TRUSOPT Apply to eye.   escitalopram 20 MG tablet Commonly known as: LEXAPRO TAKE 1 TABLET BY MOUTH DAILY   estradiol 0.0375 MG/24HR Commonly known as: VIVELLE-DOT Place onto the skin. What changed: Another medication with the same name was removed. Continue taking this medication, and follow the directions you see here. Changed by: [name], MD   fexofenadine 180 MG tablet Commonly known as: ALLEGRA Take 180 mg by mouth daily.   Fish Oil 1200 MG Caps  Take by mouth.   Flutter Devi Use as directed   LYSINE PO Take by mouth daily.   modafinil 200 MG tablet Commonly known as: PROVIGIL TAKE 1 TABLET BY MOUTH DAILY   montelukast 10 MG tablet Commonly known as: SINGULAIR TAKE 1 TABLET BY MOUTH ONCE DAILY AT BEDTIME   naphazoline-pheniramine 0.025-0.3 % ophthalmic solution Commonly known as: NAPHCON-A Place into both eyes.   omeprazole 40 MG capsule Commonly known as: PRILOSEC TAKE 1 CAPSULE BY MOUTH DAILY   PreviDent 5000 Booster Plus 1.1 % Pste Generic drug:  Sodium Fluoride   Trelegy Ellipta 100-62.5-25 MCG/INH Aepb Generic drug: Fluticasone-Umeclidin-Vilant Inhale 1 puff into the lungs daily.   valsartan-hydrochlorothiazide 160-25 MG tablet Commonly known as: DIOVAN-HCT TAKE 1 TABLET BY MOUTH DAILY       Allergies:  Allergies  Allergen Reactions  . Clarithromycin Diarrhea, Rash and Other (See Comments)    Severe diarrhea  Rash Other reaction(s): DIARRHEA  . Hydrocodone-Homatropine Other (See Comments)    paranoia  . Hydrocodone-Homatropine Other (See Comments)    paranoia  . Penicillins Other (See Comments)    Unknown reaction, childhood allergy Has patient had a PCN reaction causing immediate rash, facial/tongue/throat swelling, SOB or lightheadedness with hypotension: Unknown Has patient had a PCN reaction causing severe rash involving mucus membranes or skin necrosis: Unknown Has patient had a PCN reaction that required hospitalization: Unknown Has patient had a PCN reaction occurring within the last 10 years: No If all of the above answers are "NO", then may proceed with Cephalosporin use.  unknown Unknown reaction, childhood allergy  . Topiramate Other (See Comments)    WEIGHT LOSS Other reaction(s): WEIGHT LOSS WEIGHT LOSS  . Codeine Itching    Past Medical History, Surgical history, Social history, and Family History were reviewed and updated.  Review of Systems: Review of Systems  Constitutional: Negative.   HENT: Negative.   Eyes: Negative.   Respiratory: Negative.   Cardiovascular: Negative.   Gastrointestinal: Positive for abdominal pain.  Genitourinary: Negative.   Musculoskeletal: Negative.   Skin: Negative.   Neurological: Negative.   Endo/Heme/Allergies: Negative.   Psychiatric/Behavioral: Negative.       Physical Exam:  height is 5\' 2"  (1.575 m) and weight is 159 lb (72.1 kg). Her oral temperature is 98.6 F (37 C). Her blood pressure is 127/72 and her pulse is 89. Her respiration is 20 and  oxygen saturation is 96%.   Wt Readings from Last 3 Encounters:  02/13/20 159 lb (72.1 kg)  12/09/19 157 lb (71.2 kg)  11/21/19 157 lb (71.2 kg)    Physical Exam Vitals reviewed.  HENT:     Head: Normocephalic and atraumatic.  Eyes:     Pupils: Pupils are equal, round, and reactive to light.     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  Left eye is unremarkable.  She has good extraocular muscle movement of the left eye.  Cardiovascular:     Rate and Rhythm: Normal rate and regular rhythm.     Heart sounds: Normal heart sounds.  Pulmonary:     Effort: Pulmonary effort is normal.     Breath sounds: Normal breath sounds.  Abdominal:     General: Bowel sounds are normal.     Palpations: Abdomen is soft.  Musculoskeletal:        General: No tenderness or deformity. Normal range of motion.     Cervical back: Normal range of motion.  Lymphadenopathy:     Cervical:  No cervical adenopathy.  Skin:    General: Skin is warm and dry.     Findings: No erythema or rash.  Neurological:     Mental Status: She is alert and oriented to person, place, and time.  Psychiatric:        Behavior: Behavior normal.        Thought Content: Thought content normal.        Judgment: Judgment normal.      Lab Results  Component Value Date   WBC 8.2 02/13/2020   HGB 13.8 02/13/2020   HCT 42.2 02/13/2020   MCV 97.7 02/13/2020   PLT 298 02/13/2020   No results found for: FERRITIN, IRON, TIBC, UIBC, IRONPCTSAT Lab Results  Component Value Date   RBC 4.32 02/13/2020   No results found for: KPAFRELGTCHN, LAMBDASER, KAPLAMBRATIO No results found for: IGGSERUM, IGA, IGMSERUM No results found for: Odetta Pink, SPEI   Chemistry      Component Value Date/Time   NA 142 02/13/2020 0827   NA 143 12/23/2016 0848   K 4.0 02/13/2020 0827   K 4.3 12/23/2016 0848   CL 101 02/13/2020 0827   CL 99 12/23/2016 0848   CO2 35 (H)  02/13/2020 0827   CO2 31 12/23/2016 0848   BUN 16 02/13/2020 0827   BUN 10 12/23/2016 0848   CREATININE 0.71 02/13/2020 0827   CREATININE 1.0 12/23/2016 0848      Component Value Date/Time   CALCIUM 9.8 02/13/2020 0827   CALCIUM 9.4 12/23/2016 0848   ALKPHOS 83 02/13/2020 0827   ALKPHOS 99 (H) 12/23/2016 0848   AST 19 02/13/2020 0827   ALT 28 02/13/2020 0827   ALT 26 12/23/2016 0848   BILITOT 0.5 02/13/2020 0827      Impression and Plan: Ms. Mall is a very pleasant 65 yo caucasian female with a choroidal melanoma of the right eye with right eye enucleation in October 2017.    As far as the ocular melanoma goes of the right eye, I do not see any evidence of recurrent disease.  We will do one final CT scan on her in 6 months.  At that time, she will be 5 years out from her right I enucleation.  I am just happy that her quality of life is doing well.    [name], MD 2/3/202210:17 AM

## 2020-03-03 ENCOUNTER — Other Ambulatory Visit (HOSPITAL_BASED_OUTPATIENT_CLINIC_OR_DEPARTMENT_OTHER): Payer: Self-pay | Admitting: Obstetrics and Gynecology

## 2020-03-03 DIAGNOSIS — Z1231 Encounter for screening mammogram for malignant neoplasm of breast: Secondary | ICD-10-CM

## 2020-03-10 ENCOUNTER — Ambulatory Visit (HOSPITAL_BASED_OUTPATIENT_CLINIC_OR_DEPARTMENT_OTHER)
Admission: RE | Admit: 2020-03-10 | Discharge: 2020-03-10 | Disposition: A | Discharge status: Home / Self Care | Payer: Medicare Other | Source: Ambulatory Visit | Attending: Obstetrics and Gynecology | Admitting: Obstetrics and Gynecology

## 2020-03-10 ENCOUNTER — Other Ambulatory Visit: Payer: Self-pay

## 2020-03-10 ENCOUNTER — Encounter (HOSPITAL_BASED_OUTPATIENT_CLINIC_OR_DEPARTMENT_OTHER): Payer: Self-pay

## 2020-03-10 DIAGNOSIS — Z1231 Encounter for screening mammogram for malignant neoplasm of breast: Secondary | ICD-10-CM | POA: Diagnosis not present

## 2020-03-16 DIAGNOSIS — H6983 Other specified disorders of Eustachian tube, bilateral: Secondary | ICD-10-CM | POA: Insufficient documentation

## 2020-03-18 NOTE — Progress Notes (Signed)
Cardiology Office Note   Date:  03/19/2020   ID:  Carol Quinn, DOB 1955/11/05, MRN [id number]  PCP:  Midge Minium, MD  Cardiologist:   Minus Breeding, MD  Referring:  Midge Minium, MD   Chief Complaint  Patient presents with  . Shortness of Breath      History of Present Illness: Carol Quinn is a 65 y.o. female who is referred by Midge Minium, MD for evaluation of retinal artery occlusion..  She had chest pain in 2016 and she had a negative Lexiscan Myoview.  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  She is now lost 25% vision in her left eye.    Since I last saw her she has done okay.  She has chronic shortness of breath from emphysema.  She wears O2 at night.  We evaluated this with a perfusion study in 2016 and there was no ischemia.  It really has not changed since then but it does limit her exercise.  She has had no change in her vision.  She has had no palpitations, presyncope or syncope.  She denies any chest pressure, neck or arm discomfort.  She is not describing PND or orthopnea.    Past Medical History:  Diagnosis Date  . Acute bronchitis   . Allergic rhinitis   . Anxiety   . Aortic atherosclerosis ([place])   . Cancer ([place])   . Chronic headaches   . COPD (chronic obstructive pulmonary disease) ([facility])    emphysema  . Depression   . Emphysema, unspecified ([place])   . Exposure to TB    uncle-her PPD neg  . GERD (gastroesophageal reflux disease)   . Hepatic cyst    left  . Hypertension   . MDS (myelodysplastic syndrome), low grade ([place]) 09/23/2016  . 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 RBBB (right bundle branch block with left anterior fascicular block)     Past Surgical History:  Procedure Laterality Date  . BREAST BIOPSY Left 2002  . COLONOSCOPY WITH PROPOFOL N/A 03/21/2017   Procedure: COLONOSCOPY WITH PROPOFOL;  Surgeon: Jerene Bears, MD;  Location: [facility];  Service: Gastroenterology;   Laterality: N/A;  . NOSE SURGERY     "nasal deviation"  . TOTAL ABDOMINAL HYSTERECTOMY    . TRIGGER FINGER RELEASE Right 06/05/2018   Procedure: RIGHT THUMB RELEASE TRIGGER FINGER/A-1 PULLEY;  Surgeon: Daryll Brod, MD;  Location: [facility];  Service: Orthopedics;  Laterality: Right;  . TUBAL LIGATION       Current Outpatient Medications  Medication Sig Dispense Refill  . albuterol (PROVENTIL) (2.5 MG/3ML) 0.083% nebulizer solution Take 3 mLs (2.5 mg total) by nebulization every 6 (six) hours as needed for wheezing or shortness of breath. 150 mL 11  . ALPRAZolam (XANAX) 0.5 MG tablet Take 1 tablet (0.5 mg total) by mouth 2 (two) times daily as needed for anxiety. 30 tablet 1  . atorvastatin (LIPITOR) 40 MG tablet Take 40 mg by mouth daily.    Marland Kitchen buPROPion (WELLBUTRIN XL) 150 MG 24 hr tablet Take 1 tablet (150 mg total) by mouth every morning. 90 tablet 1  . escitalopram (LEXAPRO) 20 MG tablet TAKE 1 TABLET BY MOUTH DAILY 30 tablet 6  . estradiol (VIVELLE-DOT) 0.0375 MG/24HR Place onto the skin.    . fexofenadine (ALLEGRA) 180 MG tablet Take 180 mg by mouth daily.    . fluticasone (FLONASE) 50 MCG/ACT nasal spray  Place into the nose.    Marland Kitchen Fluticasone-Umeclidin-Vilant (TRELEGY ELLIPTA) 100-62.5-25 MCG/INH AEPB Inhale 1 puff into the lungs daily. 60 each 11  . LYSINE PO Take by mouth daily.    . modafinil (PROVIGIL) 200 MG tablet TAKE 1 TABLET BY MOUTH DAILY 30 tablet 5  . montelukast (SINGULAIR) 10 MG tablet TAKE 1 TABLET BY MOUTH ONCE DAILY AT BEDTIME 30 tablet 3  . naphazoline-pheniramine (NAPHCON-A) 0.025-0.3 % ophthalmic solution Place into both eyes.    Marland Kitchen omeprazole (PRILOSEC) 40 MG capsule TAKE 1 CAPSULE BY MOUTH DAILY 30 capsule 6  . PREVIDENT 5000 BOOSTER PLUS 1.1 % PSTE     . Respiratory Therapy Supplies (FLUTTER) DEVI Use as directed 1 each 0  . valsartan-hydrochlorothiazide (DIOVAN-HCT) 160-25 MG tablet TAKE 1 TABLET BY MOUTH DAILY 90 tablet 1  . dorzolamide  (TRUSOPT) 2 % ophthalmic solution Apply to eye. (Patient not taking: No sig reported)    . Omega-3 Fatty Acids (FISH OIL) 1200 MG CAPS Take by mouth. (Patient not taking: No sig reported)     No current facility-administered medications for this visit.    Allergies:   Clarithromycin, Hydrocodone-homatropine, Hydrocodone-homatropine, Penicillins, Topiramate, and Codeine    ROS:  Please see the history of present illness.   Otherwise, review of systems are positive for none.   All other systems are reviewed and negative.    PHYSICAL EXAM: VS:  BP 122/76 (BP Location: Left Arm, Patient Position: Sitting)   Pulse 72   Ht 5\' 2"  (1.575 m)   Wt 157 lb (71.2 kg)   SpO2 96%   BMI 28.72 kg/m  , BMI Body mass index is 28.72 kg/m. GENERAL:  Well appearing NECK:  No jugular venous distention, waveform within normal limits, carotid upstroke brisk and symmetric, no bruits, no thyromegaly LUNGS:  Clear to auscultation bilaterally CHEST:  Unremarkable HEART:  PMI not displaced or sustained,S1 and S2 within normal limits, no S3, no S4, no clicks, no rubs, no murmurs ABD:  Flat, positive bowel sounds normal in frequency in pitch, no bruits, no rebound, no guarding, no midline pulsatile mass, no hepatomegaly, no splenomegaly EXT:  2 plus pulses throughout, no edema, no cyanosis no clubbing  EKG:  EKG is  ordered today. The ekg ordered today demonstrates sinus rhythm, rate 71, right bundle branch block (old), no acute ST-T wave changes.   Recent Labs: 11/21/2019: TSH 2.71 02/13/2020: ALT 28; BUN 16; Creatinine 0.71; Hemoglobin 13.8; Platelet Count 298; Potassium 4.0; Sodium 142    Lipid Panel    Component Value Date/Time   CHOL 145 11/21/2019 1050   TRIG 124.0 11/21/2019 1050   HDL 63.50 11/21/2019 1050   CHOLHDL 2 11/21/2019 1050   VLDL 24.8 11/21/2019 1050   LDLCALC 57 11/21/2019 1050   LDLDIRECT 126.9 12/21/2012 1148      Wt Readings from Last 3 Encounters:  03/19/20 157 lb (71.2 kg)   02/13/20 159 lb (72.1 kg)  12/09/19 157 lb (71.2 kg)      Other studies Reviewed: Additional studies/ records that were reviewed today include: Care Everywhere Review of the above records demonstrates:  Please see elsewhere in the note.     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   I asked her to follow-up on the [facility] monitor she has back in June.  I was never able to pull this but she can  see the results on her phone but cannot pull up the full report.  She will do this from her home to make sure there is no evidence of atrial fibrillation.  This has not been ever documented for the likely culprit but she would let me know if she ever has any palpitations.  She has an Audiological scientist.  She is also had a negative hypercoagulable work-up as there is a family history with her nephew having a clotting disorder.  She is followed routinely by ophthalmology.   LEFT MCA ANEURYSM:    This was stable I Nov and she will repeat this in Nov 2022.    LEG PAIN:   He had normal ABIs.  No change in therapy.  DYSLIPIDEMIA:    The last LDL was 57 with an HDL of 63.5.  No change in therapy.hs.  HTN:   The blood pressure is well controlled.  She will continue the meds as listed.  Current medicines are reviewed at length with the patient today.  The patient does not have concerns regarding medicines.  The following changes have been made:  None  Labs/ tests ordered today include:   None  Orders Placed This Encounter  Procedures  . EKG 12-Lead     Disposition:   FU with me in 12 months.    Signed, Minus Breeding, MD  03/19/2020 1:09 PM    [facility]

## 2020-03-19 ENCOUNTER — Ambulatory Visit (INDEPENDENT_AMBULATORY_CARE_PROVIDER_SITE_OTHER): Payer: Medicare Other | Admitting: Cardiology

## 2020-03-19 ENCOUNTER — Encounter: Payer: Self-pay | Admitting: Cardiology

## 2020-03-19 ENCOUNTER — Other Ambulatory Visit: Payer: Self-pay

## 2020-03-19 VITALS — BP 122/76 | HR 72 | Ht 62.0 in | Wt 157.0 lb

## 2020-03-19 DIAGNOSIS — E785 Hyperlipidemia, unspecified: Secondary | ICD-10-CM | POA: Diagnosis not present

## 2020-03-19 DIAGNOSIS — I671 Cerebral aneurysm, nonruptured: Secondary | ICD-10-CM | POA: Diagnosis not present

## 2020-03-19 DIAGNOSIS — H349 Unspecified retinal vascular occlusion: Secondary | ICD-10-CM

## 2020-03-19 DIAGNOSIS — I10 Essential (primary) hypertension: Secondary | ICD-10-CM

## 2020-03-19 NOTE — Patient Instructions (Signed)
Medication Instructions:  Continue current medications  *If you need a refill on your cardiac medications before your next appointment, please call your pharmacy*   Lab Work: None ordered   Testing/Procedures: None ordered   Follow-Up: At [facility], you and your health needs are our priority.  As part of our continuing mission to provide you with exceptional heart care, we have created designated Provider Care Teams.  These Care Teams include your primary Cardiologist (physician) and Advanced Practice Providers (APPs -  Physician Assistants and Nurse Practitioners) who all work together to provide you with the care you need, when you need it.  We recommend signing up for the patient portal called "MyChart".  Sign up information is provided on this After Visit Summary.  MyChart is used to connect with patients for Virtual Visits (Telemedicine).  Patients are able to view lab/test results, encounter notes, upcoming appointments, etc.  Non-urgent messages can be sent to your provider as well.   To learn more about what you can do with MyChart, go to [email or web address].    Your next appointment:   1 year(s)  The format for your next appointment:   In Person  Provider:   You may see James Hochrein, MD or one of the following Advanced Practice Providers on your designated Care Team:    Rhonda Barrett, PA-C  Kathryn Lawrence, DNP, ANP      

## 2020-03-27 ENCOUNTER — Other Ambulatory Visit: Payer: Self-pay | Admitting: Family Medicine

## 2020-03-30 NOTE — Telephone Encounter (Signed)
Last refill 11/26/2019 Last OV 11/21/2019 dx Medical examination

## 2020-03-31 ENCOUNTER — Other Ambulatory Visit: Payer: Self-pay | Admitting: Internal Medicine

## 2020-04-11 ENCOUNTER — Other Ambulatory Visit: Payer: Self-pay | Admitting: Family Medicine

## 2020-04-27 ENCOUNTER — Other Ambulatory Visit: Payer: Self-pay | Admitting: Family Medicine

## 2020-04-28 ENCOUNTER — Other Ambulatory Visit: Payer: Self-pay | Admitting: Internal Medicine

## 2020-04-28 NOTE — Progress Notes (Signed)
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 Going to [employer] No acute changes in breathing. Still easily DOE, worse with bending, lifting,hills and stairs. Needs HC Parking and brings that and disability form for me to fill out.  Reviewed recent CT. Still followed by Oncology.   CT-chest 08/23/19 IMPRESSION: No evidence of metastatic disease in the chest, abdomen, or pelvis. Aortic Atherosclerosis (ICD10-I70.0) and Emphysema (ICD10-J43.9).  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 Going to [facility],     Dr  [facility].  ENT eval for bilateral eustachian dysfunction.> expected gradual improvement. -----Patient states that her breathing is bad right now and she has been coughing up green sputum since this weekend. Wears oxygen at night and as needed with exertion. Dr Jonette Eva has done a wonderful job of watching over her cancer f/u with no recurrence identified so far. She has blamed "allergy" since January for waxing and waning head and chest congestion and shortness of breath. Green sputum this week without fever or adenopathy. Mentions desire to lose weight. Very limited exercise capacity.  CT chest 02/13/20- IMPRESSION: 1. No evidence of metastatic disease within the chest, abdomen, or pelvis. 2. Severe emphysema. 3. Coronary artery disease. Aortic Atherosclerosis (ICD10-I70.0) and Emphysema (ICD10-J43.9).  ROS-see HPI   + = positive Constitutional:   No-   weight loss, night sweats, fevers, chills, +fatigue, lassitude. HEENT:   No-  headaches, difficulty swallowing, tooth/dental problems, sore throat,       No-  sneezing, itching, ear ache, +nasal congestion, +post nasal drip,  CV:  No-   chest pain, orthopnea, PND, swelling in lower extremities, anasarca, dizziness, palpitations Resp: + shortness of breath with exertion or at rest.                +productive cough,  + non-productive cough,  No- coughing up of blood.              +change in color of mucus.  + wheezing.   Skin: No-   rash or lesions. GI:  No-   heartburn, indigestion, abdominal pain, nausea, vomiting,  GU:  MS:  No-   joint pain or swelling.   Neuro-     nothing unusual Psych:  No- change in mood or affect. No depression or anxiety.  No memory loss.  OBJ- Physical Exam    +Room air here today- 86% walking from waiting room, rapidly up  to 93% at rest.  General- Alert, Oriented, Affect-appropriate,  + overweight Skin- clear  Lymphadenopathy- none Head- atraumatic            Eyes- + R prosthesis            Ears-  Hearing, canals-normal            Nose- clear, no-Septal dev, mucus, polyps, erosion, perforation             Throat- Mallampati IV , mucosa clear/  drainage- none, tonsils- small residual Neck- flexible , trachea midline, no stridor , thyroid nl, carotid no bruit Chest - symmetrical excursion , unlabored           Heart/CV- RRR , no murmur , no gallop  , no rub, nl s1 s2                           - JVD- none , edema- none, stasis changes- none, varices- none           Lung-  +distant/ clear, Wheeze- none, cough +slight dry , dullness-none, rub- none,             Chest wall-  Abd-  Br/ Gen/ Rectal- Not done, not indicated Extrem- cyanosis- none, clubbing, none, atrophy- none, strength- nl Neuro- grossly intact to observation

## 2020-04-28 NOTE — Telephone Encounter (Signed)
Last office visit:10/30/19 Last refill: 11/12/19

## 2020-04-28 NOTE — Telephone Encounter (Signed)
Modafinil refill sent to [facility]

## 2020-04-29 ENCOUNTER — Encounter: Payer: Self-pay | Admitting: Internal Medicine

## 2020-04-29 ENCOUNTER — Other Ambulatory Visit: Payer: Self-pay

## 2020-04-29 ENCOUNTER — Ambulatory Visit (INDEPENDENT_AMBULATORY_CARE_PROVIDER_SITE_OTHER): Payer: Medicare Other | Admitting: Internal Medicine

## 2020-04-29 VITALS — BP 134/68 | HR 101 | Temp 98.0°F | Ht 62.0 in | Wt 155.8 lb

## 2020-04-29 DIAGNOSIS — J9611 Chronic respiratory failure with hypoxia: Secondary | ICD-10-CM | POA: Diagnosis not present

## 2020-04-29 DIAGNOSIS — J441 Chronic obstructive pulmonary disease with (acute) exacerbation: Secondary | ICD-10-CM

## 2020-04-29 DIAGNOSIS — E663 Overweight: Secondary | ICD-10-CM | POA: Diagnosis not present

## 2020-04-29 MED ORDER — PREDNISONE 10 MG PO TABS: ORAL_TABLET | ORAL | 0 refills | Status: DC

## 2020-04-29 MED ORDER — AZITHROMYCIN 250 MG PO TABS
ORAL_TABLET | ORAL | 0 refills | Status: DC
Start: 2020-04-29 — End: 2020-05-20

## 2020-04-29 MED ORDER — BREZTRI AEROSPHERE 160-9-4.8 MCG/ACT IN AERO
2.0000 | INHALATION_SPRAY | Freq: Two times a day (BID) | RESPIRATORY_TRACT | 0 refills | Status: DC
Start: 2020-04-29 — End: 2020-08-12

## 2020-04-29 NOTE — Patient Instructions (Addendum)
Order- sample x 2 Breztri inhaler    Inhale 2 puffs then rinse mouth, twice daily.   Try this instead of  Trelegy for comparison. If no better, then just go back to Trelegy. You can also ask your drug store which would be cheaper for you.   We can continue routine meds.   Suggest for nasal congestion/ stuffy ears- try Sudafed (6-12 hour form, not 24 hour). You have to ask and sign for this at Pharmacy counter.   Prednisone taper sent  Order- referral to [facility]

## 2020-04-29 NOTE — Assessment & Plan Note (Signed)
She would like to lose weight but exercise capacity is limited. She was interested in exploring [facility].

## 2020-04-29 NOTE — Assessment & Plan Note (Signed)
Desaturated to 86% on short walk on room air, with quick recovery. Has O2 including portable.  Plan- educated on continued need and use.

## 2020-04-29 NOTE — Assessment & Plan Note (Addendum)
Mild nonspecific exacerbation. Gradual progression is expected. Recent sputum change may indicate infection.  Plan- Zpak. Try samples of Breztri for comparison with Trelegy

## 2020-05-02 ENCOUNTER — Other Ambulatory Visit: Payer: Self-pay | Admitting: Internal Medicine

## 2020-05-05 ENCOUNTER — Other Ambulatory Visit: Payer: Self-pay | Admitting: Internal Medicine

## 2020-05-20 ENCOUNTER — Ambulatory Visit (INDEPENDENT_AMBULATORY_CARE_PROVIDER_SITE_OTHER): Payer: Medicare Other | Admitting: Family Medicine

## 2020-05-20 ENCOUNTER — Encounter: Payer: Self-pay | Admitting: Family Medicine

## 2020-05-20 ENCOUNTER — Other Ambulatory Visit: Payer: Self-pay

## 2020-05-20 VITALS — BP 120/82 | HR 80 | Temp 97.3°F | Ht 62.0 in | Wt 154.8 lb

## 2020-05-20 DIAGNOSIS — E785 Hyperlipidemia, unspecified: Secondary | ICD-10-CM | POA: Diagnosis not present

## 2020-05-20 DIAGNOSIS — F32A Depression, unspecified: Secondary | ICD-10-CM | POA: Diagnosis not present

## 2020-05-20 DIAGNOSIS — I10 Essential (primary) hypertension: Secondary | ICD-10-CM | POA: Diagnosis not present

## 2020-05-20 DIAGNOSIS — R519 Headache, unspecified: Secondary | ICD-10-CM | POA: Diagnosis not present

## 2020-05-20 LAB — HEPATIC FUNCTION PANEL
ALT: 31 U/L (ref 0–35)
AST: 17 U/L (ref 0–37)
Albumin: 4 g/dL (ref 3.5–5.2)
Alkaline Phosphatase: 81 U/L (ref 39–117)
Bilirubin, Direct: 0.1 mg/dL (ref 0.0–0.3)
Total Bilirubin: 0.5 mg/dL (ref 0.2–1.2)
Total Protein: 6 g/dL (ref 6.0–8.3)

## 2020-05-20 LAB — LIPID PANEL
Cholesterol: 153 mg/dL (ref 0–200)
HDL: 70.3 mg/dL (ref >39.00)
LDL Cholesterol: 63 mg/dL (ref 0–99)
NonHDL: 82.63
Total CHOL/HDL Ratio: 2
Triglycerides: 99 mg/dL (ref 0.0–149.0)
VLDL: 19.8 mg/dL (ref 0.0–40.0)

## 2020-05-20 LAB — CBC WITH DIFFERENTIAL/PLATELET
Basophils Absolute: 0.1 10*3/uL (ref 0.0–0.1)
Basophils Relative: 1.1 % (ref 0.0–3.0)
Eosinophils Absolute: 0.2 10*3/uL (ref 0.0–0.7)
Eosinophils Relative: 1.7 % (ref 0.0–5.0)
HCT: 42 % (ref 36.0–46.0)
Hemoglobin: 14 g/dL (ref 12.0–15.0)
Lymphocytes Relative: 14.6 % (ref 12.0–46.0)
Lymphs Abs: 1.7 10*3/uL (ref 0.7–4.0)
MCHC: 33.4 g/dL (ref 30.0–36.0)
MCV: 96.9 fl (ref 78.0–100.0)
Monocytes Absolute: 0.8 10*3/uL (ref 0.1–1.0)
Monocytes Relative: 6.7 % (ref 3.0–12.0)
Neutro Abs: 8.8 10*3/uL — ABNORMAL HIGH (ref 1.4–7.7)
Neutrophils Relative %: 75.9 % (ref 43.0–77.0)
Platelets: 301 10*3/uL (ref 150.0–400.0)
RBC: 4.33 Mil/uL (ref 3.87–5.11)
RDW: 13.6 % (ref 11.5–15.5)
WBC: 11.6 10*3/uL — ABNORMAL HIGH (ref 4.0–10.5)

## 2020-05-20 LAB — BASIC METABOLIC PANEL
BUN: 19 mg/dL (ref 6–23)
CO2: 35 mEq/L — ABNORMAL HIGH (ref 19–32)
Calcium: 9.5 mg/dL (ref 8.4–10.5)
Chloride: 97 mEq/L (ref 96–112)
Creatinine, Ser: 0.59 mg/dL (ref 0.40–1.20)
GFR: 95.94 mL/min (ref >60.00)
Glucose, Bld: 109 mg/dL — ABNORMAL HIGH (ref 70–99)
Potassium: 3.7 mEq/L (ref 3.5–5.1)
Sodium: 140 mEq/L (ref 135–145)

## 2020-05-20 LAB — TSH: TSH: 1.93 u[IU]/mL (ref 0.35–4.50)

## 2020-05-20 NOTE — Assessment & Plan Note (Signed)
Chronic problem.  Tolerating Lipitor w/o difficulty.  Check labs.  Adjust meds prn  

## 2020-05-20 NOTE — Assessment & Plan Note (Signed)
Currently stable.  Doing well on Lexapro daily and has rarely had to use the Alprazolam.  No changes at this time

## 2020-05-20 NOTE — Patient Instructions (Addendum)
Schedule your complete physical in 6 months We'll notify you of your lab results and make any changes if needed We'll call you with your Neuro appt regarding the headaches Take Tylenol or Excedrin Migraine as needed Drink plenty of water Call with any questions or concerns Have a great summer!!!

## 2020-05-20 NOTE — Assessment & Plan Note (Signed)
New.  Pt reports that these have been occurring regularly since her retinal artery occlusion.  She states they are not consistent w/ her previous migraines or HTN headaches.  She is concerned b/c she knows she has an aneurysm and fears that the HA could be a symptom of impending danger.  Will refer back to Neuro for complete evaluation and tx.

## 2020-05-20 NOTE — Progress Notes (Signed)
   Subjective:    Patient ID: Carol Quinn, female    DOB: 12-01-1955, 64 y.o.   MRN: [id number]  HPI HTN- chronic problem, on Valsartan HCTZ 160/25mg  daily w/ good control.  Denies CP, SOB, edema.  Hyperlipidemia- chronic problem, on Lipitor 40mg  daily.  No abd pain, N/V  Depression- pt reports mood has been stable on Lexapro 20mg  daily and she has had to rarely use the Alprazolam.  'but it works when I need it'  HAs- pt reports she has had increased HAs since she had her eye stroke.  Pain is different than her previous migraines.  A few weeks ago had a coughing fit and 'it was the worst pain I ever had'.  The change in HA and frequency are bothersome/concerning to her.  Review of Systems For ROS see HPI   This visit occurred during the SARS-CoV-2 public health emergency.  Safety protocols were in place, including screening questions prior to the visit, additional usage of staff PPE, and extensive cleaning of exam room while observing appropriate contact time as indicated for disinfecting solutions.       Objective:   Physical Exam Vitals reviewed.  Constitutional:      General: She is not in acute distress.    Appearance: She is well-developed. She is not ill-appearing.  HENT:     Head: Normocephalic and atraumatic.  Eyes:     Conjunctiva/sclera: Conjunctivae normal.  Neck:     Thyroid: No thyromegaly.  Cardiovascular:     Rate and Rhythm: Normal rate and regular rhythm.     Heart sounds: Normal heart sounds. No murmur heard.   Pulmonary:     Effort: Pulmonary effort is normal. No respiratory distress.     Breath sounds: Normal breath sounds.  Abdominal:     General: There is no distension.     Palpations: Abdomen is soft.     Tenderness: There is no abdominal tenderness.  Musculoskeletal:     Cervical back: Normal range of motion and neck supple.  Lymphadenopathy:     Cervical: No cervical adenopathy.  Skin:    General: Skin is warm and dry.  Neurological:      Mental Status: She is alert and oriented to person, place, and time. Mental status is at baseline.     Cranial Nerves: No cranial nerve deficit, dysarthria or facial asymmetry.     Sensory: No sensory deficit.     Motor: No weakness.     Gait: Gait normal.  Psychiatric:        Mood and Affect: Mood normal.        Speech: Speech normal.        Behavior: Behavior normal.           Assessment & Plan:

## 2020-05-20 NOTE — Assessment & Plan Note (Signed)
Chronic problem.  Currently well controlled.  Asymptomatic w/ exception of HAs but she states these don't feel like previous HTN headaches.  Check labs.  No anticipated med changes.

## 2020-06-01 ENCOUNTER — Other Ambulatory Visit: Payer: Self-pay | Admitting: Family Medicine

## 2020-06-01 ENCOUNTER — Other Ambulatory Visit: Payer: Self-pay | Admitting: Internal Medicine

## 2020-06-22 ENCOUNTER — Encounter: Payer: Self-pay | Admitting: Diagnostic Neuroimaging

## 2020-06-22 ENCOUNTER — Ambulatory Visit (INDEPENDENT_AMBULATORY_CARE_PROVIDER_SITE_OTHER): Payer: Medicare Other | Admitting: Diagnostic Neuroimaging

## 2020-06-22 VITALS — BP 115/72 | HR 84 | Ht 62.0 in | Wt 155.0 lb

## 2020-06-22 DIAGNOSIS — G4483 Primary cough headache: Secondary | ICD-10-CM

## 2020-06-22 DIAGNOSIS — H34232 Retinal artery branch occlusion, left eye: Secondary | ICD-10-CM | POA: Diagnosis not present

## 2020-06-22 DIAGNOSIS — I671 Cerebral aneurysm, nonruptured: Secondary | ICD-10-CM

## 2020-06-22 NOTE — Progress Notes (Signed)
[facility]  PATIENT: Carol Quinn DOB: 14-Oct-1955  REFERRING CLINICIAN: Midge Minium, MD HISTORY FROM: patient  REASON FOR VISIT: follow up   HISTORICAL  CHIEF COMPLAINT:  Chief Complaint  Patient presents with   Headache    Rm 7 "history of migraines, headaches returned but different; can have severe head pain on top of my head with coughing which turns into headache that remains for day, OTC doesn't help; PCP sent me here for treatment"     HISTORY OF PRESENT ILLNESS:   UPDATE (06/22/20, [facility]): Since last visit, doing well until Sept 2021; then weekly cough induced headaches (vertex, temporal; no other sxs; lasting hours after severe cough attacks). Different than prior migraines from childhood (left sided, vision flashing, nausea, sens to light / sound). MRA in Nov 2021 was stable.   PRIOR HPI (07/10/19): 65 year old female with left branch retinal artery occlusion.  History of hypertension, COPD, emphysema, prosthetic right eye.  On 06/23/2019 patient had sudden onset of vision loss in her left eye, inferior nasal quadrant, and went to the ER for evaluation.  Patient was transferred to another hospital for evaluation.  Stroke work-up was completed.  Patient was started on aspirin, statin was increased, and discharged home.  Since then patient has followed up with ophthalmology.  She was set up to see [place] in clinic.  Also has cardiology appointment.  Vision symptoms are stable.   REVIEW OF SYSTEMS: Full 14 system review of systems performed and negative with exception of: As per HPI.  ALLERGIES: Allergies  Allergen Reactions   Clarithromycin Diarrhea, Rash and Other (See Comments)    Severe diarrhea  Rash Other reaction(s): DIARRHEA   Hydrocodone Bit-Homatrop Mbr Other (See Comments)    paranoia   Hydrocodone Bit-Homatrop Mbr Other (See Comments)    paranoia   Penicillins Other (See Comments)    Unknown reaction, childhood allergy Has patient  had a PCN reaction causing immediate rash, facial/tongue/throat swelling, SOB or lightheadedness with hypotension: Unknown Has patient had a PCN reaction causing severe rash involving mucus membranes or skin necrosis: Unknown Has patient had a PCN reaction that required hospitalization: Unknown Has patient had a PCN reaction occurring within the last 10 years: No If all of the above answers are "NO", then may proceed with Cephalosporin use.  unknown Unknown reaction, childhood allergy   Topiramate Other (See Comments)    WEIGHT LOSS Other reaction(s): WEIGHT LOSS WEIGHT LOSS   Codeine Itching    HOME MEDICATIONS: Outpatient Medications Prior to Visit  Medication Sig Dispense Refill   albuterol (PROVENTIL) (2.5 MG/3ML) 0.083% nebulizer solution Take 3 mLs (2.5 mg total) by nebulization every 6 (six) hours as needed for wheezing or shortness of breath. 150 mL 11   ALPRAZolam (XANAX) 0.5 MG tablet TAKE 1 TABLET BY MOUTH 2 TIMES DAILY AS NEEDED FOR ANXIETY 30 tablet 1   atorvastatin (LIPITOR) 40 MG tablet Take 40 mg by mouth daily.     buPROPion (WELLBUTRIN XL) 150 MG 24 hr tablet Take 1 tablet (150 mg total) by mouth every morning. 90 tablet 1   escitalopram (LEXAPRO) 20 MG tablet TAKE 1 TABLET BY MOUTH DAILY 30 tablet 6   estradiol (VIVELLE-DOT) 0.0375 MG/24HR Place onto the skin.     fexofenadine (ALLEGRA) 180 MG tablet Take 180 mg by mouth daily.     LYSINE PO Take by mouth daily.     modafinil (PROVIGIL) 200 MG tablet TAKE 1 TABLET BY MOUTH DAILY 30 tablet 5  montelukast (SINGULAIR) 10 MG tablet TAKE 1 TABLET BY MOUTH ONCE DAILY AT BEDTIME 30 tablet 3   naphazoline-pheniramine (NAPHCON-A) 0.025-0.3 % ophthalmic solution Place into both eyes.     Omega-3 Fatty Acids (FISH OIL) 1200 MG CAPS Take by mouth.     omeprazole (PRILOSEC) 40 MG capsule TAKE 1 CAPSULE BY MOUTH DAILY 30 capsule 6   PREVIDENT 5000 BOOSTER PLUS 1.1 % PSTE      Respiratory Therapy Supplies (FLUTTER) DEVI Use as  directed 1 each 0   TRELEGY ELLIPTA 100-62.5-25 MCG/INH AEPB INHALE 1 PUFF INTO THE LUNGS DAILY 60 each 11   valsartan-hydrochlorothiazide (DIOVAN-HCT) 160-25 MG tablet TAKE 1 TABLET BY MOUTH DAILY 90 tablet 1   Budeson-Glycopyrrol-Formoterol (BREZTRI AEROSPHERE) 160-9-4.8 MCG/ACT AERO Inhale 2 puffs into the lungs 2 (two) times daily. (Patient not taking: Reported on 06/22/2020) 10.7 g 0   dorzolamide (TRUSOPT) 2 % ophthalmic solution Apply to eye. (Patient not taking: Reported on 06/22/2020)     fluticasone (FLONASE) 50 MCG/ACT nasal spray Place into the nose.     predniSONE (DELTASONE) 10 MG tablet 4 X 2 DAYS, 3 X 2 DAYS, 2 X 2 DAYS, 1 X 2 DAYS (Patient not taking: No sig reported) 20 tablet 0   No facility-administered medications prior to visit.    PAST MEDICAL HISTORY: Past Medical History:  Diagnosis Date   Acute bronchitis    Allergic rhinitis    Anxiety    Aortic atherosclerosis (HCC)    Cancer (HCC)    Chronic headaches    COPD (chronic obstructive pulmonary disease) (HCC)    emphysema   Depression    Emphysema, unspecified (HCC)    Exposure to TB    uncle-her PPD neg   GERD (gastroesophageal reflux disease)    Hepatic cyst    left   Hypertension    MDS (myelodysplastic syndrome), low grade ([place]) 09/23/2016   Ocular melanoma, right ([place])    has prosthesis   RBBB (right bundle branch block with left anterior fascicular block)     PAST SURGICAL HISTORY: Past Surgical History:  Procedure Laterality Date   BREAST BIOPSY Left 2002   COLONOSCOPY WITH PROPOFOL N/A 03/21/2017   Procedure: COLONOSCOPY WITH PROPOFOL;  Surgeon: Jerene Bears, MD;  Location: [facility];  Service: Gastroenterology;  Laterality: N/A;   NOSE SURGERY     "nasal deviation"   TOTAL ABDOMINAL HYSTERECTOMY     TRIGGER FINGER RELEASE Right 06/05/2018   Procedure: RIGHT THUMB RELEASE TRIGGER FINGER/A-1 PULLEY;  Surgeon: Daryll Brod, MD;  Location: [place];  Service: Orthopedics;   Laterality: Right;   TUBAL LIGATION      FAMILY HISTORY: Family History  Problem Relation Age of Onset   Emphysema Mother    Heart attack Mother 74       Died age 67   Heart failure Father 64   Asthma Sister    Breast cancer Sister 19   Cancer Sister    Leukemia Brother    Asthma Maternal Uncle    Arthritis Maternal Grandmother    Cancer Maternal Grandmother    Breast cancer Maternal Grandmother    Cancer Other        aunts   Cancer Other        uncles   Colon cancer Other     SOCIAL HISTORY: Social History   Socioeconomic History   Marital status: Married    Spouse name: Remo Lipps   Number of children: Not on file  Years of education: Not on file   Highest education level: Associate degree: academic program  Occupational History   Occupation: Database administrator    Comment: Start mental health  Tobacco Use   Smoking status: Former    Packs/day: 0.50    Years: 40.00    Pack years: 20.00    Types: Cigarettes    Quit date: 11/27/2017    Years since quitting: 2.5   Smokeless tobacco: Never   Tobacco comments:    husband still smokes  Vaping Use   Vaping Use: Former  Substance and Sexual Activity   Alcohol use: Yes    Alcohol/week: 0.0 standard drinks    Comment: social   Drug use: No   Sexual activity: Not on file  Other Topics Concern   Not on file  Social History Narrative   Lives with husband.     Social Determinants of Health   Financial Resource Strain: Low Risk    Difficulty of Paying Living Expenses: Not hard at all  Food Insecurity: No Food Insecurity   Worried About Charity fundraiser in the Last Year: Never true   [place] in the Last Year: Never true  Transportation Needs: No Transportation Needs   Lack of Transportation (Medical): No   Lack of Transportation (Non-Medical): No  Physical Activity: Insufficiently Active   Days of Exercise per Week: 4 days   Minutes of Exercise per Session: 20 min  Stress: Stress Concern  Present   Feeling of Stress : To some extent  Social Connections: Moderately Integrated   Frequency of Communication with Friends and Family: More than three times a week   Frequency of Social Gatherings with Friends and Family: Once a week   Attends Religious Services: 1 to 4 times per year   Active Member of [employer] or Organizations: No   Attends Archivist Meetings: Never   Marital Status: Married  Human resources officer Violence: Not At Risk   Fear of Current or Ex-Partner: No   Emotionally Abused: No   Physically Abused: No   Sexually Abused: No     PHYSICAL EXAM  GENERAL EXAM/CONSTITUTIONAL: Vitals:  Vitals:   06/22/20 1358  BP: 115/72  Pulse: 84  Weight: 155 lb (70.3 kg)  Height: 5\' 2"  (1.575 m)   Body mass index is 28.35 kg/m. Wt Readings from Last 3 Encounters:  06/22/20 155 lb (70.3 kg)  05/20/20 154 lb 12.8 oz (70.2 kg)  04/29/20 155 lb 12.8 oz (70.7 kg)   Patient is in no distress; well developed, nourished and groomed; neck is supple  CARDIOVASCULAR: Examination of carotid arteries is normal; no carotid bruits Regular rate and rhythm, no murmurs Examination of peripheral vascular system by observation and palpation is normal  EYES: RIGHT GLOBE PROSTHESIS LEFT EYE PUPIL REACTIVE No results found.  MUSCULOSKELETAL: Gait, strength, tone, movements noted in Neurologic exam below  NEUROLOGIC: MENTAL STATUS:  No flowsheet data found. awake, alert, oriented to person, place and time recent and remote memory intact normal attention and concentration language fluent, comprehension intact, naming intact fund of knowledge appropriate  CRANIAL NERVE:  2nd - no papilledema on fundoscopic exam ON LEFT 2nd, 3rd, 4th, 6th - LEFT pupil reactive to light, LEFT EYE DECR VISION IN RIGHT INFERIOR QUADRANT; extraocular muscles intact ON LEFT, no nystagmus; RIGHT GLOBE PROSTHESIS 5th - facial sensation symmetric 7th - facial strength symmetric 8th - hearing  intact 9th - palate elevates symmetrically, uvula midline 11th - shoulder shrug  symmetric 12th - tongue protrusion midline  MOTOR:  normal bulk and tone, full strength in the BUE, BLE  SENSORY:  normal and symmetric to light touch, temperature, vibration  COORDINATION:  finger-nose-finger, fine finger movements normal  REFLEXES:  deep tendon reflexes present and symmetric  GAIT/STATION:  narrow based gait     DIAGNOSTIC DATA (LABS, IMAGING, TESTING) - I reviewed patient records, labs, notes, testing and imaging myself where available.  Lab Results  Component Value Date   WBC 11.6 (H) 05/20/2020   HGB 14.0 05/20/2020   HCT 42.0 05/20/2020   MCV 96.9 05/20/2020   PLT 301.0 05/20/2020      Component Value Date/Time   NA 140 05/20/2020 1112   NA 143 12/23/2016 0848   K 3.7 05/20/2020 1112   K 4.3 12/23/2016 0848   CL 97 05/20/2020 1112   CL 99 12/23/2016 0848   CO2 35 (H) 05/20/2020 1112   CO2 31 12/23/2016 0848   GLUCOSE 109 (H) 05/20/2020 1112   GLUCOSE 89 12/23/2016 0848   BUN 19 05/20/2020 1112   BUN 10 12/23/2016 0848   CREATININE 0.59 05/20/2020 1112   CREATININE 0.71 02/13/2020 0827   CREATININE 1.0 12/23/2016 0848   CALCIUM 9.5 05/20/2020 1112   CALCIUM 9.4 12/23/2016 0848   PROT 6.0 05/20/2020 1112   PROT 6.3 (L) 12/23/2016 0848   ALBUMIN 4.0 05/20/2020 1112   ALBUMIN 3.6 12/23/2016 0848   AST 17 05/20/2020 1112   AST 19 02/13/2020 0827   ALT 31 05/20/2020 1112   ALT 28 02/13/2020 0827   ALT 26 12/23/2016 0848   ALKPHOS 81 05/20/2020 1112   ALKPHOS 99 (H) 12/23/2016 0848   BILITOT 0.5 05/20/2020 1112   BILITOT 0.5 02/13/2020 0827   GFRNONAA >60 02/13/2020 0827   GFRAA >60 10/11/2019 0848   Lab Results  Component Value Date   CHOL 153 05/20/2020   HDL 70.30 05/20/2020   LDLCALC 63 05/20/2020   LDLDIRECT 126.9 12/21/2012   TRIG 99.0 05/20/2020   CHOLHDL 2 05/20/2020   Lab Results  Component Value Date   HGBA1C 6.1 05/23/2019   Lab  Results  Component Value Date   VITAMINB12 489 05/17/2013   Lab Results  Component Value Date   TSH 1.93 05/20/2020     06/24/19 MRI brain 1.  No acute intracranial abnormality. Specifically, no evidence of acute infarct.  2.  Age-advanced patchy white matter signal abnormality which is nonspecific but typically attributed to small vessel disease.  06/24/19 MRI orbits 1.  Unremarkable noncontrast MRI appearance of the left orbit.  2.  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  06/24/19 TTE Injection of agitated saline contrast performed to evaluate for  possible shunt  The left ventricular size is normal.  Left ventricular systolic function is normal.  LV ejection fraction = 65-70%.  The left ventricular wall motion is normal.  Few bubbles seen after 6-7 cardiac cycles suggesting minimal  intrapulmonary shunt.  No significant valvular disease  The aortic sinus is normal size.  IVC size was normal.  There is trivial pericardial effusion.  There is no comparison study available.   06/23/19 CTA head / perfusion 1. No emergent finding.  2. No infarct by non contrast CT or perfusion.  3. Atherosclerosis without large vessel occlusion or flow limiting stenosis of major vessels.  4. 5 mm left MCA bifurcation aneurysm.  5. Emphysema.   12/10/19 MRA head - MRA of the  brain showing bilobed 5 x 6 mm left MCA bifurcation aneurysm.  No significant narrowing of the large and medium size intracranial vessels.      ASSESSMENT AND PLAN  65 y.o. year old female here with:  Dx:  1. Cough headache   2. 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 Nonruptured cerebral aneurysm      PLAN:  COUGH EXERTION HEADACHE (since Sept 2021) - weekly exacerbation; follow up with pulmonology  MIGRAINE WITH AURA (lifelong) - stable; avoid triptans  Left eye branch retinal artery occlusion (06/23/19): - continue aspirin 81mg , atorvastatin  80mg , BP control - stop triptans (not recommend due to stroke)  LEFT MCA ANEURYSM (59mm; asymptomatic unruptured) - stable; repeat MRA in 2 years Dec 2023  Return for return to PCP, pending if symptoms worsen or fail to improve.    Penni Bombard, MD 2/53/6644, 0:34 PM Certified in Neurology, Neurophysiology and Neuroimaging  [facility] [address] [place], [place] [address] [phone]

## 2020-06-22 NOTE — Patient Instructions (Signed)
  COUGH EXERTION HEADACHE (since Sept 2021) - weekly exacerbation; follow up with pulmonology  MIGRAINE WITH AURA (lifelong) - stable; avoid triptans  Left eye branch retinal artery occlusion (06/23/19): - continue aspirin 81mg , atorvastatin 80mg , BP control - stop triptans (not recommend due to stroke)  LEFT MCA ANEURYSM (16mm; asymptomatic unruptured) - stable; repeat MRA in 2 years Dec 2023

## 2020-07-08 ENCOUNTER — Encounter: Payer: Self-pay | Admitting: *Deleted

## 2020-07-20 ENCOUNTER — Other Ambulatory Visit: Payer: Self-pay | Admitting: Family Medicine

## 2020-07-20 ENCOUNTER — Telehealth: Payer: Self-pay | Admitting: Family Medicine

## 2020-07-20 ENCOUNTER — Other Ambulatory Visit: Payer: Self-pay

## 2020-07-20 DIAGNOSIS — E785 Hyperlipidemia, unspecified: Secondary | ICD-10-CM

## 2020-07-20 MED ORDER — ATORVASTATIN CALCIUM 40 MG PO TABS: 40.0000 mg | ORAL_TABLET | Freq: Every day | ORAL | 0 refills | Status: DC

## 2020-07-20 NOTE — Telephone Encounter (Signed)
Patient Rx has been filled and sent to patient pharmacy.

## 2020-07-20 NOTE — Telephone Encounter (Signed)
Pt insurance company called in asking for a refill on the Atorvastatin 40 mgs pt uses [facility]

## 2020-08-06 ENCOUNTER — Telehealth: Payer: Self-pay | Admitting: *Deleted

## 2020-08-06 MED ORDER — PREDNISONE 10 MG PO TABS: ORAL_TABLET | ORAL | 0 refills | Status: DC

## 2020-08-06 NOTE — Telephone Encounter (Signed)
Mychart message:  Dr Annamaria Boots,     I hope you are well.  Can you please call in a prescription for prednisone for me?  I cannot even hardly go outside in this summer heat and humidity and last year you called in prednisone and it did help.     Thank you, Carol Quinn  Primary Pulmonologist: Young Last office visit and with whom: 04/29/20  What do we see them for (pulmonary problems):  Last OV assessment/plan:    Assessment & Plan Note by Deneise Lever, MD at 04/29/2020 11:21 AM  Author: Deneise Lever, MD Author Type: Physician Filed: 04/29/2020 11:22 AM  Note Status: Written Cosign: Cosign Not Required Encounter Date: 04/29/2020  Problem: Overweight (BMI 25.0-29.9)  Editor: Deneise Lever, MD (Physician)               She would like to lose weight but exercise capacity is limited. She was interested in exploring [facility].        Assessment & Plan Note by Deneise Lever, MD at 04/29/2020 11:16 AM  Author: Deneise Lever, MD Author Type: Physician Filed: 04/29/2020 11:18 AM  Note Status: Bernell List: Cosign Not Required Encounter Date: 04/29/2020  Problem: COPD exacerbation  Editor: Deneise Lever, MD (Physician)      Prior Versions: 1. Deneise Lever, MD (Physician) at 04/29/2020 11:17 AM - Written    Mild nonspecific exacerbation. Gradual progression is expected. Recent sputum change may indicate infection. Plan- Zpak. Try samples of Breztri for comparison with Trelegy        Assessment & Plan Note by Deneise Lever, MD at 04/29/2020 11:10 AM  Author: Deneise Lever, MD Author Type: Physician Filed: 04/29/2020 11:16 AM  Note Status: Written Cosign: Cosign Not Required Encounter Date: 04/29/2020  Problem: Chronic respiratory failure with hypoxia [facility])  Editor: Deneise Lever, MD (Physician)               Desaturated to 86% on short walk on room air, with quick recovery. Has O2 including portable. Plan- educated on continued need and use.            Patient Instructions by Deneise Lever, MD at 04/29/2020 9:30 AM  Author: Deneise Lever, MD Author Type: Physician Filed: 04/29/2020 10:20 AM  Note Status: Addendum Cosign: Cosign Not Required Encounter Date: 04/29/2020  Editor: Deneise Lever, MD (Physician)      Prior Versions: 1. Deneise Lever, MD (Physician) at 04/29/2020 10:19 AM - Addendum   2. Deneise Lever, MD (Physician) at 04/29/2020 10:13 AM - Signed    Order- sample x 2 Breztri inhaler    Inhale 2 puffs then rinse mouth, twice daily.   Try this instead of  Trelegy for comparison. If no better, then just go back to Trelegy. You can also ask your drug store which would be cheaper for you.   We can continue routine meds.   Suggest for nasal congestion/ stuffy ears- try Sudafed (6-12 hour form, not 24 hour). You have to ask and sign for this at Pharmacy counter.   Prednisone taper sent   Order- referral to [facility]        Orthostatic Vitals Recorded in This Encounter   04/29/2020  0947     Patient Position: Sitting  BP Location: Left Arm  Cuff Size: Normal   Instructions    Return in about 6 months (around 10/29/2020).  Order- sample x  2 Breztri inhaler    Inhale 2 puffs then rinse mouth, twice daily.   Try this instead of  Trelegy for comparison. If no better, then just go back to Trelegy. You can also ask your drug store which would be cheaper for you.   We can continue routine meds.   Suggest for nasal congestion/ stuffy ears- try Sudafed (6-12 hour form, not 24 hour). You have to ask and sign for this at Pharmacy counter.   Prednisone taper sent   Order- referral to [facility]       Reason for call: Sob for the past month.  She has cough and clear to white mucous. She says this happens when it turns hot and humid.  She is using her trelegy, allegra, Flonase and Singulair as prescribed.  She is requesting prednisone.   Dr. Annamaria Boots, Please advise.    Thank you.  (examples of things to ask: : When did symptoms start? Fever? Cough? Productive? Color to sputum? More sputum than usual? Wheezing? Have you needed increased oxygen? Are you taking your respiratory medications? What over the counter measures have you tried?)  Allergies  Allergen Reactions   Clarithromycin Diarrhea, Rash and Other (See Comments)    Severe diarrhea  Rash Other reaction(s): DIARRHEA   Hydrocodone Bit-Homatrop Mbr Other (See Comments)    paranoia   Hydrocodone Bit-Homatrop Mbr Other (See Comments)    paranoia   Penicillins Other (See Comments)    Unknown reaction, childhood allergy Has patient had a PCN reaction causing immediate rash, facial/tongue/throat swelling, SOB or lightheadedness with hypotension: Unknown Has patient had a PCN reaction causing severe rash involving mucus membranes or skin necrosis: Unknown Has patient had a PCN reaction that required hospitalization: Unknown Has patient had a PCN reaction occurring within the last 10 years: No If all of the above answers are "NO", then may proceed with Cephalosporin use.  unknown Unknown reaction, childhood allergy   Topiramate Other (See Comments)    WEIGHT LOSS Other reaction(s): WEIGHT LOSS WEIGHT LOSS   Codeine Itching    Immunization History  Administered Date(s) Administered   Influenza Split 10/21/2011, 10/25/2012, 12/24/2014, 10/15/2015   Influenza Whole 09/10/2008, 10/10/2009, 09/11/2010   Influenza,inj,Quad PF,6+ Mos 09/10/2013, 10/28/2016, 10/03/2017, 10/01/2018, 10/30/2019   Moderna Sars-Covid-2 Vaccination 03/28/2019, 04/24/2019, 11/20/2019   Pneumococcal Conjugate-13 01/31/2017   Pneumococcal Polysaccharide-23 09/11/2007, 10/30/2017   Tdap 12/21/2012   Zoster Recombinat (Shingrix) 10/01/2018   Zoster, Live 02/05/2019

## 2020-08-06 NOTE — Telephone Encounter (Signed)
I called and spoke with the pt and notified of response per Dr Annamaria Boots  Rx was sent to [facility]

## 2020-08-06 NOTE — Telephone Encounter (Signed)
Please send prednisone 10 mg, # 15   2 x 5 days then 1 x 5 days If this doesn't help enough let me know.

## 2020-08-12 ENCOUNTER — Inpatient Hospital Stay: Payer: Medicare Other | Attending: Hematology & Oncology

## 2020-08-12 ENCOUNTER — Encounter: Payer: Self-pay | Admitting: Hematology & Oncology

## 2020-08-12 ENCOUNTER — Ambulatory Visit (HOSPITAL_BASED_OUTPATIENT_CLINIC_OR_DEPARTMENT_OTHER)
Admission: RE | Admit: 2020-08-12 | Discharge: 2020-08-12 | Disposition: A | Discharge status: Home / Self Care | Payer: Medicare Other | Source: Ambulatory Visit | Attending: Hematology & Oncology | Admitting: Hematology & Oncology

## 2020-08-12 ENCOUNTER — Other Ambulatory Visit: Payer: Self-pay

## 2020-08-12 ENCOUNTER — Inpatient Hospital Stay (HOSPITAL_BASED_OUTPATIENT_CLINIC_OR_DEPARTMENT_OTHER): Payer: Medicare Other | Admitting: Hematology & Oncology

## 2020-08-12 ENCOUNTER — Telehealth: Payer: Self-pay | Admitting: Internal Medicine

## 2020-08-12 VITALS — BP 134/60 | HR 102 | Temp 98.4°F | Resp 24 | Wt 149.4 lb

## 2020-08-12 DIAGNOSIS — Z85840 Personal history of malignant neoplasm of eye: Secondary | ICD-10-CM | POA: Diagnosis not present

## 2020-08-12 DIAGNOSIS — J42 Unspecified chronic bronchitis: Secondary | ICD-10-CM | POA: Insufficient documentation

## 2020-08-12 DIAGNOSIS — Z79899 Other long term (current) drug therapy: Secondary | ICD-10-CM | POA: Diagnosis not present

## 2020-08-12 DIAGNOSIS — C6931 Malignant neoplasm of right choroid: Secondary | ICD-10-CM

## 2020-08-12 DIAGNOSIS — J45909 Unspecified asthma, uncomplicated: Secondary | ICD-10-CM | POA: Insufficient documentation

## 2020-08-12 LAB — CBC WITH DIFFERENTIAL (CANCER CENTER ONLY)
Abs Immature Granulocytes: 0.02 10*3/uL (ref 0.00–0.07)
Basophils Absolute: 0.1 10*3/uL (ref 0.0–0.1)
Basophils Relative: 2 %
Eosinophils Absolute: 0.4 10*3/uL (ref 0.0–0.5)
Eosinophils Relative: 5 %
HCT: 43.1 % (ref 36.0–46.0)
Hemoglobin: 14.4 g/dL (ref 12.0–15.0)
Immature Granulocytes: 0 %
Lymphocytes Relative: 21 %
Lymphs Abs: 1.6 10*3/uL (ref 0.7–4.0)
MCH: 32.7 pg (ref 26.0–34.0)
MCHC: 33.4 g/dL (ref 30.0–36.0)
MCV: 98 fL (ref 80.0–100.0)
Monocytes Absolute: 0.8 10*3/uL (ref 0.1–1.0)
Monocytes Relative: 10 %
Neutro Abs: 4.9 10*3/uL (ref 1.7–7.7)
Neutrophils Relative %: 62 %
Platelet Count: 325 10*3/uL (ref 150–400)
RBC: 4.4 MIL/uL (ref 3.87–5.11)
RDW: 12 % (ref 11.5–15.5)
WBC Count: 7.9 10*3/uL (ref 4.0–10.5)
nRBC: 0 % (ref 0.0–0.2)

## 2020-08-12 LAB — CMP (CANCER CENTER ONLY)
ALT: 25 U/L (ref 0–44)
AST: 17 U/L (ref 15–41)
Albumin: 3.9 g/dL (ref 3.5–5.0)
Alkaline Phosphatase: 74 U/L (ref 38–126)
Anion gap: 9 (ref 5–15)
BUN: 18 mg/dL (ref 8–23)
CO2: 36 mmol/L — ABNORMAL HIGH (ref 22–32)
Calcium: 9.9 mg/dL (ref 8.9–10.3)
Chloride: 100 mmol/L (ref 98–111)
Creatinine: 0.68 mg/dL (ref 0.44–1.00)
GFR, Estimated: >60 mL/min (ref >60)
Glucose, Bld: 125 mg/dL — ABNORMAL HIGH (ref 70–99)
Potassium: 3.6 mmol/L (ref 3.5–5.1)
Sodium: 145 mmol/L (ref 135–145)
Total Bilirubin: 0.4 mg/dL (ref 0.3–1.2)
Total Protein: 6.6 g/dL (ref 6.5–8.1)

## 2020-08-12 LAB — LACTATE DEHYDROGENASE: LDH: 124 U/L (ref 98–192)

## 2020-08-12 MED ORDER — IOHEXOL 300 MG/ML  SOLN
75.0000 mL | Freq: Once | INTRAMUSCULAR | Status: AC | PRN
  Administered 2020-08-12: 75 mL via INTRAVENOUS

## 2020-08-12 NOTE — Progress Notes (Signed)
Hematology and Oncology Follow Up Visit  Carol Quinn [id number] 1955-09-29 65 y.o. 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  Unfortunately, the hot humid weather really bothers her lungs.  She has underlying asthma.  She really cannot go out because of the underlying asthma  She is doing well with her ocular prosthetic.  She had an ocular prosthetic for the right eye.  She says she gets this cleaned every 6 months.  She did have a CT scan done today.  The CT scan looked okay except for the fact that there is a mention of a masslike thickening in the second portion of the duodenum.  She is totally asymptomatic.  She is having no problems with bleeding.  There is no nausea or vomiting.  There is no abdominal pain.  She has no obvious change in bowel or bladder habits.  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  I will speak to her gastroenterologist-Dr. Hilarie Quinn to see if you might be able to do an upper endoscopy to reach this area.  She has had no problems with rashes.  There is been no headache.  She has had no bleeding.  She has had no cough or shortness of breath.  Again, she has to stay inside because of the underlying COPD.  Currently, her performance status is ECOG 1.     Medications:  Allergies as of 08/12/2020       Reactions   Clarithromycin Diarrhea, Rash, Other (See Comments)   Severe diarrhea  Rash Other reaction(s): DIARRHEA   Hydrocodone Bit-homatrop Mbr Other (See Comments)   paranoia   Hydrocodone Bit-homatrop Mbr Other (See Comments)   paranoia   Penicillins Other (See Comments)   Unknown reaction, childhood allergy Has patient had a PCN reaction causing  immediate rash, facial/tongue/throat swelling, SOB or lightheadedness with hypotension: Unknown Has patient had a PCN reaction causing severe rash involving mucus membranes or skin necrosis: Unknown Has patient had a PCN reaction that required hospitalization: Unknown Has patient had a PCN reaction occurring within the last 10 years: No If all of the above answers are "NO", then may proceed with Cephalosporin use. unknown Unknown reaction, childhood allergy   Topiramate Other (See Comments)   WEIGHT LOSS Other reaction(s): WEIGHT LOSS WEIGHT LOSS   Codeine Itching        Medication List        Accurate as of August 12, 2020 11:13 AM. If you have any questions, ask your nurse or doctor.          STOP taking these medications    Breztri Aerosphere 160-9-4.8 MCG/ACT Aero Generic drug: Budeson-Glycopyrrol-Formoterol Stopped by: [name], MD       TAKE these medications    albuterol (2.5 MG/3ML) 0.083% nebulizer solution Commonly known as: PROVENTIL Take 3 mLs (2.5 mg total) by nebulization every 6 (six) hours as needed for wheezing or shortness of breath.   ALPRAZolam 0.5 MG tablet Commonly known as: XANAX TAKE 1 TABLET BY MOUTH 2 TIMES DAILY AS NEEDED FOR ANXIETY   atorvastatin 40 MG tablet Commonly known as: LIPITOR Take 1 tablet (40 mg total) by mouth daily.   buPROPion 150 MG 24 hr tablet  Commonly known as: WELLBUTRIN XL TAKE 1 TABLET BY MOUTH EVERY MORNING   escitalopram 20 MG tablet Commonly known as: LEXAPRO TAKE 1 TABLET BY MOUTH DAILY   estradiol 0.0375 MG/24HR Commonly known as: VIVELLE-DOT Place onto the skin 2 (two) times a week.   fexofenadine 180 MG tablet Commonly known as: ALLEGRA Take 180 mg by mouth daily.   Fish Oil 1200 MG Caps Take by mouth daily.   fluticasone 50 MCG/ACT nasal spray Commonly known as: FLONASE Place into the nose.   Flutter Devi Use as directed   LYSINE PO Take by mouth daily.   modafinil 200 MG  tablet Commonly known as: PROVIGIL TAKE 1 TABLET BY MOUTH DAILY   montelukast 10 MG tablet Commonly known as: SINGULAIR TAKE 1 TABLET BY MOUTH ONCE DAILY AT BEDTIME   naphazoline-pheniramine 0.025-0.3 % ophthalmic solution Commonly known as: NAPHCON-A Place into both eyes.   omeprazole 40 MG capsule Commonly known as: PRILOSEC TAKE 1 CAPSULE BY MOUTH DAILY   predniSONE 10 MG tablet Commonly known as: DELTASONE 2 x 5 days, 1 x 5 days, then stop What changed: Another medication with the same name was removed. Continue taking this medication, and follow the directions you see here. Changed by: [name], MD   PreviDent 5000 Booster Plus 1.1 % Pste Generic drug: Sodium Fluoride   Trelegy Ellipta 100-62.5-25 MCG/INH Aepb Generic drug: Fluticasone-Umeclidin-Vilant INHALE 1 PUFF INTO THE LUNGS DAILY   valsartan-hydrochlorothiazide 160-25 MG tablet Commonly known as: DIOVAN-HCT TAKE 1 TABLET BY MOUTH DAILY        Allergies:  Allergies  Allergen Reactions   Clarithromycin Diarrhea, Rash and Other (See Comments)    Severe diarrhea  Rash Other reaction(s): DIARRHEA   Hydrocodone Bit-Homatrop Mbr Other (See Comments)    paranoia   Hydrocodone Bit-Homatrop Mbr Other (See Comments)    paranoia   Penicillins Other (See Comments)    Unknown reaction, childhood allergy Has patient had a PCN reaction causing immediate rash, facial/tongue/throat swelling, SOB or lightheadedness with hypotension: Unknown Has patient had a PCN reaction causing severe rash involving mucus membranes or skin necrosis: Unknown Has patient had a PCN reaction that required hospitalization: Unknown Has patient had a PCN reaction occurring within the last 10 years: No If all of the above answers are "NO", then may proceed with Cephalosporin use.  unknown Unknown reaction, childhood allergy   Topiramate Other (See Comments)    WEIGHT LOSS Other reaction(s): WEIGHT LOSS WEIGHT LOSS   Codeine  Itching    Past Medical History, Surgical history, Social history, and Family History were reviewed and updated.  Review of Systems: Review of Systems  Constitutional: Negative.   HENT: Negative.    Eyes: Negative.   Respiratory: Negative.    Cardiovascular: Negative.   Gastrointestinal:  Positive for abdominal pain.  Genitourinary: Negative.   Musculoskeletal: Negative.   Skin: Negative.   Neurological: Negative.   Endo/Heme/Allergies: Negative.   Psychiatric/Behavioral: Negative.       Physical Exam:  weight is 149 lb 6.4 oz (67.8 kg). Her oral temperature is 98.4 F (36.9 C). Her blood pressure is 134/60 and her pulse is 102 (abnormal). Her respiration is 24 (abnormal) and oxygen saturation is 91%.   Wt Readings from Last 3 Encounters:  08/12/20 149 lb 6.4 oz (67.8 kg)  06/22/20 155 lb (70.3 kg)  05/20/20 154 lb 12.8 oz (70.2 kg)    Physical Exam Vitals reviewed.  HENT:     Head: Normocephalic and atraumatic.  Eyes:     Pupils: Pupils are equal, round, and reactive to light.     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  Left eye is unremarkable.  She has good extraocular muscle movement of the left eye.  Cardiovascular:     Rate and Rhythm: Normal rate and regular rhythm.     Heart sounds: Normal heart sounds.  Pulmonary:     Effort: Pulmonary effort is normal.     Breath sounds: Normal breath sounds.  Abdominal:     General: Bowel sounds are normal.     Palpations: Abdomen is soft.  Musculoskeletal:        General: No tenderness or deformity. Normal range of motion.     Cervical back: Normal range of motion.  Lymphadenopathy:     Cervical: No cervical adenopathy.  Skin:    General: Skin is warm and dry.     Findings: No erythema or rash.  Neurological:     Mental Status: She is alert and oriented to person, place, and time.  Psychiatric:        Behavior: Behavior normal.        Thought Content: Thought content normal.        Judgment:  Judgment normal.     Lab Results  Component Value Date   WBC 7.9 08/12/2020   HGB 14.4 08/12/2020   HCT 43.1 08/12/2020   MCV 98.0 08/12/2020   PLT 325 08/12/2020   No results found for: FERRITIN, IRON, TIBC, UIBC, IRONPCTSAT Lab Results  Component Value Date   RBC 4.40 08/12/2020   No results found for: KPAFRELGTCHN, LAMBDASER, KAPLAMBRATIO No results found for: IGGSERUM, IGA, IGMSERUM No results found for: Odetta Pink, SPEI   Chemistry      Component Value Date/Time   NA 145 08/12/2020 0830   NA 143 12/23/2016 0848   K 3.6 08/12/2020 0830   K 4.3 12/23/2016 0848   CL 100 08/12/2020 0830   CL 99 12/23/2016 0848   CO2 36 (H) 08/12/2020 0830   CO2 31 12/23/2016 0848   BUN 18 08/12/2020 0830   BUN 10 12/23/2016 0848   CREATININE 0.68 08/12/2020 0830   CREATININE 1.0 12/23/2016 0848      Component Value Date/Time   CALCIUM 9.9 08/12/2020 0830   CALCIUM 9.4 12/23/2016 0848   ALKPHOS 74 08/12/2020 0830   ALKPHOS 99 (H) 12/23/2016 0848   AST 17 08/12/2020 0830   ALT 25 08/12/2020 0830   ALT 26 12/23/2016 0848   BILITOT 0.4 08/12/2020 0830      Impression and Plan: Ms. Conti is a very pleasant 65 yo caucasian female with a choroidal melanoma of the right eye with right eye enucleation in October 2017.   I know it has been almost 5 years since she had the ocular melanoma removed.  Again we will get have to see if Gastroenterology will be able to do an upper endoscopy on her.  I think this can be important.  If they cannot reach the area, then we might have to get a PET scan to see what could be going on.  Hopefully, everything looks fine, we will see about getting back in another 6 months.  That would be the last time we would have to do a routine CT scan on her.  [name], MD 8/3/202211:13 AM

## 2020-08-12 NOTE — Telephone Encounter (Signed)
[name] called today to discuss Carol Quinn She is known to me from her last colonoscopy  She has a history of ocular melanoma Surveillance abdominal imaging showed a masslike thickening in the second portion of the duodenum He is requesting endoscopic evaluation  She is certainly appropriate for upper endoscopy and this can be scheduled directly. If she is agreeable I have an appointment available in the [facility] for EGD on 08/19/2020  Diagnosis: Abnormal CT scan abdomen; history of ocular melanoma  Please contact the patient and schedule EGD  Thank you

## 2020-08-13 ENCOUNTER — Telehealth: Payer: Self-pay | Admitting: Hematology & Oncology

## 2020-08-13 ENCOUNTER — Other Ambulatory Visit: Payer: Self-pay

## 2020-08-13 DIAGNOSIS — R935 Abnormal findings on diagnostic imaging of other abdominal regions, including retroperitoneum: Secondary | ICD-10-CM

## 2020-08-13 NOTE — Telephone Encounter (Signed)
Scheduled EGD in the [facility] on 08/19/20 with Dr. Hilarie Fredrickson. Instructions given verbally and by My Chart.

## 2020-08-13 NOTE — Telephone Encounter (Signed)
The density in the left chest on CT looks like an area of recent pneumonia, such as Covid. It wouldn't take a lot to make you short of breath, especially in this heat, but it looks pretty small. Hopefully if is getting gradually better and will go away. The radiologist recommended a repeat CT chest in 2 months. I can absolutely see you soon if needed, or wait for you to let me know I you need my help.

## 2020-08-13 NOTE — Telephone Encounter (Signed)
No los 8/3 

## 2020-08-13 NOTE — Telephone Encounter (Signed)
Dr. Annamaria Boots, This message was received this afternoon for you.     Dr Annamaria Boots,   I hope you are well.     I had my cancer scans yesterday and there were some new lung findings.  Since I am still having difficulty breathing I wanted to check with you to see if the new findings could be the cause of my current breathing difficulties.  The scan results are in my test results with [facility].  Below is what was identified by the radiologist.             Lungs/Pleura: In the posterior aspect of the left upper lobe there is a new area of what appears to be resolving airspace consolidation which is associated with significant regional architectural distortion which is somewhat nodular and mass-like in appearance, best appreciated on axial images 70-74 of series 4. No other definite suspicious appearing pulmonary nodules or masses are noted. No pleural effusions. Diffuse bronchial wall thickening with severe centrilobular and paraseptal emphysema   Best Regards, Carol Quinn   Allergies  Allergen Reactions   Clarithromycin Diarrhea, Rash and Other (See Comments)    Severe diarrhea  Rash Other reaction(s): DIARRHEA   Hydrocodone Bit-Homatrop Mbr Other (See Comments)    paranoia   Hydrocodone Bit-Homatrop Mbr Other (See Comments)    paranoia   Penicillins Other (See Comments)    Unknown reaction, childhood allergy Has patient had a PCN reaction causing immediate rash, facial/tongue/throat swelling, SOB or lightheadedness with hypotension: Unknown Has patient had a PCN reaction causing severe rash involving mucus membranes or skin necrosis: Unknown Has patient had a PCN reaction that required hospitalization: Unknown Has patient had a PCN reaction occurring within the last 10 years: No If all of the above answers are "NO", then may proceed with Cephalosporin use.  unknown Unknown reaction, childhood allergy   Topiramate Other (See Comments)    WEIGHT LOSS Other reaction(s): WEIGHT  LOSS WEIGHT LOSS   Codeine Itching   Current Outpatient Medications on File Prior to Visit  Medication Sig Dispense Refill   albuterol (PROVENTIL) (2.5 MG/3ML) 0.083% nebulizer solution Take 3 mLs (2.5 mg total) by nebulization every 6 (six) hours as needed for wheezing or shortness of breath. 150 mL 11   ALPRAZolam (XANAX) 0.5 MG tablet TAKE 1 TABLET BY MOUTH 2 TIMES DAILY AS NEEDED FOR ANXIETY (Patient not taking: Reported on 08/12/2020) 30 tablet 1   atorvastatin (LIPITOR) 40 MG tablet Take 1 tablet (40 mg total) by mouth daily. 90 tablet 0   buPROPion (WELLBUTRIN XL) 150 MG 24 hr tablet TAKE 1 TABLET BY MOUTH EVERY MORNING 90 tablet 1   escitalopram (LEXAPRO) 20 MG tablet TAKE 1 TABLET BY MOUTH DAILY 30 tablet 6   estradiol (VIVELLE-DOT) 0.0375 MG/24HR Place onto the skin 2 (two) times a week.     fexofenadine (ALLEGRA) 180 MG tablet Take 180 mg by mouth daily.     fluticasone (FLONASE) 50 MCG/ACT nasal spray Place into the nose.     LYSINE PO Take by mouth daily.     modafinil (PROVIGIL) 200 MG tablet TAKE 1 TABLET BY MOUTH DAILY 30 tablet 5   montelukast (SINGULAIR) 10 MG tablet TAKE 1 TABLET BY MOUTH ONCE DAILY AT BEDTIME 30 tablet 3   naphazoline-pheniramine (NAPHCON-A) 0.025-0.3 % ophthalmic solution Place into both eyes.     Omega-3 Fatty Acids (FISH OIL) 1200 MG CAPS Take by mouth daily.     omeprazole (PRILOSEC) 40 MG capsule TAKE 1 CAPSULE  BY MOUTH DAILY 30 capsule 6   predniSONE (DELTASONE) 10 MG tablet 2 x 5 days, 1 x 5 days, then stop 15 tablet 0   PREVIDENT 5000 BOOSTER PLUS 1.1 % PSTE      Respiratory Therapy Supplies (FLUTTER) DEVI Use as directed 1 each 0   TRELEGY ELLIPTA 100-62.5-25 MCG/INH AEPB INHALE 1 PUFF INTO THE LUNGS DAILY 60 each 11   valsartan-hydrochlorothiazide (DIOVAN-HCT) 160-25 MG tablet TAKE 1 TABLET BY MOUTH DAILY 90 tablet 1   No current facility-administered medications on file prior to visit.

## 2020-08-13 NOTE — Telephone Encounter (Signed)
Please advise on patient mychart message     I did have a CoVid test yesterday and it was negative.  I also had a CoVid booster last Thursday.  Of course that does not mean that I have not had CoVid.  I have been sick and coughing up mucus.  The mucus has been clear, thick, and white.  I am using my nebulizer 2 to 4 times a day.  I am also finishing my prednisone and if I am not better by next week I will call in for an appointment.   Regards, Carol Quinn

## 2020-08-19 ENCOUNTER — Other Ambulatory Visit: Payer: Self-pay

## 2020-08-19 ENCOUNTER — Ambulatory Visit (AMBULATORY_SURGERY_CENTER): Payer: Medicare Other | Admitting: Internal Medicine

## 2020-08-19 ENCOUNTER — Encounter: Payer: Self-pay | Admitting: Internal Medicine

## 2020-08-19 VITALS — BP 113/62 | HR 81 | Temp 97.5°F | Resp 20 | Ht 62.0 in | Wt 149.0 lb

## 2020-08-19 DIAGNOSIS — K449 Diaphragmatic hernia without obstruction or gangrene: Secondary | ICD-10-CM | POA: Diagnosis not present

## 2020-08-19 DIAGNOSIS — R935 Abnormal findings on diagnostic imaging of other abdominal regions, including retroperitoneum: Secondary | ICD-10-CM

## 2020-08-19 MED ORDER — SODIUM CHLORIDE 0.9 % IV SOLN: 500.0000 mL | Freq: Once | INTRAVENOUS | Status: DC

## 2020-08-19 NOTE — Progress Notes (Signed)
During the admitting process patient told us that she used 2L of O2 at night. I went to the CRNA and he acknowledged this and said it was fine to proceed. [facility] policy states continuous oxygen use or severe COPD. MD was made aware of oxygen use and procedure  was performed without difficulty.

## 2020-08-19 NOTE — Op Note (Signed)
[facility] Patient Name: Carol Quinn Procedure Date: 08/19/2020 9:07 AM MRN: [id number] Endoscopist: Jerene Bears , MD Age: 65 Referring MD:  Date of Birth: April 18, 1955 Gender: Female Account #: [id number] Procedure:                Upper GI endoscopy Indications:              Abnormal CT of the GI tract in setting of personal                            history of ocular melanoma (duodenal thickening) Medicines:                Monitored Anesthesia Care Procedure:                Pre-Anesthesia Assessment:                           - Prior to the procedure, a History and Physical                            was performed, and patient medications and                            allergies were reviewed. The patient's tolerance of                            previous anesthesia was also reviewed. The risks                            and benefits of the procedure and the sedation                            options and risks were discussed with the patient.                            All questions were answered, and informed consent                            was obtained. Prior Anticoagulants: The patient has                            taken no previous anticoagulant or antiplatelet                            agents. ASA Grade Assessment: III - A patient with                            severe systemic disease. After reviewing the risks                            and benefits, the patient was deemed in                            satisfactory condition to undergo the procedure.  After obtaining informed consent, the endoscope was                            passed under direct vision. Throughout the                            procedure, the patient's blood pressure, pulse, and                            oxygen saturations were monitored continuously. The                            0405 PCF-H190TL Slim SB Colonoscope was introduced                             through the mouth, and advanced to the third part                            of duodenum. The upper GI endoscopy was                            accomplished without difficulty. The patient                            tolerated the procedure well. Scope In: Scope Out: Findings:                 Normal mucosa was found in the entire esophagus.                           A 2 cm hiatal hernia was present.                           The entire examined stomach was normal.                           The examined duodenum was normal. No duodenal mass                            or infiltrative process seen. Complications:            No immediate complications. Estimated Blood Loss:     Estimated blood loss: none. Impression:               - Normal mucosa was found in the entire esophagus.                           - 2 cm hiatal hernia.                           - Normal stomach.                           - Normal examined duodenum.                           -  No specimens collected. Recommendation:           - Patient has a contact number available for                            emergencies. The signs and symptoms of potential                            delayed complications were discussed with the                            patient. Return to normal activities tomorrow.                            Written discharge instructions were provided to the                            patient.                           - Resume previous diet.                           - Continue present medications. Jerene Bears, MD 08/19/2020 9:36:40 AM This report has been signed electronically.

## 2020-08-19 NOTE — Patient Instructions (Signed)
YOU HAD AN ENDOSCOPIC PROCEDURE TODAY AT [facility]:   Refer to the procedure report that was given to you for any specific questions about what was found during the examination.  If the procedure report does not answer your questions, please call your gastroenterologist to clarify.  If you requested that your care partner not be given the details of your procedure findings, then the procedure report has been included in a sealed envelope for you to review at your convenience later.  YOU SHOULD EXPECT: Some feelings of bloating in the abdomen. Passage of more gas than usual.  Walking can help get rid of the air that was put into your GI tract during the procedure and reduce the bloating. If you had a lower endoscopy (such as a colonoscopy or flexible sigmoidoscopy) you may notice spotting of blood in your stool or on the toilet paper. If you underwent a bowel prep for your procedure, you may not have a normal bowel movement for a few days.  Please Note:  You might notice some irritation and congestion in your nose or some drainage.  This is from the oxygen used during your procedure.  There is no need for concern and it should clear up in a day or so.  SYMPTOMS TO REPORT IMMEDIATELY:   Following upper endoscopy (EGD)  Vomiting of blood or coffee ground material  New chest pain or pain under the shoulder blades  Painful or persistently difficult swallowing  New shortness of breath  Fever of 100F or higher  Black, tarry-looking stools  For urgent or emergent issues, a gastroenterologist can be reached at any hour by calling [phone]. Do not use MyChart messaging for urgent concerns.    DIET:  We do recommend a small meal at first, but then you may proceed to your regular diet.  Drink plenty of fluids but you should avoid alcoholic beverages for 24 hours.  MEDICATIONS: Continue present medications.  Please see handouts given to you by your recovery nurse.  Thank you  for allowing [place] to provide for your healthcare needs today.  ACTIVITY:  You should plan to take it easy for the rest of today and you should NOT DRIVE or use heavy machinery until tomorrow (because of the sedation medicines used during the test).    FOLLOW UP: Our staff will call the number listed on your records 48-72 hours following your procedure to check on you and address any questions or concerns that you may have regarding the information given to you following your procedure. If we do not reach you, we will leave a message.  We will attempt to reach you two times.  During this call, we will ask if you have developed any symptoms of COVID 19. If you develop any symptoms (ie: fever, flu-like symptoms, shortness of breath, cough etc.) before then, please call [phone].  If you test positive for Covid 19 in the 2 weeks post procedure, please call and report this information to [place].    If any biopsies were taken you will be contacted by phone or by letter within the next 1-3 weeks.  Please call us at [phone] if you have not heard about the biopsies in 3 weeks.    SIGNATURES/CONFIDENTIALITY: You and/or your care partner have signed paperwork which will be entered into your electronic medical record.  These signatures attest to the fact that that the information above on your After Visit Summary has been reviewed and is understood.  Full responsibility of the confidentiality of this discharge information lies with you and/or your care-partner.  

## 2020-08-19 NOTE — Progress Notes (Signed)
[facility] History and Physical   Primary Care Physician:  Midge Minium, MD   Reason for Procedure:  Abnormal CT scan  Plan:    Upper endoscopy done in the [place] to evaluate abnormal CT scan and possible duodenal thickening in the setting of ocular melanoma.     HPI: Carol Quinn is a 65 y.o. female with history of ocular melanoma, COPD, hypertension, hyperlipidemia, GERD who presents for upper endoscopy to evaluate duodenal thickening suggested by recent CT scan.  I was contacted by Dr. Marin Olp last week and asked to expedite upper endoscopy.  She is not having abdominal pain, nausea or vomiting.  No dysphagia.  She notices mild early satiety and then is hungry again in 1 or 2 hours.  Normal bowel movements.  No melena.  No recent chest pain, change in respiratory status.  No new dyspnea or DOE.   Past Medical History:  Diagnosis Date   Acute bronchitis    Allergic rhinitis    Allergy    Anxiety    Aortic atherosclerosis (HCC)    Cancer (HCC)    Chronic headaches    COPD (chronic obstructive pulmonary disease) (HCC)    emphysema   Depression    Emphysema of lung (HCC)    Emphysema, unspecified (HCC)    Exposure to TB    uncle-her PPD neg   GERD (gastroesophageal reflux disease)    Hepatic cyst    left   Hyperlipidemia    Hypertension    MDS (myelodysplastic syndrome), low grade ([facility]) 09/23/2016   Ocular melanoma, right ([place])    has prosthesis   Oxygen deficiency    RBBB (right bundle branch block with left anterior fascicular block)     Past Surgical History:  Procedure Laterality Date   BREAST BIOPSY Left 2002   COLONOSCOPY WITH PROPOFOL N/A 03/21/2017   Procedure: COLONOSCOPY WITH PROPOFOL;  Surgeon: Jerene Bears, MD;  Location: [facility];  Service: Gastroenterology;  Laterality: N/A;   NOSE SURGERY     "nasal deviation"   TOTAL ABDOMINAL HYSTERECTOMY     TRIGGER FINGER RELEASE Right 06/05/2018   Procedure: RIGHT THUMB RELEASE TRIGGER  FINGER/A-1 PULLEY;  Surgeon: Daryll Brod, MD;  Location: [facility];  Service: Orthopedics;  Laterality: Right;   TUBAL LIGATION      Prior to Admission medications   Medication Sig Start Date End Date Taking? Authorizing Provider  albuterol (PROVENTIL) (2.5 MG/3ML) 0.083% nebulizer solution Take 3 mLs (2.5 mg total) by nebulization every 6 (six) hours as needed for wheezing or shortness of breath. 04/30/19  Yes Young, Tarri Fuller D, MD  atorvastatin (LIPITOR) 40 MG tablet Take 1 tablet (40 mg total) by mouth daily. 07/20/20  Yes Midge Minium, MD  buPROPion (WELLBUTRIN XL) 150 MG 24 hr tablet TAKE 1 TABLET BY MOUTH EVERY MORNING 07/20/20  Yes Midge Minium, MD  escitalopram (LEXAPRO) 20 MG tablet TAKE 1 TABLET BY MOUTH DAILY 02/04/20  Yes Midge Minium, MD  estradiol (VIVELLE-DOT) 0.0375 MG/24HR Place onto the skin 2 (two) times a week. 02/04/20  Yes [provider]  fexofenadine (ALLEGRA) 180 MG tablet Take 180 mg by mouth daily.   Yes [provider]  LYSINE PO Take by mouth daily.   Yes [provider]  modafinil (PROVIGIL) 200 MG tablet TAKE 1 TABLET BY MOUTH DAILY 04/28/20  Yes Young, Clinton D, MD  montelukast (SINGULAIR) 10 MG tablet TAKE 1 TABLET BY MOUTH ONCE DAILY AT BEDTIME 03/31/20  Yes Baird Lyons D, MD  omeprazole (PRILOSEC) 40 MG capsule TAKE 1 CAPSULE BY MOUTH DAILY 06/01/20  Yes Midge Minium, MD  predniSONE (DELTASONE) 10 MG tablet 2 x 5 days, 1 x 5 days, then stop 08/06/20  Yes Baird Lyons D, MD  PREVIDENT 5000 BOOSTER PLUS 1.1 % PSTE  05/22/18  Yes [provider]  Respiratory Therapy Supplies (FLUTTER) DEVI Use as directed 07/23/15  Yes Baird Lyons D, MD  TRELEGY ELLIPTA 100-62.5-25 MCG/INH AEPB INHALE 1 PUFF INTO THE LUNGS DAILY 06/01/20  Yes Baird Lyons D, MD  valsartan-hydrochlorothiazide (DIOVAN-HCT) 160-25 MG tablet TAKE 1 TABLET BY MOUTH DAILY 04/13/20  Yes Midge Minium, MD  ALPRAZolam Duanne Moron)  0.5 MG tablet TAKE 1 TABLET BY MOUTH 2 TIMES DAILY AS NEEDED FOR ANXIETY Patient not taking: No sig reported 03/30/20   Midge Minium, MD  fluticasone (FLONASE) 50 MCG/ACT nasal spray Place into the nose. 03/16/20 04/15/20  [provider]  naphazoline-pheniramine (NAPHCON-A) 0.025-0.3 % ophthalmic solution Place into both eyes.    [provider]  Omega-3 Fatty Acids (FISH OIL) 1200 MG CAPS Take by mouth daily.    [provider]    Current Outpatient Medications  Medication Sig Dispense Refill   albuterol (PROVENTIL) (2.5 MG/3ML) 0.083% nebulizer solution Take 3 mLs (2.5 mg total) by nebulization every 6 (six) hours as needed for wheezing or shortness of breath. 150 mL 11   atorvastatin (LIPITOR) 40 MG tablet Take 1 tablet (40 mg total) by mouth daily. 90 tablet 0   buPROPion (WELLBUTRIN XL) 150 MG 24 hr tablet TAKE 1 TABLET BY MOUTH EVERY MORNING 90 tablet 1   escitalopram (LEXAPRO) 20 MG tablet TAKE 1 TABLET BY MOUTH DAILY 30 tablet 6   estradiol (VIVELLE-DOT) 0.0375 MG/24HR Place onto the skin 2 (two) times a week.     fexofenadine (ALLEGRA) 180 MG tablet Take 180 mg by mouth daily.     LYSINE PO Take by mouth daily.     modafinil (PROVIGIL) 200 MG tablet TAKE 1 TABLET BY MOUTH DAILY 30 tablet 5   montelukast (SINGULAIR) 10 MG tablet TAKE 1 TABLET BY MOUTH ONCE DAILY AT BEDTIME 30 tablet 3   omeprazole (PRILOSEC) 40 MG capsule TAKE 1 CAPSULE BY MOUTH DAILY 30 capsule 6   predniSONE (DELTASONE) 10 MG tablet 2 x 5 days, 1 x 5 days, then stop 15 tablet 0   PREVIDENT 5000 BOOSTER PLUS 1.1 % PSTE      Respiratory Therapy Supplies (FLUTTER) DEVI Use as directed 1 each 0   TRELEGY ELLIPTA 100-62.5-25 MCG/INH AEPB INHALE 1 PUFF INTO THE LUNGS DAILY 60 each 11   valsartan-hydrochlorothiazide (DIOVAN-HCT) 160-25 MG tablet TAKE 1 TABLET BY MOUTH DAILY 90 tablet 1   ALPRAZolam (XANAX) 0.5 MG tablet TAKE 1 TABLET BY MOUTH 2 TIMES DAILY AS NEEDED FOR ANXIETY (Patient not  taking: No sig reported) 30 tablet 1   fluticasone (FLONASE) 50 MCG/ACT nasal spray Place into the nose.     naphazoline-pheniramine (NAPHCON-A) 0.025-0.3 % ophthalmic solution Place into both eyes.     Omega-3 Fatty Acids (FISH OIL) 1200 MG CAPS Take by mouth daily.     Current Facility-Administered Medications  Medication Dose Route Frequency Provider Last Rate Last Admin   0.9 %  sodium chloride infusion  500 mL Intravenous Once Devlyn Retter, Lajuan Lines, MD        Allergies as of 08/19/2020 - Review Complete 08/19/2020  Allergen Reaction Noted   Clarithromycin Diarrhea, Rash,  and Other (See Comments) 06/19/2014   Hydrocodone bit-homatrop mbr Other (See Comments) 09/27/2010   Hydrocodone bit-homatrop mbr Other (See Comments) 09/27/2010   Penicillins Other (See Comments) 06/19/2014   Topiramate Other (See Comments) 06/19/2014   Codeine Itching 06/19/2014    Family History  Problem Relation Age of Onset   Emphysema Mother    Heart attack Mother 67       Died age 64   Heart failure Father 37   Asthma Sister    Breast cancer Sister 50   Cancer Sister    Leukemia Brother    Asthma Maternal Uncle    Arthritis Maternal Grandmother    Cancer Maternal Grandmother    Breast cancer Maternal Grandmother    Cancer Other        aunts   Cancer Other        uncles   Colon cancer Other    Esophageal cancer Neg Hx    Stomach cancer Neg Hx    Rectal cancer Neg Hx     Social History   Socioeconomic History   Marital status: Married    Spouse name: Remo Lipps   Number of children: Not on file   Years of education: Not on file   Highest education level: Associate degree: academic program  Occupational History   Occupation: Database administrator    Comment: [employer]  Tobacco Use   Smoking status: Every Day    Packs/day: 0.25    Years: 40.00    Pack years: 10.00    Types: Cigarettes    Last attempt to quit: 11/27/2017    Years since quitting: 2.7   Smokeless tobacco: Never    Tobacco comments:    husband still smokes  Vaping Use   Vaping Use: Former  Substance and Sexual Activity   Alcohol use: Yes    Alcohol/week: 0.0 standard drinks    Comment: social   Drug use: No   Sexual activity: Not on file  Other Topics Concern   Not on file  Social History Narrative   Lives with husband.     Social Determinants of Health   Financial Resource Strain: Low Risk    Difficulty of Paying Living Expenses: Not hard at all  Food Insecurity: No Food Insecurity   Worried About Charity fundraiser in the Last Year: Never true   [place] in the Last Year: Never true  Transportation Needs: No Transportation Needs   Lack of Transportation (Medical): No   Lack of Transportation (Non-Medical): No  Physical Activity: Insufficiently Active   Days of Exercise per Week: 4 days   Minutes of Exercise per Session: 20 min  Stress: Stress Concern Present   Feeling of Stress : To some extent  Social Connections: Moderately Integrated   Frequency of Communication with Friends and Family: More than three times a week   Frequency of Social Gatherings with Friends and Family: Once a week   Attends Religious Services: 1 to 4 times per year   Active Member of [employer] or Organizations: No   Attends Music therapist: Never   Marital Status: Married  Human resources officer Violence: Not At Risk   Fear of Current or Ex-Partner: No   Emotionally Abused: No   Physically Abused: No   Sexually Abused: No    Review of Systems: Positive for none All other review of systems negative except as mentioned in the HPI.  Physical Exam: Vital signs BP 137/66  Pulse 72   Temp (!) 97.5 F (36.4 C)   Resp (!) 22   Ht '5\' 2"'$  (1.575 m)   Wt 149 lb (67.6 kg)   SpO2 96%   BMI 27.25 kg/m   General:   Alert,  Well-developed, well-nourished, pleasant and cooperative in NAD Lungs:  Clear throughout to auscultation.   Heart:  Regular rate and rhythm; no murmurs, clicks, rubs,  or  gallops. Abdomen:  Soft, nontender and nondistended. Normal bowel sounds.   Neuro/Psych:  Alert and cooperative. Normal mood and affect. A and O x 3

## 2020-08-19 NOTE — Progress Notes (Signed)
VS taken by C.W. 

## 2020-08-19 NOTE — Progress Notes (Signed)
Report to PACU, RN, vss, BBS= Clear.  

## 2020-08-20 ENCOUNTER — Other Ambulatory Visit: Payer: Self-pay | Admitting: Internal Medicine

## 2020-08-21 ENCOUNTER — Telehealth: Payer: Self-pay

## 2020-08-21 ENCOUNTER — Telehealth: Payer: Self-pay | Admitting: *Deleted

## 2020-08-21 NOTE — Telephone Encounter (Signed)
  Follow up Call-  Call back number 08/19/2020  Post procedure Call Back phone  # [phone]  Permission to leave phone message Yes  Some recent data might be hidden     Patient questions:  Phone kept ringing. No message left.

## 2020-08-21 NOTE — Telephone Encounter (Signed)
  Follow up Call-  Call back number 08/19/2020  Post procedure Call Back phone  # [phone]  Permission to leave phone message Yes  Some recent data might be hidden     Patient questions:  Do you have a fever, pain , or abdominal swelling? No. Pain Score  0 *  Have you tolerated food without any problems? Yes.    Have you been able to return to your normal activities? Yes.    Do you have any questions about your discharge instructions: Diet   No. Medications  No. Follow up visit  No.  Do you have questions or concerns about your Care? No.  Actions: * If pain score is 4 or above: No action needed, pain <4.  Have you developed a fever since your procedure? no  2.   Have you had an respiratory symptoms (SOB or cough) since your procedure? no  3.   Have you tested positive for COVID 19 since your procedure no  4.   Have you had any family members/close contacts diagnosed with the COVID 19 since your procedure?  No   Mliss Sax, RN spoke with pt re:her questions.  I was called to another phone call.  maw   If yes to any of these questions please route to Joylene John, RN and Joella Prince, RN

## 2020-09-06 ENCOUNTER — Telehealth: Payer: Self-pay | Admitting: Pulmonary Disease

## 2020-09-06 MED ORDER — NYSTATIN 100000 UNIT/ML MT SUSP: 5.0000 mL | Freq: Four times a day (QID) | OROMUCOSAL | 0 refills | Status: DC

## 2020-09-06 NOTE — Telephone Encounter (Signed)
CY the pt stated that she did get something called in from the doctor that was on call.  Nothing further is needed.

## 2020-09-06 NOTE — Telephone Encounter (Signed)
[facility]  Patient called to report thrush.  Says she has been using Trelegy Doesn't routinely rinse mouth after using inhaler.  Rec Rinse mouth after using Trelegy Will call in nystatin swish and spit to [facility] in [place] per her request Let us know if no improvement

## 2020-09-06 NOTE — Telephone Encounter (Signed)
CY please advise. Thanks  [place], [facility] [address]. Annamaria Boots,   I hope you are well.   It appears that I have thrush.  I am having difficulty swallowing, my tongue is covered in a white substance, I feel like there is mucus in my throat but when I cough it up I get a whiteish substance with a small amount of mucus, and the roof of my mouth feels like there is a coating on it, I also have a bad taste in my mouth, and I seem feel a bit of nausea.  This has been going on for quite some time but I thought it was sinus drainage until my tongue turned white on Thursday.  I brush and floss every day so my dental health is normally good.  I have started rinsing with warn salt water this weekend.  Since I use the inhalers I figure now it must be thrush but I have never had thrush before.  Can you call something in for me or can we do a virtual visit or do I need to come in?

## 2020-09-11 ENCOUNTER — Encounter: Payer: Self-pay | Admitting: Family Medicine

## 2020-10-12 ENCOUNTER — Encounter: Payer: Self-pay | Admitting: Family Medicine

## 2020-10-13 MED ORDER — VARENICLINE TARTRATE 0.5 MG X 11 & 1 MG X 42 PO MISC: ORAL | 0 refills | Status: DC

## 2020-10-14 ENCOUNTER — Other Ambulatory Visit: Payer: Self-pay | Admitting: Family Medicine

## 2020-10-14 DIAGNOSIS — E785 Hyperlipidemia, unspecified: Secondary | ICD-10-CM

## 2020-10-15 DIAGNOSIS — N393 Stress incontinence (female) (male): Secondary | ICD-10-CM | POA: Insufficient documentation

## 2020-10-28 NOTE — Progress Notes (Signed)
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 Going to [facility],     Dr [facility].  ENT eval for bilateral eustachian dysfunction.> expected gradual improvement. -----Patient states that her breathing is bad right now and she has been coughing up green sputum since this weekend. Wears oxygen at night and as needed with exertion. Dr Jonette Eva has done a wonderful job of watching over her cancer f/u with no recurrence identified so far. She has blamed "allergy" since January for waxing and waning head and chest congestion and shortness of breath. Green sputum this week without fever or adenopathy. Mentions desire to lose weight. Very limited exercise capacity.  CT chest 02/13/20- IMPRESSION: 1. No evidence of metastatic disease within the chest, abdomen, or pelvis. 2. Severe emphysema. 3. Coronary artery disease. Aortic Atherosclerosis (ICD10-I70.0) and Emphysema (ICD10-J43.9).  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 LUL density ------Follow up on COPD. Needs insurance form filled out.  She started smoking again but thinks with Chantix from Dr Birdie Riddle she can quit. Declines referral to pharmacy telephone smoking program.  Sleeps with O2 and uses POC when she can. Afraid POC might draw in viruses- discussed.  Lingering throat irritation with partial clearing after nystatin. She had had prednisone and Trelegy. If we can't clear it then consider change.Some may be from smoking. Reviewed CT images and report with her. Will get f/u CT chest. CT chest/abd/pelvis 08/12/20- IMPRESSION: 1. Interval development of extensive mural thickening in the second portion of the duodenum which is somewhat asymmetric and mass-like in appearance. Further evaluation with nonemergent endoscopy is suggested to exclude the possibility of a metastatic lesion in the proximal small bowel. 2. In addition, there is what appears to be an area of resolving airspace consolidation in the posterior aspect of the left upper lobe. This is slightly nodular mass-like in appearance, but strongly favored to be benign. Short-term repeat noncontrast chest CT is recommended in 2 months to ensure the resolution of this finding. 3. Diffuse bronchial wall thickening with severe centrilobular and paraseptal emphysema; imaging findings suggestive of underlying COPD. 4. Aortic atherosclerosis, in addition to 2 vessel coronary artery disease. Please note that although the presence of coronary  artery calcium documents the presence of coronary artery disease, the severity of this disease and any potential stenosis cannot be assessed on this non-gated CT examination. Assessment for potential risk factor modification,  dietary therapy or pharmacologic therapy may be warranted, if clinically indicated. 5. Mild colonic diverticulosis without evidence of acute diverticulitis at this time. 6. Additional incidental findings, as above.   ROS-see HPI   + = positive Constitutional:   No-   weight loss, night sweats, fevers, chills, +fatigue, lassitude. HEENT:   No-  headaches, difficulty swallowing, tooth/dental problems, sore throat,       No-  sneezing, itching, ear ache, +nasal congestion, +post nasal drip,  CV:  No-   chest pain, orthopnea, PND, swelling in lower extremities, anasarca, dizziness, palpitations Resp: + shortness of breath with exertion or at rest.                +productive cough,  + non-productive cough,  No- coughing up of blood.              +change in color of mucus.  + wheezing.   Skin: No-   rash or lesions. GI:  No-   heartburn, indigestion, abdominal pain, nausea, vomiting,  GU:  MS:  No-   joint pain or swelling.   Neuro-     nothing unusual Psych:  No- change in mood or affect. No depression or anxiety.  No memory loss.  OBJ- Physical Exam     General- Alert, Oriented, Affect-appropriate,  + overweight Skin- clear  Lymphadenopathy- none Head- atraumatic            Eyes- + R prosthesis            Ears- Hearing, canals-normal            Nose- clear, no-Septal dev, mucus, polyps, erosion, perforation             Throat- Mallampati IV , mucosa clear/  drainage- none, tonsils- small residual Neck- flexible , trachea midline, no stridor , thyroid nl, carotid no bruit Chest - symmetrical excursion , unlabored           Heart/CV- RRR , no murmur , no gallop  , no rub, nl s1 s2                           - JVD- none , edema- none, stasis changes- none, varices-  none           Lung-  +distant/ clear, Wheeze- none, cough-none , dullness-none, rub- none,             Chest wall-  Abd-  Br/ Gen/ Rectal- Not done, not indicated Extrem- cyanosis- none, clubbing, none, atrophy- none, strength- nl Neuro- grossly intact to observation

## 2020-10-29 ENCOUNTER — Ambulatory Visit (INDEPENDENT_AMBULATORY_CARE_PROVIDER_SITE_OTHER): Payer: Medicare Other | Admitting: Internal Medicine

## 2020-10-29 ENCOUNTER — Encounter: Payer: Self-pay | Admitting: Internal Medicine

## 2020-10-29 ENCOUNTER — Other Ambulatory Visit: Payer: Self-pay

## 2020-10-29 ENCOUNTER — Other Ambulatory Visit: Payer: Self-pay | Admitting: Internal Medicine

## 2020-10-29 VITALS — BP 110/66 | HR 95 | Temp 97.9°F | Ht 62.0 in | Wt 145.2 lb

## 2020-10-29 DIAGNOSIS — Z23 Encounter for immunization: Secondary | ICD-10-CM

## 2020-10-29 DIAGNOSIS — Z720 Tobacco use: Secondary | ICD-10-CM

## 2020-10-29 DIAGNOSIS — J449 Chronic obstructive pulmonary disease, unspecified: Secondary | ICD-10-CM | POA: Diagnosis not present

## 2020-10-29 DIAGNOSIS — J9611 Chronic respiratory failure with hypoxia: Secondary | ICD-10-CM

## 2020-10-29 DIAGNOSIS — R918 Other nonspecific abnormal finding of lung field: Secondary | ICD-10-CM

## 2020-10-29 MED ORDER — FLUCONAZOLE 150 MG PO TABS: 150.0000 mg | ORAL_TABLET | Freq: Every day | ORAL | 0 refills | Status: DC

## 2020-10-29 NOTE — Assessment & Plan Note (Signed)
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 Reviewed with her. Plan- CT chest, no contrast.

## 2020-10-29 NOTE — Assessment & Plan Note (Signed)
Continues oxygen dependent due to severe COPD. She remains total and permanent disability status.  Plan- continue O2 sleep and portable 2L, insurance forms today, flu vax.

## 2020-10-29 NOTE — Patient Instructions (Signed)
Order- schedule CT chest      dx abnormal CT chest  Order- flu vax standard  Please work on the smoking. If we can help, let us know.  I will take care of the insurance papers.

## 2020-10-29 NOTE — Assessment & Plan Note (Signed)
Unfortunately smoking again. She thinks she can stop again with Chantix which helped last time.  Plan- strongly encouraged to quit.

## 2020-10-29 NOTE — Assessment & Plan Note (Signed)
Currently at baseline. Meds appropriate Plan - call for refills

## 2020-11-03 ENCOUNTER — Ambulatory Visit (HOSPITAL_BASED_OUTPATIENT_CLINIC_OR_DEPARTMENT_OTHER)
Admission: RE | Admit: 2020-11-03 | Discharge: 2020-11-03 | Disposition: A | Discharge status: Home / Self Care | Payer: Medicare Other | Source: Ambulatory Visit | Attending: Internal Medicine | Admitting: Internal Medicine

## 2020-11-03 ENCOUNTER — Other Ambulatory Visit: Payer: Self-pay

## 2020-11-03 DIAGNOSIS — R918 Other nonspecific abnormal finding of lung field: Secondary | ICD-10-CM | POA: Diagnosis present

## 2020-11-04 ENCOUNTER — Telehealth: Payer: Self-pay

## 2020-11-05 NOTE — Telephone Encounter (Signed)
Created in error

## 2020-11-19 ENCOUNTER — Other Ambulatory Visit: Payer: Self-pay | Admitting: Internal Medicine

## 2020-11-23 ENCOUNTER — Ambulatory Visit (INDEPENDENT_AMBULATORY_CARE_PROVIDER_SITE_OTHER): Payer: Medicare Other | Admitting: Family Medicine

## 2020-11-23 ENCOUNTER — Encounter: Payer: Self-pay | Admitting: Family Medicine

## 2020-11-23 VITALS — BP 110/78 | HR 84 | Temp 98.0°F | Resp 16 | Ht 62.5 in | Wt 143.2 lb

## 2020-11-23 DIAGNOSIS — Z0000 Encounter for general adult medical examination without abnormal findings: Secondary | ICD-10-CM | POA: Diagnosis not present

## 2020-11-23 DIAGNOSIS — E559 Vitamin D deficiency, unspecified: Secondary | ICD-10-CM | POA: Diagnosis not present

## 2020-11-23 DIAGNOSIS — I700 Atherosclerosis of aorta: Secondary | ICD-10-CM | POA: Diagnosis not present

## 2020-11-23 DIAGNOSIS — Z114 Encounter for screening for human immunodeficiency virus [HIV]: Secondary | ICD-10-CM

## 2020-11-23 DIAGNOSIS — I10 Essential (primary) hypertension: Secondary | ICD-10-CM | POA: Diagnosis not present

## 2020-11-23 DIAGNOSIS — Z1159 Encounter for screening for other viral diseases: Secondary | ICD-10-CM

## 2020-11-23 LAB — HEPATIC FUNCTION PANEL
ALT: 30 U/L (ref 0–35)
AST: 19 U/L (ref 0–37)
Albumin: 4.3 g/dL (ref 3.5–5.2)
Alkaline Phosphatase: 107 U/L (ref 39–117)
Bilirubin, Direct: 0.1 mg/dL (ref 0.0–0.3)
Total Bilirubin: 0.4 mg/dL (ref 0.2–1.2)
Total Protein: 6.3 g/dL (ref 6.0–8.3)

## 2020-11-23 LAB — BASIC METABOLIC PANEL
BUN: 13 mg/dL (ref 6–23)
CO2: 30 mEq/L (ref 19–32)
Calcium: 9.3 mg/dL (ref 8.4–10.5)
Chloride: 100 mEq/L (ref 96–112)
Creatinine, Ser: 0.51 mg/dL (ref 0.40–1.20)
GFR: 99.01 mL/min (ref >60.00)
Glucose, Bld: 85 mg/dL (ref 70–99)
Potassium: 3.7 mEq/L (ref 3.5–5.1)
Sodium: 140 mEq/L (ref 135–145)

## 2020-11-23 LAB — LIPID PANEL
Cholesterol: 145 mg/dL (ref 0–200)
HDL: 58.3 mg/dL (ref >39.00)
LDL Cholesterol: 63 mg/dL (ref 0–99)
NonHDL: 86.75
Total CHOL/HDL Ratio: 2
Triglycerides: 121 mg/dL (ref 0.0–149.0)
VLDL: 24.2 mg/dL (ref 0.0–40.0)

## 2020-11-23 LAB — CBC WITH DIFFERENTIAL/PLATELET
Basophils Absolute: 0.1 10*3/uL (ref 0.0–0.1)
Basophils Relative: 1.1 % (ref 0.0–3.0)
Eosinophils Absolute: 0.3 10*3/uL (ref 0.0–0.7)
Eosinophils Relative: 4.2 % (ref 0.0–5.0)
HCT: 44.8 % (ref 36.0–46.0)
Hemoglobin: 15.1 g/dL — ABNORMAL HIGH (ref 12.0–15.0)
Lymphocytes Relative: 25 % (ref 12.0–46.0)
Lymphs Abs: 1.5 10*3/uL (ref 0.7–4.0)
MCHC: 33.6 g/dL (ref 30.0–36.0)
MCV: 95.8 fl (ref 78.0–100.0)
Monocytes Absolute: 0.6 10*3/uL (ref 0.1–1.0)
Monocytes Relative: 9.9 % (ref 3.0–12.0)
Neutro Abs: 3.5 10*3/uL (ref 1.4–7.7)
Neutrophils Relative %: 59.8 % (ref 43.0–77.0)
Platelets: 277 10*3/uL (ref 150.0–400.0)
RBC: 4.68 Mil/uL (ref 3.87–5.11)
RDW: 12.7 % (ref 11.5–15.5)
WBC: 5.9 10*3/uL (ref 4.0–10.5)

## 2020-11-23 LAB — VITAMIN D 25 HYDROXY (VIT D DEFICIENCY, FRACTURES): VITD: 46.06 ng/mL (ref 30.00–100.00)

## 2020-11-23 LAB — TSH: TSH: 3.09 u[IU]/mL (ref 0.35–5.50)

## 2020-11-23 MED ORDER — CHANTIX STARTING MONTH PAK 0.5 MG X 11 & 1 MG X 42 PO TBPK: 1.0000 | ORAL_TABLET | ORAL | 0 refills | Status: DC

## 2020-11-23 NOTE — Progress Notes (Signed)
Subjective:    Patient ID: Carol Quinn, female    DOB: 10-21-1955, 65 y.o.   MRN: [id number]  HPI CPE- UTD on colonoscopy, mammo, PNA, Tdap, flu.  Patient Care Team    Relationship Specialty Notifications Start End  Midge Minium, MD PCP - General Family Medicine  07/17/10   Minus Breeding, MD PCP - Cardiology Cardiology  09/19/19   Lynder Parents., MD  Obstetrics and Gynecology  02/14/14   Hale Bogus., MD Referring Physician Gastroenterology  02/14/14   Iran Ouch, MD Referring Physician Ophthalmology  05/10/17   Midge Minium, MD Referring Physician Family Medicine  07/10/19     Health Maintenance  Topic Date Due   HIV Screening  Never done   Hepatitis C Screening  Never done   Zoster Vaccines- Shingrix (2 of 2) 11/26/2018   COVID-19 Vaccine (4 - Booster for Moderna series) 01/15/2020   MAMMOGRAM  03/11/2022   Pneumococcal Vaccine 74-67 Years old (4 - PPSV23 if available, else PCV20) 10/31/2022   TETANUS/TDAP  12/22/2022   COLONOSCOPY (Pts 45-39yrs Insurance coverage will need to be confirmed)  03/22/2027   INFLUENZA VACCINE  Completed   HPV VACCINES  Aged Out      Review of Systems Patient reports no vision/ hearing changes, adenopathy,fever, weight change,  persistant/recurrent hoarseness , swallowing issues, chest pain, palpitations, edema, hemoptysis, gastrointestinal bleeding (melena, rectal bleeding), abdominal pain, significant heartburn, bowel changes, GU symptoms (dysuria, hematuria, incontinence), Gyn symptoms (abnormal  bleeding, pain),  syncope, focal weakness, memory loss, numbness & tingling, skin/hair/nail changes, abnormal bruising or bleeding  + chronic SOB due to COPD + depression- she is not interested in changing medication, not interested in counseling. + cough- pt started smoking again  This visit occurred during the SARS-CoV-2 public health emergency.  Safety protocols were in place, including screening questions prior to the visit,  additional usage of staff PPE, and extensive cleaning of exam room while observing appropriate contact time as indicated for disinfecting solutions.      Objective:   Physical Exam General Appearance:    Alert, cooperative, no distress, appears stated age  Head:    Normocephalic, without obvious abnormality, atraumatic  Eyes:    PERRL, conjunctiva/corneas clear, EOM's intact L eye  Ears:    Normal TM's and external ear canals, both ears  Nose:   Deferred due to COVID  Throat:   Neck:   Supple, symmetrical, trachea midline, no adenopathy;    Thyroid: no enlargement/tenderness/nodules  Back:     Symmetric, no curvature, ROM normal, no CVA tenderness  Lungs:     Decreased air movement bilaterally  Chest Wall:    No tenderness or deformity   Heart:    Regular rate and rhythm, S1 and S2 normal, no murmur, rub   or gallop  Breast Exam:    Deferred to mammo  Abdomen:     Soft, non-tender, bowel sounds active all four quadrants,    no masses, no organomegaly  Genitalia:    Deferred to GYN  Rectal:    Extremities:   Extremities normal, atraumatic, no cyanosis or edema  Pulses:   2+ and symmetric all extremities  Skin:   Skin color, texture, turgor normal, no rashes or lesions  Lymph nodes:   Cervical, supraclavicular, and axillary nodes normal  Neurologic:   CNII-XII intact, normal strength, sensation and reflexes    throughout          Assessment & Plan:

## 2020-11-23 NOTE — Assessment & Plan Note (Signed)
Goal is risk reduction w/ statin.

## 2020-11-23 NOTE — Assessment & Plan Note (Signed)
Pt's PE WNL and unchanged from previous.  UTD on pap, mammo, colonoscopy, immunizations.  Check labs.  Anticipatory guidance provided.

## 2020-11-23 NOTE — Assessment & Plan Note (Signed)
Chronic problem.  Excellent control.  Asymptomatic.  Check labs.  No anticipated med changes. 

## 2020-11-23 NOTE — Patient Instructions (Addendum)
Follow up in 6 months to recheck BP and cholesterol We'll notify you of your lab results and make any changes if needed Keep up the good work on healthy diet and regular exercise- you're doing great! Try and quit smoking!  You can do it! Call with any questions or concerns Stay Safe!  Stay Healthy! Hang in there!!!

## 2020-11-23 NOTE — Assessment & Plan Note (Signed)
Check labs and replete prn. 

## 2020-11-24 LAB — HEPATITIS C ANTIBODY
Hepatitis C Ab: NONREACTIVE
SIGNAL TO CUT-OFF: 0.08 (ref <1.00)

## 2020-11-24 LAB — HIV ANTIBODY (ROUTINE TESTING W REFLEX): HIV 1&2 Ab, 4th Generation: NONREACTIVE

## 2020-11-26 NOTE — Telephone Encounter (Signed)
Modafinil refilled.

## 2020-11-26 NOTE — Telephone Encounter (Signed)
Spoke with patient regarding refill that Dr. Annamaria Boots sent in . They verbalized understanding. No further questions.  Nothing further needed at this time.

## 2020-11-26 NOTE — Telephone Encounter (Signed)
Requesting refills Last seen on 10/29/2020 Next OV is 04/29/2021  Last refill was on 04/28/2020 for #30 with 5 refills.    thanks

## 2020-11-27 ENCOUNTER — Telehealth: Payer: Self-pay

## 2020-11-27 NOTE — Telephone Encounter (Signed)
Spoke to patient and made her aware of lab results 

## 2020-11-27 NOTE — Telephone Encounter (Signed)
-----   Message from [place], [place] sent at 11/27/2020 12:01 PM EST -----  ----- Message ----- From: Midge Minium, MD Sent: 11/24/2020   7:22 AM EST To: Kelton Pillar, CMA  Labs look fantastic!  No changes at this time

## 2020-11-30 ENCOUNTER — Telehealth: Payer: Self-pay | Admitting: Internal Medicine

## 2020-11-30 ENCOUNTER — Encounter: Payer: Self-pay | Admitting: Internal Medicine

## 2020-12-01 ENCOUNTER — Other Ambulatory Visit (HOSPITAL_COMMUNITY): Payer: Self-pay

## 2020-12-01 NOTE — Telephone Encounter (Signed)
[facility] [employer] was disconnected after being on hold for several minutes. Will attempt to contact at later time.

## 2020-12-02 NOTE — Telephone Encounter (Signed)
I have attempted to call [facility] was was on hold for almost 15 mins.  Will attempt to call back later.

## 2020-12-14 ENCOUNTER — Ambulatory Visit: Payer: Medicare Other

## 2020-12-17 ENCOUNTER — Ambulatory Visit (INDEPENDENT_AMBULATORY_CARE_PROVIDER_SITE_OTHER): Payer: Medicare Other

## 2020-12-17 DIAGNOSIS — Z0000 Encounter for general adult medical examination without abnormal findings: Secondary | ICD-10-CM | POA: Diagnosis not present

## 2020-12-17 NOTE — Progress Notes (Signed)
Subjective:   Carol Quinn is a 65 y.o. female who presents for Medicare Annual (Subsequent) preventive examination.  I connected with Carol Quinn today by telephone and verified that I am speaking with the correct person using two identifiers. Location patient: home Location provider: work Persons participating in the virtual visit: patient, provider.   I discussed the limitations, risks, security and privacy concerns of performing an evaluation and management service by telephone and the availability of in person appointments. I also discussed with the patient that there may be a patient responsible charge related to this service. The patient expressed understanding and verbally consented to this telephonic visit.    Interactive audio and video telecommunications were attempted between this provider and patient, however failed, due to patient having technical difficulties OR patient did not have access to video capability.  We continued and completed visit with audio only.    Review of Systems     Cardiac Risk Factors include: advanced age (>73men, >62 women);dyslipidemia;hypertension     Objective:    Today's Vitals   There is no height or weight on file to calculate BMI.  Advanced Directives 12/17/2020 08/12/2020 02/13/2020 12/09/2019 10/11/2019 08/23/2019 05/29/2018  Does Patient Have a Medical Advance Directive? Yes Yes Yes Yes Yes Yes Yes  Type of Paramedic of [place];Living will [place];Living will [place];Living will [place];Living will [place];Living will [place];Living will Living will;Healthcare Power of Attorney  Does patient want to make changes to medical advance directive? - - - - No - Patient declined No - Patient declined No - Patient declined  Copy of [place] in Chart? No - copy requested - - No - copy requested - No -  copy requested No - copy requested    Current Medications (verified) Outpatient Encounter Medications as of 12/17/2020  Medication Sig   albuterol (PROVENTIL) (2.5 MG/3ML) 0.083% nebulizer solution Take 3 mLs (2.5 mg total) by nebulization every 6 (six) hours as needed for wheezing or shortness of breath.   atorvastatin (LIPITOR) 40 MG tablet TAKE 1 TABLET BY MOUTH DAILY   buPROPion (WELLBUTRIN XL) 150 MG 24 hr tablet TAKE 1 TABLET BY MOUTH EVERY MORNING   Epinastine HCl 0.05 % ophthalmic solution Apply to eye.   escitalopram (LEXAPRO) 20 MG tablet TAKE 1 TABLET BY MOUTH DAILY   estradiol (VIVELLE-DOT) 0.0375 MG/24HR Place onto the skin 2 (two) times a week.   fexofenadine (ALLEGRA) 180 MG tablet Take 180 mg by mouth daily.   LYSINE PO Take by mouth daily.   modafinil (PROVIGIL) 200 MG tablet TAKE 1 TABLET BY MOUTH DAILY   montelukast (SINGULAIR) 10 MG tablet TAKE 1 TABLET BY MOUTH ONCE DAILY AT BEDTIME   Omega-3 Fatty Acids (FISH OIL) 1200 MG CAPS Take by mouth daily.   omeprazole (PRILOSEC) 40 MG capsule TAKE 1 CAPSULE BY MOUTH DAILY   PREVIDENT 5000 BOOSTER PLUS 1.1 % PSTE    Respiratory Therapy Supplies (FLUTTER) DEVI Use as directed   TRELEGY ELLIPTA 100-62.5-25 MCG/INH AEPB INHALE 1 PUFF INTO THE LUNGS DAILY   valsartan-hydrochlorothiazide (DIOVAN-HCT) 160-25 MG tablet TAKE 1 TABLET BY MOUTH DAILY   Varenicline Tartrate, Starter, (CHANTIX STARTING MONTH PAK) 0.5 MG X 11 & 1 MG X 42 TBPK Take 1 tablet by mouth as directed. Take one 0.5 mg tab PO once daily x3 days, then increase to one 0.5 mg tab BID x4 days, then  increase to one 1 mg tab BID.   fluticasone (FLONASE) 50 MCG/ACT nasal spray Place into the nose.   naphazoline-pheniramine (NAPHCON-A) 0.025-0.3 % ophthalmic solution Place into both eyes. (Patient not taking: Reported on 12/17/2020)   Facility-Administered Encounter Medications as of 12/17/2020  Medication   0.9 %  sodium chloride infusion    Allergies  (verified) Clarithromycin, Hydrocodone bit-homatrop mbr, Hydrocodone bit-homatrop mbr, Penicillins, Topiramate, and Codeine   History: Past Medical History:  Diagnosis Date   Acute bronchitis    Allergic rhinitis    Allergy    Anxiety    Aortic atherosclerosis (HCC)    Cancer (HCC)    Chronic headaches    COPD (chronic obstructive pulmonary disease) (HCC)    emphysema   Depression    Emphysema of lung (HCC)    Emphysema, unspecified (HCC)    Exposure to TB    uncle-her PPD neg   GERD (gastroesophageal reflux disease)    Hepatic cyst    left   Hyperlipidemia    Hypertension    MDS (myelodysplastic syndrome), low grade ([place]) 09/23/2016   Ocular melanoma, 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   Oxygen deficiency    RBBB (right bundle branch block with left anterior fascicular block)    Past Surgical History:  Procedure Laterality Date   BREAST BIOPSY Left 2002   COLONOSCOPY WITH PROPOFOL N/A 03/21/2017   Procedure: COLONOSCOPY WITH PROPOFOL;  Surgeon: Jerene Bears, MD;  Location: [facility];  Service: Gastroenterology;  Laterality: N/A;   NOSE SURGERY     "nasal deviation"   TOTAL ABDOMINAL HYSTERECTOMY     TRIGGER FINGER RELEASE Right 06/05/2018   Procedure: RIGHT THUMB RELEASE TRIGGER FINGER/A-1 PULLEY;  Surgeon: Daryll Brod, MD;  Location: [facility];  Service: Orthopedics;  Laterality: Right;   TUBAL LIGATION     Family History  Problem Relation Age of Onset   Emphysema Mother    Heart attack Mother 3       Died age 83   Heart failure Father 102   Asthma Sister    Breast cancer Sister 71   Cancer Sister    Leukemia Brother    Asthma Maternal Uncle    Arthritis Maternal Grandmother    Cancer Maternal Grandmother    Breast cancer Maternal Grandmother    Cancer Other        aunts   Cancer Other        uncles   Colon cancer Other    Esophageal cancer Neg Hx    Stomach cancer Neg Hx    Rectal cancer Neg Hx    Social History   Socioeconomic  History   Marital status: Married    Spouse name: Remo Lipps   Number of children: Not on file   Years of education: Not on file   Highest education level: Associate degree: academic program  Occupational History   Occupation: Database administrator    Comment: [employer]  Tobacco Use   Smoking status: Every Day    Packs/day: 0.25    Years: 40.00    Pack years: 10.00    Types: Cigarettes    Last attempt to quit: 11/27/2017    Years since quitting: 3.0   Smokeless tobacco: Never   Tobacco comments:    husband still smokes  Vaping Use   Vaping Use: Former  Substance and Sexual Activity   Alcohol use: Yes    Alcohol/week: 0.0 standard drinks    Comment: social  Drug use: No   Sexual activity: Not on file  Other Topics Concern   Not on file  Social History Narrative   Lives with husband.     Social Determinants of Health   Financial Resource Strain: Low Risk    Difficulty of Paying Living Expenses: Not hard at all  Food Insecurity: No Food Insecurity   Worried About Charity fundraiser in the Last Year: Never true   [place] in the Last Year: Never true  Transportation Needs: No Transportation Needs   Lack of Transportation (Medical): No   Lack of Transportation (Non-Medical): No  Physical Activity: Inactive   Days of Exercise per Week: 0 days   Minutes of Exercise per Session: 0 min  Stress: No Stress Concern Present   Feeling of Stress : Only a little  Social Connections: Moderately Isolated   Frequency of Communication with Friends and Family: Twice a week   Frequency of Social Gatherings with Friends and Family: Twice a week   Attends Religious Services: Never   Printmaker: No   Attends Music therapist: Never   Marital Status: Married    Tobacco Counseling Ready to quit: Not Answered Counseling given: Not Answered Tobacco comments: husband still smokes   Clinical Intake:  Pre-visit preparation  completed: Yes  Pain : No/denies pain     Nutritional Risks: None Diabetes: No  How often do you need to have someone help you when you read instructions, pamphlets, or other written materials from your doctor or pharmacy?: 1 - Never What is the last grade level you completed in school?: college  Diabetic?no   Interpreter Needed?: No  Information entered by :: [place] of Daily Living In your present state of health, do you have any difficulty performing the following activities: 12/17/2020 11/23/2020  Hearing? N N  Vision? N N  Difficulty concentrating or making decisions? N Y  Walking or climbing stairs? N N  Dressing or bathing? N N  Doing errands, shopping? N N  Preparing Food and eating ? N -  Using the Toilet? N -  In the past six months, have you accidently leaked urine? N -  Do you have problems with loss of bowel control? N -  Managing your Medications? N -  Managing your Finances? N -  Housekeeping or managing your Housekeeping? N -  Some recent data might be hidden    Patient Care Team: Midge Minium, MD as PCP - General (Family Medicine) Minus Breeding, MD as PCP - Cardiology (Cardiology) Lynder Parents., MD (Obstetrics and Gynecology) Hale Bogus., MD as Referring Physician (Gastroenterology) Iran Ouch, MD as Referring Physician (Ophthalmology) Midge Minium, MD as Referring Physician (Family Medicine)  Indicate any recent Medical Services you may have received from other than [facility] providers in the past year (date may be approximate).     Assessment:   This is a routine wellness examination for Carol Quinn.  Hearing/Vision screen Vision Screening - Comments:: Annual eye exams wear glasses   Dietary issues and exercise activities discussed: Current Exercise Habits: The patient does not participate in regular exercise at present, Exercise limited by: respiratory conditions(s)   Goals Addressed              This Visit's Progress    Patient Stated   On track    Drink more water and increase activity as tolerated       Depression  Screen PHQ 2/9 Scores 12/17/2020 12/17/2020 11/23/2020 12/09/2019 11/21/2019 06/27/2019 05/20/2019  PHQ - 2 Score 0 0 6 1 0 0 0  PHQ- 9 Score - - 12 - 0 - 0  Exception Documentation - - - - - - -    Fall Risk Fall Risk  12/17/2020 11/23/2020 12/09/2019 11/21/2019 06/27/2019  Falls in the past year? 0 1 0 0 0  Number falls in past yr: 0 1 0 0 0  Injury with Fall? 0 1 0 0 0  Risk for fall due to : - History of fall(s) - No Fall Risks -  Follow up Falls evaluation completed Falls evaluation completed Falls prevention discussed - Falls evaluation completed    [place]:  Any stairs in or around the home? Yes  If so, are there any without handrails? No  Home free of loose throw rugs in walkways, pet beds, electrical cords, etc? Yes  Adequate lighting in your home to reduce risk of falls? Yes   ASSISTIVE DEVICES UTILIZED TO PREVENT FALLS:  Life alert? No  Use of a cane, walker or w/c? No  Grab bars in the bathroom? Yes  Shower chair or bench in shower? No  Elevated toilet seat or a handicapped toilet? No   Cognitive Function:Normal cognitive status assessed by direct observation by this Nurse Health Advisor. No abnormalities found.     Montreal Cognitive Assessment  08/02/2013  Visuospatial/ Executive (0/5) 5  Naming (0/3) 3  Attention: Read list of digits (0/2) 2  Attention: Read list of letters (0/1) 0  Attention: Serial 7 subtraction starting at 100 (0/3) 3  Language: Repeat phrase (0/2) 2  Language : Fluency (0/1) 1  Abstraction (0/2) 2  Delayed Recall (0/5) 4  Orientation (0/6) 6  Total 28  Adjusted Score (based on education) 28      Immunizations Immunization History  Administered Date(s) Administered   Influenza Split 10/21/2011, 10/25/2012, 12/24/2014, 10/15/2015   Influenza Whole 09/10/2008, 10/10/2009,  09/11/2010   Influenza,inj,Quad PF,6+ Mos 09/10/2013, 10/28/2016, 10/03/2017, 10/01/2018, 10/30/2019, 10/29/2020   Moderna Sars-Covid-2 Vaccination 03/28/2019, 04/24/2019, 11/20/2019   Pneumococcal Conjugate-13 01/31/2017   Pneumococcal Polysaccharide-23 09/11/2007, 10/30/2017   Tdap 12/21/2012   Zoster Recombinat (Shingrix) 10/01/2018   Zoster, Live 02/05/2019    TDAP status: Up to date  Flu Vaccine status: Up to date  Pneumococcal vaccine status: Up to date  Covid-19 vaccine status: Completed vaccines  Qualifies for Shingles Vaccine? Yes   Zostavax completed Yes   Shingrix Completed?: Yes  Screening Tests Health Maintenance  Topic Date Due   Zoster Vaccines- Shingrix (2 of 2) 11/26/2018   COVID-19 Vaccine (4 - Booster for Moderna series) 01/15/2020   MAMMOGRAM  03/10/2021   Pneumococcal Vaccine 41-31 Years old (4 - PPSV23 if available, else PCV20) 10/31/2022   TETANUS/TDAP  12/22/2022   COLONOSCOPY (Pts 45-44yrs Insurance coverage will need to be confirmed)  03/22/2027   INFLUENZA VACCINE  Completed   Hepatitis C Screening  Completed   HIV Screening  Completed   HPV VACCINES  Aged Out    Health Maintenance  Health Maintenance Due  Topic Date Due   Zoster Vaccines- Shingrix (2 of 2) 11/26/2018   COVID-19 Vaccine (4 - Booster for Moderna series) 01/15/2020    Colorectal cancer screening: Type of screening: Colonoscopy. Completed 03/21/2017. Repeat every 10 years  Mammogram status: Completed 03/10/2020. Repeat every year  Bone Density status: Ordered not of age . Pt provided with contact info and  advised to call to schedule appt.  Lung Cancer Screening: (Low Dose CT Chest recommended if Age 66-80 years, 30 pack-year currently smoking OR have quit w/in 15years.) does not qualify.   Lung Cancer Screening Referral: n/a  Additional Screening:  Hepatitis C Screening: does not qualify; Completed 11/23/2020  Vision Screening: Recommended annual ophthalmology exams  for early detection of glaucoma and other disorders of the eye. Is the patient up to date with their annual eye exam?  Yes  Who is the provider or what is the name of the office in which the patient attends annual eye exams? [facility]  If pt is not established with a provider, would they like to be referred to a provider to establish care? No .   Dental Screening: Recommended annual dental exams for proper oral hygiene  Community Resource Referral / Chronic Care Management: CRR required this visit?  No   CCM required this visit?  No      Plan:     I have personally reviewed and noted the following in the patient's chart:   Medical and social history Use of alcohol, tobacco or illicit drugs  Current medications and supplements including opioid prescriptions.  Functional ability and status Nutritional status Physical activity Advanced directives List of other physicians Hospitalizations, surgeries, and ER visits in previous 12 months Vitals Screenings to include cognitive, depression, and falls Referrals and appointments  In addition, I have reviewed and discussed with patient certain preventive protocols, quality metrics, and best practice recommendations. A written personalized care plan for preventive services as well as general preventive health recommendations were provided to patient.     Randel Pigg, LPN   [id number]   Nurse Notes: none

## 2020-12-17 NOTE — Patient Instructions (Signed)
Carol Quinn , Thank you for taking time to come for your Medicare Wellness Visit. I appreciate your ongoing commitment to your health goals. Please review the following plan we discussed and let me know if I can assist you in the future.   Screening recommendations/referrals: Colonoscopy: 03/21/2017 Mammogram: 03/10/2020 Bone Density: not of age  Recommended yearly ophthalmology/optometry visit for glaucoma screening and checkup Recommended yearly dental visit for hygiene and checkup  Vaccinations: Influenza vaccine: completed  Pneumococcal vaccine: completed  Tdap vaccine: 12/21/2012 Shingles vaccine: completed     Advanced directives: none   Conditions/risks identified: none   Next appointment: none    Preventive Care 7 Years and Older, Female Preventive care refers to lifestyle choices and visits with your health care provider that can promote health and wellness. What does preventive care include? A yearly physical exam. This is also called an annual well check. Dental exams once or twice a year. Routine eye exams. Ask your health care provider how often you should have your eyes checked. Personal lifestyle choices, including: Daily care of your teeth and gums. Regular physical activity. Eating a healthy diet. Avoiding tobacco and drug use. Limiting alcohol use. Practicing safe sex. Taking low-dose aspirin every day. Taking vitamin and mineral supplements as recommended by your health care provider. What happens during an annual well check? The services and screenings done by your health care provider during your annual well check will depend on your age, overall health, lifestyle risk factors, and family history of disease. Counseling  Your health care provider may ask you questions about your: Alcohol use. Tobacco use. Drug use. Emotional well-being. Home and relationship well-being. Sexual activity. Eating habits. History of falls. Memory and ability to  understand (cognition). Work and work Statistician. Reproductive health. Screening  You may have the following tests or measurements: Height, weight, and BMI. Blood pressure. Lipid and cholesterol levels. These may be checked every 5 years, or more frequently if you are over 45 years old. Skin check. Lung cancer screening. You may have this screening every year starting at age 34 if you have a 30-pack-year history of smoking and currently smoke or have quit within the past 15 years. Fecal occult blood test (FOBT) of the stool. You may have this test every year starting at age 59. Flexible sigmoidoscopy or colonoscopy. You may have a sigmoidoscopy every 5 years or a colonoscopy every 10 years starting at age 50. Hepatitis C blood test. Hepatitis B blood test. Sexually transmitted disease (STD) testing. Diabetes screening. This is done by checking your blood sugar (glucose) after you have not eaten for a while (fasting). You may have this done every 1-3 years. Bone density scan. This is done to screen for osteoporosis. You may have this done starting at age 59. Mammogram. This may be done every 1-2 years. Talk to your health care provider about how often you should have regular mammograms. Talk with your health care provider about your test results, treatment options, and if necessary, the need for more tests. Vaccines  Your health care provider may recommend certain vaccines, such as: Influenza vaccine. This is recommended every year. Tetanus, diphtheria, and acellular pertussis (Tdap, Td) vaccine. You may need a Td booster every 10 years. Zoster vaccine. You may need this after age 46. Pneumococcal 13-valent conjugate (PCV13) vaccine. One dose is recommended after age 9. Pneumococcal polysaccharide (PPSV23) vaccine. One dose is recommended after age 89. Talk to your health care provider about which screenings and vaccines you need and  how often you need them. This information is not  intended to replace advice given to you by your health care provider. Make sure you discuss any questions you have with your health care provider. Document Released: 01/23/2015 Document Revised: 09/16/2015 Document Reviewed: 10/28/2014 Elsevier Interactive Patient Education  2017 [place] Prevention in the Home Falls can cause injuries. They can happen to people of all ages. There are many things you can do to make your home safe and to help prevent falls. What can I do on the outside of my home? Regularly fix the edges of walkways and driveways and fix any cracks. Remove anything that might make you trip as you walk through a door, such as a raised step or threshold. Trim any bushes or trees on the path to your home. Use bright outdoor lighting. Clear any walking paths of anything that might make someone trip, such as rocks or tools. Regularly check to see if handrails are loose or broken. Make sure that both sides of any steps have handrails. Any raised decks and porches should have guardrails on the edges. Have any leaves, snow, or ice cleared regularly. Use sand or salt on walking paths during winter. Clean up any spills in your garage right away. This includes oil or grease spills. What can I do in the bathroom? Use night lights. Install grab bars by the toilet and in the tub and shower. Do not use towel bars as grab bars. Use non-skid mats or decals in the tub or shower. If you need to sit down in the shower, use a plastic, non-slip stool. Keep the floor dry. Clean up any water that spills on the floor as soon as it happens. Remove soap buildup in the tub or shower regularly. Attach bath mats securely with double-sided non-slip rug tape. Do not have throw rugs and other things on the floor that can make you trip. What can I do in the bedroom? Use night lights. Make sure that you have a light by your bed that is easy to reach. Do not use any sheets or blankets that are  too big for your bed. They should not hang down onto the floor. Have a firm chair that has side arms. You can use this for support while you get dressed. Do not have throw rugs and other things on the floor that can make you trip. What can I do in the kitchen? Clean up any spills right away. Avoid walking on wet floors. Keep items that you use a lot in easy-to-reach places. If you need to reach something above you, use a strong step stool that has a grab bar. Keep electrical cords out of the way. Do not use floor polish or wax that makes floors slippery. If you must use wax, use non-skid floor wax. Do not have throw rugs and other things on the floor that can make you trip. What can I do with my stairs? Do not leave any items on the stairs. Make sure that there are handrails on both sides of the stairs and use them. Fix handrails that are broken or loose. Make sure that handrails are as long as the stairways. Check any carpeting to make sure that it is firmly attached to the stairs. Fix any carpet that is loose or worn. Avoid having throw rugs at the top or bottom of the stairs. If you do have throw rugs, attach them to the floor with carpet tape. Make sure that you have  a light switch at the top of the stairs and the bottom of the stairs. If you do not have them, ask someone to add them for you. What else can I do to help prevent falls? Wear shoes that: Do not have high heels. Have rubber bottoms. Are comfortable and fit you well. Are closed at the toe. Do not wear sandals. If you use a stepladder: Make sure that it is fully opened. Do not climb a closed stepladder. Make sure that both sides of the stepladder are locked into place. Ask someone to hold it for you, if possible. Clearly mark and make sure that you can see: Any grab bars or handrails. First and last steps. Where the edge of each step is. Use tools that help you move around (mobility aids) if they are needed. These  include: Canes. Walkers. Scooters. Crutches. Turn on the lights when you go into a dark area. Replace any light bulbs as soon as they burn out. Set up your furniture so you have a clear path. Avoid moving your furniture around. If any of your floors are uneven, fix them. If there are any pets around you, be aware of where they are. Review your medicines with your doctor. Some medicines can make you feel dizzy. This can increase your chance of falling. Ask your doctor what other things that you can do to help prevent falls. This information is not intended to replace advice given to you by your health care provider. Make sure you discuss any questions you have with your health care provider. Document Released: 10/23/2008 Document Revised: 06/04/2015 Document Reviewed: 01/31/2014 Elsevier Interactive Patient Education  2017 [employer].

## 2020-12-18 ENCOUNTER — Other Ambulatory Visit: Payer: Self-pay | Admitting: Internal Medicine

## 2020-12-18 ENCOUNTER — Other Ambulatory Visit: Payer: Self-pay | Admitting: Family Medicine

## 2020-12-28 ENCOUNTER — Encounter: Payer: Self-pay | Admitting: Hematology & Oncology

## 2020-12-28 ENCOUNTER — Encounter: Payer: Self-pay | Admitting: Internal Medicine

## 2020-12-28 ENCOUNTER — Other Ambulatory Visit: Payer: Self-pay | Admitting: Hematology & Oncology

## 2020-12-28 DIAGNOSIS — C6931 Malignant neoplasm of right choroid: Secondary | ICD-10-CM

## 2020-12-29 ENCOUNTER — Other Ambulatory Visit: Payer: Self-pay

## 2020-12-29 DIAGNOSIS — R109 Unspecified abdominal pain: Secondary | ICD-10-CM

## 2020-12-29 DIAGNOSIS — R935 Abnormal findings on diagnostic imaging of other abdominal regions, including retroperitoneum: Secondary | ICD-10-CM

## 2020-12-29 NOTE — Telephone Encounter (Signed)
Patient known to me from upper endoscopy performed in August 2022 to follow-up abnormal imaging in the upper abdomen and concern for possible masslike thickening around the duodenum. EGD report reviewed; study largely unremarkable.  Small hiatal hernia.  Symptoms could certainly be reflux related Please determine if she is taking omeprazole which is on her medication list?  40 mg/day is listed If she is not taking this I would recommend she begin this for this symptom; if she is taking it I would increase to 40 mg twice daily  Given prior abnormal imaging and the symptoms I would recommend repeat CT scan of the abdomen pelvis with contrast --diagnosis is upper abdominal pain but also previous abnormal GI imaging  Please also get her an appointment with me or an APP  Thanks JMP

## 2020-12-30 ENCOUNTER — Other Ambulatory Visit: Payer: Self-pay

## 2020-12-30 MED ORDER — OMEPRAZOLE 40 MG PO CPDR: 40.0000 mg | DELAYED_RELEASE_CAPSULE | Freq: Two times a day (BID) | ORAL | 6 refills | Status: DC

## 2021-01-20 ENCOUNTER — Other Ambulatory Visit: Payer: Self-pay | Admitting: Family Medicine

## 2021-01-20 DIAGNOSIS — E785 Hyperlipidemia, unspecified: Secondary | ICD-10-CM

## 2021-01-25 ENCOUNTER — Telehealth: Payer: Self-pay | Admitting: Internal Medicine

## 2021-01-25 ENCOUNTER — Encounter: Payer: Self-pay | Admitting: Internal Medicine

## 2021-01-25 ENCOUNTER — Other Ambulatory Visit (HOSPITAL_COMMUNITY): Payer: Self-pay

## 2021-01-25 ENCOUNTER — Telehealth: Payer: Self-pay

## 2021-01-25 NOTE — Telephone Encounter (Signed)
Patient Advocate Encounter  Prior Authorization for Modafinil 200mg  tabs has been approved.    PA# [id number]  Effective dates: 01/25/21 through 07/25/21  Per Test Claim Patients co-pay is $7   Spoke with Pharmacy to Process.  Patient Advocate Fax: [phone]

## 2021-01-25 NOTE — Telephone Encounter (Signed)
Patient Advocate Encounter   Received notification from patient calls that prior authorization for Modafinil 200mg  tabs is required by his/her insurance [employer].   PA submitted on 01/25/21  Key#: [id number]  Status is pending    [facility] will continue to follow:  Patient Advocate Fax: [phone]

## 2021-01-26 NOTE — Telephone Encounter (Signed)
Please see mychart message from pt and advise: Carol Quinn  [facility] (supporting Baird Lyons D, MD) 20 hours ago (3:42 PM)   Hello Dr Annamaria Boots,    I hope you are well.  Back on 11/30/20 I reached out to your office about renewing my prescription for Modafinil 200 mg.  My insurance company sent a form to your office also.  I am still trying to get my medication refilled but my insurance company states the they still havent received the prior authorization form from your office.  Can you please help me to get this resolved?  I called your office today and left a message with the front desk.     Thank you, Carol Quinn

## 2021-01-28 ENCOUNTER — Encounter: Payer: Self-pay | Admitting: Internal Medicine

## 2021-01-29 ENCOUNTER — Encounter: Payer: Self-pay | Admitting: Internal Medicine

## 2021-01-29 NOTE — Telephone Encounter (Signed)
Called and spoke with patient to let her know that we need date, juror number, which courthouse, and address for clerk of court. She states that she is not currently at home and will have to call back with that information. Also please ask patient if she wants letter mailed to her or if she wants to pick it up

## 2021-01-29 NOTE — Telephone Encounter (Signed)
[place]- I need to know the date, juror number, which courthouse, and address for clerk of court. I will request permanent excusal.

## 2021-01-29 NOTE — Telephone Encounter (Signed)
Please advise if you are ok with letter for patient   Dr Annamaria Boots,   I have been summoned for jury duty in February.  Due to my emphysema could you please write me a note requesting that I be excused from jury duty because of my illness?  I hate that I have to ask you for this but I do not think it would be in my best interest to sit in close quarters with CoVid and the flu as bad as it is right now.  And I do not think I can walk the distance from the parking lot and up four flights of stairs to be on a jury for days.  Please let me know as soon as possible.  All I need is a note from you that states my illness prevents me from serving on jury duty.    Thank you, Carol Quinn

## 2021-01-29 NOTE — Telephone Encounter (Signed)
Patient is calling to give requested information:  Carol Quinn of Carol Quinn Date: 03/01/21 No Juror number but file number: [phone]  Patient will come by to pick up letter on Monday

## 2021-01-29 NOTE — Telephone Encounter (Signed)
Patient is calling to give requested information:   Carol Quinn of [place] court: [address]   [id number] Date: 03/01/21 No Juror number but file number: [phone]   Patient will come by to pick up letter on Monday

## 2021-02-02 NOTE — Telephone Encounter (Signed)
Letter signed and patient picked up. Nothing further needed.

## 2021-02-10 ENCOUNTER — Other Ambulatory Visit: Payer: Self-pay | Admitting: *Deleted

## 2021-02-10 ENCOUNTER — Other Ambulatory Visit (HOSPITAL_BASED_OUTPATIENT_CLINIC_OR_DEPARTMENT_OTHER): Payer: Self-pay | Admitting: Obstetrics and Gynecology

## 2021-02-10 ENCOUNTER — Telehealth: Payer: Self-pay

## 2021-02-10 DIAGNOSIS — H34232 Retinal artery branch occlusion, left eye: Secondary | ICD-10-CM

## 2021-02-10 DIAGNOSIS — C6931 Malignant neoplasm of right choroid: Secondary | ICD-10-CM

## 2021-02-10 DIAGNOSIS — Z1231 Encounter for screening mammogram for malignant neoplasm of breast: Secondary | ICD-10-CM

## 2021-02-10 NOTE — Telephone Encounter (Signed)
Error

## 2021-02-10 NOTE — Telephone Encounter (Signed)
Plan of Care in [place] for Carol Quinn

## 2021-02-11 ENCOUNTER — Encounter: Payer: Self-pay | Admitting: Hematology & Oncology

## 2021-02-11 ENCOUNTER — Inpatient Hospital Stay: Payer: Medicare Other | Attending: Hematology & Oncology

## 2021-02-11 ENCOUNTER — Encounter (HOSPITAL_BASED_OUTPATIENT_CLINIC_OR_DEPARTMENT_OTHER): Payer: Self-pay

## 2021-02-11 ENCOUNTER — Ambulatory Visit (HOSPITAL_BASED_OUTPATIENT_CLINIC_OR_DEPARTMENT_OTHER)
Admission: RE | Admit: 2021-02-11 | Discharge: 2021-02-11 | Disposition: A | Discharge status: Home / Self Care | Payer: Medicare Other | Source: Ambulatory Visit | Attending: Hematology & Oncology | Admitting: Hematology & Oncology

## 2021-02-11 ENCOUNTER — Other Ambulatory Visit: Payer: Self-pay

## 2021-02-11 ENCOUNTER — Inpatient Hospital Stay (HOSPITAL_BASED_OUTPATIENT_CLINIC_OR_DEPARTMENT_OTHER): Payer: Medicare Other | Admitting: Hematology & Oncology

## 2021-02-11 VITALS — BP 104/54 | HR 87 | Temp 98.1°F | Resp 20 | Ht 62.5 in | Wt 140.0 lb

## 2021-02-11 DIAGNOSIS — C6931 Malignant neoplasm of right choroid: Secondary | ICD-10-CM

## 2021-02-11 DIAGNOSIS — H34232 Retinal artery branch occlusion, left eye: Secondary | ICD-10-CM

## 2021-02-11 LAB — CBC WITH DIFFERENTIAL (CANCER CENTER ONLY)
Abs Immature Granulocytes: 0.01 10*3/uL (ref 0.00–0.07)
Basophils Absolute: 0.1 10*3/uL (ref 0.0–0.1)
Basophils Relative: 2 %
Eosinophils Absolute: 0.4 10*3/uL (ref 0.0–0.5)
Eosinophils Relative: 5 %
HCT: 43.7 % (ref 36.0–46.0)
Hemoglobin: 14.4 g/dL (ref 12.0–15.0)
Immature Granulocytes: 0 %
Lymphocytes Relative: 23 %
Lymphs Abs: 1.9 10*3/uL (ref 0.7–4.0)
MCH: 32.7 pg (ref 26.0–34.0)
MCHC: 33 g/dL (ref 30.0–36.0)
MCV: 99.1 fL (ref 80.0–100.0)
Monocytes Absolute: 0.7 10*3/uL (ref 0.1–1.0)
Monocytes Relative: 9 %
Neutro Abs: 5.1 10*3/uL (ref 1.7–7.7)
Neutrophils Relative %: 61 %
Platelet Count: 295 10*3/uL (ref 150–400)
RBC: 4.41 MIL/uL (ref 3.87–5.11)
RDW: 12.6 % (ref 11.5–15.5)
WBC Count: 8.2 10*3/uL (ref 4.0–10.5)
nRBC: 0 % (ref 0.0–0.2)

## 2021-02-11 LAB — CMP (CANCER CENTER ONLY)
ALT: 32 U/L (ref 0–44)
AST: 23 U/L (ref 15–41)
Albumin: 4.3 g/dL (ref 3.5–5.0)
Alkaline Phosphatase: 97 U/L (ref 38–126)
Anion gap: 9 (ref 5–15)
BUN: 15 mg/dL (ref 8–23)
CO2: 36 mmol/L — ABNORMAL HIGH (ref 22–32)
Calcium: 10.5 mg/dL — ABNORMAL HIGH (ref 8.9–10.3)
Chloride: 101 mmol/L (ref 98–111)
Creatinine: 0.67 mg/dL (ref 0.44–1.00)
GFR, Estimated: >60 mL/min (ref >60)
Glucose, Bld: 108 mg/dL — ABNORMAL HIGH (ref 70–99)
Potassium: 4.4 mmol/L (ref 3.5–5.1)
Sodium: 146 mmol/L — ABNORMAL HIGH (ref 135–145)
Total Bilirubin: 0.5 mg/dL (ref 0.3–1.2)
Total Protein: 6.3 g/dL — ABNORMAL LOW (ref 6.5–8.1)

## 2021-02-11 LAB — LACTATE DEHYDROGENASE: LDH: 140 U/L (ref 98–192)

## 2021-02-11 MED ORDER — VARENICLINE TARTRATE 1 MG PO TABS: 1.0000 mg | ORAL_TABLET | Freq: Two times a day (BID) | ORAL | 2 refills | Status: DC

## 2021-02-11 MED ORDER — IOHEXOL 300 MG/ML  SOLN
100.0000 mL | Freq: Once | INTRAMUSCULAR | Status: AC | PRN
  Administered 2021-02-11: 100 mL via INTRAVENOUS

## 2021-02-11 NOTE — Progress Notes (Signed)
Hematology and Oncology Follow Up Visit  Carol Quinn [id number] January 19, 1955 66 y.o. 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 is here today for follow-up.  We see her every 6 months.  Since we last saw her, she, unfortunately, has begun smoking.  She is that she started back in October.  She is not sure is why she did start.  She is trying to stop.  I will send in another prescription for Chantix.  She did have a CT scan done today.  Everything looked fine.  There is no evidence of any recurrent disease.   She did have an upper endoscopy done back on 08/19/2020.  Everything looks fine with the upper endoscopy.  We did this because her CT scan at that time showed some thickening in the proximal duodenum.  She has had no change in bowel or bladder habits.  There is been no bleeding.  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  Everything is working well with this.  She has had no problems with increased cough.  She does have asthma.  The winter seems to do better for her with the less humidity.  She has had no issues with leg swelling.  She has had no headache.  Overall, her performance status is ECOG 1.    Medications:  Allergies as of 02/11/2021       Reactions   Clarithromycin Diarrhea, Rash, Other (See Comments)   Hydrocodone Bit-homatrop Mbr Other (See Comments)   paranoia   Hydrocodone Bit-homatrop Mbr Other (See Comments)   paranoia   Codeine Itching   Penicillins Other (See Comments)   Has patient had a PCN reaction causing immediate rash, facial/tongue/throat swelling, SOB or lightheadedness with hypotension: Unknown Has patient had a PCN reaction causing severe rash involving mucus membranes or skin necrosis: Unknown Has patient had a PCN reaction that  required hospitalization: Unknown Has patient had a PCN reaction occurring within the last 10 years: No If all of the above answers are "NO", then may proceed with Cephalosporin use.   Topiramate Other (See Comments)   Other reaction(s): WEIGHT LOSS        Medication List        Accurate as of February 11, 2021 10:02 AM. If you have any questions, ask your nurse or doctor.          STOP taking these medications    Chantix Starting Month Pak 0.5 MG X 11 & 1 MG X 42 Tbpk Generic drug: Varenicline Tartrate (Starter) Replaced by: varenicline 1 MG tablet Stopped by: [name], MD       TAKE these medications    albuterol (2.5 MG/3ML) 0.083% nebulizer solution Commonly known as: PROVENTIL Take 3 mLs (2.5 mg total) by nebulization every 6 (six) hours as needed for wheezing or shortness of breath.   atorvastatin 40 MG tablet Commonly known as: LIPITOR TAKE 1 TABLET BY MOUTH DAILY   buPROPion 150 MG 24 hr tablet Commonly known as: WELLBUTRIN XL TAKE 1 TABLET BY MOUTH EVERY MORNING   Epinastine HCl 0.05 % ophthalmic solution Apply to eye.   escitalopram 20 MG tablet Commonly known as: LEXAPRO TAKE 1 TABLET BY MOUTH DAILY   estradiol 0.0375 MG/24HR Commonly known as: VIVELLE-DOT Place onto the skin 2 (two) times a week.  fexofenadine 180 MG tablet Commonly known as: ALLEGRA Take 180 mg by mouth daily.   Fish Oil 1200 MG Caps Take by mouth daily.   fluticasone 50 MCG/ACT nasal spray Commonly known as: FLONASE Place into the nose.   Flutter Devi Use as directed   LYSINE PO Take by mouth daily.   modafinil 200 MG tablet Commonly known as: PROVIGIL TAKE 1 TABLET BY MOUTH DAILY   montelukast 10 MG tablet Commonly known as: SINGULAIR TAKE 1 TABLET BY MOUTH ONCE DAILY AT BEDTIME   naphazoline-pheniramine 0.025-0.3 % ophthalmic solution Commonly known as: NAPHCON-A Place into both eyes.   omeprazole 40 MG capsule Commonly known as: PRILOSEC Take  1 capsule (40 mg total) by mouth 2 (two) times daily before a meal.   PreviDent 5000 Booster Plus 1.1 % Pste Generic drug: Sodium Fluoride   Trelegy Ellipta 100-62.5-25 MCG/ACT Aepb Generic drug: Fluticasone-Umeclidin-Vilant INHALE 1 PUFF INTO THE LUNGS DAILY   valsartan-hydrochlorothiazide 160-25 MG tablet Commonly known as: DIOVAN-HCT TAKE 1 TABLET BY MOUTH DAILY   varenicline 1 MG tablet Commonly known as: Chantix Continuing Month Pak Take 1 tablet (1 mg total) by mouth 2 (two) times daily. Replaces: Chantix Starting Month Pak 0.5 MG X 11 & 1 MG X 42 Tbpk Started by: [name], MD        Allergies:  Allergies  Allergen Reactions   Clarithromycin Diarrhea, Rash and Other (See Comments)   Hydrocodone Bit-Homatrop Mbr Other (See Comments)    paranoia   Hydrocodone Bit-Homatrop Mbr Other (See Comments)    paranoia   Codeine Itching   Penicillins Other (See Comments)     Has patient had a PCN reaction causing immediate rash, facial/tongue/throat swelling, SOB or lightheadedness with hypotension: Unknown Has patient had a PCN reaction causing severe rash involving mucus membranes or skin necrosis: Unknown Has patient had a PCN reaction that required hospitalization: Unknown Has patient had a PCN reaction occurring within the last 10 years: No If all of the above answers are "NO", then may proceed with Cephalosporin use.    Topiramate Other (See Comments)    Other reaction(s): WEIGHT LOSS     Past Medical History, Surgical history, Social history, and Family History were reviewed and updated.  Review of Systems: Review of Systems  Constitutional: Negative.   HENT: Negative.    Eyes: Negative.   Respiratory: Negative.    Cardiovascular: Negative.   Gastrointestinal:  Positive for abdominal pain.  Genitourinary: Negative.   Musculoskeletal: Negative.   Skin: Negative.   Neurological: Negative.   Endo/Heme/Allergies: Negative.   Psychiatric/Behavioral:  Negative.       Physical Exam:  height is 5' 2.5" (1.588 m) and weight is 140 lb (63.5 kg). Her oral temperature is 98.1 F (36.7 C). Her blood pressure is 104/54 (abnormal) and her pulse is 87. Her respiration is 20 and oxygen saturation is 98%.   Wt Readings from Last 3 Encounters:  02/11/21 140 lb (63.5 kg)  11/23/20 143 lb 3.2 oz (65 kg)  10/29/20 145 lb 3.2 oz (65.9 kg)    Physical Exam Vitals reviewed.  HENT:     Head: Normocephalic and atraumatic.  Eyes:     Pupils: Pupils are equal, round, and reactive to light.     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  Left eye is unremarkable.  She has good extraocular muscle movement of the left eye.  Cardiovascular:     Rate and Rhythm: Normal rate and regular  rhythm.     Heart sounds: Normal heart sounds.  Pulmonary:     Effort: Pulmonary effort is normal.     Breath sounds: Normal breath sounds.  Abdominal:     General: Bowel sounds are normal.     Palpations: Abdomen is soft.  Musculoskeletal:        General: No tenderness or deformity. Normal range of motion.     Cervical back: Normal range of motion.  Lymphadenopathy:     Cervical: No cervical adenopathy.  Skin:    General: Skin is warm and dry.     Findings: No erythema or rash.  Neurological:     Mental Status: She is alert and oriented to person, place, and time.  Psychiatric:        Behavior: Behavior normal.        Thought Content: Thought content normal.        Judgment: Judgment normal.     Lab Results  Component Value Date   WBC 8.2 02/11/2021   HGB 14.4 02/11/2021   HCT 43.7 02/11/2021   MCV 99.1 02/11/2021   PLT 295 02/11/2021   No results found for: FERRITIN, IRON, TIBC, UIBC, IRONPCTSAT Lab Results  Component Value Date   RBC 4.41 02/11/2021   No results found for: KPAFRELGTCHN, LAMBDASER, KAPLAMBRATIO No results found for: IGGSERUM, IGA, IGMSERUM No results found for: Kathrynn Ducking, MSPIKE, SPEI   Chemistry      Component Value Date/Time   NA 146 (H) 02/11/2021 0810   NA 143 12/23/2016 0848   K 4.4 02/11/2021 0810   K 4.3 12/23/2016 0848   CL 101 02/11/2021 0810   CL 99 12/23/2016 0848   CO2 36 (H) 02/11/2021 0810   CO2 31 12/23/2016 0848   BUN 15 02/11/2021 0810   BUN 10 12/23/2016 0848   CREATININE 0.67 02/11/2021 0810   CREATININE 1.0 12/23/2016 0848      Component Value Date/Time   CALCIUM 10.5 (H) 02/11/2021 0810   CALCIUM 9.4 12/23/2016 0848   ALKPHOS 97 02/11/2021 0810   ALKPHOS 99 (H) 12/23/2016 0848   AST 23 02/11/2021 0810   ALT 32 02/11/2021 0810   ALT 26 12/23/2016 0848   BILITOT 0.5 02/11/2021 0810      Impression and Plan: Ms. Mellin is a very pleasant 66 yo caucasian female with a choroidal melanoma of the right eye with right eye enucleation in October 2017.   I know it has been almost 6 years since she had the ocular melanoma removed.  We will do a CT scan in 6 months.  If that CT scan looks fine, then I think that over the last time we have to do a CT scan on her.   [name], MD 2/2/202310:02 AM

## 2021-02-17 ENCOUNTER — Other Ambulatory Visit: Payer: Self-pay | Admitting: *Deleted

## 2021-02-17 ENCOUNTER — Encounter: Payer: Self-pay | Admitting: Hematology & Oncology

## 2021-02-17 DIAGNOSIS — Z720 Tobacco use: Secondary | ICD-10-CM

## 2021-02-17 MED ORDER — VARENICLINE TARTRATE 1 MG PO TABS: 1.0000 mg | ORAL_TABLET | Freq: Two times a day (BID) | ORAL | 3 refills | Status: DC

## 2021-03-04 NOTE — Telephone Encounter (Signed)
See other phone note form 1/16 regarding PA approval.

## 2021-03-08 ENCOUNTER — Encounter: Payer: Self-pay | Admitting: Family Medicine

## 2021-03-08 ENCOUNTER — Telehealth: Payer: Self-pay | Admitting: Family Medicine

## 2021-03-08 ENCOUNTER — Ambulatory Visit (INDEPENDENT_AMBULATORY_CARE_PROVIDER_SITE_OTHER): Payer: Medicare Other | Admitting: Family Medicine

## 2021-03-08 VITALS — BP 104/66 | HR 81 | Temp 97.9°F | Resp 16 | Wt 138.0 lb

## 2021-03-08 DIAGNOSIS — J029 Acute pharyngitis, unspecified: Secondary | ICD-10-CM | POA: Diagnosis not present

## 2021-03-08 LAB — POCT RAPID STREP A (OFFICE): Rapid Strep A Screen: NEGATIVE

## 2021-03-08 NOTE — Telephone Encounter (Signed)
nitial Comment Caller states she may have strep throat. Translation No Nurse Assessment Nurse: Adrian Blackwater, RN, Claiborne Billings Date/Time Eilene Ghazi Time): 03/07/2021 8:59:57 AM Confirm and document reason for call. If symptomatic, describe symptoms. ---Caller states she was exposed to someone who had Strep pharyngitis 1.5 wks ago. Yesterday pt woke with a sore throat and HA. Pt does have a cough but she has emphysema. Pt tested neg for Covid yesterday. Does the patient have any new or worsening symptoms? ---Yes Will a triage be completed? ---Yes Related visit to physician within the last 2 weeks? ---No Does the PT have any chronic conditions? (i.e. diabetes, asthma, this includes High risk factors for pregnancy, etc.) ---Yes List chronic conditions. ---HTN, HLD, Emphysema, Hx Melanoma Is this a behavioral health or substance abuse call? ---No Guidelines Guideline Title Affirmed Question Affirmed Notes Nurse Date/Time Eilene Ghazi Time) Strep Throat Exposure SEVERE (e.g., excruciating) throat pain Gigi Gin 03/07/2021 9:02:52 AM Disp. Time Eilene Ghazi Time) Disposition Final User 03/07/2021 9:05:25 AM See PCP within 24 Hours Yes Adrian Blackwater, RN, Claiborne Billings PLEASE NOTE: All timestamps contained within this report are represented as [place] Standard Time. CONFIDENTIALTY NOTICE: This fax transmission is intended only for the addressee. It contains information that is legally privileged, confidential or otherwise protected from use or disclosure. If you are not the intended recipient, you are strictly prohibited from reviewing, disclosing, copying using or disseminating any of this information or taking any action in reliance on or regarding this information. If you have received this fax in error, please notify us immediately by telephone so that we can arrange for its return to [place]. Phone: [phone], Toll-Free: [phone], Fax: [phone] Page: 2 of 2 Call Id: [id number] [place] Disagree/Comply  Comply Caller Understands Yes PreDisposition Call Doctor Care Advice Given Per Guideline SEE PCP WITHIN 24 HOURS: * IF OFFICE WILL BE OPEN: You need to be examined within the next 24 hours. Call your doctor (or NP/PA) when the office opens and make an appointment. SORE THROAT: * Gargle with warm salt water four times a day. To make salt water, put 1/2 teaspoon of salt in 8 oz (240 ml) of warm water. * Sip warm chicken broth or apple juice. * Avoid cigarette smoke. CALL BACK IF: * You become worse Comments User: Delana Meyer, RN Date/Time Eilene Ghazi Time): 03/07/2021 9:03:44 AM Current throat pain 8/10 Referrals GO TO FACILITY UNDECIDED   Pt is scheduled to see Tabori today

## 2021-03-08 NOTE — Patient Instructions (Signed)
Follow up as needed or as scheduled Thankfully you do not have strep! I suspect this is the start of a viral illness and will improve w/ time Continue to gargle w/ salt water for pain relief You can also use ibuprofen for pain/inflammation of throat Drink LOTS of fluids REST! Call with any questions or concerns- especially if symptoms change or worsen Hang in there!!!

## 2021-03-08 NOTE — Progress Notes (Signed)
° °  Subjective:    Patient ID: Carol Quinn, female    DOB: December 06, 1955, 66 y.o.   MRN: [id number]  HPI Sore throat- pt woke up Saturday w/ pain on swallowing.  'it feels like there's a lump in there'.  10 days ago was exposed to strep.  Pt reports she woke up sweating last week but did not think of possible fever.  + HA.  No nausea but did have loose stools x2 yesterday.  No abd pain.  Denies cough above baseline.  COVID (-) x2.   Review of Systems For ROS see HPI   This visit occurred during the SARS-CoV-2 public health emergency.  Safety protocols were in place, including screening questions prior to the visit, additional usage of staff PPE, and extensive cleaning of exam room while observing appropriate contact time as indicated for disinfecting solutions.      Objective:   Physical Exam Vitals reviewed.  Constitutional:      General: She is not in acute distress.    Appearance: She is well-developed. She is not ill-appearing.  HENT:     Head: Normocephalic and atraumatic.     Right Ear: Tympanic membrane and ear canal normal. No middle ear effusion.     Left Ear: Tympanic membrane and ear canal normal.  No middle ear effusion.     Nose: No congestion or rhinorrhea.     Mouth/Throat:     Mouth: Mucous membranes are moist.     Pharynx: Oropharynx is clear. Uvula midline. No oropharyngeal exudate, posterior oropharyngeal erythema or uvula swelling.     Tonsils: No tonsillar exudate.  Musculoskeletal:     Cervical back: Normal range of motion and neck supple.  Lymphadenopathy:     Cervical: No cervical adenopathy.  Skin:    General: Skin is warm and dry.  Neurological:     General: No focal deficit present.     Mental Status: She is alert and oriented to person, place, and time.  Psychiatric:        Mood and Affect: Mood normal.        Behavior: Behavior normal.          Assessment & Plan:  Sore throat- new.  Pt's throat appears normal and rapid strep was negative.   Suspect this is a viral illness as she had loose stools yesterday.  Reviewed that often a sore throat can proceed other viral symptoms.  Encouraged fluids, rest, hydration, NSAIDS.  Reviewed supportive care and red flags that should prompt return.  Pt expressed understanding and is in agreement w/ plan.

## 2021-03-08 NOTE — Telephone Encounter (Signed)
Patient seen today in clinic.

## 2021-03-15 ENCOUNTER — Ambulatory Visit (HOSPITAL_BASED_OUTPATIENT_CLINIC_OR_DEPARTMENT_OTHER)
Admission: RE | Admit: 2021-03-15 | Discharge: 2021-03-15 | Disposition: A | Discharge status: Home / Self Care | Payer: Medicare Other | Source: Ambulatory Visit | Attending: Obstetrics and Gynecology | Admitting: Obstetrics and Gynecology

## 2021-03-15 ENCOUNTER — Encounter (HOSPITAL_BASED_OUTPATIENT_CLINIC_OR_DEPARTMENT_OTHER): Payer: Self-pay

## 2021-03-15 ENCOUNTER — Other Ambulatory Visit: Payer: Self-pay

## 2021-03-15 DIAGNOSIS — Z1231 Encounter for screening mammogram for malignant neoplasm of breast: Secondary | ICD-10-CM

## 2021-04-02 NOTE — Telephone Encounter (Signed)
Error

## 2021-04-11 ENCOUNTER — Encounter: Payer: Self-pay | Admitting: Family Medicine

## 2021-04-12 ENCOUNTER — Encounter: Payer: Self-pay | Admitting: Family Medicine

## 2021-04-12 DIAGNOSIS — L570 Actinic keratosis: Secondary | ICD-10-CM

## 2021-04-21 ENCOUNTER — Other Ambulatory Visit: Payer: Self-pay | Admitting: Family Medicine

## 2021-04-21 ENCOUNTER — Other Ambulatory Visit: Payer: Self-pay | Admitting: Internal Medicine

## 2021-04-21 DIAGNOSIS — E785 Hyperlipidemia, unspecified: Secondary | ICD-10-CM

## 2021-04-28 NOTE — Progress Notes (Signed)
HPI ? 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 FEV1 1.21/48% (+32%), FEV1/FVC 0.51, TLC 95%, DLCO 35% ?Walk Test Room Air 03/05/2015-desaturated to 88% ? ?-------------------------------------------------------------------------------------- ? ? 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 LUL density ?------Follow up on COPD. Needs insurance form filled out.  ?She started smoking again but thinks with Chantix from Dr Birdie Riddle she can quit. Declines referral to pharmacy telephone smoking program.  ?Sleeps with O2 and uses POC when she can. Afraid POC might draw in viruses- discussed.  ?Lingering throat irritation with partial clearing after nystatin. She had had prednisone and Trelegy. If we can't clear it then consider change.Some may be from smoking. ?Reviewed CT images and report with her. Will get f/u CT chest. ?CT chest/abd/pelvis 08/12/20- ?IMPRESSION: ?1. Interval development of extensive mural thickening in the second ?portion of the duodenum which is somewhat asymmetric and mass-like ?in appearance. Further evaluation with nonemergent endoscopy is ?suggested to exclude the possibility of a metastatic lesion in the ?proximal small bowel. ?2. In  addition, there is what appears to be an area of resolving ?airspace consolidation in the posterior aspect of the left upper ?lobe. This is slightly nodular mass-like in appearance, but strongly ?favored to be benign. Short-term repeat noncontrast chest CT is ?recommended in 2 months to ensure the resolution of this finding. ?3. Diffuse bronchial wall thickening with severe centrilobular and ?paraseptal emphysema; imaging findings suggestive of underlying ?COPD. ?4. Aortic atherosclerosis, in addition to 2 vessel coronary artery ?disease. Please note that although the presence of coronary artery ?calcium documents the presence of coronary artery disease, the ?severity of this disease and any potential stenosis cannot be ?assessed on this non-gated CT examination. Assessment for potential ?risk factor modification, dietary therapy or pharmacologic therapy ?may be warranted, if clinically indicated. ?5. Mild colonic diverticulosis without evidence of acute ?diverticulitis at this time. ?6. Additional incidental findings, as above. ? 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 Patient [facility]. Feels like breathing is worse since last visit. Productive cough with yellow/brown sputum. Worse last couple months and in the morning  ?She continues to sleep with oxygen at night.  Emphasis today on getting off of cigarettes again.   ?She continues long-term surveillance by oncology for her choroid melanoma with no recurrence or  metastasis so far. ?We discussed active chronic bronchitis symptoms and her recent chest CT. ?CT chest/abd/pelvis 02/11/21- ?IMPRESSION: ?1. No evidence of metastatic disease in the chest, abdomen or ?pelvis. ?2. Coronary artery calcifications, aortic  Atherosclerosis ?(ICD10-I70.0) and Emphysema (ICD10-J43.9). ? ? ?ROS-see HPI   + = positive ?Constitutional:   No-   weight loss, night sweats, fevers, chills, +fatigue, lassitude. ?HEENT:   No-  headaches, difficulty swallowing, tooth/dental problems, sore throat,  ?     No-  sneezing, itching, ear ache, +nasal congestion, +post nasal drip,  ?CV:  No-   chest pain, orthopnea, PND, swelling in lower extremities, anasarca, dizziness, palpitations ?Resp: + shortness of breath with exertion or at rest.   ?             +productive cough,  + non-productive cough,  No- coughing up of blood.   ?           +change in color of mucus.  + wheezing.   ?Skin: No-   rash or lesions. ?GI:  No-   heartburn, indigestion, abdominal pain, nausea, vomiting,  ?GU:  ?MS:  No-   joint pain or swelling.   ?Neuro-     nothing unusual ?Psych:  No- change in mood or affect. No depression or anxiety.  No memory loss. ? ?OBJ- Physical Exam     ?General- Alert, Oriented, Affect-appropriate,  + overweight ?Skin- clear  ?Lymphadenopathy- none ?Head- atraumatic ?           Eyes- + R prosthesis ?           Ears- Hearing, canals-normal ?           Nose- clear, no-Septal dev, mucus, polyps, erosion, perforation  ?           Throat- Mallampati IV , mucosa clear/  drainage- none, tonsils- small residual ?Neck- flexible , trachea midline, no stridor , thyroid nl, carotid no bruit ?Chest - symmetrical excursion , unlabored ?          Heart/CV- RRR , no murmur , no gallop  , no rub, nl s1 s2 ?                          - JVD- none , edema- none, stasis changes- none, varices- none ?          Lung-  +distant/ clear, Wheeze- none, cough-none , dullness-none, rub- none,   ?          Chest wall-  ?Abd-  ?Br/ Gen/  Rectal- Not done, not indicated ?Extrem- cyanosis- none, clubbing, none, atrophy- none, strength- nl ?Neuro- grossly intact to observation ? ? ? ? ? ?

## 2021-04-29 ENCOUNTER — Encounter: Payer: Self-pay | Admitting: Internal Medicine

## 2021-04-29 ENCOUNTER — Ambulatory Visit (INDEPENDENT_AMBULATORY_CARE_PROVIDER_SITE_OTHER): Payer: Medicare Other | Admitting: Internal Medicine

## 2021-04-29 DIAGNOSIS — Z720 Tobacco use: Secondary | ICD-10-CM | POA: Diagnosis not present

## 2021-04-29 DIAGNOSIS — J449 Chronic obstructive pulmonary disease, unspecified: Secondary | ICD-10-CM | POA: Diagnosis not present

## 2021-04-29 DIAGNOSIS — J9611 Chronic respiratory failure with hypoxia: Secondary | ICD-10-CM

## 2021-04-29 MED ORDER — DOXYCYCLINE HYCLATE 100 MG PO TABS: 100.0000 mg | ORAL_TABLET | Freq: Two times a day (BID) | ORAL | 0 refills | Status: DC

## 2021-04-29 MED ORDER — VARENICLINE TARTRATE 1 MG PO TABS: 1.0000 mg | ORAL_TABLET | Freq: Two times a day (BID) | ORAL | 12 refills | Status: DC

## 2021-04-29 NOTE — Patient Instructions (Signed)
[facility] at [facility] ? ?Doxycycline script sent to [facility] ? ?Please try not to smoke. I hope this summer is good for you. Please call if we can help. ?

## 2021-05-04 ENCOUNTER — Other Ambulatory Visit: Payer: Self-pay | Admitting: Family Medicine

## 2021-05-04 ENCOUNTER — Encounter: Payer: Self-pay | Admitting: Family Medicine

## 2021-05-04 ENCOUNTER — Other Ambulatory Visit: Payer: Self-pay

## 2021-05-04 MED ORDER — VALSARTAN-HYDROCHLOROTHIAZIDE 160-25 MG PO TABS: 1.0000 | ORAL_TABLET | Freq: Every day | ORAL | 1 refills | Status: DC

## 2021-05-09 DIAGNOSIS — I729 Aneurysm of unspecified site: Secondary | ICD-10-CM | POA: Insufficient documentation

## 2021-05-09 DIAGNOSIS — H3412 Central retinal artery occlusion, left eye: Secondary | ICD-10-CM | POA: Insufficient documentation

## 2021-05-09 NOTE — Progress Notes (Signed)
?  ?Cardiology Office Note ? ? ?Date:  05/10/2021  ? ?ID:  Carol Quinn, DOB February 08, 1955, MRN [id number] ? ?PCP:  Midge Minium, MD  ?Cardiologist:   Minus Breeding, MD  ?Referring:  Midge Minium, MD ? ? ?Chief Complaint  ?Patient presents with  ? Palpitations  ? ? ?  ?History of Present Illness: ?Carol Quinn is a 66 y.o. female who is referred by Midge Minium, MD for evaluation of retinal artery occlusion..  She had chest pain in 2016 and she had a negative Lexiscan Myoview.  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  She is now lost 25% vision in her left eye.   ? ?Since I last saw her she has had no new cardiac complaints.  She has maintained the patient can still developing she needs to do despite the loss.  She is not having any chest pressure, neck or arm discomfort.  She has no new palpitations, presyncope or syncope.  She has no weight gain or edema. ? ? ?Past Medical History:  ?Diagnosis Date  ? Acute bronchitis   ? Allergic rhinitis   ? Allergy   ? Anxiety   ? Aortic atherosclerosis ([place])   ? Cancer [facility])   ? Chronic headaches   ? COPD (chronic obstructive pulmonary disease) ([place])   ? emphysema  ? Depression   ? Emphysema of lung ([place])   ? Emphysema, unspecified ([place])   ? Exposure to TB   ? uncle-her PPD neg  ? GERD (gastroesophageal reflux disease)   ? Hepatic cyst   ? left  ? Hyperlipidemia   ? Hypertension   ? MDS (myelodysplastic syndrome), low grade ([place]) 09/23/2016  ? Ocular melanoma, right ([place])   ? has prosthesis  ? Oxygen deficiency   ? RBBB (right bundle branch block with left anterior fascicular block)   ? ? ?Past Surgical History:  ?Procedure Laterality Date  ? BREAST BIOPSY Left 2002  ? COLONOSCOPY WITH PROPOFOL N/A 03/21/2017  ? Procedure: COLONOSCOPY WITH PROPOFOL;  Surgeon: Jerene Bears, MD;  Location: [facility];  Service: Gastroenterology;  Laterality: N/A;  ? NOSE SURGERY    ? "nasal deviation"  ? TOTAL ABDOMINAL HYSTERECTOMY    ? TRIGGER  FINGER RELEASE Right 06/05/2018  ? Procedure: RIGHT THUMB RELEASE TRIGGER FINGER/A-1 PULLEY;  Surgeon: Daryll Brod, MD;  Location: [facility];  Service: Orthopedics;  Laterality: Right;  ? TUBAL LIGATION    ? ? ? ?Current Outpatient Medications  ?Medication Sig Dispense Refill  ? albuterol (PROVENTIL) (2.5 MG/3ML) 0.083% nebulizer solution Take 3 mLs (2.5 mg total) by nebulization every 6 (six) hours as needed for wheezing or shortness of breath. 150 mL 11  ? aspirin EC 81 MG tablet Take 81 mg by mouth daily. Swallow whole.    ? atorvastatin (LIPITOR) 40 MG tablet Take 1 tablet (40 mg total) by mouth daily. 90 tablet 3  ? buPROPion (WELLBUTRIN XL) 150 MG 24 hr tablet TAKE 1 TABLET BY MOUTH EVERY MORNING 90 tablet 1  ? Epinastine HCl 0.05 % ophthalmic solution Apply to eye.    ? escitalopram (LEXAPRO) 20 MG tablet TAKE 1 TABLET BY MOUTH DAILY 30 tablet 6  ? estradiol (VIVELLE-DOT) 0.0375 MG/24HR Place onto the skin 2 (two) times a week.    ? fexofenadine (ALLEGRA) 180 MG tablet Take 180 mg by mouth daily.    ? fluticasone (FLONASE) 50 MCG/ACT nasal spray Place into the nose.    ?  LYSINE PO Take by mouth daily.    ? modafinil (PROVIGIL) 200 MG tablet TAKE 1 TABLET BY MOUTH DAILY 30 tablet 5  ? montelukast (SINGULAIR) 10 MG tablet TAKE 1 TABLET BY MOUTH ONCE DAILY AT BEDTIME 30 tablet 3  ? naphazoline-pheniramine (NAPHCON-A) 0.025-0.3 % ophthalmic solution Place into both eyes.    ? Omega-3 Fatty Acids (FISH OIL) 1200 MG CAPS Take by mouth daily.    ? omeprazole (PRILOSEC) 40 MG capsule Take 1 capsule (40 mg total) by mouth 2 (two) times daily before a meal. 60 capsule 6  ? PREVIDENT 5000 BOOSTER PLUS 1.1 % PSTE     ? Respiratory Therapy Supplies (FLUTTER) DEVI Use as directed 1 each 0  ? TRELEGY ELLIPTA 100-62.5-25 MCG/INH AEPB INHALE 1 PUFF INTO THE LUNGS DAILY 60 each 11  ? valsartan-hydrochlorothiazide (DIOVAN-HCT) 160-25 MG tablet Take 1 tablet by mouth daily. 90 tablet 3  ? varenicline (CHANTIX  CONTINUING MONTH PAK) 1 MG tablet Take 1 tablet (1 mg total) by mouth 2 (two) times daily. 60 tablet 12  ? doxycycline (VIBRA-TABS) 100 MG tablet Take 1 tablet (100 mg total) by mouth 2 (two) times daily. (Patient not taking: Reported on 05/10/2021) 14 tablet 0  ? ?Current Facility-Administered Medications  ?Medication Dose Route Frequency Provider Last Rate Last Admin  ? 0.9 %  sodium chloride infusion  500 mL Intravenous Once Pyrtle, Lajuan Lines, MD      ? ? ?Allergies:   Clarithromycin, Hydrocodone bit-homatrop mbr, Hydrocodone bit-homatrop mbr, Codeine, Penicillins, and Topiramate  ? ? ?ROS:  Please see the history of present illness.   Otherwise, review of systems are positive for none.   All other systems are reviewed and negative.  ? ? ?PHYSICAL EXAM: ?VS:  BP (!) 105/59   Pulse 76   Ht '5\' 2"'$  (1.575 m)   Wt 137 lb (62.1 kg)   SpO2 97%   BMI 25.06 kg/m?  , BMI Body mass index is 25.06 kg/m?. ?GENERAL:  Well appearing ?NECK:  No jugular venous distention, waveform within normal limits, carotid upstroke brisk and symmetric, no bruits, no thyromegaly ?LUNGS:  Clear to auscultation bilaterally ?CHEST:  Unremarkable ?HEART:  PMI not displaced or sustained,S1 and S2 within normal limits, no S3, no S4, no clicks, no rubs, no murmurs ?ABD:  Flat, positive bowel sounds normal in frequency in pitch, no bruits, no rebound, no guarding, no midline pulsatile mass, no hepatomegaly, no splenomegaly ?EXT:  2 plus pulses throughout, no edema, no cyanosis no clubbing ? ? ?EKG:  EKG is  ordered today. ?The ekg ordered today demonstrates sinus rhythm, rate 76, right bundle branch block (old), no acute ST-T wave changes. ? ? ?Recent Labs: ?11/23/2020: TSH 3.09 ?02/11/2021: ALT 32; BUN 15; Creatinine 0.67; Hemoglobin 14.4; Platelet Count 295; Potassium 4.4; Sodium 146  ? ? ?Lipid Panel ?   ?Component Value Date/Time  ? CHOL 145 11/23/2020 1317  ? TRIG 121.0 11/23/2020 1317  ? HDL 58.30 11/23/2020 1317  ? CHOLHDL 2 11/23/2020 1317  ?  VLDL 24.2 11/23/2020 1317  ? [place] 63 11/23/2020 1317  ? LDLDIRECT 126.9 12/21/2012 1148  ? ?  ? ?Wt Readings from Last 3 Encounters:  ?05/10/21 137 lb (62.1 kg)  ?04/29/21 135 lb 12.8 oz (61.6 kg)  ?03/08/21 138 lb (62.6 kg)  ?  ? ? ?Other studies Reviewed: ?Additional studies/ records that were reviewed today include: Neurology notes, labs ?Review of the above records demonstrates:  Please see elsewhere in the note.   ? ? ?  ASSESSMENT AND PLAN: ? ?LEFT RETINAL ARTERY OCCLUSION:     There was never any clear etiology.  There were no arrhythmias.  She does not feel palpitations very often from her Watch will occasionally report high heart rates.  She can download and send those to me.  Otherwise I looked through her neurology notes and I suggested discontinuing aspirin.  ? ?LEFT MCA ANEURYSM:     I reminded her that she needs a follow-up MRI per neurology in December and she will call and schedule when arrange follow-up.  She was not clear on this follow-up and those notes in detail to catch her up.  The last MAP was in 2021 it was stable. ? ?DYSLIPIDEMIA:    The last LDL was 63 with an HDL of 58.3.  No change in therapy.  ? ?HTN:   The blood pressure is well controlled.  She will continue the meds as listed.  ? ?Current medicines are reviewed at length with the patient today.  The patient does not have concerns regarding medicines. ? ?The following changes have been made:  None ? ?Labs/ tests ordered today include:   None ? ?Orders Placed This Encounter  ?Procedures  ? EKG 12-Lead  ? ? ? ?Disposition:   FU with me as needed.   ? ?Signed, ?Minus Breeding, MD  ?05/10/2021 1:07 PM    ?[place] ? ? ?

## 2021-05-10 ENCOUNTER — Ambulatory Visit (INDEPENDENT_AMBULATORY_CARE_PROVIDER_SITE_OTHER): Payer: Medicare Other | Admitting: Cardiology

## 2021-05-10 ENCOUNTER — Encounter: Payer: Self-pay | Admitting: Cardiology

## 2021-05-10 VITALS — BP 105/59 | HR 76 | Ht 62.0 in | Wt 137.0 lb

## 2021-05-10 DIAGNOSIS — I10 Essential (primary) hypertension: Secondary | ICD-10-CM | POA: Diagnosis not present

## 2021-05-10 DIAGNOSIS — I729 Aneurysm of unspecified site: Secondary | ICD-10-CM

## 2021-05-10 DIAGNOSIS — H3412 Central retinal artery occlusion, left eye: Secondary | ICD-10-CM | POA: Diagnosis not present

## 2021-05-10 DIAGNOSIS — E785 Hyperlipidemia, unspecified: Secondary | ICD-10-CM | POA: Diagnosis not present

## 2021-05-10 MED ORDER — ATORVASTATIN CALCIUM 40 MG PO TABS: 40.0000 mg | ORAL_TABLET | Freq: Every day | ORAL | 3 refills | Status: DC

## 2021-05-10 MED ORDER — VALSARTAN-HYDROCHLOROTHIAZIDE 160-25 MG PO TABS: 1.0000 | ORAL_TABLET | Freq: Every day | ORAL | 3 refills | Status: DC

## 2021-05-10 NOTE — Patient Instructions (Addendum)
?  Follow-Up: ?At [facility], you and your health needs are our priority.  As part of our continuing mission to provide you with exceptional heart care, we have created designated Provider Care Teams.  These Care Teams include your primary Cardiologist (physician) and Advanced Practice Providers (APPs -  Physician Assistants and Nurse Practitioners) who all work together to provide you with the care you need, when you need it. ? ?We recommend signing up for the patient portal called "MyChart".  Sign up information is provided on this After Visit Summary.  MyChart is used to connect with patients for Virtual Visits (Telemedicine).  Patients are able to view lab/test results, encounter notes, upcoming appointments, etc.  Non-urgent messages can be sent to your provider as well.   ?To learn more about what you can do with MyChart, go to [email or web address].   ? ?Your next appointment:   ? ? ?As needed ? ?Important Information About Sugar ? ? ? ? ?  ? ? ? ? ?Important Information About Sugar ? ? ? ? ?  ?

## 2021-05-18 ENCOUNTER — Encounter: Payer: Self-pay | Admitting: *Deleted

## 2021-05-18 DIAGNOSIS — E785 Hyperlipidemia, unspecified: Secondary | ICD-10-CM

## 2021-05-18 MED ORDER — ATORVASTATIN CALCIUM 40 MG PO TABS: 40.0000 mg | ORAL_TABLET | Freq: Every day | ORAL | 3 refills | Status: DC

## 2021-05-18 MED ORDER — VALSARTAN-HYDROCHLOROTHIAZIDE 160-25 MG PO TABS: 1.0000 | ORAL_TABLET | Freq: Every day | ORAL | 3 refills | Status: DC

## 2021-05-19 ENCOUNTER — Encounter: Payer: Self-pay | Admitting: Internal Medicine

## 2021-05-19 NOTE — Assessment & Plan Note (Signed)
Guidance and support again offered. ?Plan-Chantix prescription sent ?

## 2021-05-19 NOTE — Assessment & Plan Note (Signed)
She continues to need oxygen for sleep and as needed use with no change. ?

## 2021-05-19 NOTE — Assessment & Plan Note (Signed)
Trelegy, rescue inhaler and her nebulizer machine work well ?Plan-refills as needed ?

## 2021-05-24 ENCOUNTER — Ambulatory Visit: Payer: Medicare Other | Admitting: Family Medicine

## 2021-05-26 ENCOUNTER — Encounter: Payer: Self-pay | Admitting: Family Medicine

## 2021-05-26 ENCOUNTER — Ambulatory Visit (INDEPENDENT_AMBULATORY_CARE_PROVIDER_SITE_OTHER): Payer: Medicare Other | Admitting: Family Medicine

## 2021-05-26 VITALS — BP 108/60 | HR 61 | Temp 97.9°F | Resp 16 | Ht 62.0 in | Wt 136.6 lb

## 2021-05-26 DIAGNOSIS — R252 Cramp and spasm: Secondary | ICD-10-CM | POA: Diagnosis not present

## 2021-05-26 DIAGNOSIS — R42 Dizziness and giddiness: Secondary | ICD-10-CM

## 2021-05-26 DIAGNOSIS — I10 Essential (primary) hypertension: Secondary | ICD-10-CM

## 2021-05-26 DIAGNOSIS — E785 Hyperlipidemia, unspecified: Secondary | ICD-10-CM | POA: Diagnosis not present

## 2021-05-26 LAB — CBC WITH DIFFERENTIAL/PLATELET
Basophils Absolute: 0 K/uL (ref 0.0–0.1)
Basophils Relative: 0.7 % (ref 0.0–3.0)
Eosinophils Absolute: 0.2 K/uL (ref 0.0–0.7)
Eosinophils Relative: 3.5 % (ref 0.0–5.0)
HCT: 42.3 % (ref 36.0–46.0)
Hemoglobin: 14.1 g/dL (ref 12.0–15.0)
Lymphocytes Relative: 22.4 % (ref 12.0–46.0)
Lymphs Abs: 1.6 K/uL (ref 0.7–4.0)
MCHC: 33.4 g/dL (ref 30.0–36.0)
MCV: 97.7 fl (ref 78.0–100.0)
Monocytes Absolute: 0.6 K/uL (ref 0.1–1.0)
Monocytes Relative: 8.7 % (ref 3.0–12.0)
Neutro Abs: 4.5 K/uL (ref 1.4–7.7)
Neutrophils Relative %: 64.7 % (ref 43.0–77.0)
Platelets: 308 K/uL (ref 150.0–400.0)
RBC: 4.33 Mil/uL (ref 3.87–5.11)
RDW: 12.4 % (ref 11.5–15.5)
WBC: 7 K/uL (ref 4.0–10.5)

## 2021-05-26 LAB — BASIC METABOLIC PANEL
BUN: 13 mg/dL (ref 6–23)
CO2: 34 mEq/L — ABNORMAL HIGH (ref 19–32)
Calcium: 9.3 mg/dL (ref 8.4–10.5)
Chloride: 98 mEq/L (ref 96–112)
Creatinine, Ser: 0.55 mg/dL (ref 0.40–1.20)
GFR: 96.88 mL/min (ref >60.00)
Glucose, Bld: 101 mg/dL — ABNORMAL HIGH (ref 70–99)
Potassium: 3.6 mEq/L (ref 3.5–5.1)
Sodium: 139 mEq/L (ref 135–145)

## 2021-05-26 LAB — HEPATIC FUNCTION PANEL
ALT: 28 U/L (ref 0–35)
AST: 18 U/L (ref 0–37)
Albumin: 4.1 g/dL (ref 3.5–5.2)
Alkaline Phosphatase: 82 U/L (ref 39–117)
Bilirubin, Direct: 0.1 mg/dL (ref 0.0–0.3)
Total Bilirubin: 0.5 mg/dL (ref 0.2–1.2)
Total Protein: 6 g/dL (ref 6.0–8.3)

## 2021-05-26 LAB — LIPID PANEL
Cholesterol: 142 mg/dL (ref 0–200)
HDL: 57.7 mg/dL (ref >39.00)
LDL Cholesterol: 62 mg/dL (ref 0–99)
NonHDL: 84.74
Total CHOL/HDL Ratio: 2
Triglycerides: 116 mg/dL (ref 0.0–149.0)
VLDL: 23.2 mg/dL (ref 0.0–40.0)

## 2021-05-26 LAB — TSH: TSH: 2.81 u[IU]/mL (ref 0.35–5.50)

## 2021-05-26 LAB — MAGNESIUM: Magnesium: 1.6 mg/dL (ref 1.5–2.5)

## 2021-05-26 MED ORDER — VALSARTAN-HYDROCHLOROTHIAZIDE 80-12.5 MG PO TABS: 1.0000 | ORAL_TABLET | Freq: Every day | ORAL | 1 refills | Status: DC

## 2021-05-26 NOTE — Patient Instructions (Signed)
Follow up in 6 weeks to recheck BP ?We'll notify you of your lab results and make any changes if needed ?DECREASE the Valsartan HCTZ to 1/2 tab (80/12.'5mg'$  daily) and I'll send in a new prescription for the lower dose (at that point you will take 1 tab) ?Drink LOTS of water ?Change positions slowly ?Call with any questions or concerns ?Hang in there!! ?

## 2021-05-26 NOTE — Progress Notes (Signed)
   Subjective:    Patient ID: Carol Quinn, female    DOB: 21-Jan-1955, 66 y.o.   MRN: [id number]  HPI HTN- chronic problem, on Valsartan HCTZ 160/25.  BP today is 108/60.  On 5/1 was 105/59.  She states she has been having issues w/ dizziness.  Pt says on Saturday she got out of the car and was half way across the parking lot before she got 'super dizzy'.  Pt states this is occurring frequently.  + leg cramps.  No CP, SOB above baseline, HAs, visual changes, edema.  Hyperlipidemia- chronic problem, on Lipitor '40mg'$  daily.  Denies abd pain, N/V.   Review of Systems For ROS see HPI     Objective:   Physical Exam Vitals reviewed.  Constitutional:      General: She is not in acute distress.    Appearance: Normal appearance. She is well-developed. She is not ill-appearing.  HENT:     Head: Normocephalic and atraumatic.  Eyes:     Conjunctiva/sclera: Conjunctivae normal.     Pupils: Pupils are equal, round, and reactive to light.  Neck:     Thyroid: No thyromegaly.  Cardiovascular:     Rate and Rhythm: Normal rate and regular rhythm.     Pulses: Normal pulses.     Heart sounds: Normal heart sounds. No murmur heard. Pulmonary:     Effort: Pulmonary effort is normal. No respiratory distress.     Breath sounds: Normal breath sounds.  Abdominal:     General: There is no distension.     Palpations: Abdomen is soft.     Tenderness: There is no abdominal tenderness.  Musculoskeletal:     Cervical back: Normal range of motion and neck supple.     Right lower leg: No edema.     Left lower leg: No edema.  Lymphadenopathy:     Cervical: No cervical adenopathy.  Skin:    General: Skin is warm and dry.  Neurological:     Mental Status: She is alert and oriented to person, place, and time.  Psychiatric:        Behavior: Behavior normal.          Assessment & Plan:  Leg cramps- will check labs to r/o metabolic cause.  Encouraged increased water intake.  Will decrease HCTZ to  12.'5mg'$  daily which should help.  Talked about benefits of tonic water and mustard.  Will follow.  Dizziness- new.  Likely due to overcontrolled BP.  Decrease Valsartan HCTZ and monitor for improvement.  Pt expressed understanding and is in agreement w/ plan.

## 2021-05-27 ENCOUNTER — Telehealth: Payer: Self-pay

## 2021-05-27 NOTE — Telephone Encounter (Signed)
-----   Message from Midge Minium, MD sent at 05/26/2021  8:55 PM EDT ----- Labs look good!  No changes at this time

## 2021-05-27 NOTE — Telephone Encounter (Signed)
Spoke w/ pt . Advised of lab results

## 2021-05-28 ENCOUNTER — Encounter: Payer: Self-pay | Admitting: Internal Medicine

## 2021-05-31 MED ORDER — DOXYCYCLINE HYCLATE 100 MG PO TABS: 100.0000 mg | ORAL_TABLET | Freq: Two times a day (BID) | ORAL | 0 refills | Status: DC

## 2021-05-31 NOTE — Telephone Encounter (Signed)
Message was sent to [facility] Friday 5/19. Since no response and with CY being out, sending to provider of the day. Dr. Valeta Harms, please see mychart message as well as message from April and advise.

## 2021-06-07 NOTE — Assessment & Plan Note (Signed)
Chronic problem.  Tolerating Lipitor 40mg daily w/o difficulty.  Check labs.  Adjust meds prn  

## 2021-06-07 NOTE — Assessment & Plan Note (Signed)
Chronic problem.  Over-controlled on Valsartan HCTZ 160/'25mg'$  daily.  BP is low and likely dropping lower w/ position changes.  Will decrease dose to 80/12.'5mg'$  daily and monitor closely for both BP control and resolution of dizziness.  Pt expressed understanding and is in agreement w/ plan.

## 2021-07-07 ENCOUNTER — Encounter: Payer: Self-pay | Admitting: Family Medicine

## 2021-07-07 ENCOUNTER — Ambulatory Visit (INDEPENDENT_AMBULATORY_CARE_PROVIDER_SITE_OTHER): Payer: Medicare Other | Admitting: Family Medicine

## 2021-07-07 ENCOUNTER — Encounter: Payer: Self-pay | Admitting: Internal Medicine

## 2021-07-07 VITALS — BP 110/80 | HR 74 | Temp 97.1°F | Resp 16 | Ht 62.0 in | Wt 133.2 lb

## 2021-07-07 DIAGNOSIS — I10 Essential (primary) hypertension: Secondary | ICD-10-CM

## 2021-07-07 NOTE — Assessment & Plan Note (Signed)
BP remains well controlled after decreasing Valsartan HCTZ to 80/12.'5mg'$  daily.  She reports leg cramps and dizziness have improved since decreasing dose.  Currently undergoing work up for an episode in which she lost her vision.  Has seen Ophthalmology and Cardiology.  Will follow along.

## 2021-07-07 NOTE — Patient Instructions (Signed)
Schedule your complete physical in November No need for labs today- yay!!! Your blood pressure looks great and I'm so glad that the symptoms are better Continue to drink LOTS of water Call with any questions or concerns Hang in there! Have a great summer!!

## 2021-07-07 NOTE — Progress Notes (Signed)
   Subjective:    Patient ID: Carol Quinn, female    DOB: 1955-01-24, 66 y.o.   MRN: [id number]  HPI HTN- at last visit we decreased the Valsartan HCTZ to 80/12.'5mg'$  daily.  Pt's BP remains well controlled today.  Pt reports feeling better since decreasing meds.  No CP.  + SOB above baseline- following w/ Pulm.  Less leg cramps, less dizziness.  Did have an episode where she lost her vision while standing in the kitchen.  Is having total workup w/ Cards- has done Carotid US, ECHO, and now wearing a monitor.     Review of Systems For ROS see HPI     Objective:   Physical Exam Vitals reviewed.  Constitutional:      General: She is not in acute distress.    Appearance: Normal appearance. She is well-developed. She is not ill-appearing.  HENT:     Head: Normocephalic and atraumatic.  Eyes:     Conjunctiva/sclera: Conjunctivae normal.     Pupils: Pupils are equal, round, and reactive to light.  Neck:     Thyroid: No thyromegaly.  Cardiovascular:     Rate and Rhythm: Normal rate and regular rhythm.     Heart sounds: Normal heart sounds. No murmur heard. Pulmonary:     Effort: Pulmonary effort is normal. No respiratory distress.     Breath sounds: Normal breath sounds.  Abdominal:     General: There is no distension.     Palpations: Abdomen is soft.     Tenderness: There is no abdominal tenderness.  Musculoskeletal:     Cervical back: Normal range of motion and neck supple.  Lymphadenopathy:     Cervical: No cervical adenopathy.  Skin:    General: Skin is warm and dry.  Neurological:     General: No focal deficit present.     Mental Status: She is alert and oriented to person, place, and time.  Psychiatric:        Mood and Affect: Mood normal.        Behavior: Behavior normal.          Assessment & Plan:

## 2021-07-08 MED ORDER — TRELEGY ELLIPTA 100-62.5-25 MCG/ACT IN AEPB: 1.0000 | INHALATION_SPRAY | Freq: Every day | RESPIRATORY_TRACT | 2 refills | Status: DC

## 2021-07-08 MED ORDER — MODAFINIL 200 MG PO TABS: 200.0000 mg | ORAL_TABLET | Freq: Every day | ORAL | 5 refills | Status: DC

## 2021-07-08 NOTE — Telephone Encounter (Signed)
Modafinil refilled.

## 2021-07-12 ENCOUNTER — Encounter: Payer: Self-pay | Admitting: Family Medicine

## 2021-07-12 NOTE — Telephone Encounter (Signed)
Pt is having issues with BP and notes headaches a few days  Ov Wed last week please advise

## 2021-07-19 ENCOUNTER — Other Ambulatory Visit: Payer: Self-pay

## 2021-07-19 MED ORDER — ESCITALOPRAM OXALATE 20 MG PO TABS: 20.0000 mg | ORAL_TABLET | Freq: Every day | ORAL | 6 refills | Status: DC

## 2021-07-23 ENCOUNTER — Other Ambulatory Visit: Payer: Self-pay

## 2021-07-23 MED ORDER — BUPROPION HCL ER (XL) 150 MG PO TB24: 150.0000 mg | ORAL_TABLET | Freq: Every morning | ORAL | 1 refills | Status: DC

## 2021-08-09 ENCOUNTER — Telehealth: Payer: Self-pay | Admitting: Internal Medicine

## 2021-08-09 NOTE — Telephone Encounter (Signed)
Spoke with the pt  She is needing PA done for modafinil I advised her that we will route to the pharm team to work on this She verbalized understanding  Please advise once you get the approval/denial, thanks!

## 2021-08-10 ENCOUNTER — Telehealth: Payer: Self-pay

## 2021-08-10 ENCOUNTER — Other Ambulatory Visit (HOSPITAL_COMMUNITY): Payer: Self-pay

## 2021-08-10 NOTE — Telephone Encounter (Signed)
Patient Advocate Encounter  Prior Authorization for Modafinil '200MG'$  tablets has been approved.    PA# [id number] Effective dates: 08/10/2021 through 02/10/2022  Patients co-pay is $7 ([facility])

## 2021-08-10 NOTE — Telephone Encounter (Signed)
Patient Advocate Encounter   Received notification that prior authorization for Modafinil '200MG'$  tablets is required.   PA submitted on 08/10/2021 Key : [id number] Status is pending

## 2021-08-11 ENCOUNTER — Inpatient Hospital Stay (HOSPITAL_BASED_OUTPATIENT_CLINIC_OR_DEPARTMENT_OTHER): Payer: Medicare Other | Admitting: Hematology & Oncology

## 2021-08-11 ENCOUNTER — Inpatient Hospital Stay: Payer: Medicare Other | Attending: Hematology & Oncology

## 2021-08-11 ENCOUNTER — Ambulatory Visit (HOSPITAL_BASED_OUTPATIENT_CLINIC_OR_DEPARTMENT_OTHER)
Admission: RE | Admit: 2021-08-11 | Discharge: 2021-08-11 | Disposition: A | Discharge status: Home / Self Care | Payer: Medicare Other | Source: Ambulatory Visit | Attending: Hematology & Oncology | Admitting: Hematology & Oncology

## 2021-08-11 ENCOUNTER — Other Ambulatory Visit: Payer: Self-pay | Admitting: *Deleted

## 2021-08-11 ENCOUNTER — Encounter (HOSPITAL_BASED_OUTPATIENT_CLINIC_OR_DEPARTMENT_OTHER): Payer: Self-pay

## 2021-08-11 ENCOUNTER — Other Ambulatory Visit: Payer: Self-pay

## 2021-08-11 VITALS — BP 109/49 | HR 90 | Temp 98.0°F | Resp 19 | Wt 134.0 lb

## 2021-08-11 DIAGNOSIS — R935 Abnormal findings on diagnostic imaging of other abdominal regions, including retroperitoneum: Secondary | ICD-10-CM | POA: Insufficient documentation

## 2021-08-11 DIAGNOSIS — Z79899 Other long term (current) drug therapy: Secondary | ICD-10-CM | POA: Insufficient documentation

## 2021-08-11 DIAGNOSIS — J42 Unspecified chronic bronchitis: Secondary | ICD-10-CM | POA: Diagnosis not present

## 2021-08-11 DIAGNOSIS — C6931 Malignant neoplasm of right choroid: Secondary | ICD-10-CM | POA: Insufficient documentation

## 2021-08-11 DIAGNOSIS — H349 Unspecified retinal vascular occlusion: Secondary | ICD-10-CM | POA: Insufficient documentation

## 2021-08-11 LAB — CBC WITH DIFFERENTIAL (CANCER CENTER ONLY)
Abs Immature Granulocytes: 0.03 10*3/uL (ref 0.00–0.07)
Basophils Absolute: 0.1 10*3/uL (ref 0.0–0.1)
Basophils Relative: 2 %
Eosinophils Absolute: 0.3 10*3/uL (ref 0.0–0.5)
Eosinophils Relative: 3 %
HCT: 44.7 % (ref 36.0–46.0)
Hemoglobin: 14.9 g/dL (ref 12.0–15.0)
Immature Granulocytes: 0 %
Lymphocytes Relative: 24 %
Lymphs Abs: 1.9 10*3/uL (ref 0.7–4.0)
MCH: 33 pg (ref 26.0–34.0)
MCHC: 33.3 g/dL (ref 30.0–36.0)
MCV: 99.1 fL (ref 80.0–100.0)
Monocytes Absolute: 0.8 10*3/uL (ref 0.1–1.0)
Monocytes Relative: 10 %
Neutro Abs: 4.7 10*3/uL (ref 1.7–7.7)
Neutrophils Relative %: 61 %
Platelet Count: 305 10*3/uL (ref 150–400)
RBC: 4.51 MIL/uL (ref 3.87–5.11)
RDW: 11.8 % (ref 11.5–15.5)
WBC Count: 7.7 10*3/uL (ref 4.0–10.5)
nRBC: 0 % (ref 0.0–0.2)

## 2021-08-11 LAB — CMP (CANCER CENTER ONLY)
ALT: 35 U/L (ref 0–44)
AST: 24 U/L (ref 15–41)
Albumin: 4.6 g/dL (ref 3.5–5.0)
Alkaline Phosphatase: 93 U/L (ref 38–126)
Anion gap: 10 (ref 5–15)
BUN: 12 mg/dL (ref 8–23)
CO2: 35 mmol/L — ABNORMAL HIGH (ref 22–32)
Calcium: 10.5 mg/dL — ABNORMAL HIGH (ref 8.9–10.3)
Chloride: 99 mmol/L (ref 98–111)
Creatinine: 0.82 mg/dL (ref 0.44–1.00)
GFR, Estimated: >60 mL/min (ref >60)
Glucose, Bld: 110 mg/dL — ABNORMAL HIGH (ref 70–99)
Potassium: 4.5 mmol/L (ref 3.5–5.1)
Sodium: 144 mmol/L (ref 135–145)
Total Bilirubin: 0.5 mg/dL (ref 0.3–1.2)
Total Protein: 6.7 g/dL (ref 6.5–8.1)

## 2021-08-11 LAB — LACTATE DEHYDROGENASE: LDH: 129 U/L (ref 98–192)

## 2021-08-11 MED ORDER — IOHEXOL 300 MG/ML  SOLN
100.0000 mL | Freq: Once | INTRAMUSCULAR | Status: AC | PRN
  Administered 2021-08-11: 100 mL via INTRAVENOUS

## 2021-08-11 NOTE — Progress Notes (Signed)
Hematology and Oncology Follow Up Visit  JAQUALA FULLER [id number] Apr 23, 1955 66 y.o. 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 Crego is here today for follow-up.  She is doing okay.  The hot humid summer weather and has not been all that agreeable with her.  She really cannot go out all that much.  She does have underlying COPD.  She does see Pulmonary Medicine for this.  She is on nebulizers.  Unfortunately, she has not yet had her CAT scan.  She will have this later on today.  She has had some abdominal discomfort.  She has had no diarrhea.  She has had no urinary difficulties.  There has been no dysuria, frequency or urgency.  She has had no rashes.  There has been no leg swelling.  She has had no headache.  Overall, I would say performance status is probably ECOG 1.    Medications:  Allergies as of 08/11/2021       Reactions   Clarithromycin Diarrhea, Rash, Other (See Comments)   Hydrocodone Bit-homatrop Mbr Other (See Comments)   paranoia   Hydrocodone Bit-homatrop Mbr Other (See Comments)   paranoia   Codeine Itching   Penicillins Other (See Comments)   Has patient had a PCN reaction causing immediate rash, facial/tongue/throat swelling, SOB or lightheadedness with hypotension: Unknown Has patient had a PCN reaction causing severe rash involving mucus membranes or skin necrosis: Unknown Has patient had a PCN reaction that required hospitalization: Unknown Has patient had a PCN reaction occurring within the last 10 years: No If all of the above answers are "NO", then may proceed with Cephalosporin use.   Topiramate Other (See Comments)   Other reaction(s): WEIGHT LOSS        Medication List        Accurate as of August 11, 2021  9:19 AM. If you have any  questions, ask your nurse or doctor.          STOP taking these medications    valsartan-hydrochlorothiazide 80-12.5 MG tablet Commonly known as: DIOVAN-HCT Stopped by: [name], MD       TAKE these medications    albuterol (2.5 MG/3ML) 0.083% nebulizer solution Commonly known as: PROVENTIL Take 3 mLs (2.5 mg total) by nebulization every 6 (six) hours as needed for wheezing or shortness of breath.   aspirin EC 81 MG tablet Take 81 mg by mouth daily. Swallow whole.   atorvastatin 40 MG tablet Commonly known as: LIPITOR Take 1 tablet (40 mg total) by mouth daily.   buPROPion 150 MG 24 hr tablet Commonly known as: WELLBUTRIN XL Take 1 tablet (150 mg total) by mouth every morning.   Epinastine HCl 0.05 % ophthalmic solution Apply to eye.   escitalopram 20 MG tablet Commonly known as: LEXAPRO Take 1 tablet (20 mg total) by mouth daily.   estradiol 0.0375 MG/24HR Commonly known as: VIVELLE-DOT Place onto the skin 2 (two) times a week.   fexofenadine 180 MG tablet Commonly known as: ALLEGRA Take 180 mg by mouth daily.   Fish Oil 1200 MG Caps Take by mouth daily.   fluticasone 50 MCG/ACT nasal spray Commonly known as: FLONASE Place into the nose.   fluticasone 50 MCG/ACT nasal spray Commonly known as: FLONASE Place into the nose.   Flutter Devi Use as  directed   losartan-hydrochlorothiazide 100-25 MG tablet Commonly known as: HYZAAR Take by mouth daily.   LYSINE PO Take by mouth daily.   modafinil 200 MG tablet Commonly known as: PROVIGIL Take 1 tablet (200 mg total) by mouth daily.   montelukast 10 MG tablet Commonly known as: SINGULAIR TAKE 1 TABLET BY MOUTH ONCE DAILY AT BEDTIME   naphazoline-pheniramine 0.025-0.3 % ophthalmic solution Commonly known as: NAPHCON-A Place into both eyes.   omeprazole 40 MG capsule Commonly known as: PRILOSEC Take 40 mg by mouth 2 (two) times daily.   PreviDent 5000 Booster Plus 1.1 % Pste Generic  drug: Sodium Fluoride   Systane 0.4-0.3 % Soln Generic drug: Polyethyl Glycol-Propyl Glycol Apply 1 drop to eye 4 (four) times daily.   Trelegy Ellipta 100-62.5-25 MCG/ACT Aepb Generic drug: Fluticasone-Umeclidin-Vilant Inhale 1 puff into the lungs daily. What changed: Another medication with the same name was removed. Continue taking this medication, and follow the directions you see here. Changed by: [name], MD   varenicline 1 MG tablet Commonly known as: Chantix Continuing Month Pak Take 1 tablet (1 mg total) by mouth 2 (two) times daily.        Allergies:  Allergies  Allergen Reactions   Clarithromycin Diarrhea, Rash and Other (See Comments)   Hydrocodone Bit-Homatrop Mbr Other (See Comments)    paranoia   Hydrocodone Bit-Homatrop Mbr Other (See Comments)    paranoia   Codeine Itching   Penicillins Other (See Comments)     Has patient had a PCN reaction causing immediate rash, facial/tongue/throat swelling, SOB or lightheadedness with hypotension: Unknown Has patient had a PCN reaction causing severe rash involving mucus membranes or skin necrosis: Unknown Has patient had a PCN reaction that required hospitalization: Unknown Has patient had a PCN reaction occurring within the last 10 years: No If all of the above answers are "NO", then may proceed with Cephalosporin use.    Topiramate Other (See Comments)    Other reaction(s): WEIGHT LOSS     Past Medical History, Surgical history, Social history, and Family History were reviewed and updated.  Review of Systems: Review of Systems  Constitutional: Negative.   HENT: Negative.    Eyes: Negative.   Respiratory: Negative.    Cardiovascular: Negative.   Gastrointestinal:  Positive for abdominal pain.  Genitourinary: Negative.   Musculoskeletal: Negative.   Skin: Negative.   Neurological: Negative.   Endo/Heme/Allergies: Negative.   Psychiatric/Behavioral: Negative.        Physical Exam:  weight is  134 lb (60.8 kg). Her oral temperature is 98 F (36.7 C). Her blood pressure is 109/49 (abnormal) and her pulse is 90. Her respiration is 19 and oxygen saturation is 97%.   Wt Readings from Last 3 Encounters:  08/11/21 134 lb (60.8 kg)  07/07/21 133 lb 4 oz (60.4 kg)  05/26/21 136 lb 9.6 oz (62 kg)    Physical Exam Vitals reviewed.  HENT:     Head: Normocephalic and atraumatic.  Eyes:     Pupils: Pupils are equal, round, and reactive to light.     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  Left eye is unremarkable.  She has good extraocular muscle movement of the left eye.  Cardiovascular:     Rate and Rhythm: Normal rate and regular rhythm.     Heart sounds: Normal heart sounds.  Pulmonary:     Effort: Pulmonary effort is normal.     Breath sounds: Normal breath sounds.  Abdominal:     General: Bowel sounds are normal.     Palpations: Abdomen is soft.  Musculoskeletal:        General: No tenderness or deformity. Normal range of motion.     Cervical back: Normal range of motion.  Lymphadenopathy:     Cervical: No cervical adenopathy.  Skin:    General: Skin is warm and dry.     Findings: No erythema or rash.  Neurological:     Mental Status: She is alert and oriented to person, place, and time.  Psychiatric:        Behavior: Behavior normal.        Thought Content: Thought content normal.        Judgment: Judgment normal.      Lab Results  Component Value Date   WBC 7.7 08/11/2021   HGB 14.9 08/11/2021   HCT 44.7 08/11/2021   MCV 99.1 08/11/2021   PLT 305 08/11/2021   No results found for: "FERRITIN", "IRON", "TIBC", "UIBC", "IRONPCTSAT" Lab Results  Component Value Date   RBC 4.51 08/11/2021   No results found for: "KPAFRELGTCHN", "LAMBDASER", "KAPLAMBRATIO" No results found for: "IGGSERUM", "IGA", "IGMSERUM" No results found for: "TOTALPROTELP", "ALBUMINELP", "A1GS", "A2GS", "BETS", "BETA2SER", "GAMS", "MSPIKE", "SPEI"   Chemistry       Component Value Date/Time   NA 144 08/11/2021 0808   NA 143 12/23/2016 0848   K 4.5 08/11/2021 0808   K 4.3 12/23/2016 0848   CL 99 08/11/2021 0808   CL 99 12/23/2016 0848   CO2 35 (H) 08/11/2021 0808   CO2 31 12/23/2016 0848   BUN 12 08/11/2021 0808   BUN 10 12/23/2016 0848   CREATININE 0.82 08/11/2021 0808   CREATININE 1.0 12/23/2016 0848      Component Value Date/Time   CALCIUM 10.5 (H) 08/11/2021 0808   CALCIUM 9.4 12/23/2016 0848   ALKPHOS 93 08/11/2021 0808   ALKPHOS 99 (H) 12/23/2016 0848   AST 24 08/11/2021 0808   ALT 35 08/11/2021 0808   ALT 26 12/23/2016 0848   BILITOT 0.5 08/11/2021 0808      Impression and Plan: Ms. Mcdougald is a very pleasant 66 yo caucasian female with a choroidal melanoma of the right eye with right eye enucleation in October 2017.   I would have to believe that her CAT scan still would look okay.  Her LFTs are all right.  I cannot feel her liver on exam.  She really has nothing that would suggest recurrence.  However, with ocular melanoma, there is was chance that there could be a recurrence.  I forgot to mention that she said that a nephew of hers has peritoneal carcinoma.  It sounds like this is a recurrent colon cancer and since he had this 3 years ago.  We will see what her CAT scan shows.  I think that this CAT scan looks okay, then we can probably hold off on any further CAT scans since it has been almost 6 years.  We will plan for another follow-up in 6 months.  [name], MD 8/2/20239:19 AM

## 2021-08-13 NOTE — Telephone Encounter (Signed)
Called patient and notified her that her Prior Auth her her medication was approved and that she should be able to pick up medication. I advised patient to call office back if she had any issues.   Patient Advocate Encounter   Prior Authorization for Modafinil '200MG'$  tablets has been approved.     PA# [id number] Effective dates: 08/10/2021 through 02/10/2022   Patients co-pay is $7 ([facility])  Nothing further needed

## 2021-08-31 ENCOUNTER — Other Ambulatory Visit: Payer: Self-pay

## 2021-08-31 MED ORDER — MONTELUKAST SODIUM 10 MG PO TABS: 10.0000 mg | ORAL_TABLET | Freq: Every day | ORAL | 3 refills | Status: DC

## 2021-09-03 ENCOUNTER — Encounter: Payer: Self-pay | Admitting: Internal Medicine

## 2021-09-03 ENCOUNTER — Telehealth: Payer: Self-pay | Admitting: *Deleted

## 2021-09-03 MED ORDER — DOXYCYCLINE HYCLATE 100 MG PO TABS: 100.0000 mg | ORAL_TABLET | Freq: Two times a day (BID) | ORAL | 0 refills | Status: DC

## 2021-09-03 MED ORDER — PREDNISONE 10 MG PO TABS: ORAL_TABLET | ORAL | 0 refills | Status: DC

## 2021-09-03 NOTE — Telephone Encounter (Signed)
Called and spoke with patient.  [name], NP recommendations given.  Understanding stated.  Patient has upcoming OV with Dr. Annamaria Boots scheduled. Doxycycline and Prednisone prescriptions sent to requested [facility].  Nothing further at this time.

## 2021-09-03 NOTE — Telephone Encounter (Signed)
Primary Pulmonologist: Young Last office visit and with whom: 04/29/2021 Young What do we see them for (pulmonary problems): smoker, COPD mixed, chronic respiratory failure with hypoxia Last OV assessment/plan:    Assessment & Plan Note by Deneise Lever, MD at 05/19/2021 11:17 AM  Author: Deneise Lever, MD Author Type: Physician Filed: 05/19/2021 11:18 AM  Note Status: Written Cosign: Cosign Not Required Encounter Date: 04/29/2021  Problem: Tobacco abuse  Editor: Deneise Lever, MD (Physician)               Guidance and support again offered. Plan-Chantix prescription sent        Assessment & Plan Note by Deneise Lever, MD at 05/19/2021 11:16 AM  Author: Deneise Lever, MD Author Type: Physician Filed: 05/19/2021 11:17 AM  Note Status: Written Cosign: Cosign Not Required Encounter Date: 04/29/2021  Problem: COPD mixed type [facility])  Editor: Deneise Lever, MD (Physician)               Trelegy, rescue inhaler and her nebulizer machine work well Plan-refills as needed        Assessment & Plan Note by Deneise Lever, MD at 05/19/2021 11:16 AM  Author: Deneise Lever, MD Author Type: Physician Filed: 05/19/2021 11:16 AM  Note Status: Written Cosign: Cosign Not Required Encounter Date: 04/29/2021  Problem: Chronic respiratory failure with hypoxia [facility])  Editor: Deneise Lever, MD (Physician)               She continues to need oxygen for sleep and as needed use with no change.        Patient Instructions by Deneise Lever, MD at 04/29/2021 10:00 AM  Author: Deneise Lever, MD Author Type: Physician Filed: 04/29/2021 10:30 AM  Note Status: Signed Cosign: Cosign Not Required Encounter Date: 04/29/2021  Editor: Deneise Lever, MD (Physician)               [facility]   Doxycycline script sent to [facility]   Please try not to smoke. I hope this summer is good for you. Please call if we can help.       Orthostatic Vitals Recorded in This  Encounter   04/29/2021  1016     Patient Position: Sitting  BP Location: Left Arm  Cuff Size: Normal   Instructions    Return in about 6 months (around 10/29/2021).  [facility] at [facility]   Doxycycline script sent to [facility]   Please try not to smoke. I hope this summer is good for you. Please call if we can help.        Reason for call: Patient states the mucus has developed over four weeks.  The first week it was mild mucus but above normal.  The second and third week headache, trouble breathing (sob) more than normal, extreme fatigue, greenish mucus started.  This week it has been a lot worse.  Yesterday and today it has been horrible.  I am having very severe shortness of breath.  I am coughing and having drainage and wheezing.   Using nebs 4 x day with min relief.  Using Allegra, flonse, trelegy and singular.  Not using flutter value.  Denies any fever, chills or body aches.  Congestion in chest.  Covid test this week was negative.  [name], please advise.  (examples of things to ask: : When did symptoms start? Fever? Cough? Productive? Color to sputum? More sputum than usual? Wheezing? Have you  needed increased oxygen? Are you taking your respiratory medications? What over the counter measures have you tried?)  Allergies  Allergen Reactions   Clarithromycin Diarrhea, Rash and Other (See Comments)   Hydrocodone Bit-Homatrop Mbr Other (See Comments)    paranoia   Hydrocodone Bit-Homatrop Mbr Other (See Comments)    paranoia   Codeine Itching   Penicillins Other (See Comments)     Has patient had a PCN reaction causing immediate rash, facial/tongue/throat swelling, SOB or lightheadedness with hypotension: Unknown Has patient had a PCN reaction causing severe rash involving mucus membranes or skin necrosis: Unknown Has patient had a PCN reaction that required hospitalization: Unknown Has patient had a PCN reaction occurring within the last 10 years: No If all of the  above answers are "NO", then may proceed with Cephalosporin use.    Topiramate Other (See Comments)    Other reaction(s): WEIGHT LOSS     Immunization History  Administered Date(s) Administered   Influenza Split 09/10/2008, 10/10/2009, 09/11/2010, 10/21/2011, 10/25/2012, 12/24/2014, 10/15/2015   Influenza Whole 09/10/2008, 10/10/2009, 09/11/2010   Influenza, Quadrivalent, Recombinant, Inj, Pf 10/01/2018   Influenza,inj,Quad PF,6+ Mos 09/10/2013, 10/28/2016, 10/03/2017, 10/01/2018, 10/30/2019, 10/29/2020   Influenza-Unspecified 10/23/2009, 10/21/2011, 10/25/2012, 12/24/2014, 10/15/2015, 10/19/2018, 09/10/2020   Moderna Sars-Covid-2 Vaccination 03/28/2019, 04/24/2019, 11/20/2019, 08/06/2020   Pneumococcal Conjugate-13 01/31/2017   Pneumococcal Polysaccharide-23 01/11/2007, 09/11/2007, 10/30/2017   Tdap 12/21/2012   Zoster Recombinat (Shingrix) 10/01/2018   Zoster, Live 02/05/2019     Medication  (Newest Message First)  Gillis Santa Mehaffey  P [facility] (supporting Deneise Lever, MD) 1 hour ago (2:37 PM)    The mucus has developed over four weeks.  The first week it was mild mucus but above normal.  The second and third week headache, trouble breathing more than normal, extreme fatigue, greenish mucus started.  This week it has been a lot worse.  Yesterday and today it has been horrible.  I am having very severe shortness of breath.  I am coughing and having drainage and wheezing.     Thank you, Ahtziry Saathoff, CMA  Gillis Santa Bumpus 1 hour ago (2:21 PM)    Ms.Klopf,  I hate to hear you are feeling under the weather (though I am glad your COVID test was negative), approx. how long have you been having this build-up of mucus? Have you been running any fevers or having increased SOB that is different that your normal?     Gillis Santa Rohrman  P [facility] (supporting Deneise Lever, MD) 3 hours ago (12:15 PM)    Dr. Annamaria Boots,     I am coughing up a  greenish mucus that is very thick.  I have all the symptoms of a respiratory infection. I took a home CoVid  test and it was negative .  Can you please call in a prescription for Doxycycline and Prednisone to [facility] for me?     Thank you, Ambreen Schlauch

## 2021-09-03 NOTE — Telephone Encounter (Signed)
Symptoms are consistent with a COPD flare.  Patient is high risk for decompensation.  Recommend beginning empiric antibiotics and steroids.  Please make sure she has a follow-up with Dr. Annamaria Boots. If symptoms not improving she will need to come in for office visit sooner or go to the emergency room if not improving or worsens   Doxycycline '100mg'$  Twice daily  for 7 days  Prednisone '20mg'$  daily for 5 days.   Please contact office for sooner follow up if symptoms do not improve or worsen or seek emergency care

## 2021-10-29 ENCOUNTER — Ambulatory Visit: Payer: Medicare Other | Admitting: Internal Medicine

## 2021-11-02 ENCOUNTER — Other Ambulatory Visit: Payer: Self-pay | Admitting: *Deleted

## 2021-11-02 MED ORDER — TRELEGY ELLIPTA 100-62.5-25 MCG/ACT IN AEPB: 1.0000 | INHALATION_SPRAY | Freq: Every day | RESPIRATORY_TRACT | 5 refills | Status: DC

## 2021-11-06 NOTE — Progress Notes (Unsigned)
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 Patient [facility]. Feels like breathing is worse since last visit. Productive cough with yellow/brown sputum. Worse last couple months and in the morning  She continues to sleep with oxygen at night.  Emphasis today on getting off of cigarettes again.   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 We discussed active chronic bronchitis symptoms and her recent chest CT. CT chest/abd/pelvis 02/11/21- IMPRESSION: 1. No evidence of metastatic disease in the chest, abdomen or pelvis. 2. Coronary artery calcifications, aortic Atherosclerosis (ICD10-I70.0) and Emphysema (ICD10-J43.9).  11/08/21- 66 year old female former Smoker(20pkyrs)(husband smokes) followed for COPD GOLD III-IV, chronic hypoxic respiratory failure, idiopathic hypersomnia-  MSLT 4.1 minutes, complicated by depression, Occular melanoma/ R enucleation, L Retinal Embolism, ASCVD/ CAD,  O2 2L sleep and prn APS/ [facility] -Prednisone 10 mg only occasionally, Trelegy100, singulair, neb albuterol, albuterol -hfa, Allegra, Modafinil 200 mg daily, Chantix,  Covid vax- 4 Moderna Flu vax-today standard She reports she quit smoking about 2 months ago and continues Chantix "I am trying".  Encouragement given.  Persistent cough productive of thick white mucus with no blood, purulent material or fever. Pending mammogram.  Denies adenopathy or acute concern.  ROS-see HPI   + = positive Constitutional:   No-   weight loss, night sweats, fevers, chills, +fatigue, lassitude. HEENT:   No-  headaches, difficulty swallowing, tooth/dental problems, sore throat,       No-  sneezing, itching, ear ache, +nasal congestion, +post nasal drip,  CV:  No-   chest pain, orthopnea, PND, swelling in lower extremities, anasarca, dizziness, palpitations Resp: + shortness of breath with exertion or at rest.                +productive cough,  + non-productive cough,  No- coughing up of blood.              change in color of mucus.   wheezing.   Skin: No-   rash or lesions. GI:  No-   heartburn, indigestion, abdominal pain, nausea, vomiting,  GU:  MS:  No-   joint pain or swelling.   Neuro-     nothing unusual Psych:  No- change in mood or affect. No depression or anxiety.  No memory loss.  OBJ- Physical Exam     General- Alert, Oriented, Affect-appropriate,  + overweight Skin- clear  Lymphadenopathy- none Head- atraumatic            Eyes- + R prosthesis            Ears- Hearing, canals-normal  Nose- clear, no-Septal dev, mucus, polyps, erosion, perforation             Throat- Mallampati IV , mucosa clear/  drainage- none, tonsils- small residual Neck- flexible , trachea midline, no stridor , thyroid nl, carotid no bruit Chest - symmetrical excursion , unlabored           Heart/CV-  RRR , no murmur , no gallop  , no rub, nl s1 s2                           - JVD- none , edema- none, stasis changes- none, varices- none           Lung-  +distant/ clear, Wheeze- none, cough+slight , dullness-none, rub- none,             Chest wall-  Abd-  Br/ Gen/ Rectal- Not done, not indicated Extrem- cyanosis- none, clubbing, none, atrophy- none, strength- nl Neuro- grossly intact to observation

## 2021-11-08 ENCOUNTER — Encounter: Payer: Self-pay | Admitting: Internal Medicine

## 2021-11-08 ENCOUNTER — Ambulatory Visit (INDEPENDENT_AMBULATORY_CARE_PROVIDER_SITE_OTHER): Payer: Medicare Other | Admitting: Internal Medicine

## 2021-11-08 VITALS — BP 110/62 | HR 92 | Ht 62.0 in | Wt 133.8 lb

## 2021-11-08 DIAGNOSIS — G4710 Hypersomnia, unspecified: Secondary | ICD-10-CM

## 2021-11-08 DIAGNOSIS — J449 Chronic obstructive pulmonary disease, unspecified: Secondary | ICD-10-CM | POA: Diagnosis not present

## 2021-11-08 DIAGNOSIS — J9611 Chronic respiratory failure with hypoxia: Secondary | ICD-10-CM

## 2021-11-08 DIAGNOSIS — Z23 Encounter for immunization: Secondary | ICD-10-CM | POA: Diagnosis not present

## 2021-11-08 MED ORDER — MODAFINIL 200 MG PO TABS: 200.0000 mg | ORAL_TABLET | Freq: Every day | ORAL | 5 refills | Status: DC

## 2021-11-08 MED ORDER — ALBUTEROL SULFATE HFA 108 (90 BASE) MCG/ACT IN AERS: 2.0000 | INHALATION_SPRAY | Freq: Four times a day (QID) | RESPIRATORY_TRACT | 12 refills | Status: DC | PRN

## 2021-11-08 MED ORDER — TRELEGY ELLIPTA 100-62.5-25 MCG/ACT IN AEPB: 1.0000 | INHALATION_SPRAY | Freq: Every day | RESPIRATORY_TRACT | 12 refills | Status: DC

## 2021-11-08 NOTE — Assessment & Plan Note (Signed)
Mild persistent chronic bronchitis pattern since she quit smoking. Plan-discussed hydration and Mucinex to thin mucus.  Refill Trelegy and albuterol.

## 2021-11-08 NOTE — Assessment & Plan Note (Signed)
Continues to benefit from modafinil.  We will refill as needed.

## 2021-11-08 NOTE — Patient Instructions (Signed)
Refills sent for modafinil, Trelegy and albuterol inhaler  Order- Flu vax- standard  Suggest extra water and otc Mucinex to help thin mucus

## 2021-11-08 NOTE — Assessment & Plan Note (Signed)
Continues oxygen 2 L for sleep and as needed.

## 2021-11-30 ENCOUNTER — Encounter: Payer: Self-pay | Admitting: Family Medicine

## 2021-11-30 ENCOUNTER — Ambulatory Visit (INDEPENDENT_AMBULATORY_CARE_PROVIDER_SITE_OTHER): Payer: Medicare Other | Admitting: Family Medicine

## 2021-11-30 VITALS — BP 122/64 | HR 62 | Temp 98.2°F | Resp 19 | Ht 62.0 in | Wt 133.0 lb

## 2021-11-30 DIAGNOSIS — I10 Essential (primary) hypertension: Secondary | ICD-10-CM | POA: Diagnosis not present

## 2021-11-30 DIAGNOSIS — Z0000 Encounter for general adult medical examination without abnormal findings: Secondary | ICD-10-CM

## 2021-11-30 DIAGNOSIS — E559 Vitamin D deficiency, unspecified: Secondary | ICD-10-CM | POA: Diagnosis not present

## 2021-11-30 LAB — CBC WITH DIFFERENTIAL/PLATELET
Basophils Absolute: 0.1 10*3/uL (ref 0.0–0.1)
Basophils Relative: 1.2 % (ref 0.0–3.0)
Eosinophils Absolute: 0.2 10*3/uL (ref 0.0–0.7)
Eosinophils Relative: 2.9 % (ref 0.0–5.0)
HCT: 42.5 % (ref 36.0–46.0)
Hemoglobin: 14.3 g/dL (ref 12.0–15.0)
Lymphocytes Relative: 24.5 % (ref 12.0–46.0)
Lymphs Abs: 1.9 10*3/uL (ref 0.7–4.0)
MCHC: 33.7 g/dL (ref 30.0–36.0)
MCV: 96.8 fl (ref 78.0–100.0)
Monocytes Absolute: 0.7 10*3/uL (ref 0.1–1.0)
Monocytes Relative: 9.2 % (ref 3.0–12.0)
Neutro Abs: 4.7 10*3/uL (ref 1.4–7.7)
Neutrophils Relative %: 62.2 % (ref 43.0–77.0)
Platelets: 329 10*3/uL (ref 150.0–400.0)
RBC: 4.39 Mil/uL (ref 3.87–5.11)
RDW: 12.3 % (ref 11.5–15.5)
WBC: 7.6 10*3/uL (ref 4.0–10.5)

## 2021-11-30 LAB — BASIC METABOLIC PANEL
BUN: 17 mg/dL (ref 6–23)
CO2: 34 mEq/L — ABNORMAL HIGH (ref 19–32)
Calcium: 9.5 mg/dL (ref 8.4–10.5)
Chloride: 97 mEq/L (ref 96–112)
Creatinine, Ser: 0.61 mg/dL (ref 0.40–1.20)
GFR: 94.15 mL/min (ref >60.00)
Glucose, Bld: 98 mg/dL (ref 70–99)
Potassium: 3.8 mEq/L (ref 3.5–5.1)
Sodium: 138 mEq/L (ref 135–145)

## 2021-11-30 LAB — TSH: TSH: 3.05 u[IU]/mL (ref 0.35–5.50)

## 2021-11-30 LAB — LIPID PANEL
Cholesterol: 136 mg/dL (ref 0–200)
HDL: 59.2 mg/dL (ref >39.00)
LDL Cholesterol: 61 mg/dL (ref 0–99)
NonHDL: 77.22
Total CHOL/HDL Ratio: 2
Triglycerides: 83 mg/dL (ref 0.0–149.0)
VLDL: 16.6 mg/dL (ref 0.0–40.0)

## 2021-11-30 LAB — HEPATIC FUNCTION PANEL
ALT: 29 U/L (ref 0–35)
AST: 21 U/L (ref 0–37)
Albumin: 4.2 g/dL (ref 3.5–5.2)
Alkaline Phosphatase: 81 U/L (ref 39–117)
Bilirubin, Direct: 0.1 mg/dL (ref 0.0–0.3)
Total Bilirubin: 0.4 mg/dL (ref 0.2–1.2)
Total Protein: 6.7 g/dL (ref 6.0–8.3)

## 2021-11-30 LAB — VITAMIN D 25 HYDROXY (VIT D DEFICIENCY, FRACTURES): VITD: 44.25 ng/mL (ref 30.00–100.00)

## 2021-11-30 MED ORDER — VALSARTAN-HYDROCHLOROTHIAZIDE 160-25 MG PO TABS: 1.0000 | ORAL_TABLET | Freq: Every day | ORAL | 1 refills | Status: DC

## 2021-11-30 NOTE — Assessment & Plan Note (Signed)
Pt's PE WNL w/ exception of known prosthetic eye.  UTD on mammo, colonoscopy, flu.  Check labs.  Anticipatory guidance provided.

## 2021-11-30 NOTE — Assessment & Plan Note (Signed)
Chronic problem.  Excellent control.  Check labs but no anticipated med changes.  Will follow.

## 2021-11-30 NOTE — Assessment & Plan Note (Signed)
Check labs and replete prn. 

## 2021-11-30 NOTE — Patient Instructions (Addendum)
Follow up in 6 months to recheck BP and cholesterol We'll notify you of your lab results and make any changes if needed GET your RSV vaccine at the pharmacy Keep up the good work on healthy diet and regular exercise- you look great! Call with any questions or concerns Stay Safe!  Stay Healthy! Happy Holidays!!

## 2021-11-30 NOTE — Progress Notes (Signed)
   Subjective:    Patient ID: Carol Quinn, female    DOB: [birth year], 66 y.o.   MRN: [id number]  HPI CPE- UTD on mammo, colonoscopy, flu.  UTD on COVID  Patient Care Team    Relationship Specialty Notifications Start End  Midge Minium, MD PCP - General Family Medicine  07/17/10   Minus Breeding, MD PCP - Cardiology Cardiology  09/19/19   Lynder Parents., MD  Obstetrics and Gynecology  02/14/14   Hale Bogus., MD Referring Physician Gastroenterology  02/14/14   Iran Ouch, MD Referring Physician Ophthalmology  05/10/17   Midge Minium, MD Referring Physician Family Medicine  07/10/19     Health Maintenance  Topic Date Due   Medicare Annual Wellness (AWV)  12/17/2021   Lung Cancer Screening  02/11/2022   MAMMOGRAM  03/16/2022   COLONOSCOPY (Pts 45-26yr Insurance coverage will need to be confirmed)  03/22/2027   INFLUENZA VACCINE  Completed   Hepatitis C Screening  Completed   HIV Screening  Completed   HPV VACCINES  Aged Out   COVID-19 Vaccine  Discontinued   Zoster Vaccines- Shingrix  Discontinued      Review of Systems Patient reports no vision/ hearing changes, adenopathy,fever, weight change,  persistant/recurrent hoarseness , swallowing issues, chest pain, palpitations, edema, persistant/recurrent cough, hemoptysis, gastrointestinal bleeding (melena, rectal bleeding), abdominal pain, significant heartburn, bowel changes, GU symptoms (dysuria, hematuria, incontinence), Gyn symptoms (abnormal  bleeding, pain),  syncope, focal weakness, memory loss, numbness & tingling, skin/nail changes, abnormal bruising or bleeding, anxiety, or depression.  + chronic SOB  + hair loss    Objective:   Physical Exam General Appearance:    Alert, cooperative, no distress, appears stated age  Head:    Normocephalic, without obvious abnormality, atraumatic  Eyes:    L PRRL, conjunctiva/cornea clear, EOM's intact both eyes  Ears:    Normal TM's and external ear canals, both  ears  Nose:   Nares normal, septum midline, mucosa normal, no drainage    or sinus tenderness  Throat:   Lips, mucosa, and tongue normal; teeth and gums normal  Neck:   Supple, symmetrical, trachea midline, no adenopathy;    Thyroid: no enlargement/tenderness/nodules  Back:     Symmetric, no curvature, ROM normal, no CVA tenderness  Lungs:     Clear to auscultation bilaterally, respirations unlabored  Chest Wall:    No tenderness or deformity   Heart:    Regular rate and rhythm, S1 and S2 normal, no murmur, rub   or gallop  Breast Exam:    Deferred to GYN  Abdomen:     Soft, non-tender, bowel sounds active all four quadrants,    no masses, no organomegaly  Genitalia:    Deferred to GYN  Rectal:    Extremities:   Extremities normal, atraumatic, no cyanosis or edema  Pulses:   2+ and symmetric all extremities  Skin:   Skin color, texture, turgor normal, no rashes or lesions  Lymph nodes:   Cervical, supraclavicular, and axillary nodes normal  Neurologic:   CNII-XII intact, normal strength, sensation and reflexes    throughout          Assessment & Plan:

## 2021-12-01 ENCOUNTER — Telehealth: Payer: Self-pay

## 2021-12-01 NOTE — Telephone Encounter (Signed)
-----   Message from Midge Minium, MD sent at 12/01/2021  7:26 AM EST ----- Labs look great!  No changes at this time

## 2021-12-01 NOTE — Telephone Encounter (Signed)
Informed pt of lab results  

## 2021-12-07 ENCOUNTER — Encounter: Payer: Self-pay | Admitting: Internal Medicine

## 2021-12-07 MED ORDER — PREDNISONE 10 MG PO TABS: ORAL_TABLET | ORAL | 0 refills | Status: DC

## 2021-12-07 MED ORDER — DOXYCYCLINE HYCLATE 100 MG PO TABS: 100.0000 mg | ORAL_TABLET | Freq: Two times a day (BID) | ORAL | 0 refills | Status: DC

## 2021-12-07 MED ORDER — TRAMADOL HCL 50 MG PO TABS: ORAL_TABLET | ORAL | 0 refills | Status: DC

## 2021-12-07 NOTE — Telephone Encounter (Signed)
Please send doxycycline 100 mg, # 14, 1 twice daily                      Prednisone 10 mg, # 20, 4 X 2 DAYS, 3 X 2 DAYS, 2 X 2 DAYS, 1 X 2 DAYS

## 2021-12-07 NOTE — Telephone Encounter (Signed)
Tramadol sent to [facility]

## 2021-12-07 NOTE — Telephone Encounter (Signed)
Dr. Annamaria Boots, please see pt's messages regarding acute symptoms. Thanks so much!   Allergies  Allergen Reactions   Clarithromycin Diarrhea, Rash and Other (See Comments)   Hydrocodone Bit-Homatrop Mbr Other (See Comments)    paranoia   Hydrocodone Bit-Homatrop Mbr Other (See Comments)    paranoia   Codeine Itching   Penicillins Other (See Comments)     Has patient had a PCN reaction causing immediate rash, facial/tongue/throat swelling, SOB or lightheadedness with hypotension: Unknown Has patient had a PCN reaction causing severe rash involving mucus membranes or skin necrosis: Unknown Has patient had a PCN reaction that required hospitalization: Unknown Has patient had a PCN reaction occurring within the last 10 years: No If all of the above answers are "NO", then may proceed with Cephalosporin use.    Topiramate Other (See Comments)    Other reaction(s): WEIGHT LOSS     Current Outpatient Medications on File Prior to Visit  Medication Sig Dispense Refill   albuterol (PROVENTIL) (2.5 MG/3ML) 0.083% nebulizer solution Take 3 mLs (2.5 mg total) by nebulization every 6 (six) hours as needed for wheezing or shortness of breath. 150 mL 11   albuterol (VENTOLIN HFA) 108 (90 Base) MCG/ACT inhaler Inhale 2 puffs into the lungs every 6 (six) hours as needed for wheezing or shortness of breath. 8 g 12   aspirin EC 81 MG tablet Take 81 mg by mouth daily. Swallow whole.     atorvastatin (LIPITOR) 40 MG tablet Take 1 tablet (40 mg total) by mouth daily. 90 tablet 3   buPROPion (WELLBUTRIN XL) 150 MG 24 hr tablet Take 1 tablet (150 mg total) by mouth every morning. 90 tablet 1   Epinastine HCl 0.05 % ophthalmic solution Apply to eye.     escitalopram (LEXAPRO) 20 MG tablet Take 1 tablet (20 mg total) by mouth daily. 30 tablet 6   estradiol (VIVELLE-DOT) 0.0375 MG/24HR Place onto the skin 2 (two) times a week.     fexofenadine (ALLEGRA) 180 MG tablet Take 180 mg by mouth daily.     fluticasone  (FLONASE) 50 MCG/ACT nasal spray Place into the nose.     fluticasone (FLONASE) 50 MCG/ACT nasal spray Place into the nose.     Fluticasone-Umeclidin-Vilant (TRELEGY ELLIPTA) 100-62.5-25 MCG/ACT AEPB Inhale 1 puff into the lungs daily. 60 each 12   modafinil (PROVIGIL) 200 MG tablet Take 1 tablet (200 mg total) by mouth daily. 30 tablet 5   montelukast (SINGULAIR) 10 MG tablet Take 1 tablet (10 mg total) by mouth at bedtime. 30 tablet 3   naphazoline-pheniramine (NAPHCON-A) 0.025-0.3 % ophthalmic solution Place into both eyes.     omeprazole (PRILOSEC) 40 MG capsule Take 40 mg by mouth 2 (two) times daily.     Polyethyl Glycol-Propyl Glycol (SYSTANE) 0.4-0.3 % SOLN Apply 1 drop to eye 4 (four) times daily.     PREVIDENT 5000 BOOSTER PLUS 1.1 % PSTE      Respiratory Therapy Supplies (FLUTTER) DEVI Use as directed 1 each 0   valsartan-hydrochlorothiazide (DIOVAN-HCT) 160-25 MG tablet Take 1 tablet by mouth daily. 90 tablet 1   varenicline (CHANTIX CONTINUING MONTH PAK) 1 MG tablet Take 1 tablet (1 mg total) by mouth 2 (two) times daily. 60 tablet 12   Current Facility-Administered Medications on File Prior to Visit  Medication Dose Route Frequency Provider Last Rate Last Admin   0.9 %  sodium chloride infusion  500 mL Intravenous Once Pyrtle, Lajuan Lines, MD

## 2021-12-07 NOTE — Telephone Encounter (Signed)
Also please send tramadol 50 mg, # 30, 1 every 6 hours as needed for cough

## 2021-12-07 NOTE — Telephone Encounter (Signed)
Dr. Annamaria Boots please advise on the tramadol. There is a warning label on the Tramadol script prior to ordering, it is below. Thanks.  (Abx and pred already sent to pharmacy)   Per  STOP ACT: If RX is the initial RX, limit to 5 day supply for ACUTE PAIN or 7 day supply for Post-Op PAIN.

## 2022-01-11 ENCOUNTER — Other Ambulatory Visit: Payer: Self-pay

## 2022-01-11 DIAGNOSIS — F32A Depression, unspecified: Secondary | ICD-10-CM

## 2022-01-11 MED ORDER — BUPROPION HCL ER (XL) 150 MG PO TB24: 150.0000 mg | ORAL_TABLET | Freq: Every morning | ORAL | 1 refills | Status: DC

## 2022-01-21 ENCOUNTER — Ambulatory Visit (INDEPENDENT_AMBULATORY_CARE_PROVIDER_SITE_OTHER): Payer: Medicare Other | Admitting: Internal Medicine

## 2022-01-21 ENCOUNTER — Encounter: Payer: Self-pay | Admitting: *Deleted

## 2022-01-21 VITALS — BP 110/74 | HR 91 | Ht 62.0 in | Wt 132.0 lb

## 2022-01-21 DIAGNOSIS — R141 Gas pain: Secondary | ICD-10-CM

## 2022-01-21 DIAGNOSIS — R9389 Abnormal findings on diagnostic imaging of other specified body structures: Secondary | ICD-10-CM

## 2022-01-21 DIAGNOSIS — Z86010 Personal history of colonic polyps: Secondary | ICD-10-CM

## 2022-01-21 DIAGNOSIS — R140 Abdominal distension (gaseous): Secondary | ICD-10-CM

## 2022-01-21 DIAGNOSIS — K219 Gastro-esophageal reflux disease without esophagitis: Secondary | ICD-10-CM

## 2022-01-21 DIAGNOSIS — R1013 Epigastric pain: Secondary | ICD-10-CM

## 2022-01-21 MED ORDER — OMEPRAZOLE 40 MG PO CPDR: 40.0000 mg | DELAYED_RELEASE_CAPSULE | Freq: Every day | ORAL | 1 refills | Status: DC

## 2022-01-21 MED ORDER — PLENVU 140 G PO SOLR: 1.0000 | ORAL | 0 refills | Status: DC

## 2022-01-21 NOTE — Patient Instructions (Signed)
Look over and follow the gas diet given you today.  Decrease your omeprazole to 40 mg once daily dosing.  Avoid lactose for 2 weeks to see if this helps your symptoms.  You have been scheduled for an endoscopy and colonoscopy. Please follow the written instructions given to you at your visit today. Please pick up your prep supplies at the pharmacy within the next 1-3 days. If you use inhalers (even only as needed), please bring them with you on the day of your procedure.  _______________________________________________________  If you are age 66 or older, your body mass index should be between 23-30. Your Body mass index is 24.14 kg/m. If this is out of the aforementioned range listed, please consider follow up with your Primary Care Provider.  If you are age 66 or younger, your body mass index should be between 19-25. Your Body mass index is 24.14 kg/m. If this is out of the aformentioned range listed, please consider follow up with your Primary Care Provider.   ________________________________________________________  The [facility] providers would like to encourage you to use [facility] to communicate with providers for non-urgent requests or questions.  Due to long hold times on the telephone, sending your provider a message by [facility] may be a faster and more efficient way to get a response.  Please allow 48 business hours for a response.  Please remember that this is for non-urgent requests.  _______________________________________________________ Due to recent changes in healthcare laws, you may see the results of your imaging and laboratory studies on MyChart before your provider has had a chance to review them.  We understand that in some cases there may be results that are confusing or concerning to you. Not all laboratory results come back in the same time frame and the provider may be waiting for multiple results in order to interpret others.  Please give [place] 48 hours in order for your  provider to thoroughly review all the results before contacting the office for clarification of your results.

## 2022-01-21 NOTE — Progress Notes (Signed)
Subjective:    Patient ID: Carol Quinn, female    DOB: June 23, 1955, 67 y.o.   MRN: [id number]  HPI Carol Quinn is a 67 year old female with a history of colon polyps, colonic diverticulosis, ocular melanoma, COPD on nocturnal oxygen, hypertension and GERD who is here for follow-up and to evaluate gas and bloating and upper abdominal discomfort.  She is here alone today.  I last saw her for upper endoscopy performed on 08/19/2020 for an abnormal CT scan showing duodenal thickening.  EGD on 08/19/2020 was normal with the exception of a 2 cm hiatal hernia.  The ultraslim colonoscope was used to examine to D3.  She reports that she is having issues with "horrible gas and belching".  She is also having frequent nausea but no vomiting.  She has epigastric discomfort just below the xiphoid.  Gets a little worse with eating.  Been present for 6 to 12 months.  Does not seem to get better with avoiding carbonation.  She has had no significant change in her diet or medications.  She is also noticing some mild early satiety but then 30 minutes after eating she feels very hungry again.  Her stomach has a "empty feeling" much of the time.  About 6 months ago she called and discussed the symptoms with the nurse here and her omeprazole which has been longstanding for her was increased to twice daily.  She cannot tell that twice daily therapy has helped.  She has some mild pill dysphagia but no dysphagia for liquid or solid.  Bowel habits are regular and formed 1 to as many as 3 times daily.  No diarrhea.  No blood in stool or melena.   Review of Systems As per HPI, otherwise negative  Current Medications, Allergies, Past Medical History, Past Surgical History, Family History and Social History were reviewed in [employer] record.     Objective:   Physical Exam BP 110/74   Pulse 91   Ht '5\' 2"'$  (1.575 m)   Wt 132 lb (59.9 kg)   BMI 24.14 kg/m  Gen: awake, alert, NAD HEENT:  anicteric CV: RRR, no mrg Pulm: CTA b/l Abd: soft, diffuse discomfort without rebound or guarding, nondistended, +BS throughout Ext: no c/c/e Neuro: nonfocal     Latest Ref Rng & Units 11/30/2021    9:35 AM 08/11/2021    8:08 AM 05/26/2021    9:58 AM  CBC  WBC 4.0 - 10.5 K/uL 7.6  7.7  7.0   Hemoglobin 12.0 - 15.0 g/dL 14.3  14.9  14.1   Hematocrit 36.0 - 46.0 % 42.5  44.7  42.3   Platelets 150.0 - 400.0 K/uL 329.0  305  308.0    CMP     Component Value Date/Time   NA 138 11/30/2021 0935   NA 143 12/23/2016 0848   K 3.8 11/30/2021 0935   K 4.3 12/23/2016 0848   CL 97 11/30/2021 0935   CL 99 12/23/2016 0848   CO2 34 (H) 11/30/2021 0935   CO2 31 12/23/2016 0848   GLUCOSE 98 11/30/2021 0935   GLUCOSE 89 12/23/2016 0848   BUN 17 11/30/2021 0935   BUN 10 12/23/2016 0848   CREATININE 0.61 11/30/2021 0935   CREATININE 0.82 08/11/2021 0808   CREATININE 1.0 12/23/2016 0848   CALCIUM 9.5 11/30/2021 0935   CALCIUM 9.4 12/23/2016 0848   PROT 6.7 11/30/2021 0935   PROT 6.3 (L) 12/23/2016 0848   ALBUMIN 4.2 11/30/2021 0935   ALBUMIN  3.6 12/23/2016 0848   AST 21 11/30/2021 0935   AST 24 08/11/2021 0808   ALT 29 11/30/2021 0935   ALT 35 08/11/2021 0808   ALT 26 12/23/2016 0848   ALKPHOS 81 11/30/2021 0935   ALKPHOS 99 (H) 12/23/2016 0848   BILITOT 0.4 11/30/2021 0935   BILITOT 0.5 08/11/2021 0808   GFRNONAA >60 08/11/2021 0808   GFRAA >60 10/11/2019 0848     CT ABDOMEN AND PELVIS WITH CONTRAST   TECHNIQUE: Multidetector CT imaging of the abdomen and pelvis was performed using the standard protocol following bolus administration of intravenous contrast.   RADIATION DOSE REDUCTION: This exam was performed according to the departmental dose-optimization program which includes automated exposure control, adjustment of the mA and/or kV according to patient size and/or use of iterative reconstruction technique.   CONTRAST:  185m OMNIPAQUE IOHEXOL 300 MG/ML  SOLN    COMPARISON:  Multiple priors including most recent CT February 11, 2021.   FINDINGS: Lower chest: No acute abnormality.   Hepatobiliary: No suspicious hepatic lesion. Gallbladder is unremarkable. No biliary ductal dilation.   Pancreas: No pancreatic ductal dilation or evidence of acute inflammation.   Spleen: No splenomegaly or focal splenic lesion.   Adrenals/Urinary Tract: Nodular thickening of the left adrenal gland measuring up to 9 mm is stable dating back to at least February 21, 2018 and likely reflects a benign tiny adenoma requiring no independent imaging follow-up. Right adrenal gland appears normal. No hydronephrosis. Kidneys demonstrate symmetric enhancement and excretion of contrast material.   Stomach/Bowel: Radiopaque enteric contrast material traverses the descending colon. Mild wall thickening versus underdistention of the gastric antrum. No pathologic dilation of small or large bowel. The appendix and terminal ileum appear normal. Large volume of formed stool throughout the colon as can be seen with constipation. Left-sided colonic diverticulosis without findings of acute diverticulitis.   Vascular/Lymphatic: Aortic atherosclerosis. No pathologically enlarged abdominal or pelvic lymph nodes.   Reproductive: Status post hysterectomy. No adnexal masses.   Other: No significant abdominopelvic free fluid.   Musculoskeletal: No acute osseous abnormality.   IMPRESSION: 1. Mild wall thickening versus underdistention of the gastric antrum, suggest clinical correlation for gastritis. 2. Large volume of formed stool throughout the colon as can be seen with constipation. 3. Left-sided colonic diverticulosis without findings of acute diverticulitis. 4.  Aortic Atherosclerosis (ICD10-I70.0).     Electronically Signed   By: JDahlia BailiffM.D.   On: 08/11/2021 13:47      Assessment & Plan:  67year old female with a history of colon polyps, colonic  diverticulosis, ocular melanoma, COPD on nocturnal oxygen, hypertension and GERD who is here for follow-up and to evaluate gas and bloating and upper abdominal discomfort.   Upper abdominal pain/nausea/early satiety/abnormal CT stomach --CT scan performed 5 months ago suggestive of mild gastric antral thickening though this was not noted at endoscopy in 2022.  Given her ongoing symptoms however I recommend upper endoscopy.  Other recommendations as follows: -- Upper endoscopy in the outpatient hospital setting due to nocturnal oxygen use -- Decrease omeprazole back to 40 mg once daily given lack of additional benefit with twice daily dosing -- Lactose-free trial x 2 weeks -- Low gas and bloating diet handout given -- Could consider SIBO depending on the above diagnostic findings  2.  History of adenomatous and sessile serrated colon polyps --surveillance colonoscopy indicated in March of this year.  We can perform this test at the same time as her upper endoscopy.  We reviewed the  risk, benefits and alternatives to both upper and lower endoscopy and she is agreeable and wishes to proceed --colonoscopy in outpatient hospital setting  30 minutes total spent today including patient facing time, coordination of care, reviewing medical history/procedures/pertinent radiology studies, and documentation of the encounter.

## 2022-02-01 ENCOUNTER — Other Ambulatory Visit: Payer: Self-pay

## 2022-02-01 DIAGNOSIS — J302 Other seasonal allergic rhinitis: Secondary | ICD-10-CM

## 2022-02-01 MED ORDER — MONTELUKAST SODIUM 10 MG PO TABS: 10.0000 mg | ORAL_TABLET | Freq: Every day | ORAL | 3 refills | Status: DC

## 2022-02-02 ENCOUNTER — Ambulatory Visit (INDEPENDENT_AMBULATORY_CARE_PROVIDER_SITE_OTHER): Payer: Medicare Other

## 2022-02-02 VITALS — Ht 62.0 in | Wt 132.0 lb

## 2022-02-02 DIAGNOSIS — Z0000 Encounter for general adult medical examination without abnormal findings: Secondary | ICD-10-CM | POA: Diagnosis not present

## 2022-02-02 NOTE — Progress Notes (Signed)
Subjective:   Carol Quinn is a 67 y.o. female who presents for Medicare Annual (Subsequent) preventive examination.  Review of Systems    Virtual Visit via Telephone Note  I connected with  Carol Quinn on 02/02/22 at 12:45 PM EST by telephone and verified that I am speaking with the correct person using two identifiers.  Location: Patient: Home Provider: Office Persons participating in the virtual visit: patient/Nurse Health Advisor   I discussed the limitations, risks, security and privacy concerns of performing an evaluation and management service by telephone and the availability of in person appointments. The patient expressed understanding and agreed to proceed.  Interactive audio and video telecommunications were attempted between this nurse and patient, however failed, due to patient having technical difficulties OR patient did not have access to video capability.  We continued and completed visit with audio only.  Some vital signs may be absent or patient reported.   Criselda Peaches, LPN  Cardiac Risk Factors include: advanced age (>15mn, >>50women);hypertension;Other (see comment), Risk factor comments: Dx COPD     Objective:    Today's Vitals   02/02/22 1244  Weight: 132 lb (59.9 kg)  Height: '5\' 2"'$  (1.575 m)   Body mass index is 24.14 kg/m.     02/02/2022   12:57 PM 08/11/2021    8:35 AM 02/11/2021    9:34 AM 12/17/2020   10:29 AM 08/12/2020   10:02 AM 02/13/2020    9:52 AM 12/09/2019    2:22 PM  Advanced Directives  Does Patient Have a Medical Advance Directive? Yes Yes Yes Yes Yes Yes Yes  Type of AParamedicof ARansonLiving will Living will;Healthcare Power of [place]aring [place]Living will HCath[place]Living will HDeBaryLiving will HOracleLiving will HPen ArgylLiving will  Does patient want to make changes to medical advance  directive?  No - Patient declined No - Patient declined      Copy of HEldridgein Chart? No - copy requested  No - copy requested No - copy requested   No - copy requested    Current Medications (verified) Outpatient Encounter Medications as of 02/02/2022  Medication Sig   albuterol (PROVENTIL) (2.5 MG/3ML) 0.083% nebulizer solution Take 3 mLs (2.5 mg total) by nebulization every 6 (six) hours as needed for wheezing or shortness of breath.   albuterol (VENTOLIN HFA) 108 (90 Base) MCG/ACT inhaler Inhale 2 puffs into the lungs every 6 (six) hours as needed for wheezing or shortness of breath.   aspirin EC 81 MG tablet Take 81 mg by mouth daily. Swallow whole.   atorvastatin (LIPITOR) 40 MG tablet Take 1 tablet (40 mg total) by mouth daily.   buPROPion (WELLBUTRIN XL) 150 MG 24 hr tablet Take 1 tablet (150 mg total) by mouth every morning.   doxycycline (VIBRA-TABS) 100 MG tablet Take 1 tablet (100 mg total) by mouth 2 (two) times daily. (Patient not taking: Reported on 01/21/2022)   Epinastine HCl 0.05 % ophthalmic solution Apply to eye.   escitalopram (LEXAPRO) 20 MG tablet Take 1 tablet (20 mg total) by mouth daily.   estradiol (VIVELLE-DOT) 0.0375 MG/24HR Place onto the skin 2 (two) times a week.   fexofenadine (ALLEGRA) 180 MG tablet Take 180 mg by mouth daily.   fluticasone (FLONASE) 50 MCG/ACT nasal spray Place into the nose.   fluticasone (FLONASE) 50 MCG/ACT nasal spray Place into the nose.  Fluticasone-Umeclidin-Vilant (TRELEGY ELLIPTA) 100-62.5-25 MCG/ACT AEPB Inhale 1 puff into the lungs daily.   modafinil (PROVIGIL) 200 MG tablet Take 1 tablet (200 mg total) by mouth daily.   montelukast (SINGULAIR) 10 MG tablet Take 1 tablet (10 mg total) by mouth at bedtime.   naphazoline-pheniramine (NAPHCON-A) 0.025-0.3 % ophthalmic solution Place into both eyes.   omeprazole (PRILOSEC) 40 MG capsule Take 1 capsule (40 mg total) by mouth daily.   PEG-KCl-NaCl-NaSulf-Na Asc-C  (PLENVU) 140 g SOLR Take 1 kit by mouth as directed. Use coupon: BIN: [id number] PNC: CNRX Group: [id number] ID: [id number]   Polyethyl Glycol-Propyl Glycol (SYSTANE) 0.4-0.3 % SOLN Apply 1 drop to eye 4 (four) times daily.   predniSONE (DELTASONE) 10 MG tablet Take 4 tabs for 2 days, then 3 tabs for 2 days, then 2 tabs for 2 days, then 1 tab for 2 days, then stop. (Patient not taking: Reported on 01/21/2022)   PREVIDENT 5000 BOOSTER PLUS 1.1 % PSTE    Respiratory Therapy Supplies (FLUTTER) DEVI Use as directed   traMADol (ULTRAM) 50 MG tablet 1 or 2 every 6 hours if needed for cough (Patient not taking: Reported on 01/21/2022)   valsartan-hydrochlorothiazide (DIOVAN-HCT) 160-25 MG tablet Take 1 tablet by mouth daily.   varenicline (CHANTIX CONTINUING MONTH PAK) 1 MG tablet Take 1 tablet (1 mg total) by mouth 2 (two) times daily.   Facility-Administered Encounter Medications as of 02/02/2022  Medication   0.9 %  sodium chloride infusion    Allergies (verified) Clarithromycin, Hydrocodone bit-homatrop mbr, Hydrocodone bit-homatrop mbr, Codeine, Penicillins, and Topiramate   History: Past Medical History:  Diagnosis Date   Acute bronchitis    Allergic rhinitis    Allergy    Anxiety    Aortic atherosclerosis (HCC)    Cancer (HCC)    Chronic headaches    COPD (chronic obstructive pulmonary disease) (HCC)    emphysema   Depression    Diverticulosis    Emphysema of lung (HCC)    Emphysema, unspecified (HCC)    Exposure to TB    uncle-her PPD neg   GERD (gastroesophageal reflux disease)    Hepatic cyst    left   Hiatal hernia    Hyperlipidemia    Hypertension    MDS (myelodysplastic syndrome), low grade ([place]) 09/23/2016   Ocular melanoma, 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   Oxygen deficiency    RBBB (right bundle branch block with left anterior fascicular block)    Tubular adenoma of colon    Past Surgical History:  Procedure Laterality Date   BREAST BIOPSY Left 2002   COLONOSCOPY  WITH PROPOFOL N/A 03/21/2017   Procedure: COLONOSCOPY WITH PROPOFOL;  Surgeon: Jerene Bears, MD;  Location: [facility];  Service: Gastroenterology;  Laterality: N/A;   NOSE SURGERY     "nasal deviation"   TOTAL ABDOMINAL HYSTERECTOMY     TRIGGER FINGER RELEASE Right 06/05/2018   Procedure: RIGHT THUMB RELEASE TRIGGER FINGER/A-1 PULLEY;  Surgeon: Daryll Brod, MD;  Location: [facility];  Service: Orthopedics;  Laterality: Right;   TUBAL LIGATION     Family History  Problem Relation Age of Onset   Emphysema Mother    Heart attack Mother 42       Died age 27   Heart failure Father 52   Asthma Sister    Breast cancer Sister 63   Cancer Sister    Leukemia Brother    Asthma Maternal Uncle    Arthritis Maternal Grandmother  Cancer Maternal Grandmother    Breast cancer Maternal Grandmother    Cancer Other        aunts   Cancer Other        uncles   Colon cancer Other    Esophageal cancer Neg Hx    Stomach cancer Neg Hx    Rectal cancer Neg Hx    Social History   Socioeconomic History   Marital status: Married    Spouse name: Remo Lipps   Number of children: Not on file   Years of education: Not on file   Highest education level: Associate degree: academic program  Occupational History   Occupation: Database administrator    Comment: [employer]  Tobacco Use   Smoking status: Former    Packs/day: 0.50    Years: 40.00    Total pack years: 20.00    Types: Cigarettes    Quit date: 08/2021    Years since quitting: 0.4    Passive exposure: Current   Smokeless tobacco: Never  Vaping Use   Vaping Use: Former  Substance and Sexual Activity   Alcohol use: Yes    Alcohol/week: 0.0 standard drinks of alcohol    Comment: social   Drug use: No   Sexual activity: Not on file  Other Topics Concern   Not on file  Social History Narrative   Lives with husband.     Social Determinants of Health   Financial Resource Strain: Low Risk  (02/02/2022)    Overall Financial Resource Strain (CARDIA)    Difficulty of Paying Living Expenses: Not hard at all  Food Insecurity: No Food Insecurity (02/02/2022)   Hunger Vital Sign    Worried About Running Out of Food in the Last Year: Never true    Ran Out of Food in the Last Year: Never true  Transportation Needs: No Transportation Needs (02/02/2022)   PRAPARE - Hydrologist (Medical): No    Lack of Transportation (Non-Medical): No  Physical Activity: Inactive (02/02/2022)   Exercise Vital Sign    Days of Exercise per Week: 0 days    Minutes of Exercise per Session: 0 min  Stress: No Stress Concern Present (02/02/2022)   [place]    Feeling of Stress : Not at all  Social Connections: [place] (02/02/2022)   Social Connection and Isolation Panel [NHANES]    Frequency of Communication with Friends and Family: More than three times a week    Frequency of Social Gatherings with Friends and Family: More than three times a week    Attends Religious Services: More than 4 times per year    Active Member of [employer] or Organizations: Yes    Attends Music therapist: More than 4 times per year    Marital Status: Married    Tobacco Counseling Counseling given: Not Answered   Clinical Intake:  Pre-visit preparation completed: No  Pain : No/denies pain     BMI - recorded: 24.14 Nutritional Status: BMI of 19-24  Normal Nutritional Risks: None  How often do you need to have someone help you when you read instructions, pamphlets, or other written materials from your doctor or pharmacy?: 1 - Never  Diabetic?  No  Interpreter Needed?: No  Information entered by :: Rolene Arbour LPN   Activities of Daily Living    02/02/2022   12:51 PM 11/30/2021    8:50 AM  In your present state  of health, do you have any difficulty performing the following activities:  Hearing? 1 0  Comment  Wears Hearing Aids   Vision? 0 0  Difficulty concentrating or making decisions? 0 0  Walking or climbing stairs? 1 0  Comment Dx COPD   Dressing or bathing? 1 0  Comment Husband assist on occasions   Doing errands, shopping? 1 0  Comment Husband assist   Preparing Food and eating ? Y   Comment Husband assist   Using the Toilet? N   In the past six months, have you accidently leaked urine? Y   Comment Wears pads. Followed by PCP   Do you have problems with loss of bowel control? N   Managing your Medications? N   Managing your Finances? N   Housekeeping or managing your Housekeeping? Y   Comment Husband assist     Patient Care Team: Midge Minium, MD as PCP - General (Family Medicine) Minus Breeding, MD as PCP - Cardiology (Cardiology) Lynder Parents., MD (Obstetrics and Gynecology) Hale Bogus., MD as Referring Physician (Gastroenterology) Iran Ouch, MD as Referring Physician (Ophthalmology) Midge Minium, MD as Referring Physician (Family Medicine)  Indicate any recent Medical Services you may have received from other than [facility] providers in the past year (date may be approximate).     Assessment:   This is a routine wellness examination for Jezlyn.  Hearing/Vision screen Hearing Screening - Comments:: Denies hearing difficulties   Vision Screening - Comments:: Wears rx glasses - up to date with routine eye exams with  [place] issues and exercise activities discussed: Exercise limited by: respiratory conditions(s)   Goals Addressed               This Visit's Progress     Patient Stated (pt-stated)        Drink more water and increase activity as tolerated.       Depression Screen    02/02/2022   12:50 PM 11/30/2021    8:50 AM 07/07/2021    9:49 AM 05/26/2021    9:21 AM 12/17/2020   10:30 AM 12/17/2020   10:27 AM 11/23/2020   12:43 PM  PHQ 2/9 Scores  PHQ - 2 Score 0 4 0 5 0 0 6  PHQ- 9 Score  12 0 10   12     Fall Risk    02/02/2022   12:56 PM 11/30/2021    8:50 AM 07/07/2021    9:50 AM 05/26/2021    9:22 AM 12/17/2020   10:30 AM  Fall Risk   Falls in the past year? 0 0 1 1 0  Number falls in past yr: 0  1 1 0  Injury with Fall? 0  1 0 0  Risk for fall due to : No Fall Risks No Fall Risks History of fall(s) History of fall(s)   Follow up Falls prevention discussed Falls evaluation completed Falls evaluation completed Falls evaluation completed Falls evaluation completed    [place]:  Any stairs in or around the home? Yes  If so, are there any without handrails? No  Home free of loose throw rugs in walkways, pet beds, electrical cords, etc? Yes  Adequate lighting in your home to reduce risk of falls? Yes   ASSISTIVE DEVICES UTILIZED TO PREVENT FALLS:  Life alert? No  Use of a cane, walker or w/c? No  Grab bars in the bathroom? Yes  Shower chair  or bench in shower? No  Elevated toilet seat or a handicapped toilet? Yes   TIMED UP AND GO:  Was the test performed? No . Audio Visit   Cognitive Function:      08/02/2013    3:00 PM  Montreal Cognitive Assessment   Visuospatial/ Executive (0/5) 5  Naming (0/3) 3  Attention: Read list of digits (0/2) 2  Attention: Read list of letters (0/1) 0  Attention: Serial 7 subtraction starting at 100 (0/3) 3  Language: Repeat phrase (0/2) 2  Language : Fluency (0/1) 1  Abstraction (0/2) 2  Delayed Recall (0/5) 4  Orientation (0/6) 6  Total 28  Adjusted Score (based on education) 28      02/02/2022   12:57 PM  6CIT Screen  What Year? 0 points  What month? 0 points  What time? 0 points  Count back from 20 0 points  Months in reverse 0 points  Repeat phrase 0 points  Total Score 0 points    Immunizations Immunization History  Administered Date(s) Administered   Influenza Split 09/10/2008, 10/10/2009, 09/11/2010, 10/21/2011, 10/25/2012, 12/24/2014, 10/15/2015   Influenza Whole 09/10/2008,  10/10/2009, 09/11/2010   Influenza, Quadrivalent, Recombinant, Inj, Pf 10/01/2018   Influenza,inj,Quad PF,6+ Mos 09/10/2013, 10/28/2016, 10/03/2017, 10/01/2018, 10/30/2019, 10/29/2020, 11/08/2021   Influenza-Unspecified 10/23/2009, 10/21/2011, 10/25/2012, 12/24/2014, 10/15/2015, 10/19/2018, 09/10/2020   Moderna Sars-Covid-2 Vaccination 03/28/2019, 04/24/2019, 11/20/2019, 08/06/2020   Pneumococcal Conjugate-13 01/31/2017   Pneumococcal Polysaccharide-23 01/11/2007, 09/11/2007, 10/30/2017   Tdap 12/21/2012   Zoster Recombinat (Shingrix) 10/01/2018   Zoster, Live 02/05/2019    TDAP status: Up to date  Flu Vaccine status: Up to date  Pneumococcal vaccine status: Up to date  Covid-19 vaccine status: Completed vaccines  Qualifies for Shingles Vaccine? Yes   Zostavax completed Yes   Shingrix Completed?: Yes  Screening Tests Health Maintenance  Topic Date Due   Lung Cancer Screening  02/11/2022   DEXA SCAN  02/03/2023 (Originally 12/30/2021)   MAMMOGRAM  03/16/2022   Pneumonia Vaccine 53+ Years old (3 - PPSV23 or PCV20) 10/31/2022   DTaP/Tdap/Td (2 - Td or Tdap) 12/22/2022   Medicare Annual Wellness (AWV)  02/03/2023   COLONOSCOPY (Pts 45-99yr Insurance coverage will need to be confirmed)  03/22/2027   INFLUENZA VACCINE  Completed   Hepatitis C Screening  Completed   HIV Screening  Completed   HPV VACCINES  Aged Out   COVID-19 Vaccine  Discontinued   Zoster Vaccines- Shingrix  Discontinued    Health Maintenance  Health Maintenance Due  Topic Date Due   Lung Cancer Screening  02/11/2022    Colorectal cancer screening: Type of screening: Colonoscopy. Completed 03/21/17. Repeat every 5 years  Mammogram status: Completed 03/15/21. Repeat every year  Bone Density status: Ordered Patient deferred. Pt provided with contact info and advised to call to schedule appt.  Lung Cancer Screening: (Low Dose CT Chest recommended if Age 628-80years, 30 pack-year currently smoking OR have  quit w/in 15years.) qualify. Does  Last Completed 02/11/21   Additional Screening:  Hepatitis C Screening: does qualify; Completed 11/23/20  Vision Screening: Recommended annual ophthalmology exams for early detection of glaucoma and other disorders of the eye. Is the patient up to date with their annual eye exam?  Yes  Who is the provider or what is the name of the office in which the patient attends annual eye exams? [email or web address] [facility] is not established with a provider, would they like to be referred to a provider to establish care?  No .   Dental Screening: Recommended annual dental exams for proper oral hygiene  Community Resource Referral / Chronic Care Management:  CRR required this visit?  No   CCM required this visit?  No      Plan:     I have personally reviewed and noted the following in the patient's chart:   Medical and social history Use of alcohol, tobacco or illicit drugs  Current medications and supplements including opioid prescriptions. Patient is currently taking opioid prescriptions. Information provided to patient regarding non-opioid alternatives. Patient advised to discuss non-opioid treatment plan with their provider. Functional ability and status Nutritional status Physical activity Advanced directives List of other physicians Hospitalizations, surgeries, and ER visits in previous 12 months Vitals Screenings to include cognitive, depression, and falls Referrals and appointments  In addition, I have reviewed and discussed with patient certain preventive protocols, quality metrics, and best practice recommendations. A written personalized care plan for preventive services as well as general preventive health recommendations were provided to patient.     Criselda Peaches, LPN   5/57/3220   Nurse Notes:  None

## 2022-02-02 NOTE — Patient Instructions (Addendum)
[name] , Thank you for taking time to come for your Medicare Wellness Visit. I appreciate your ongoing commitment to your health goals. Please review the following plan we discussed and let me know if I can assist you in the future.   These are the goals we discussed:  Goals       Patient Stated (pt-stated)      Drink more water and increase activity as tolerated.        This is a list of the screening recommended for you and due dates:  Health Maintenance  Topic Date Due   Screening for Lung Cancer  02/11/2022   DEXA scan (bone density measurement)  02/03/2023*   Mammogram  03/16/2022   Pneumonia Vaccine (3 - PPSV23 or PCV20) 10/31/2022   DTaP/Tdap/Td vaccine (2 - Td or Tdap) 12/22/2022   Medicare Annual Wellness Visit  02/03/2023   Colon Cancer Screening  03/22/2027   Flu Shot  Completed   Hepatitis C Screening: USPSTF Recommendation to screen - Ages 18-79 yo.  Completed   HIV Screening  Completed   HPV Vaccine  Aged Out   COVID-19 Vaccine  Discontinued   Zoster (Shingles) Vaccine  Discontinued  *Topic was postponed. The date shown is not the original due date.    Advanced directives: Please bring a copy of your health care power of attorney and living will to the office to be added to your chart at your convenience.   Conditions/risks identified: None  Next appointment: Follow up in one year for your annual wellness visit     Preventive Care 65 Years and Older, Female Preventive care refers to lifestyle choices and visits with your health care provider that can promote health and wellness. What does preventive care include? A yearly physical exam. This is also called an annual well check. Dental exams once or twice a year. Routine eye exams. Ask your health care provider how often you should have your eyes checked. Personal lifestyle choices, including: Daily care of your teeth and gums. Regular physical activity. Eating a healthy diet. Avoiding tobacco and  drug use. Limiting alcohol use. Practicing safe sex. Taking low-dose aspirin every day. Taking vitamin and mineral supplements as recommended by your health care provider. What happens during an annual well check? The services and screenings done by your health care provider during your annual well check will depend on your age, overall health, lifestyle risk factors, and family history of disease. Counseling  Your health care provider may ask you questions about your: Alcohol use. Tobacco use. Drug use. Emotional well-being. Home and relationship well-being. Sexual activity. Eating habits. History of falls. Memory and ability to understand (cognition). Work and work Statistician. Reproductive health. Screening  You may have the following tests or measurements: Height, weight, and BMI. Blood pressure. Lipid and cholesterol levels. These may be checked every 5 years, or more frequently if you are over 8 years old. Skin check. Lung cancer screening. You may have this screening every year starting at age 74 if you have a 30-pack-year history of smoking and currently smoke or have quit within the past 15 years. Fecal occult blood test (FOBT) of the stool. You may have this test every year starting at age 63. Flexible sigmoidoscopy or colonoscopy. You may have a sigmoidoscopy every 5 years or a colonoscopy every 10 years starting at age 44. Hepatitis C blood test. Hepatitis B blood test. Sexually transmitted disease (STD) testing. Diabetes screening. This is done by checking your blood  sugar (glucose) after you have not eaten for a while (fasting). You may have this done every 1-3 years. Bone density scan. This is done to screen for osteoporosis. You may have this done starting at age 53. Mammogram. This may be done every 1-2 years. Talk to your health care provider about how often you should have regular mammograms. Talk with your health care provider about your test results, treatment  options, and if necessary, the need for more tests. Vaccines  Your health care provider may recommend certain vaccines, such as: Influenza vaccine. This is recommended every year. Tetanus, diphtheria, and acellular pertussis (Tdap, Td) vaccine. You may need a Td booster every 10 years. Zoster vaccine. You may need this after age 68. Pneumococcal 13-valent conjugate (PCV13) vaccine. One dose is recommended after age 49. Pneumococcal polysaccharide (PPSV23) vaccine. One dose is recommended after age 9. Talk to your health care provider about which screenings and vaccines you need and how often you need them. This information is not intended to replace advice given to you by your health care provider. Make sure you discuss any questions you have with your health care provider. Document Released: 01/23/2015 Document Revised: 09/16/2015 Document Reviewed: 10/28/2014 Elsevier Interactive Patient Education  2017 [place] Prevention in the Home Falls can cause injuries. They can happen to people of all ages. There are many things you can do to make your home safe and to help prevent falls. What can I do on the outside of my home? Regularly fix the edges of walkways and driveways and fix any cracks. Remove anything that might make you trip as you walk through a door, such as a raised step or threshold. Trim any bushes or trees on the path to your home. Use bright outdoor lighting. Clear any walking paths of anything that might make someone trip, such as rocks or tools. Regularly check to see if handrails are loose or broken. Make sure that both sides of any steps have handrails. Any raised decks and porches should have guardrails on the edges. Have any leaves, snow, or ice cleared regularly. Use sand or salt on walking paths during winter. Clean up any spills in your garage right away. This includes oil or grease spills. What can I do in the bathroom? Use night lights. Install grab  bars by the toilet and in the tub and shower. Do not use towel bars as grab bars. Use non-skid mats or decals in the tub or shower. If you need to sit down in the shower, use a plastic, non-slip stool. Keep the floor dry. Clean up any water that spills on the floor as soon as it happens. Remove soap buildup in the tub or shower regularly. Attach bath mats securely with double-sided non-slip rug tape. Do not have throw rugs and other things on the floor that can make you trip. What can I do in the bedroom? Use night lights. Make sure that you have a light by your bed that is easy to reach. Do not use any sheets or blankets that are too big for your bed. They should not hang down onto the floor. Have a firm chair that has side arms. You can use this for support while you get dressed. Do not have throw rugs and other things on the floor that can make you trip. What can I do in the kitchen? Clean up any spills right away. Avoid walking on wet floors. Keep items that you use a lot in easy-to-reach  places. If you need to reach something above you, use a strong step stool that has a grab bar. Keep electrical cords out of the way. Do not use floor polish or wax that makes floors slippery. If you must use wax, use non-skid floor wax. Do not have throw rugs and other things on the floor that can make you trip. What can I do with my stairs? Do not leave any items on the stairs. Make sure that there are handrails on both sides of the stairs and use them. Fix handrails that are broken or loose. Make sure that handrails are as long as the stairways. Check any carpeting to make sure that it is firmly attached to the stairs. Fix any carpet that is loose or worn. Avoid having throw rugs at the top or bottom of the stairs. If you do have throw rugs, attach them to the floor with carpet tape. Make sure that you have a light switch at the top of the stairs and the bottom of the stairs. If you do not have them,  ask someone to add them for you. What else can I do to help prevent falls? Wear shoes that: Do not have high heels. Have rubber bottoms. Are comfortable and fit you well. Are closed at the toe. Do not wear sandals. If you use a stepladder: Make sure that it is fully opened. Do not climb a closed stepladder. Make sure that both sides of the stepladder are locked into place. Ask someone to hold it for you, if possible. Clearly mark and make sure that you can see: Any grab bars or handrails. First and last steps. Where the edge of each step is. Use tools that help you move around (mobility aids) if they are needed. These include: Canes. Walkers. Scooters. Crutches. Turn on the lights when you go into a dark area. Replace any light bulbs as soon as they burn out. Set up your furniture so you have a clear path. Avoid moving your furniture around. If any of your floors are uneven, fix them. If there are any pets around you, be aware of where they are. Review your medicines with your doctor. Some medicines can make you feel dizzy. This can increase your chance of falling. Ask your doctor what other things that you can do to help prevent falls. This information is not intended to replace advice given to you by your health care provider. Make sure you discuss any questions you have with your health care provider. Document Released: 10/23/2008 Document Revised: 06/04/2015 Document Reviewed: 01/31/2014 Elsevier Interactive Patient Education  2017 [employer].

## 2022-02-11 ENCOUNTER — Inpatient Hospital Stay: Payer: Medicare Other | Attending: Hematology & Oncology

## 2022-02-11 ENCOUNTER — Encounter: Payer: Self-pay | Admitting: Hematology & Oncology

## 2022-02-11 ENCOUNTER — Inpatient Hospital Stay (HOSPITAL_BASED_OUTPATIENT_CLINIC_OR_DEPARTMENT_OTHER): Payer: Medicare Other | Admitting: Hematology & Oncology

## 2022-02-11 VITALS — BP 118/55 | HR 78 | Temp 98.6°F | Resp 20 | Ht 62.0 in | Wt 132.0 lb

## 2022-02-11 DIAGNOSIS — Z85820 Personal history of malignant melanoma of skin: Secondary | ICD-10-CM | POA: Diagnosis present

## 2022-02-11 DIAGNOSIS — C6931 Malignant neoplasm of right choroid: Secondary | ICD-10-CM | POA: Diagnosis not present

## 2022-02-11 DIAGNOSIS — J449 Chronic obstructive pulmonary disease, unspecified: Secondary | ICD-10-CM | POA: Insufficient documentation

## 2022-02-11 LAB — CBC WITH DIFFERENTIAL (CANCER CENTER ONLY)
Abs Immature Granulocytes: 0.02 10*3/uL (ref 0.00–0.07)
Basophils Absolute: 0.1 10*3/uL (ref 0.0–0.1)
Basophils Relative: 2 %
Eosinophils Absolute: 0.3 10*3/uL (ref 0.0–0.5)
Eosinophils Relative: 3 %
HCT: 43.3 % (ref 36.0–46.0)
Hemoglobin: 14.2 g/dL (ref 12.0–15.0)
Immature Granulocytes: 0 %
Lymphocytes Relative: 25 %
Lymphs Abs: 2 10*3/uL (ref 0.7–4.0)
MCH: 32.6 pg (ref 26.0–34.0)
MCHC: 32.8 g/dL (ref 30.0–36.0)
MCV: 99.5 fL (ref 80.0–100.0)
Monocytes Absolute: 0.8 10*3/uL (ref 0.1–1.0)
Monocytes Relative: 10 %
Neutro Abs: 5 10*3/uL (ref 1.7–7.7)
Neutrophils Relative %: 60 %
Platelet Count: 295 10*3/uL (ref 150–400)
RBC: 4.35 MIL/uL (ref 3.87–5.11)
RDW: 11.6 % (ref 11.5–15.5)
WBC Count: 8.2 10*3/uL (ref 4.0–10.5)
nRBC: 0 % (ref 0.0–0.2)

## 2022-02-11 LAB — CMP (CANCER CENTER ONLY)
ALT: 25 U/L (ref 0–44)
AST: 19 U/L (ref 15–41)
Albumin: 4.3 g/dL (ref 3.5–5.0)
Alkaline Phosphatase: 84 U/L (ref 38–126)
Anion gap: 12 (ref 5–15)
BUN: 20 mg/dL (ref 8–23)
CO2: 33 mmol/L — ABNORMAL HIGH (ref 22–32)
Calcium: 10.4 mg/dL — ABNORMAL HIGH (ref 8.9–10.3)
Chloride: 101 mmol/L (ref 98–111)
Creatinine: 0.84 mg/dL (ref 0.44–1.00)
GFR, Estimated: >60 mL/min (ref >60)
Glucose, Bld: 140 mg/dL — ABNORMAL HIGH (ref 70–99)
Potassium: 4.9 mmol/L (ref 3.5–5.1)
Sodium: 146 mmol/L — ABNORMAL HIGH (ref 135–145)
Total Bilirubin: 0.5 mg/dL (ref 0.3–1.2)
Total Protein: 7 g/dL (ref 6.5–8.1)

## 2022-02-11 LAB — LACTATE DEHYDROGENASE: LDH: 157 U/L (ref 98–192)

## 2022-02-11 NOTE — Progress Notes (Signed)
Hematology and Oncology Follow Up Visit  Carol Quinn [id number] 15-Aug-1955 67 y.o. 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  We last saw her back in August.  Since then, she been doing pretty well.  She had her last CT scan done on 08/11/2021.  This did not show any evidence of recurrent disease.  She does have underlying COPD.  The cool winter weather does seem to help her.  She has had little bit of abdominal discomfort.  She will have a upper and lower endoscopy in April.  She has had no fever.  She has had no problems with COVID.  There is been no obvious change in bowel or bladder habits.  She has had no headache.  She has had no leg swelling.  There is been no rashes.  She has had no bleeding.  Overall, her performance status is ECOG 1.    Medications:  Allergies as of 02/11/2022       Reactions   Clarithromycin Diarrhea, Rash, Other (See Comments)   Hydrocodone Bit-homatrop Mbr Other (See Comments)   paranoia   Hydrocodone Bit-homatrop Mbr Other (See Comments)   paranoia   Codeine Itching   Penicillins Other (See Comments)   Has patient had a PCN reaction causing immediate rash, facial/tongue/throat swelling, SOB or lightheadedness with hypotension: Unknown Has patient had a PCN reaction causing severe rash involving mucus membranes or skin necrosis: Unknown Has patient had a PCN reaction that required hospitalization: Unknown Has patient had a PCN reaction occurring within the last 10 years: No If all of the above answers are "NO", then may proceed with Cephalosporin use.   Topiramate Other (See Comments)   Other reaction(s): WEIGHT LOSS        Medication List        Accurate as of February 11, 2022  8:51 AM. If you  have any questions, ask your nurse or doctor.          STOP taking these medications    varenicline 1 MG tablet Commonly known as: Chantix Continuing Month Pak Stopped by: [name], MD       TAKE these medications    albuterol (2.5 MG/3ML) 0.083% nebulizer solution Commonly known as: PROVENTIL Take 3 mLs (2.5 mg total) by nebulization every 6 (six) hours as needed for wheezing or shortness of breath.   albuterol 108 (90 Base) MCG/ACT inhaler Commonly known as: VENTOLIN HFA Inhale 2 puffs into the lungs every 6 (six) hours as needed for wheezing or shortness of breath.   aspirin EC 81 MG tablet Take 81 mg by mouth daily. Swallow whole.   atorvastatin 40 MG tablet Commonly known as: LIPITOR Take 1 tablet (40 mg total) by mouth daily.   buPROPion 150 MG 24 hr tablet Commonly known as: WELLBUTRIN XL Take 1 tablet (150 mg total) by mouth every morning.   Epinastine HCl 0.05 % ophthalmic solution Place 1 drop into the left eye daily as needed.   escitalopram 20 MG tablet Commonly known as: LEXAPRO Take 1 tablet (20 mg total) by mouth daily.   estradiol 0.0375 MG/24HR Commonly known as: VIVELLE-DOT Place onto the skin 2 (two) times a week.   fexofenadine 180 MG tablet Commonly known as: ALLEGRA Take 180 mg by mouth daily.  fluticasone 50 MCG/ACT nasal spray Commonly known as: FLONASE Place into the nose.   fluticasone 50 MCG/ACT nasal spray Commonly known as: FLONASE Place into the nose.   Flutter Devi Use as directed   modafinil 200 MG tablet Commonly known as: PROVIGIL Take 1 tablet (200 mg total) by mouth daily.   montelukast 10 MG tablet Commonly known as: SINGULAIR Take 1 tablet (10 mg total) by mouth at bedtime.   naphazoline-pheniramine 0.025-0.3 % ophthalmic solution Commonly known as: NAPHCON-A Place into both eyes.   omeprazole 40 MG capsule Commonly known as: PRILOSEC Take 1 capsule (40 mg total) by mouth daily.   Plenvu 140 g  Solr Generic drug: PEG-KCl-NaCl-NaSulf-Na Asc-C Take 1 kit by mouth as directed. Use coupon: BIN: [id number] PNC: CNRX Group: [id number] ID: [id number]   predniSONE 10 MG tablet Commonly known as: DELTASONE Take 4 tabs for 2 days, then 3 tabs for 2 days, then 2 tabs for 2 days, then 1 tab for 2 days, then stop.   PreviDent 5000 Booster Plus 1.1 % Pste Generic drug: Sodium Fluoride   Systane 0.4-0.3 % Soln Generic drug: Polyethyl Glycol-Propyl Glycol Apply 1 drop to eye 4 (four) times daily.   traMADol 50 MG tablet Commonly known as: ULTRAM 1 or 2 every 6 hours if needed for cough   Trelegy Ellipta 100-62.5-25 MCG/ACT Aepb Generic drug: Fluticasone-Umeclidin-Vilant Inhale 1 puff into the lungs daily.   valsartan-hydrochlorothiazide 160-25 MG tablet Commonly known as: DIOVAN-HCT Take 1 tablet by mouth daily.        Allergies:  Allergies  Allergen Reactions   Clarithromycin Diarrhea, Rash and Other (See Comments)   Hydrocodone Bit-Homatrop Mbr Other (See Comments)    paranoia   Hydrocodone Bit-Homatrop Mbr Other (See Comments)    paranoia   Codeine Itching   Penicillins Other (See Comments)     Has patient had a PCN reaction causing immediate rash, facial/tongue/throat swelling, SOB or lightheadedness with hypotension: Unknown Has patient had a PCN reaction causing severe rash involving mucus membranes or skin necrosis: Unknown Has patient had a PCN reaction that required hospitalization: Unknown Has patient had a PCN reaction occurring within the last 10 years: No If all of the above answers are "NO", then may proceed with Cephalosporin use.    Topiramate Other (See Comments)    Other reaction(s): WEIGHT LOSS     Past Medical History, Surgical history, Social history, and Family History were reviewed and updated.  Review of Systems: Review of Systems  Constitutional: Negative.   HENT: Negative.    Eyes: Negative.   Respiratory: Negative.    Cardiovascular:  Negative.   Gastrointestinal:  Positive for abdominal pain.  Genitourinary: Negative.   Musculoskeletal: Negative.   Skin: Negative.   Neurological: Negative.   Endo/Heme/Allergies: Negative.   Psychiatric/Behavioral: Negative.        Physical Exam:  height is '5\' 2"'$  (1.575 m) and weight is 132 lb (59.9 kg). Her oral temperature is 98.6 F (37 C). Her blood pressure is 118/55 (abnormal) and her pulse is 78. Her respiration is 20 and oxygen saturation is 95%.   Wt Readings from Last 3 Encounters:  02/11/22 132 lb (59.9 kg)  02/02/22 132 lb (59.9 kg)  01/21/22 132 lb (59.9 kg)    Physical Exam Vitals reviewed.  HENT:     Head: Normocephalic and atraumatic.  Eyes:     Pupils: Pupils are equal, round, and reactive to light.     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  Left eye is unremarkable.  She has good extraocular muscle movement of the left eye.  Cardiovascular:     Rate and Rhythm: Normal rate and regular rhythm.     Heart sounds: Normal heart sounds.  Pulmonary:     Effort: Pulmonary effort is normal.     Breath sounds: Normal breath sounds.  Abdominal:     General: Bowel sounds are normal.     Palpations: Abdomen is soft.  Musculoskeletal:        General: No tenderness or deformity. Normal range of motion.     Cervical back: Normal range of motion.  Lymphadenopathy:     Cervical: No cervical adenopathy.  Skin:    General: Skin is warm and dry.     Findings: No erythema or rash.  Neurological:     Mental Status: She is alert and oriented to person, place, and time.  Psychiatric:        Behavior: Behavior normal.        Thought Content: Thought content normal.        Judgment: Judgment normal.      Lab Results  Component Value Date   WBC 8.2 02/11/2022   HGB 14.2 02/11/2022   HCT 43.3 02/11/2022   MCV 99.5 02/11/2022   PLT 295 02/11/2022   No results found for: "FERRITIN", "IRON", "TIBC", "UIBC", "IRONPCTSAT" Lab Results  Component  Value Date   RBC 4.35 02/11/2022   No results found for: "KPAFRELGTCHN", "LAMBDASER", "KAPLAMBRATIO" No results found for: "IGGSERUM", "IGA", "IGMSERUM" No results found for: "TOTALPROTELP", "ALBUMINELP", "A1GS", "A2GS", "BETS", "BETA2SER", "GAMS", "MSPIKE", "SPEI"   Chemistry      Component Value Date/Time   NA 146 (H) 02/11/2022 0751   NA 143 12/23/2016 0848   K 4.9 02/11/2022 0751   K 4.3 12/23/2016 0848   CL 101 02/11/2022 0751   CL 99 12/23/2016 0848   CO2 33 (H) 02/11/2022 0751   CO2 31 12/23/2016 0848   BUN 20 02/11/2022 0751   BUN 10 12/23/2016 0848   CREATININE 0.84 02/11/2022 0751   CREATININE 1.0 12/23/2016 0848      Component Value Date/Time   CALCIUM 10.4 (H) 02/11/2022 0751   CALCIUM 9.4 12/23/2016 0848   ALKPHOS 84 02/11/2022 0751   ALKPHOS 99 (H) 12/23/2016 0848   AST 19 02/11/2022 0751   ALT 25 02/11/2022 0751   ALT 26 12/23/2016 0848   BILITOT 0.5 02/11/2022 0751      Impression and Plan: Carol Quinn is a very pleasant 67 yo caucasian female with a choroidal melanoma of the right eye with right eye enucleation in October 2017.   So far, everything looks fantastic.  She has had no problems with recurrence of the choroidal melanoma.  Since her labs look okay, I do not feel that we have to do any scans on her.  Will plan to get her back in 6 more months.  [name], MD 2/2/20248:51 AM

## 2022-02-16 ENCOUNTER — Other Ambulatory Visit (HOSPITAL_BASED_OUTPATIENT_CLINIC_OR_DEPARTMENT_OTHER): Payer: Self-pay | Admitting: Obstetrics and Gynecology

## 2022-02-16 DIAGNOSIS — Z1231 Encounter for screening mammogram for malignant neoplasm of breast: Secondary | ICD-10-CM

## 2022-03-06 ENCOUNTER — Other Ambulatory Visit: Payer: Self-pay | Admitting: Family Medicine

## 2022-03-15 ENCOUNTER — Encounter: Payer: Self-pay | Admitting: Internal Medicine

## 2022-03-15 ENCOUNTER — Encounter (HOSPITAL_COMMUNITY): Payer: Self-pay | Admitting: Internal Medicine

## 2022-03-16 ENCOUNTER — Other Ambulatory Visit: Payer: Self-pay | Admitting: Internal Medicine

## 2022-03-21 ENCOUNTER — Encounter (HOSPITAL_BASED_OUTPATIENT_CLINIC_OR_DEPARTMENT_OTHER): Payer: Self-pay

## 2022-03-21 ENCOUNTER — Ambulatory Visit (HOSPITAL_BASED_OUTPATIENT_CLINIC_OR_DEPARTMENT_OTHER)
Admission: RE | Admit: 2022-03-21 | Discharge: 2022-03-21 | Disposition: A | Discharge status: Home / Self Care | Payer: Medicare Other | Source: Ambulatory Visit | Attending: Obstetrics and Gynecology | Admitting: Obstetrics and Gynecology

## 2022-03-21 DIAGNOSIS — Z1231 Encounter for screening mammogram for malignant neoplasm of breast: Secondary | ICD-10-CM | POA: Diagnosis not present

## 2022-03-21 NOTE — Anesthesia Preprocedure Evaluation (Signed)
Anesthesia Evaluation  Patient identified by MRN, date of birth, ID band Patient awake    Reviewed: Allergy & Precautions, NPO status , Patient's Chart, lab work & pertinent test results  Airway Mallampati: II  TM Distance: >3 FB Neck ROM: Full    Dental  (+) Dental Advisory Given, Teeth Intact   Pulmonary COPD,  oxygen dependent, former smoker   Pulmonary exam normal breath sounds clear to auscultation       Cardiovascular hypertension, Pt. on medications Normal cardiovascular exam+ dysrhythmias  Rhythm:Regular Rate:Normal  RBBB   Neuro/Psych  Headaches PSYCHIATRIC DISORDERS Anxiety Depression     Neuromuscular disease    GI/Hepatic Neg liver ROS, hiatal hernia,GERD  ,,  Endo/Other  negative endocrine ROS    Renal/GU negative Renal ROS     Musculoskeletal negative musculoskeletal ROS (+)    Abdominal   Peds  Hematology negative hematology ROS (+)   Anesthesia Other Findings   Reproductive/Obstetrics negative OB ROS                             Anesthesia Physical Anesthesia Plan  ASA: 3  Anesthesia Plan: MAC   Post-op Pain Management: Minimal or no pain anticipated   Induction:   PONV Risk Score and Plan: 2 and Treatment may vary due to age or medical condition, TIVA and Propofol infusion  Airway Management Planned: Natural Airway  Additional Equipment:   Intra-op Plan:   Post-operative Plan:   Informed Consent:   Plan Discussed with:   Anesthesia Plan Comments:         Anesthesia Quick Evaluation

## 2022-03-22 ENCOUNTER — Ambulatory Visit (HOSPITAL_COMMUNITY)
Admission: RE | Admit: 2022-03-22 | Discharge: 2022-03-22 | Disposition: A | Discharge status: Home / Self Care | Payer: Medicare Other | Attending: Internal Medicine | Admitting: Internal Medicine

## 2022-03-22 ENCOUNTER — Other Ambulatory Visit: Payer: Self-pay

## 2022-03-22 ENCOUNTER — Encounter (HOSPITAL_COMMUNITY): Payer: Self-pay | Admitting: Internal Medicine

## 2022-03-22 ENCOUNTER — Ambulatory Visit (HOSPITAL_BASED_OUTPATIENT_CLINIC_OR_DEPARTMENT_OTHER): Payer: Medicare Other | Admitting: Anesthesiology

## 2022-03-22 ENCOUNTER — Ambulatory Visit (HOSPITAL_COMMUNITY): Payer: Medicare Other | Admitting: Anesthesiology

## 2022-03-22 ENCOUNTER — Encounter (HOSPITAL_COMMUNITY)
Admission: RE | Disposition: A | Discharge status: Home / Self Care | Payer: Self-pay | Source: Home / Self Care | Attending: Internal Medicine

## 2022-03-22 DIAGNOSIS — K635 Polyp of colon: Secondary | ICD-10-CM | POA: Diagnosis not present

## 2022-03-22 DIAGNOSIS — R933 Abnormal findings on diagnostic imaging of other parts of digestive tract: Secondary | ICD-10-CM | POA: Diagnosis not present

## 2022-03-22 DIAGNOSIS — R519 Headache, unspecified: Secondary | ICD-10-CM | POA: Insufficient documentation

## 2022-03-22 DIAGNOSIS — I10 Essential (primary) hypertension: Secondary | ICD-10-CM | POA: Diagnosis not present

## 2022-03-22 DIAGNOSIS — K5730 Diverticulosis of large intestine without perforation or abscess without bleeding: Secondary | ICD-10-CM | POA: Diagnosis not present

## 2022-03-22 DIAGNOSIS — Z86010 Personal history of colonic polyps: Secondary | ICD-10-CM

## 2022-03-22 DIAGNOSIS — Z79899 Other long term (current) drug therapy: Secondary | ICD-10-CM | POA: Insufficient documentation

## 2022-03-22 DIAGNOSIS — F32A Depression, unspecified: Secondary | ICD-10-CM | POA: Diagnosis not present

## 2022-03-22 DIAGNOSIS — R6881 Early satiety: Secondary | ICD-10-CM | POA: Diagnosis not present

## 2022-03-22 DIAGNOSIS — R9389 Abnormal findings on diagnostic imaging of other specified body structures: Secondary | ICD-10-CM

## 2022-03-22 DIAGNOSIS — D126 Benign neoplasm of colon, unspecified: Secondary | ICD-10-CM | POA: Diagnosis not present

## 2022-03-22 DIAGNOSIS — F419 Anxiety disorder, unspecified: Secondary | ICD-10-CM | POA: Diagnosis not present

## 2022-03-22 DIAGNOSIS — D123 Benign neoplasm of transverse colon: Secondary | ICD-10-CM

## 2022-03-22 DIAGNOSIS — Z1211 Encounter for screening for malignant neoplasm of colon: Secondary | ICD-10-CM

## 2022-03-22 DIAGNOSIS — K449 Diaphragmatic hernia without obstruction or gangrene: Secondary | ICD-10-CM | POA: Diagnosis not present

## 2022-03-22 DIAGNOSIS — K219 Gastro-esophageal reflux disease without esophagitis: Secondary | ICD-10-CM | POA: Diagnosis not present

## 2022-03-22 DIAGNOSIS — Z85840 Personal history of malignant neoplasm of eye: Secondary | ICD-10-CM | POA: Insufficient documentation

## 2022-03-22 DIAGNOSIS — R110 Nausea: Secondary | ICD-10-CM | POA: Diagnosis not present

## 2022-03-22 DIAGNOSIS — Z09 Encounter for follow-up examination after completed treatment for conditions other than malignant neoplasm: Secondary | ICD-10-CM | POA: Diagnosis not present

## 2022-03-22 DIAGNOSIS — D122 Benign neoplasm of ascending colon: Secondary | ICD-10-CM | POA: Diagnosis not present

## 2022-03-22 DIAGNOSIS — R1013 Epigastric pain: Secondary | ICD-10-CM

## 2022-03-22 DIAGNOSIS — Z9981 Dependence on supplemental oxygen: Secondary | ICD-10-CM | POA: Insufficient documentation

## 2022-03-22 DIAGNOSIS — R140 Abdominal distension (gaseous): Secondary | ICD-10-CM | POA: Diagnosis not present

## 2022-03-22 DIAGNOSIS — Z87891 Personal history of nicotine dependence: Secondary | ICD-10-CM | POA: Diagnosis not present

## 2022-03-22 DIAGNOSIS — J439 Emphysema, unspecified: Secondary | ICD-10-CM | POA: Insufficient documentation

## 2022-03-22 DIAGNOSIS — K3189 Other diseases of stomach and duodenum: Secondary | ICD-10-CM | POA: Diagnosis not present

## 2022-03-22 DIAGNOSIS — K5790 Diverticulosis of intestine, part unspecified, without perforation or abscess without bleeding: Secondary | ICD-10-CM | POA: Diagnosis not present

## 2022-03-22 DIAGNOSIS — R141 Gas pain: Secondary | ICD-10-CM

## 2022-03-22 DIAGNOSIS — D120 Benign neoplasm of cecum: Secondary | ICD-10-CM | POA: Diagnosis not present

## 2022-03-22 DIAGNOSIS — D125 Benign neoplasm of sigmoid colon: Secondary | ICD-10-CM | POA: Diagnosis not present

## 2022-03-22 DIAGNOSIS — J449 Chronic obstructive pulmonary disease, unspecified: Secondary | ICD-10-CM

## 2022-03-22 DIAGNOSIS — R1010 Upper abdominal pain, unspecified: Secondary | ICD-10-CM | POA: Diagnosis present

## 2022-03-22 HISTORY — PX: BIOPSY: SHX5522

## 2022-03-22 HISTORY — PX: ESOPHAGOGASTRODUODENOSCOPY (EGD) WITH PROPOFOL: SHX5813

## 2022-03-22 HISTORY — PX: POLYPECTOMY: SHX5525

## 2022-03-22 HISTORY — PX: COLONOSCOPY WITH PROPOFOL: SHX5780

## 2022-03-22 SURGERY — ESOPHAGOGASTRODUODENOSCOPY (EGD) WITH PROPOFOL
Anesthesia: Monitor Anesthesia Care

## 2022-03-22 MED ORDER — PROPOFOL 10 MG/ML IV BOLUS
INTRAVENOUS | Status: DC | PRN
  Administered 2022-03-22: 20 mg via INTRAVENOUS
  Administered 2022-03-22: 30 mg via INTRAVENOUS
  Administered 2022-03-22: 40 mg via INTRAVENOUS
  Administered 2022-03-22: 30 mg via INTRAVENOUS

## 2022-03-22 MED ORDER — LACTATED RINGERS IV SOLN
INTRAVENOUS | Status: DC
  Administered 2022-03-22: 1000 mL via INTRAVENOUS

## 2022-03-22 MED ORDER — SODIUM CHLORIDE 0.9 % IV SOLN: INTRAVENOUS | Status: DC

## 2022-03-22 MED ORDER — PROPOFOL 500 MG/50ML IV EMUL
INTRAVENOUS | Status: DC | PRN
  Administered 2022-03-22: 150 ug/kg/min via INTRAVENOUS

## 2022-03-22 MED ORDER — LIDOCAINE HCL (CARDIAC) PF 100 MG/5ML IV SOSY
PREFILLED_SYRINGE | INTRAVENOUS | Status: DC | PRN
  Administered 2022-03-22: 60 mg via INTRAVENOUS

## 2022-03-22 SURGICAL SUPPLY — 25 items

## 2022-03-22 NOTE — H&P (Signed)
GASTROENTEROLOGY PROCEDURE H&P NOTE   Primary Care Physician: Midge Minium, MD    Reason for Procedure:   Upper abd pain, nausea, early satiety, abnormal CT of the stomach, history of ocular melanoma, history of multiple adenomatous and SSPs  Plan:    Upper and lower endoscopy  Patient is appropriate for endoscopic procedure(s) in the ambulatory ([place]) setting.  The nature of the procedure, as well as the risks, benefits, and alternatives were carefully and thoroughly reviewed with the patient. Ample time for discussion and questions allowed. The patient understood, was satisfied, and agreed to proceed.     HPI: Carol Quinn is a 67 y.o. female who presents for EGD and colonoscopy.  Medical history as below.  Tolerated the prep.  No recent chest pain or shortness of breath.  No abdominal pain today.  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   Oxygen deficiency    RBBB (right bundle branch block with left anterior fascicular block)    Tubular adenoma of colon     Past Surgical History:  Procedure Laterality Date   BREAST BIOPSY Left 2002   COLONOSCOPY WITH PROPOFOL N/A 03/21/2017   Procedure: COLONOSCOPY WITH PROPOFOL;  Surgeon: Jerene Bears, MD;  Location: [facility];  Service: Gastroenterology;  Laterality: N/A;   NOSE SURGERY     "nasal deviation"   TOTAL ABDOMINAL HYSTERECTOMY     TRIGGER FINGER  RELEASE Right 06/05/2018   Procedure: RIGHT THUMB RELEASE TRIGGER FINGER/A-1 PULLEY;  Surgeon: Daryll Brod, MD;  Location: [facility];  Service: Orthopedics;  Laterality: Right;   TUBAL LIGATION      Prior to Admission medications   Medication Sig Start Date End Date Taking? Authorizing Provider  aspirin EC 81 MG tablet Take 81 mg by mouth daily. Swallow whole.   Yes [provider]  aspirin-acetaminophen-caffeine (EXCEDRIN MIGRAINE) [phone] MG tablet Take 2 tablets by mouth every 6 (six) hours as needed for headache.   Yes [provider]  atorvastatin (LIPITOR) 40 MG tablet Take 1 tablet (40 mg total) by mouth daily. 05/18/21  Yes Minus Breeding, MD  buPROPion (WELLBUTRIN XL) 150 MG 24 hr tablet Take 1 tablet (150 mg total) by mouth every morning. 01/11/22  Yes Midge Minium, MD  escitalopram (LEXAPRO) 20 MG tablet Take 1 tablet (20 mg total) by mouth daily. 03/07/22  Yes Midge Minium, MD  estradiol (VIVELLE-DOT) 0.0375 MG/24HR Place 1 patch onto the skin 2 (two) times a week. 02/04/20  Yes [provider]  fexofenadine (ALLEGRA) 180 MG tablet Take 180 mg by mouth daily.   Yes [provider]  Fluticasone-Umeclidin-Vilant (TRELEGY ELLIPTA) 100-62.5-25 MCG/ACT AEPB Inhale 1 puff into the lungs daily. 11/08/21  Yes Deneise Lever, MD  modafinil (  PROVIGIL) 200 MG tablet Take 1 tablet (200 mg total) by mouth daily. 11/08/21  Yes Young, Tarri Fuller D, MD  montelukast (SINGULAIR) 10 MG tablet Take 1 tablet (10 mg total) by mouth at bedtime. 02/01/22  Yes Midge Minium, MD  Multiple Vitamins-Minerals (MULTIVITAMIN WITH MINERALS) tablet Take 1 tablet by mouth daily.   Yes [provider]  omeprazole (PRILOSEC) 40 MG capsule Take 1 capsule (40 mg total) by mouth daily. 03/16/22  Yes Disney Ruggiero, Lajuan Lines, MD  PEG-KCl-NaCl-NaSulf-Na Asc-C (PLENVU) 140 g SOLR Take 1 kit by mouth as directed. Use coupon: BIN: [id number] PNC: CNRX Group: [id number]  ID: [id number] 01/21/22  Yes Bradley Bostelman, Lajuan Lines, MD  traMADol Veatrice Bourbon) 50 MG tablet 1 or 2 every 6 hours if needed for cough 12/07/21  Yes Young, Clinton D, MD  valsartan-hydrochlorothiazide (DIOVAN-HCT) 160-25 MG tablet Take 1 tablet by mouth daily. 11/30/21  Yes Midge Minium, MD  albuterol (PROVENTIL) (2.5 MG/3ML) 0.083% nebulizer solution Take 3 mLs (2.5 mg total) by nebulization every 6 (six) hours as needed for wheezing or shortness of breath. 04/30/19   Deneise Lever, MD  albuterol (VENTOLIN HFA) 108 (90 Base) MCG/ACT inhaler Inhale 2 puffs into the lungs every 6 (six) hours as needed for wheezing or shortness of breath. 11/08/21   Deneise Lever, MD  Respiratory Therapy Supplies (FLUTTER) DEVI Use as directed 07/23/15   Deneise Lever, MD    Current Facility-Administered Medications  Medication Dose Route Frequency Provider Last Rate Last Admin   0.9 %  sodium chloride infusion   Intravenous Continuous Comfort Iversen, Lajuan Lines, MD       lactated ringers infusion   Intravenous Continuous Delano Scardino, Lajuan Lines, MD 10 mL/hr at 03/22/22 0719 1,000 mL at 03/22/22 0719    Allergies as of 01/21/2022 - Review Complete 01/21/2022  Allergen Reaction Noted   Clarithromycin Diarrhea, Rash, and Other (See Comments) 06/19/2014   Hydrocodone bit-homatrop mbr Other (See Comments) 09/27/2010   Hydrocodone bit-homatrop mbr Other (See Comments) 09/27/2010   Codeine Itching 06/19/2014   Penicillins Other (See Comments) 06/19/2014   Topiramate Other (See Comments) 06/19/2014    Family History  Problem Relation Age of Onset   Emphysema Mother    Heart attack Mother 84       Died age 44   Heart failure Father 70   Asthma Sister    Breast cancer Sister 71   Cancer Sister    Leukemia Brother    Asthma Maternal Uncle    Arthritis Maternal Grandmother    Cancer Maternal Grandmother    Breast cancer Maternal Grandmother    Cancer Other        aunts   Cancer Other        uncles   Colon cancer Other     Esophageal cancer Neg Hx    Stomach cancer Neg Hx    Rectal cancer Neg Hx     Social History   Socioeconomic History   Marital status: Married    Spouse name: Remo Lipps   Number of children: Not on file   Years of education: Not on file   Highest education level: Associate degree: academic program  Occupational History   Occupation: Database administrator    Comment: [employer]  Tobacco Use   Smoking status: Former    Packs/day: 0.50    Years: 40.00    Total pack years: 20.00    Types: Cigarettes    Quit date: 08/2021    Years  since quitting: 0.6    Passive exposure: Current   Smokeless tobacco: Never  Vaping Use   Vaping Use: Former  Substance and Sexual Activity   Alcohol use: Yes    Alcohol/week: 0.0 standard drinks of alcohol    Comment: social   Drug use: No   Sexual activity: Not on file  Other Topics Concern   Not on file  Social History Narrative   Lives with husband.     Social Determinants of Health   Financial Resource Strain: Low Risk  (02/02/2022)   Overall Financial Resource Strain (CARDIA)    Difficulty of Paying Living Expenses: Not hard at all  Food Insecurity: No Food Insecurity (02/02/2022)   Hunger Vital Sign    Worried About Running Out of Food in the Last Year: Never true    Ran Out of Food in the Last Year: Never true  Transportation Needs: No Transportation Needs (02/02/2022)   PRAPARE - Hydrologist (Medical): No    Lack of Transportation (Non-Medical): No  Physical Activity: Inactive (02/02/2022)   Exercise Vital Sign    Days of Exercise per Week: 0 days    Minutes of Exercise per Session: 0 min  Stress: No Stress Concern Present (02/02/2022)   [place]    Feeling of Stress : Not at all  Social Connections: [place] (02/02/2022)   Social Connection and Isolation Panel [NHANES]    Frequency of Communication with Friends and  Family: More than three times a week    Frequency of Social Gatherings with Friends and Family: More than three times a week    Attends Religious Services: More than 4 times per year    Active Member of [employer] or Organizations: Yes    Attends Music therapist: More than 4 times per year    Marital Status: Married  Human resources officer Violence: Not At Risk (02/02/2022)   Humiliation, Afraid, Rape, and Kick questionnaire    Fear of Current or Ex-Partner: No    Emotionally Abused: No    Physically Abused: No    Sexually Abused: No    Physical Exam: Vital signs in last 24 hours: '@BP'$  (!) 162/78   Pulse 82   Temp 97.9 F (36.6 C) (Tympanic)   Resp (!) 24   Ht '5\' 2"'$  (1.575 m)   Wt 59.9 kg   SpO2 97%   BMI 24.15 kg/m  GEN: NAD EYE: Sclerae anicteric ENT: MMM CV: Non-tachycardic Pulm: CTA b/l GI: Soft, NT/ND NEURO:  Alert & Oriented x 3   Zenovia Jarred, MD [facility]  03/22/2022 7:45 AM

## 2022-03-22 NOTE — Op Note (Signed)
[facility] Patient Name: Carol Quinn Procedure Date: 03/22/2022 MRN: [id number] Attending MD: Jerene Bears , MD, [id number] Date of Birth: October 30, 1955 CSN: [id number] Age: 67 Admit Type: Outpatient Procedure:                Upper GI endoscopy Indications:              Upper abdominal pain, Abnormal CT of the GI tract                            (gastric thickening), Abdominal bloating, Nausea Providers:                Lajuan Lines. Hilarie Fredrickson, MD, Elmer Ramp. Tilden Dome, RN, [employer], Technician, Willa Rough CRNA Referring MD:             Aundra Millet. Birdie Riddle, MD Medicines:                Monitored Anesthesia Care Complications:            No immediate complications. Estimated Blood Loss:     Estimated blood loss was minimal. Procedure:                Pre-Anesthesia Assessment:                           - Prior to the procedure, a History and Physical                            was performed, and patient medications and                            allergies were reviewed. The patient's tolerance of                            previous anesthesia was also reviewed. The risks                            and benefits of the procedure and the sedation                            options and risks were discussed with the patient.                            All questions were answered, and informed consent                            was obtained. Prior Anticoagulants: The patient has                            taken no anticoagulant or antiplatelet agents. ASA                            Grade Assessment: II - A patient with mild systemic  disease. After reviewing the risks and benefits,                            the patient was deemed in satisfactory condition to                            undergo the procedure.                           After obtaining informed consent, the endoscope was                            passed under direct  vision. Throughout the                            procedure, the patient's blood pressure, pulse, and                            oxygen saturations were monitored continuously. The                            [id number] Olympus endoscope was introduced                            through the mouth, and advanced to the third part                            of duodenum. The upper GI endoscopy was                            accomplished without difficulty. The patient                            tolerated the procedure well. Scope In: Scope Out: Findings:      The examined esophagus was normal.      A 2 cm hiatal hernia was present.      The entire examined stomach was normal. Biopsies were taken with a cold       forceps for histology and Helicobacter pylori testing.      The examined duodenum was normal. Biopsies were taken with a cold       forceps for histology. Impression:               - Normal esophagus.                           - 2 cm hiatal hernia.                           - Normal stomach. Biopsied.                           - Normal examined duodenum. Biopsied. Moderate Sedation:      N/A Recommendation:           - Patient has a contact number available for  emergencies. The signs and symptoms of potential                            delayed complications were discussed with the                            patient. Return to normal activities tomorrow.                            Written discharge instructions were provided to the                            patient.                           - Resume previous diet.                           - Continue present medications.                           - Await pathology results.                           - See the other procedure note for documentation of                            additional recommendations. Procedure Code(s):        --- Professional ---                           [phone],  Esophagogastroduodenoscopy, flexible,                            transoral; with biopsy, single or multiple Diagnosis Code(s):        --- Professional ---                           K44.9, Diaphragmatic hernia without obstruction or                            gangrene                           R10.10, Upper abdominal pain, unspecified                           R14.0, Abdominal distension (gaseous)                           R11.0, Nausea                           R93.3, Abnormal findings on diagnostic imaging of                            other parts of digestive tract CPT copyright 2022 American Medical Association. All rights reserved. The  codes documented in this report are preliminary and upon coder review may  be revised to meet current compliance requirements. Jerene Bears, MD 03/22/2022 9:00:05 AM This report has been signed electronically. Number of Addenda: 0

## 2022-03-22 NOTE — Discharge Instructions (Signed)
YOU HAD AN ENDOSCOPIC PROCEDURE TODAY: Refer to the procedure report and other information in the discharge instructions given to you for any specific questions about what was found during the examination. If this information does not answer your questions, please call [place] office at [phone] to clarify.  ° °YOU SHOULD EXPECT: Some feelings of bloating in the abdomen. Passage of more gas than usual. Walking can help get rid of the air that was put into your GI tract during the procedure and reduce the bloating. If you had a lower endoscopy (such as a colonoscopy or flexible sigmoidoscopy) you may notice spotting of blood in your stool or on the toilet paper. Some abdominal soreness may be present for a day or two, also. ° °DIET: Your first meal following the procedure should be a light meal and then it is ok to progress to your normal diet. A half-sandwich or bowl of soup is an example of a good first meal. Heavy or fried foods are harder to digest and may make you feel nauseous or bloated. Drink plenty of fluids but you should avoid alcoholic beverages for 24 hours. If you had a esophageal dilation, please see attached instructions for diet.   ° °ACTIVITY: Your care partner should take you home directly after the procedure. You should plan to take it easy, moving slowly for the rest of the day. You can resume normal activity the day after the procedure however YOU SHOULD NOT DRIVE, use power tools, machinery or perform tasks that involve climbing or major physical exertion for 24 hours (because of the sedation medicines used during the test).  ° °SYMPTOMS TO REPORT IMMEDIATELY: °A gastroenterologist can be reached at any hour. Please call [phone]  for any of the following symptoms:  °Following lower endoscopy (colonoscopy, flexible sigmoidoscopy) °Excessive amounts of blood in the stool  °Significant tenderness, worsening of abdominal pains  °Swelling of the abdomen that is new, acute  °Fever of 100° or  higher  °Following upper endoscopy (EGD, EUS, ERCP, esophageal dilation) °Vomiting of blood or coffee ground material  °New, significant abdominal pain  °New, significant chest pain or pain under the shoulder blades  °Painful or persistently difficult swallowing  °New shortness of breath  °Black, tarry-looking or red, bloody stools ° °FOLLOW UP:  °If any biopsies were taken you will be contacted by phone or by letter within the next 1-3 weeks. Call [phone]  if you have not heard about the biopsies in 3 weeks.  °Please also call with any specific questions about appointments or follow up tests. ° °

## 2022-03-22 NOTE — Op Note (Signed)
[facility] Patient Name: Carol Quinn Procedure Date: 03/22/2022 MRN: [id number] Attending MD: Jerene Bears , MD, [id number] Date of Birth: Dec 23, 1955 CSN: [id number] Age: 67 Admit Type: Outpatient Procedure:                Colonoscopy Indications:              High risk colon cancer surveillance: Personal                            history of sessile serrated colon polyp (less than                            10 mm in size) with no dysplasia, Last colonoscopy:                            March 2019 (TA x 1, SSP x1) Providers:                Lajuan Lines. Hilarie Fredrickson, MD, Tory Emerald, RN, Cherylynn Ridges,                            Technician Referring MD:             Aundra Millet. Birdie Riddle, MD Medicines:                Monitored Anesthesia Care Complications:            No immediate complications. Estimated Blood Loss:     Estimated blood loss was minimal. Procedure:                Pre-Anesthesia Assessment:                           - Prior to the procedure, a History and Physical                            was performed, and patient medications and                            allergies were reviewed. The patient's tolerance of                            previous anesthesia was also reviewed. The risks                            and benefits of the procedure and the sedation                            options and risks were discussed with the patient.                            All questions were answered, and informed consent                            was obtained. Prior Anticoagulants: The patient has  taken no anticoagulant or antiplatelet agents. ASA                            Grade Assessment: II - A patient with mild systemic                            disease. After reviewing the risks and benefits,                            the patient was deemed in satisfactory condition to                            undergo the procedure.                            - Prior to the procedure, a History and Physical                            was performed, and patient medications and                            allergies were reviewed. The patient's tolerance of                            previous anesthesia was also reviewed. The risks                            and benefits of the procedure and the sedation                            options and risks were discussed with the patient.                            All questions were answered, and informed consent                            was obtained. Prior Anticoagulants: The patient has                            taken no anticoagulant or antiplatelet agents. ASA                            Grade Assessment: II - A patient with mild systemic                            disease. After reviewing the risks and benefits,                            the patient was deemed in satisfactory condition to                            undergo the procedure.  After obtaining informed consent, the colonoscope                            was passed under direct vision. Throughout the                            procedure, the patient's blood pressure, pulse, and                            oxygen saturations were monitored continuously. The                            [id number] Olympus colonoscope was                            introduced through the anus and advanced to the                            cecum, identified by appendiceal orifice and                            ileocecal valve. The colonoscopy was performed                            without difficulty. The patient tolerated the                            procedure well. The quality of the bowel                            preparation was good. The ileocecal valve,                            appendiceal orifice, and rectum were photographed. Scope In: 9:06:34 AM Scope Out: 9:29:40 AM Scope Withdrawal Time: 0 hours 19 minutes  14 seconds  Total Procedure Duration: 0 hours 23 minutes 6 seconds  Findings:      The digital rectal exam was normal.      A 2 mm polyp was found in the cecum. The polyp was sessile. The polyp       was removed with a cold biopsy forceps. Resection and retrieval were       complete.      Two sessile polyps were found in the ascending colon. The polyps were 4       to 5 mm in size. These polyps were removed with a cold snare. Resection       and retrieval were complete.      Two sessile polyps were found in the transverse colon. The polyps were 4       to 6 mm in size. These polyps were removed with a cold snare. Resection       and retrieval were complete.      A 5 mm polyp was found in the sigmoid colon. The polyp was sessile. The       polyp was removed with a cold snare. Resection and retrieval were       complete.  Multiple medium-mouthed and small-mouthed diverticula were found in the       sigmoid colon and distal descending colon.      The retroflexed view of the distal rectum and anal verge was normal and       showed no anal or rectal abnormalities. Impression:               - One 2 mm polyp in the cecum, removed with a cold                            biopsy forceps. Resected and retrieved.                           - Two 4 to 5 mm polyps in the ascending colon,                            removed with a cold snare. Resected and retrieved.                           - Two 4 to 6 mm polyps in the transverse colon,                            removed with a cold snare. Resected and retrieved.                           - One 5 mm polyp in the sigmoid colon, removed with                            a cold snare. Resected and retrieved.                           - Moderate diverticulosis in the sigmoid colon and                            in the distal descending colon.                           - The distal rectum and anal verge are normal on                             retroflexion view. Moderate Sedation:      N/A Recommendation:           - Patient has a contact number available for                            emergencies. The signs and symptoms of potential                            delayed complications were discussed with the                            patient. Return to normal activities tomorrow.  Written discharge instructions were provided to the                            patient.                           - Resume previous diet.                           - Continue present medications.                           - Await pathology results.                           - Repeat colonoscopy is recommended for                            surveillance. The colonoscopy date will be                            determined after pathology results from today's                            exam become available for review. Procedure Code(s):        --- Professional ---                           [id number], Colonoscopy, flexible; with removal of                            tumor(s), polyp(s), or other lesion(s) by snare                            technique                           45380, 26, Colonoscopy, flexible; with biopsy,                            single or multiple Diagnosis Code(s):        --- Professional ---                           Z86.010, Personal history of colonic polyps                           D12.0, Benign neoplasm of cecum                           D12.5, Benign neoplasm of sigmoid colon                           D12.2, Benign neoplasm of ascending colon                           D12.3, Benign neoplasm of transverse colon (hepatic  flexure or splenic flexure)                           K57.30, Diverticulosis of large intestine without                            perforation or abscess without bleeding CPT copyright 2022 American Medical Association. All rights reserved. The codes documented in  this report are preliminary and upon coder review may  be revised to meet current compliance requirements. Jerene Bears, MD 03/22/2022 9:41:09 AM This report has been signed electronically. Number of Addenda: 0

## 2022-03-22 NOTE — Transfer of Care (Signed)
Immediate Anesthesia Transfer of Care Note  Patient: Carol Quinn  Procedure(s) Performed: ESOPHAGOGASTRODUODENOSCOPY (EGD) WITH PROPOFOL COLONOSCOPY WITH PROPOFOL BIOPSY POLYPECTOMY  Patient Location: Short Stay  Anesthesia Type:MAC  Level of Consciousness: awake, drowsy, and patient cooperative  Airway & Oxygen Therapy: Patient Spontanous Breathing  Post-op Assessment: Report given to RN, Post -op Vital signs reviewed and stable, and Patient moving all extremities X 4  Post vital signs: Reviewed and stable  Last Vitals:  Vitals Value Taken Time  BP 123/71 03/22/22 0935  Temp 36.6 C 03/22/22 0936  Pulse 87 03/22/22 0937  Resp 28 03/22/22 0937  SpO2 99 % 03/22/22 0937  Vitals shown include unvalidated device data.  Last Pain:  Vitals:   03/22/22 0936  TempSrc: Temporal  PainSc:          Complications: No notable events documented.

## 2022-03-23 ENCOUNTER — Telehealth: Payer: Self-pay | Admitting: Internal Medicine

## 2022-03-23 LAB — SURGICAL PATHOLOGY

## 2022-03-23 NOTE — Telephone Encounter (Signed)
Inbound call from patient stating that she had a procedure with Dr. Hilarie Fredrickson yesterday at [facility]. Patient stated that Dr. Hilarie Fredrickson advised her that he thought she had an  infection and was going to send in a medication to clear it. Patient stated that she went to the pharmacy and she had a prescription for   Prilosec. Patient is requesting a call back to discuss. Please advise.

## 2022-03-23 NOTE — Anesthesia Postprocedure Evaluation (Signed)
Anesthesia Post Note  Patient: Carol Quinn  Procedure(s) Performed: ESOPHAGOGASTRODUODENOSCOPY (EGD) WITH PROPOFOL COLONOSCOPY WITH PROPOFOL BIOPSY POLYPECTOMY     Patient location during evaluation: PACU Anesthesia Type: MAC Level of consciousness: awake and alert Pain management: pain level controlled Vital Signs Assessment: post-procedure vital signs reviewed and stable Respiratory status: spontaneous breathing Cardiovascular status: stable Anesthetic complications: no   No notable events documented.  Last Vitals:  Vitals:   03/22/22 0950 03/22/22 1000  BP: 139/85 (!) 154/74  Pulse: 72 69  Resp: 17 17  Temp:    SpO2: 98% 96%    Last Pain:  Vitals:   03/22/22 0940  TempSrc:   PainSc: 0-No pain                 Nolon Nations

## 2022-03-24 NOTE — Telephone Encounter (Signed)
PT is calling back for an update on infection prescription. She went to the pharmacy and was told she was only prescribed omeprazole and is very upset. Please advise.

## 2022-03-24 NOTE — Telephone Encounter (Signed)
Patient is anxiously requesting to speak to a nurse in regards to medication. Please advise.

## 2022-03-25 ENCOUNTER — Encounter (HOSPITAL_COMMUNITY): Payer: Self-pay | Admitting: Internal Medicine

## 2022-03-28 ENCOUNTER — Encounter: Payer: Self-pay | Admitting: Internal Medicine

## 2022-03-28 NOTE — Telephone Encounter (Signed)
The pt has been advised that she does not have H pylori and abx is not needed.  She will call back with any concerns in the future

## 2022-04-14 ENCOUNTER — Other Ambulatory Visit: Payer: Self-pay | Admitting: Internal Medicine

## 2022-04-14 DIAGNOSIS — Z720 Tobacco use: Secondary | ICD-10-CM

## 2022-04-18 NOTE — Telephone Encounter (Signed)
OK refill Chantix 1 mg at 90 days supply with one refill.  Patient to keep OV with Dr. Maple Hudson 05/09/2022.  Yearly OV October 2024.

## 2022-05-07 NOTE — Progress Notes (Unsigned)
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 Patient would like RX for [facility]ix [facility]. Feels like breathing is worse since last visit. Productive cough with yellow/brown sputum. Worse last couple months and in the morning  She continues to sleep with oxygen at night.  Emphasis today on getting off of cigarettes again.   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 We discussed active chronic bronchitis symptoms and her recent chest CT. CT chest/abd/pelvis 02/11/21- IMPRESSION: 1. No evidence of metastatic disease in the chest, abdomen or pelvis. 2. Coronary artery calcifications, aortic Atherosclerosis (ICD10-I70.0) and Emphysema (ICD10-J43.9).  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 Pt states this has been occurring for 6 months. Has been trying to see ENT no appointments available  Bothersome persistent sense of postnasal drip, throat clearing and retronasal stuffiness over the last 6 months.  Not relieved by antihistamines.  Cough is productive occasionally of scant clear mucus.  Acid blocker controls reflux symptoms.  Occasional frontal or retro-orbital headaches.  ROS-see HPI   + = positive Constitutional:   No-   weight loss, night sweats, fevers, chills, +fatigue, lassitude. HEENT:   No-  headaches, difficulty swallowing, tooth/dental problems, sore throat,       No-  sneezing, itching, ear ache, +nasal congestion, +post nasal drip,  CV:  No-   chest pain, orthopnea, PND, swelling in lower extremities, anasarca, dizziness, palpitations Resp: + shortness of breath with exertion or at rest.                +productive cough,  + non-productive cough,  No- coughing up of blood.              +change in color of mucus.  + wheezing.   Skin: No-   rash or lesions. GI:  No-   heartburn, indigestion, abdominal pain, nausea, vomiting,  GU:  MS:  No-   joint pain or swelling.   Neuro-     nothing unusual Psych:  No- change in mood or affect. No depression or anxiety.  No memory loss.  OBJ- Physical Exam     General- Alert, Oriented, Affect-appropriate,   Skin- clear  Lymphadenopathy- none Head- atraumatic            Eyes- + R prosthesis  Ears- Hearing, canals-normal            Nose- clear, no-Septal dev, mucus, polyps, erosion, perforation             Throat- Mallampati IV , mucosa clear/  drainage- none, tonsils- small residual, +  occasional throat clearing Neck- flexible , trachea midline, no stridor , thyroid nl, carotid no bruit Chest - symmetrical excursion , unlabored           Heart/CV- RRR , no murmur , no gallop  , no rub, nl s1 s2                           - JVD- none , edema- none, stasis changes- none, varices- none           Lung-  +distant/ clear, Wheeze- none, cough-none , dullness-none, rub- none,             Chest wall-  Abd-  Br/ Gen/ Rectal- Not done, not indicated Extrem- cyanosis- none, clubbing, none, atrophy- none, strength- nl Neuro- grossly intact to observation

## 2022-05-09 ENCOUNTER — Ambulatory Visit (INDEPENDENT_AMBULATORY_CARE_PROVIDER_SITE_OTHER): Payer: Medicare Other | Admitting: Internal Medicine

## 2022-05-09 ENCOUNTER — Encounter: Payer: Self-pay | Admitting: Internal Medicine

## 2022-05-09 VITALS — BP 122/70 | HR 79 | Ht 62.0 in | Wt 132.4 lb

## 2022-05-09 DIAGNOSIS — J3089 Other allergic rhinitis: Secondary | ICD-10-CM | POA: Diagnosis not present

## 2022-05-09 DIAGNOSIS — J449 Chronic obstructive pulmonary disease, unspecified: Secondary | ICD-10-CM | POA: Diagnosis not present

## 2022-05-09 DIAGNOSIS — J320 Chronic maxillary sinusitis: Secondary | ICD-10-CM | POA: Diagnosis not present

## 2022-05-09 DIAGNOSIS — J302 Other seasonal allergic rhinitis: Secondary | ICD-10-CM

## 2022-05-09 MED ORDER — MODAFINIL 200 MG PO TABS: 200.0000 mg | ORAL_TABLET | Freq: Every day | ORAL | 5 refills | Status: DC

## 2022-05-09 NOTE — Patient Instructions (Addendum)
Order- limited CT max fac    dx chronic maxillary sinusitis  Suggest otc Saline nasal rinse  (NeilMed or etc)  Suggest- try Sudafed decongestant - have to sign for this at pharmacy counter- not the 24 hour form. Just try taking it in the mornings for a few days to see if it helps  Ok to continue taking an antihistamine like Allegra or Xyzal  Modafinil refilled

## 2022-05-09 NOTE — Assessment & Plan Note (Signed)
Chest symptoms are more or less stable.  Maybe a little bit more productive cough with clear mucus now in pollen season, but this does not seem to be the source of her throat clearing complaint today. Plan-continue current meds.

## 2022-05-09 NOTE — Assessment & Plan Note (Signed)
Nonseasonal, nonspecific but bothersome and persistent. Plan-Limited CT scan of sinuses, saline nasal rinse, Sudafed, continue antihistamine

## 2022-05-10 ENCOUNTER — Ambulatory Visit: Payer: Medicare Other | Admitting: Internal Medicine

## 2022-05-16 ENCOUNTER — Ambulatory Visit (HOSPITAL_BASED_OUTPATIENT_CLINIC_OR_DEPARTMENT_OTHER)
Admission: RE | Admit: 2022-05-16 | Discharge: 2022-05-16 | Disposition: A | Discharge status: Home / Self Care | Payer: Medicare Other | Source: Ambulatory Visit | Attending: Internal Medicine | Admitting: Internal Medicine

## 2022-05-16 DIAGNOSIS — J320 Chronic maxillary sinusitis: Secondary | ICD-10-CM | POA: Diagnosis not present

## 2022-05-17 ENCOUNTER — Encounter: Payer: Self-pay | Admitting: Internal Medicine

## 2022-05-17 MED ORDER — DOXYCYCLINE HYCLATE 100 MG PO TABS: 100.0000 mg | ORAL_TABLET | Freq: Two times a day (BID) | ORAL | 1 refills | Status: DC

## 2022-05-17 MED ORDER — TRAMADOL HCL 50 MG PO TABS: ORAL_TABLET | ORAL | 0 refills | Status: DC

## 2022-05-17 MED ORDER — PREDNISONE 10 MG PO TABS: ORAL_TABLET | ORAL | 0 refills | Status: DC

## 2022-05-17 NOTE — Telephone Encounter (Signed)
Scripts sent to [facility] for tramadol, prednisone and doxycycline

## 2022-05-21 LAB — LAB REPORT - SCANNED: EGFR: 100

## 2022-05-25 NOTE — Telephone Encounter (Signed)
Please finish the last doxycycline If there is any question of remaining infection, I suggest we start augmentin 875 mg, # 14, 1 twice daily. That can be added on. Meanwhile, please start the authorization process to get report of her CT scan, and also the disc if possible, from her hospital in [place].

## 2022-05-26 MED ORDER — AMOXICILLIN-POT CLAVULANATE 875-125 MG PO TABS: 1.0000 | ORAL_TABLET | Freq: Two times a day (BID) | ORAL | 0 refills | Status: DC

## 2022-05-28 NOTE — Progress Notes (Unsigned)
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 Pt states this has been occurring for 6 months. Has been trying to see ENT no appointments available  Bothersome persistent sense of postnasal drip, throat clearing and retronasal stuffiness over the last 6 months.  Not relieved by antihistamines.  Cough is productive occasionally of scant clear mucus.  Acid blocker controls reflux symptoms.  Occasional frontal or retro-orbital headaches.  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  Pred sent 5/7 with doxy. ------[place]. She states Doctor there said she had bubbles in her lung She and family started out by car driving to [place] to visit a sick relative about 5 or 6 days ago.  Getting out of car for rest stops she noted marked dyspnea and needed to use her portable oxygen.  Feeling worse, they stopped [place] where she was hospitalized for 2 days.  She was told CT scan showed "bubbles in my lungs".  Her previous CT here in 2023 did not describe dramatic blebs.  She does not know if she was checked for PE.  She noted marked improvement after IV Solu-Medrol. On return here we had sent in Augmentin which she continues.  She also is finishing a prednisone taper.  She comes today on portable oxygen at 2-3 L, although usually she comes here on room air.  She feels as if discomfort consistent with her right maxillary sinusitis is improving but she has a persistent "clogged" feeling in her throat.  Cough is mostly nonproductive and I do not see significant thrush. She describes some aching in the right flank around to right lower anterior axillary line approximately.  She is not having leg discomfort. Is going to have her husband bring over the disc with her CT and report from [place]. She apparently has an extensive family history of cancer but is not aware if she has had any sort of genetic testing.  I suggested she ask her oncologist about this. CT maxfac 05/16/22- IMPRESSION: 1. Isolated Right maxillary sinus disease with moderately opacified sinus, and some bubbly opacity suggesting a component of acute sinusitis. There could be an underlying mucous retention cyst. 2. Remaining paranasal sinuses are well aerated. Chronic right orbit prosthesis.  Addendum 06/01/22- DC summary obtained from her stay in [place], [place]- COPD exacerbation. Rx'd with solumedrol, duoneb. CT report is filled and disc reviewed by me- severe bullous  emphysema.  EKG RBBB.    ROS-see HPI   + = positive Constitutional:   No-   weight loss, night sweats, fevers, chills, +fatigue, lassitude. HEENT:   No-  headaches, difficulty swallowing, tooth/dental problems, sore throat,       No-  sneezing, itching, ear ache, +nasal congestion, +post nasal drip,  CV:  No-   chest pain, orthopnea, PND, swelling in lower extremities, anasarca, dizziness, palpitations Resp: + shortness of breath with exertion or at rest.                +productive cough,  + non-productive cough,  No- coughing up of blood.              +change in color of mucus.  + wheezing.   Skin: No-   rash or lesions. GI:  No-   heartburn, indigestion, abdominal pain, nausea, vomiting,  GU:  MS:  No-   joint pain or swelling.   Neuro-     nothing unusual Psych:  No- change in mood or affect. No depression or anxiety.  No memory loss.  OBJ- Physical Exam     General- Alert, Oriented, Affect-appropriate,      POC 2L Skin- clear - +no shingles rash r flank Lymphadenopathy- none Head- atraumatic            Eyes- + R prosthesis            Ears- Hearing, canals-normal            Nose- clear, no-Septal dev, mucus, polyps, erosion, perforation             Throat- Mallampati IV , mucosa clear/  drainage- none, tonsils- small residual, + occasional throat clearing Neck- flexible , trachea midline, no stridor , thyroid nl, carotid no bruit Chest - symmetrical excursion , unlabored           Heart/CV- RRR , no murmur , no gallop  , no rub, nl s1 s2                           - JVD- none , edema- none, stasis changes- none, varices- none           Lung-  +distant/ clear, Wheeze- none, cough-none , dullness-none, rub- none,             Chest wall-  Abd-  Br/ Gen/ Rectal- Not done, not indicated Extrem- cyanosis- none, clubbing, none, atrophy- none, strength- nl Neuro- grossly intact to observation

## 2022-05-30 ENCOUNTER — Ambulatory Visit (INDEPENDENT_AMBULATORY_CARE_PROVIDER_SITE_OTHER): Payer: Medicare Other | Admitting: Internal Medicine

## 2022-05-30 ENCOUNTER — Encounter: Payer: Self-pay | Admitting: Internal Medicine

## 2022-05-30 ENCOUNTER — Ambulatory Visit (INDEPENDENT_AMBULATORY_CARE_PROVIDER_SITE_OTHER): Payer: Medicare Other

## 2022-05-30 VITALS — BP 120/68 | HR 101 | Ht 62.0 in | Wt 137.8 lb

## 2022-05-30 DIAGNOSIS — R0609 Other forms of dyspnea: Secondary | ICD-10-CM

## 2022-05-30 DIAGNOSIS — J9611 Chronic respiratory failure with hypoxia: Secondary | ICD-10-CM

## 2022-05-30 DIAGNOSIS — J441 Chronic obstructive pulmonary disease with (acute) exacerbation: Secondary | ICD-10-CM

## 2022-05-30 LAB — COMPREHENSIVE METABOLIC PANEL
ALT: 55 U/L — ABNORMAL HIGH (ref 0–35)
AST: 24 U/L (ref 0–37)
Albumin: 3.7 g/dL (ref 3.5–5.2)
Alkaline Phosphatase: 73 U/L (ref 39–117)
BUN: 12 mg/dL (ref 6–23)
CO2: 37 mEq/L — ABNORMAL HIGH (ref 19–32)
Calcium: 9.7 mg/dL (ref 8.4–10.5)
Chloride: 96 mEq/L (ref 96–112)
Creatinine, Ser: 0.57 mg/dL (ref 0.40–1.20)
GFR: 95.37 mL/min (ref >60.00)
Glucose, Bld: 115 mg/dL — ABNORMAL HIGH (ref 70–99)
Potassium: 3.7 mEq/L (ref 3.5–5.1)
Sodium: 142 mEq/L (ref 135–145)
Total Bilirubin: 0.2 mg/dL (ref 0.2–1.2)
Total Protein: 6.1 g/dL (ref 6.0–8.3)

## 2022-05-30 LAB — D-DIMER, QUANTITATIVE: D-Dimer, Quant: <0.19 mcg/mL FEU (ref <0.50)

## 2022-05-30 MED ORDER — FLUCONAZOLE 150 MG PO TABS: 150.0000 mg | ORAL_TABLET | Freq: Every day | ORAL | 0 refills | Status: DC

## 2022-05-30 NOTE — Addendum Note (Signed)
Addended by: Erick Blinks A on: 05/30/2022 03:37 PM   Modules accepted: Orders

## 2022-05-30 NOTE — Assessment & Plan Note (Signed)
Not clear to what extent recent symptoms reflect her sinus infection and I cannot tell if she is feeling some thrush below the level that I can see in her pharynx.  I will send Diflucan and meanwhile we will check chest x-ray.  WBC would be uninterpretable because of recent steroids.  Hopefully D-dimer will help [place] rule out DVT/PE as a basis for her initial complaint of increased dyspnea.

## 2022-05-30 NOTE — Patient Instructions (Signed)
Order- CXR   dx COPD exacerbation  Order- lab- D-dimer, BMET      dx dyspnea on exertion  Script went for Diflucan   1 daily for yeast

## 2022-05-30 NOTE — Assessment & Plan Note (Signed)
She will continue oxygen at 2-3 L with activity and 2 L for sleep.

## 2022-05-31 ENCOUNTER — Telehealth: Payer: Self-pay | Admitting: Internal Medicine

## 2022-05-31 ENCOUNTER — Ambulatory Visit (INDEPENDENT_AMBULATORY_CARE_PROVIDER_SITE_OTHER): Payer: Medicare Other | Admitting: Family Medicine

## 2022-05-31 ENCOUNTER — Encounter: Payer: Self-pay | Admitting: Family Medicine

## 2022-05-31 VITALS — BP 108/76 | HR 76 | Temp 97.9°F | Resp 18 | Ht 62.0 in | Wt 138.4 lb

## 2022-05-31 DIAGNOSIS — I10 Essential (primary) hypertension: Secondary | ICD-10-CM

## 2022-05-31 DIAGNOSIS — E785 Hyperlipidemia, unspecified: Secondary | ICD-10-CM | POA: Diagnosis not present

## 2022-05-31 DIAGNOSIS — B029 Zoster without complications: Secondary | ICD-10-CM | POA: Diagnosis not present

## 2022-05-31 LAB — CBC WITH DIFFERENTIAL/PLATELET
Basophils Absolute: 0 10*3/uL (ref 0.0–0.1)
Basophils Relative: 0.3 % (ref 0.0–3.0)
Eosinophils Absolute: 0 10*3/uL (ref 0.0–0.7)
Eosinophils Relative: 0.2 % (ref 0.0–5.0)
HCT: 42.3 % (ref 36.0–46.0)
Hemoglobin: 13.8 g/dL (ref 12.0–15.0)
Lymphocytes Relative: 14.2 % (ref 12.0–46.0)
Lymphs Abs: 2.3 10*3/uL (ref 0.7–4.0)
MCHC: 32.5 g/dL (ref 30.0–36.0)
MCV: 98.2 fl (ref 78.0–100.0)
Monocytes Absolute: 0.9 10*3/uL (ref 0.1–1.0)
Monocytes Relative: 5.7 % (ref 3.0–12.0)
Neutro Abs: 13.2 10*3/uL — ABNORMAL HIGH (ref 1.4–7.7)
Neutrophils Relative %: 79.6 % — ABNORMAL HIGH (ref 43.0–77.0)
Platelets: 309 10*3/uL (ref 150.0–400.0)
RBC: 4.3 Mil/uL (ref 3.87–5.11)
RDW: 12.7 % (ref 11.5–15.5)
WBC: 16.6 10*3/uL — ABNORMAL HIGH (ref 4.0–10.5)

## 2022-05-31 LAB — LIPID PANEL
Cholesterol: 168 mg/dL (ref 0–200)
HDL: 82.8 mg/dL (ref >39.00)
LDL Cholesterol: 71 mg/dL (ref 0–99)
NonHDL: 85.12
Total CHOL/HDL Ratio: 2
Triglycerides: 73 mg/dL (ref 0.0–149.0)
VLDL: 14.6 mg/dL (ref 0.0–40.0)

## 2022-05-31 NOTE — Patient Instructions (Signed)
Schedule your complete physical in 6 months We'll notify you of your lab results and make any changes if needed You have a shingles outbreak- let me know if you have another (hopefully not!!) Call with any questions or concerns Stay Safe!  Stay Healthy! Hang in there!!!

## 2022-05-31 NOTE — Assessment & Plan Note (Signed)
Chronic problem, excellent control on Valsartan HCTZ 160/25mg  daily.  Currently asymptomatic.  Reviewed labs done yesterday.  No need to repeat.  No med changes at this time

## 2022-05-31 NOTE — Assessment & Plan Note (Signed)
Chronic problem.  On Lipitor 40mg daily w/o difficulty.  Check labs.  Adjust meds prn  ?

## 2022-05-31 NOTE — Telephone Encounter (Signed)
PT presented to front with large manilla envelope marked "Confidential" to Dr. Maple Hudson. He dcln copies. I put in Dr. Lenoria Farrier box.- T

## 2022-05-31 NOTE — Progress Notes (Signed)
   Subjective:    Patient ID: Carol Quinn, female    DOB: March 17, 1955, 67 y.o.   MRN: [id number]  HPI HTN- chronic problem, on Valsartan HCTZ 160/25mg  daily.  No CP, HA's, visual changes, edema.  Hyperlipidemia- on Lipitor 40mg  daily.  Denies abd pain, N/V.  Herpes- pt's husband has hx of herpes from decades ago.  Pt has never had any issues.  Now has a 'breakout' on her lower back/upper behind.     Review of Systems For ROS see HPI     Objective:   Physical Exam Vitals reviewed.  Constitutional:      General: She is not in acute distress.    Appearance: Normal appearance. She is well-developed. She is not ill-appearing.  HENT:     Head: Normocephalic and atraumatic.  Eyes:     Conjunctiva/sclera: Conjunctivae normal.     Pupils: Pupils are equal, round, and reactive to light.  Neck:     Thyroid: No thyromegaly.  Cardiovascular:     Rate and Rhythm: Normal rate and regular rhythm.     Heart sounds: Normal heart sounds. No murmur heard. Pulmonary:     Effort: Pulmonary effort is normal. No respiratory distress.     Breath sounds: Normal breath sounds.  Abdominal:     General: There is no distension.     Palpations: Abdomen is soft.     Tenderness: There is no abdominal tenderness.  Musculoskeletal:     Cervical back: Normal range of motion and neck supple.  Lymphadenopathy:     Cervical: No cervical adenopathy.  Skin:    General: Skin is warm and dry.     Findings: Rash (linear vesicular lesions on R upper buttock) present.  Neurological:     Mental Status: She is alert and oriented to person, place, and time. Mental status is at baseline.  Psychiatric:        Mood and Affect: Mood normal.        Behavior: Behavior normal.        Thought Content: Thought content normal.           Assessment & Plan:

## 2022-05-31 NOTE — Assessment & Plan Note (Signed)
New.  Pt has thankfully had shingles vaccines so area is not as painful as it would otherwise be.  Likely triggered by the stress of recent hospitalization.  No utility in starting Valtrex as sxs appeared 1 week ago.

## 2022-06-01 ENCOUNTER — Telehealth: Payer: Self-pay

## 2022-06-01 ENCOUNTER — Telehealth: Payer: Self-pay | Admitting: Internal Medicine

## 2022-06-01 NOTE — Telephone Encounter (Signed)
Left results via my chart

## 2022-06-01 NOTE — Telephone Encounter (Signed)
Envelope has been put on Dr. Sinclair Ship desk. NFN

## 2022-06-01 NOTE — Telephone Encounter (Signed)
I called her to say I had reviewed CT disc and DC summary from hosp in [place], [place]. Managed as we would have done for exacerb COPD without evidence of pneumonia, PE or CHF. We discussed use of the Flutter device she was given.

## 2022-06-01 NOTE — Telephone Encounter (Signed)
-----   Message from Sheliah Hatch, MD sent at 05/31/2022  8:43 PM EDT ----- Labs look great w/ exception of elevated WBC which is consistent w/ steroid use and infection.  No cause for concern.

## 2022-06-02 ENCOUNTER — Other Ambulatory Visit: Payer: Self-pay | Admitting: Family Medicine

## 2022-06-02 DIAGNOSIS — J302 Other seasonal allergic rhinitis: Secondary | ICD-10-CM

## 2022-06-03 ENCOUNTER — Other Ambulatory Visit: Payer: Self-pay

## 2022-06-03 ENCOUNTER — Telehealth: Payer: Self-pay | Admitting: Internal Medicine

## 2022-06-03 MED ORDER — ALBUTEROL SULFATE (2.5 MG/3ML) 0.083% IN NEBU: 2.5000 mg | INHALATION_SOLUTION | Freq: Four times a day (QID) | RESPIRATORY_TRACT | 11 refills | Status: DC | PRN

## 2022-06-03 NOTE — Telephone Encounter (Signed)
PT said Carol Quinn called her although I see no open encounter. Sent a Teams to [place]. No reply. Please call back. TY.

## 2022-06-03 NOTE — Telephone Encounter (Signed)
Went over lab work with patient. She verbalized understanding. NFN

## 2022-06-13 ENCOUNTER — Encounter: Payer: Self-pay | Admitting: Cardiology

## 2022-06-14 NOTE — Telephone Encounter (Signed)
58 days old. Please reply. TY.

## 2022-06-14 NOTE — Telephone Encounter (Signed)
Closing encounter. NFN 

## 2022-06-17 ENCOUNTER — Other Ambulatory Visit: Payer: Self-pay

## 2022-06-17 MED ORDER — AMOXICILLIN-POT CLAVULANATE 875-125 MG PO TABS: 1.0000 | ORAL_TABLET | Freq: Two times a day (BID) | ORAL | 0 refills | Status: DC

## 2022-06-17 MED ORDER — FLUCONAZOLE 150 MG PO TABS: 150.0000 mg | ORAL_TABLET | Freq: Every day | ORAL | 0 refills | Status: DC

## 2022-07-03 ENCOUNTER — Other Ambulatory Visit: Payer: Self-pay | Admitting: Internal Medicine

## 2022-07-22 ENCOUNTER — Other Ambulatory Visit: Payer: Self-pay | Admitting: *Deleted

## 2022-07-22 DIAGNOSIS — E785 Hyperlipidemia, unspecified: Secondary | ICD-10-CM

## 2022-07-22 MED ORDER — ATORVASTATIN CALCIUM 40 MG PO TABS
40.0000 mg | ORAL_TABLET | Freq: Every day | ORAL | 0 refills | Status: DC
Start: 2022-07-22 — End: 2022-08-19

## 2022-08-01 ENCOUNTER — Other Ambulatory Visit: Payer: Self-pay | Admitting: Family Medicine

## 2022-08-01 ENCOUNTER — Other Ambulatory Visit: Payer: Self-pay

## 2022-08-01 DIAGNOSIS — E785 Hyperlipidemia, unspecified: Secondary | ICD-10-CM

## 2022-08-01 DIAGNOSIS — F32A Depression, unspecified: Secondary | ICD-10-CM

## 2022-08-12 ENCOUNTER — Other Ambulatory Visit: Payer: Self-pay

## 2022-08-12 ENCOUNTER — Encounter: Payer: Self-pay | Admitting: Hematology & Oncology

## 2022-08-12 ENCOUNTER — Inpatient Hospital Stay (HOSPITAL_BASED_OUTPATIENT_CLINIC_OR_DEPARTMENT_OTHER): Payer: Medicare Other | Admitting: Hematology & Oncology

## 2022-08-12 ENCOUNTER — Inpatient Hospital Stay: Payer: Medicare Other | Attending: Hematology & Oncology

## 2022-08-12 VITALS — BP 105/48 | HR 75 | Temp 98.2°F | Resp 18 | Ht 62.0 in | Wt 136.8 lb

## 2022-08-12 DIAGNOSIS — H349 Unspecified retinal vascular occlusion: Secondary | ICD-10-CM | POA: Insufficient documentation

## 2022-08-12 DIAGNOSIS — C6931 Malignant neoplasm of right choroid: Secondary | ICD-10-CM

## 2022-08-12 DIAGNOSIS — J441 Chronic obstructive pulmonary disease with (acute) exacerbation: Secondary | ICD-10-CM | POA: Diagnosis not present

## 2022-08-12 DIAGNOSIS — Z85820 Personal history of malignant melanoma of skin: Secondary | ICD-10-CM | POA: Diagnosis present

## 2022-08-12 LAB — CBC WITH DIFFERENTIAL (CANCER CENTER ONLY)
Abs Immature Granulocytes: 0.02 10*3/uL (ref 0.00–0.07)
Basophils Absolute: 0.1 10*3/uL (ref 0.0–0.1)
Basophils Relative: 1 %
Eosinophils Absolute: 0.3 10*3/uL (ref 0.0–0.5)
Eosinophils Relative: 4 %
HCT: 40.4 % (ref 36.0–46.0)
Hemoglobin: 13.5 g/dL (ref 12.0–15.0)
Immature Granulocytes: 0 %
Lymphocytes Relative: 28 %
Lymphs Abs: 2.3 10*3/uL (ref 0.7–4.0)
MCH: 32.8 pg (ref 26.0–34.0)
MCHC: 33.4 g/dL (ref 30.0–36.0)
MCV: 98.1 fL (ref 80.0–100.0)
Monocytes Absolute: 0.9 10*3/uL (ref 0.1–1.0)
Monocytes Relative: 11 %
Neutro Abs: 4.7 10*3/uL (ref 1.7–7.7)
Neutrophils Relative %: 56 %
Platelet Count: 267 10*3/uL (ref 150–400)
RBC: 4.12 MIL/uL (ref 3.87–5.11)
RDW: 11.9 % (ref 11.5–15.5)
WBC Count: 8.4 10*3/uL (ref 4.0–10.5)
nRBC: 0 % (ref 0.0–0.2)

## 2022-08-12 LAB — CMP (CANCER CENTER ONLY)
ALT: 21 U/L (ref 0–44)
AST: 17 U/L (ref 15–41)
Albumin: 4 g/dL (ref 3.5–5.0)
Alkaline Phosphatase: 73 U/L (ref 38–126)
Anion gap: 8 (ref 5–15)
BUN: 20 mg/dL (ref 8–23)
CO2: 34 mmol/L — ABNORMAL HIGH (ref 22–32)
Calcium: 9.3 mg/dL (ref 8.9–10.3)
Chloride: 99 mmol/L (ref 98–111)
Creatinine: 0.77 mg/dL (ref 0.44–1.00)
GFR, Estimated: >60 mL/min (ref >60)
Glucose, Bld: 112 mg/dL — ABNORMAL HIGH (ref 70–99)
Potassium: 3.5 mmol/L (ref 3.5–5.1)
Sodium: 141 mmol/L (ref 135–145)
Total Bilirubin: 0.6 mg/dL (ref 0.3–1.2)
Total Protein: 6.4 g/dL — ABNORMAL LOW (ref 6.5–8.1)

## 2022-08-12 LAB — LACTATE DEHYDROGENASE: LDH: 113 U/L (ref 98–192)

## 2022-08-12 NOTE — Progress Notes (Signed)
Hematology and Oncology Follow Up Visit  Carol Quinn [id number] [birth year] 67 y.o. 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  She is doing okay although she has had no issues since we last saw her.  Back in May, she ended up in a hospital down in [place].  She had a COPD exacerbation.  She was in the hospital for several days.  She said that she really got good care down there.  She was very impressed with the care of the staff.  Yesterday, she was helping her husband change liable.  He lost his balance.  She got knocked over.  Thankfully, she did not break anything.  Her left hand is in a make shift splint.  She does not think that she needs to see her doctor for this.  Otherwise, she is doing fairly well.  She has had no problems with nausea or vomiting.  There is been no change in bowel or bladder habits.  She has had her mammogram this year.  Everything looks fine on the mammogram.  She had an upper endoscopy and colonoscopy on 03/22/2022.  She had several polyps removed from the colon.  The pathology report only showed tubular adenomas.  Currently, I would say that her performance status is probably ECOG 1.    Medications:  Allergies as of 08/12/2022       Reactions   Clarithromycin Diarrhea, Rash, Other (See Comments)   Hydrocodone Bit-homatrop Mbr Other (See Comments)   paranoia   Hydrocodone Bit-homatrop Mbr Other (See Comments)   paranoia   Codeine Itching   Penicillins Other (See Comments)   Has patient had a PCN reaction causing immediate rash, facial/tongue/throat swelling, SOB or lightheadedness with hypotension: Unknown Has patient had a PCN reaction causing severe rash involving mucus membranes or skin necrosis:  Unknown Has patient had a PCN reaction that required hospitalization: Unknown Has patient had a PCN reaction occurring within the last 10 years: No If all of the above answers are "NO", then may proceed with Cephalosporin use.   Topiramate Other (See Comments)   Other reaction(s): WEIGHT LOSS        Medication List        Accurate as of August 12, 2022  8:53 AM. If you have any questions, ask your nurse or doctor.          STOP taking these medications    amoxicillin-clavulanate 875-125 MG tablet Commonly known as: AUGMENTIN Stopped by: Josph Macho   predniSONE 10 MG tablet Commonly known as: DELTASONE Stopped by: Josph Macho       TAKE these medications    albuterol 108 (90 Base) MCG/ACT inhaler Commonly known as: VENTOLIN HFA Inhale 2 puffs into the lungs every 6 (six) hours as needed for wheezing or shortness of breath.   albuterol (2.5 MG/3ML) 0.083% nebulizer solution Commonly known as: PROVENTIL Take 3 mLs (2.5 mg total) by nebulization every 6 (six) hours as needed for wheezing or shortness of breath.   aspirin EC 81 MG tablet Take 81 mg by mouth daily. Swallow whole.   aspirin-acetaminophen-caffeine 250-250-65 MG tablet Commonly known as: EXCEDRIN MIGRAINE Take 2 tablets by mouth every 6 (six) hours as needed for headache.   atorvastatin 40 MG tablet Commonly known as: LIPITOR  Take 1 tablet (40 mg total) by mouth daily.   buPROPion 150 MG 24 hr tablet Commonly known as: WELLBUTRIN XL TAKE ONE TABLET BY MOUTH IN THE MORNING   escitalopram 20 MG tablet Commonly known as: LEXAPRO Take 1 tablet (20 mg total) by mouth daily.   estradiol 0.0375 MG/24HR Commonly known as: VIVELLE-DOT Place 1 patch onto the skin 2 (two) times a week.   fexofenadine 180 MG tablet Commonly known as: ALLEGRA Take 180 mg by mouth daily.   fluconazole 150 MG tablet Commonly known as: Diflucan Take 1 tablet (150 mg total) by mouth daily. What changed: Another  medication with the same name was removed. Continue taking this medication, and follow the directions you see here. Changed by: Josph Macho   Flutter Devi Use as directed   modafinil 200 MG tablet Commonly known as: PROVIGIL Take 1 tablet (200 mg total) by mouth daily.   montelukast 10 MG tablet Commonly known as: SINGULAIR TAKE ONE TABLET BY MOUTH DAILY AT BEDTIME   multivitamin with minerals tablet Take 1 tablet by mouth daily.   omeprazole 40 MG capsule Commonly known as: PRILOSEC TAKE ONE CAPSULE BY MOUTH DAILY   traMADol 50 MG tablet Commonly known as: ULTRAM 1 or 2 every 6 hours if needed for cough   Trelegy Ellipta 100-62.5-25 MCG/ACT Aepb Generic drug: Fluticasone-Umeclidin-Vilant Inhale 1 puff into the lungs daily.   valsartan-hydrochlorothiazide 160-25 MG tablet Commonly known as: DIOVAN-HCT Take 1 tablet by mouth daily.   varenicline 1 MG tablet Commonly known as: CHANTIX TAKE 1 TABLET BY MOUTH TWICE A DAY        Allergies:  Allergies  Allergen Reactions   Clarithromycin Diarrhea, Rash and Other (See Comments)   Hydrocodone Bit-Homatrop Mbr Other (See Comments)    paranoia   Hydrocodone Bit-Homatrop Mbr Other (See Comments)    paranoia   Codeine Itching   Penicillins Other (See Comments)     Has patient had a PCN reaction causing immediate rash, facial/tongue/throat swelling, SOB or lightheadedness with hypotension: Unknown Has patient had a PCN reaction causing severe rash involving mucus membranes or skin necrosis: Unknown Has patient had a PCN reaction that required hospitalization: Unknown Has patient had a PCN reaction occurring within the last 10 years: No If all of the above answers are "NO", then may proceed with Cephalosporin use.    Topiramate Other (See Comments)    Other reaction(s): WEIGHT LOSS     Past Medical History, Surgical history, Social history, and Family History were reviewed and updated.  Review of  Systems: Review of Systems  Constitutional: Negative.   HENT: Negative.    Eyes: Negative.   Respiratory: Negative.    Cardiovascular: Negative.   Gastrointestinal:  Positive for abdominal pain.  Genitourinary: Negative.   Musculoskeletal: Negative.   Skin: Negative.   Neurological: Negative.   Endo/Heme/Allergies: Negative.   Psychiatric/Behavioral: Negative.        Physical Exam:  height is 5\' 2"  (1.575 m) and weight is 136 lb 12.8 oz (62.1 kg). Her oral temperature is 98.2 F (36.8 C). Her blood pressure is 105/48 (abnormal) and her pulse is 75. Her respiration is 18 and oxygen saturation is 98%.   Wt Readings from Last 3 Encounters:  08/12/22 136 lb 12.8 oz (62.1 kg)  05/31/22 138 lb 6 oz (62.8 kg)  05/30/22 137 lb 12.8 oz (62.5 kg)    Physical Exam Vitals reviewed.  HENT:     Head: Normocephalic and atraumatic.  Eyes:  Pupils: Pupils are equal, round, and reactive to light.     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  Left eye is unremarkable.  She has good extraocular muscle movement of the left eye.  Cardiovascular:     Rate and Rhythm: Normal rate and regular rhythm.     Heart sounds: Normal heart sounds.  Pulmonary:     Effort: Pulmonary effort is normal.     Breath sounds: Normal breath sounds.  Abdominal:     General: Bowel sounds are normal.     Palpations: Abdomen is soft.  Musculoskeletal:        General: No tenderness or deformity. Normal range of motion.     Cervical back: Normal range of motion.  Lymphadenopathy:     Cervical: No cervical adenopathy.  Skin:    General: Skin is warm and dry.     Findings: No erythema or rash.  Neurological:     Mental Status: She is alert and oriented to person, place, and time.  Psychiatric:        Behavior: Behavior normal.        Thought Content: Thought content normal.        Judgment: Judgment normal.     Lab Results  Component Value Date   WBC 8.4 08/12/2022   HGB 13.5  08/12/2022   HCT 40.4 08/12/2022   MCV 98.1 08/12/2022   PLT 267 08/12/2022   No results found for: "FERRITIN", "IRON", "TIBC", "UIBC", "IRONPCTSAT" Lab Results  Component Value Date   RBC 4.12 08/12/2022   No results found for: "KPAFRELGTCHN", "LAMBDASER", "KAPLAMBRATIO" No results found for: "IGGSERUM", "IGA", "IGMSERUM" No results found for: "TOTALPROTELP", "ALBUMINELP", "A1GS", "A2GS", "BETS", "BETA2SER", "GAMS", "MSPIKE", "SPEI"   Chemistry      Component Value Date/Time   NA 141 08/12/2022 0754   NA 143 12/23/2016 0848   K 3.5 08/12/2022 0754   K 4.3 12/23/2016 0848   CL 99 08/12/2022 0754   CL 99 12/23/2016 0848   CO2 34 (H) 08/12/2022 0754   CO2 31 12/23/2016 0848   BUN 20 08/12/2022 0754   BUN 10 12/23/2016 0848   CREATININE 0.77 08/12/2022 0754   CREATININE 1.0 12/23/2016 0848      Component Value Date/Time   CALCIUM 9.3 08/12/2022 0754   CALCIUM 9.4 12/23/2016 0848   ALKPHOS 73 08/12/2022 0754   ALKPHOS 99 (H) 12/23/2016 0848   AST 17 08/12/2022 0754   ALT 21 08/12/2022 0754   ALT 26 12/23/2016 0848   BILITOT 0.6 08/12/2022 0754      Impression and Plan: Carol Quinn is a very pleasant 67 yo caucasian female with a choroidal melanoma of the right eye with right eye enucleation in October 2017.   From my point of view, everything looks quite nice.  I do not see evidence of recurrent disease.  Her labs look fine.  I do not see that she has to do any scans.  I would like to follow her up in December.  I just want make sure we stay in touch with her.  I know that she does have the underlying COPD which can clearly be a real problem for her.   Josph Macho, MD 8/2/20248:53 AM

## 2022-08-19 ENCOUNTER — Other Ambulatory Visit: Payer: Self-pay | Admitting: Cardiology

## 2022-08-19 DIAGNOSIS — E785 Hyperlipidemia, unspecified: Secondary | ICD-10-CM

## 2022-08-25 ENCOUNTER — Encounter: Payer: Self-pay | Admitting: Internal Medicine

## 2022-08-30 ENCOUNTER — Ambulatory Visit: Payer: Medicare Other | Admitting: Internal Medicine

## 2022-08-30 ENCOUNTER — Other Ambulatory Visit: Payer: Self-pay | Admitting: Internal Medicine

## 2022-09-07 ENCOUNTER — Encounter: Payer: Self-pay | Admitting: Family Medicine

## 2022-09-08 ENCOUNTER — Encounter: Payer: Self-pay | Admitting: Family Medicine

## 2022-09-08 ENCOUNTER — Ambulatory Visit (INDEPENDENT_AMBULATORY_CARE_PROVIDER_SITE_OTHER): Payer: Medicare Other | Admitting: Family Medicine

## 2022-09-08 VITALS — BP 104/84 | HR 83 | Temp 98.0°F | Resp 17 | Ht 62.0 in | Wt 135.5 lb

## 2022-09-08 DIAGNOSIS — R300 Dysuria: Secondary | ICD-10-CM

## 2022-09-08 DIAGNOSIS — Z7185 Encounter for immunization safety counseling: Secondary | ICD-10-CM

## 2022-09-08 DIAGNOSIS — R350 Frequency of micturition: Secondary | ICD-10-CM | POA: Diagnosis not present

## 2022-09-08 LAB — POCT URINALYSIS DIPSTICK
Blood, UA: POSITIVE
Glucose, UA: NEGATIVE
Protein, UA: NEGATIVE
Spec Grav, UA: 1.015 (ref 1.010–1.025)
Urobilinogen, UA: 0.2 E.U./dL
pH, UA: 7 (ref 5.0–8.0)

## 2022-09-08 MED ORDER — CEPHALEXIN 500 MG PO CAPS: 500.0000 mg | ORAL_CAPSULE | Freq: Two times a day (BID) | ORAL | 0 refills | Status: AC

## 2022-09-08 NOTE — Patient Instructions (Addendum)
Follow up as needed or as scheduled START the Cephalexin twice daily- take w/ food Drink LOTS of fluids Get the RSV vaccine and the flu vaccine at the pharmacy Call with any questions or concerns Hang in there!  Give yourself time to recover!

## 2022-09-08 NOTE — Progress Notes (Signed)
   Subjective:    Patient ID: Carol Quinn, female    DOB: Aug 24, 1955, 67 y.o.   MRN: [id number]  HPI Urinary frequency- sxs started ~10 days ago.  Sxs are worse at night and seem to occur every other night.  No burning w/ urination.  + urgency- some incontinence.  + low back discomfort.    Vaccine questions- pt doesn't understand the RSV vaccine and isn't sure if she should get it.   Review of Systems For ROS see HPI     Objective:   Physical Exam Vitals reviewed.  Constitutional:      General: She is not in acute distress.    Appearance: Normal appearance. She is well-developed. She is not ill-appearing.  Abdominal:     General: There is no distension.     Palpations: Abdomen is soft.     Tenderness: There is no abdominal tenderness (no suprapubic or CVA tenderness).  Skin:    General: Skin is warm and dry.  Neurological:     General: No focal deficit present.     Mental Status: She is alert and oriented to person, place, and time.  Psychiatric:        Mood and Affect: Mood normal.        Behavior: Behavior normal.        Thought Content: Thought content normal.           Assessment & Plan:  Urinary Frequency- new.  Pt's sxs and UA consistent w/ infxn.  Start Keflex as she has taken other cephalosporins in the past w/o difficulty.  Pt expressed understanding and is in agreement w/ plan.   Vaccine counseling- advised pt that given her age and underlying lung condition, she should get the RSV vaccine at her local pharmacy.  I encouraged her to wait until she has recovered from her recent surgery so we don't overwhelm her body.  She agrees.

## 2022-09-10 LAB — URINE CULTURE
MICRO NUMBER:: 15400080
SPECIMEN QUALITY:: ADEQUATE

## 2022-09-15 ENCOUNTER — Encounter: Payer: Self-pay | Admitting: Family Medicine

## 2022-09-15 ENCOUNTER — Ambulatory Visit (INDEPENDENT_AMBULATORY_CARE_PROVIDER_SITE_OTHER): Payer: Medicare Other | Admitting: Family Medicine

## 2022-09-15 VITALS — BP 122/82 | HR 80 | Temp 98.8°F | Resp 16 | Ht 62.0 in | Wt 136.2 lb

## 2022-09-15 DIAGNOSIS — M79604 Pain in right leg: Secondary | ICD-10-CM | POA: Diagnosis not present

## 2022-09-15 DIAGNOSIS — M79605 Pain in left leg: Secondary | ICD-10-CM

## 2022-09-15 NOTE — Patient Instructions (Signed)
Message me after 2-3 weeks and let me know how the legs are feeling HOLD the Atorvastatin for at least 2 weeks to see if leg pain improves Make sure you are drinking LOTS of water Call with any questions or concerns Hang in there!!!

## 2022-09-15 NOTE — Progress Notes (Signed)
   Subjective:    Patient ID: Carol Quinn, female    DOB: 12/27/1955, 67 y.o.   MRN: [id number]  HPI Leg pain- pt reports this has been ongoing for last few years.  Has avoided flip flops, bought a leg massager.  Pain is posterior lower legs bilaterally.  'it feels like something is shredding my muscles'.  Pain worsens at night.  No burning or throbbing.  Has tried to exercise legs w/o relief.  Pt feels that the only thing that has changed is cholesterol medication (currently on Lipitor 40mg ).   Review of Systems For ROS see HPI     Objective:   Physical Exam Vitals reviewed.  Constitutional:      General: She is not in acute distress.    Appearance: Normal appearance. She is not ill-appearing.  HENT:     Head: Normocephalic and atraumatic.  Musculoskeletal:        General: Tenderness (TTP over posterior lower legs bilaterally) present. No swelling or deformity.  Skin:    General: Skin is warm and dry.     Findings: No rash.  Neurological:     Mental Status: She is alert and oriented to person, place, and time. Mental status is at baseline.  Psychiatric:        Mood and Affect: Mood normal.        Behavior: Behavior normal.        Thought Content: Thought content normal.           Assessment & Plan:  Lower leg pain- deteriorated.  Pt reports she has had this for 'years' but lately sxs are worsening, particularly at night.  Pain is not consistent w/ DVT.  Could be statin induced myalgia- will do a 2 week washout and reassess.  If no improvement, will refer to sports med or further evaluation.  Pt expressed understanding and is in agreement w/ plan.

## 2022-10-03 ENCOUNTER — Other Ambulatory Visit: Payer: Self-pay | Admitting: Family Medicine

## 2022-10-03 ENCOUNTER — Other Ambulatory Visit: Payer: Self-pay

## 2022-10-03 NOTE — Telephone Encounter (Signed)
Last office visit 09/05/2022 Lat refill 03/07/2022

## 2022-10-05 NOTE — Progress Notes (Signed)
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 Pred sent 5/7 with doxy. ------[place]. She states Doctor there said she had bubbles in her lung She and family started out by car driving to [place] to visit a sick relative about 5 or 6 days ago.  Getting out of car for rest stops she noted marked dyspnea and needed to use her portable oxygen.  Feeling worse, they stopped [place] where she was hospitalized for 2 days.  She was told CT scan showed "bubbles in my lungs".  Her previous CT here in 2023 did not describe dramatic blebs.  She does not know if she was checked for PE.  She noted marked improvement after IV Solu-Medrol. On return here we had sent in Augmentin which she continues.  She also is finishing a prednisone taper.  She comes today on portable oxygen at 2-3 L, although usually she comes here on room air.  She feels as if discomfort consistent  with her right maxillary sinusitis is improving but she has a persistent "clogged" feeling in her throat.  Cough is mostly nonproductive and I do not see significant thrush. She describes some aching in the right flank around to right lower anterior axillary line approximately.  She is not having leg discomfort. Is going to have her husband bring over the disc with her CT and report from [place]. She apparently has an extensive family history of cancer but is not aware if she has had any sort of genetic testing.  I suggested she ask her oncologist about this. CT maxfac 05/16/22- IMPRESSION: 1. Isolated Right maxillary sinus disease with moderately opacified sinus, and some bubbly opacity suggesting a component of acute sinusitis. There could be an underlying mucous retention cyst. 2. Remaining paranasal sinuses are well aerated. Chronic right orbit prosthesis.  Addendum 06/01/22- DC summary obtained from her stay in [place], [place]- COPD exacerbation. Rx'd with solumedrol, duoneb. CT report is filled and disc reviewed by me- severe bullous emphysema. EKG RBBB.   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  No acute concerns.  She now has a dog.  Discussed fall vaccination plans.  Needs refill of albuterol nebulizer solution.  Not much cough currently. CXR 06/02/22- IMPRESSION: Marked severity emphysema without acute cardiopulmonary disease.  ROS-see HPI   + = positive Constitutional:   No-   weight loss, night sweats, fevers, chills, +  fatigue, lassitude. HEENT:   No-   headaches, difficulty swallowing, tooth/dental problems, sore throat,       No-  sneezing, itching, ear ache, +nasal congestion, +post nasal drip,  CV:  No-   chest pain, orthopnea, PND, swelling in lower extremities, anasarca, dizziness, palpitations Resp: + shortness of breath with exertion or at rest.                productive cough,  + non-productive cough,  No- coughing up of blood.              +change in color of mucus.  + wheezing.   Skin: No-   rash or lesions. GI:  No-   heartburn, indigestion, abdominal pain, nausea, vomiting,  GU:  MS:  No-   joint pain or swelling.   Neuro-     nothing unusual Psych:  No- change in mood or affect. No depression or anxiety.  No memory loss.  OBJ- Physical Exam     General- Alert, Oriented, Affect-appropriate,      POC 2L Skin- clear  Lymphadenopathy- none Head- atraumatic            Eyes- + R prosthesis            Ears- Hearing, canals-normal            Nose- clear, no-Septal dev, mucus, polyps, erosion, perforation             Throat- Mallampati IV , mucosa clear/  drainage- none, tonsils- small residual, + occasional throat clearing Neck- flexible , trachea midline, no stridor , thyroid nl, carotid no bruit Chest - symmetrical excursion , unlabored           Heart/CV- RRR , no murmur , no gallop  , no rub, nl s1 s2                           - JVD- none , edema- none, stasis changes- none, varices- none           Lung-  +distant/ clear, Wheeze- none, cough-none , dullness-none, rub- none,             Chest wall-  Abd-  Br/ Gen/ Rectal- Not done, not indicated Extrem- cyanosis- none, clubbing, none, atrophy- none, strength- nl Neuro- grossly intact to observation

## 2022-10-06 ENCOUNTER — Encounter (INDEPENDENT_AMBULATORY_CARE_PROVIDER_SITE_OTHER): Payer: Medicare Other | Admitting: Family Medicine

## 2022-10-06 DIAGNOSIS — M791 Myalgia, unspecified site: Secondary | ICD-10-CM

## 2022-10-07 ENCOUNTER — Ambulatory Visit (INDEPENDENT_AMBULATORY_CARE_PROVIDER_SITE_OTHER): Payer: Medicare Other | Admitting: Internal Medicine

## 2022-10-07 ENCOUNTER — Encounter: Payer: Self-pay | Admitting: Internal Medicine

## 2022-10-07 VITALS — BP 112/74 | HR 77 | Ht 62.0 in | Wt 134.0 lb

## 2022-10-07 DIAGNOSIS — J9611 Chronic respiratory failure with hypoxia: Secondary | ICD-10-CM

## 2022-10-07 DIAGNOSIS — J449 Chronic obstructive pulmonary disease, unspecified: Secondary | ICD-10-CM | POA: Diagnosis not present

## 2022-10-07 DIAGNOSIS — Z23 Encounter for immunization: Secondary | ICD-10-CM | POA: Diagnosis not present

## 2022-10-07 MED ORDER — ALBUTEROL SULFATE (2.5 MG/3ML) 0.083% IN NEBU: 2.5000 mg | INHALATION_SOLUTION | Freq: Four times a day (QID) | RESPIRATORY_TRACT | 11 refills | Status: DC | PRN

## 2022-10-07 NOTE — Assessment & Plan Note (Signed)
No acute exacerbation.  I considered trying Ohtuvayre neb solution but we think her current meds are probably adequate. Plan-flu vaccine today, refill albuterol nebulizer solution

## 2022-10-07 NOTE — Patient Instructions (Signed)
Order- flu vax standard  Albuterol neb solution refilled

## 2022-10-07 NOTE — Assessment & Plan Note (Signed)
Continues oxygen dependent but can be off it sitting quietly on room air.

## 2022-10-11 DIAGNOSIS — M791 Myalgia, unspecified site: Secondary | ICD-10-CM | POA: Diagnosis not present

## 2022-10-11 DIAGNOSIS — T466X5A Adverse effect of antihyperlipidemic and antiarteriosclerotic drugs, initial encounter: Secondary | ICD-10-CM | POA: Diagnosis not present

## 2022-10-11 MED ORDER — ROSUVASTATIN CALCIUM 10 MG PO TABS: 10.0000 mg | ORAL_TABLET | Freq: Every day | ORAL | 3 refills | Status: DC

## 2022-10-11 NOTE — Addendum Note (Signed)
Addended by: Sheliah Hatch on: 10/11/2022 07:26 AM   Modules accepted: Orders

## 2022-10-11 NOTE — Telephone Encounter (Signed)
[facility] VISIT   Patient agreed to [facility] visit and is aware that copayment and coinsurance may apply. Patient was treated using telemedicine according to accepted telemedicine protocols.  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   Abnormal CT scan of lung 10/29/2020   Female stress incontinence 10/15/2020   Headache 05/20/2020   Dysfunction of both eustachian tubes 03/16/2020   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   Hollenhorst plaque, left eye 06/27/2019   Overweight (BMI 25.0-29.9) 05/22/2018   Trigger finger of right thumb 02/19/2018   Vitamin D deficiency 10/03/2017   Special screening for malignant neoplasms, colon    Benign neoplasm of sigmoid colon    Benign neoplasm of descending colon    Hyperlipidemia 06/28/2016   Aortic atherosclerosis (HCC) 10/26/2015   Benign neoplasm of left choroid 10/23/2015   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   Chronic respiratory failure with hypoxia (HCC) 03/05/2015   Balance disorder 05/17/2013   Routine general medical examination at a health care facility 12/21/2012   COPD exacerbation (HCC) 04/08/2011   HTN (hypertension) 04/08/2011   Tobacco abuse 04/08/2011   Eye dryness 08/20/2010   Depression 05/25/2010   Migraine with aura 05/25/2010   Hypersomnia 10/31/2008   STRESS INCONTINENCE 10/31/2008   DYSPNEA 06/27/2008   Seasonal and perennial allergic  rhinitis 05/03/2008   G E R D 05/03/2008   COPD mixed type (HCC) 04/18/2008   Social History   Tobacco Use   Smoking status: Former    Current packs/day: 0.00    Average packs/day: 0.5 packs/day for 40.0 years (20.0 ttl pk-yrs)    Types: Cigarettes    Start date: 08/1981    Quit date: 08/2021    Years since quitting: 1.1    Passive exposure: Current   Smokeless tobacco: Never  Substance Use Topics   Alcohol use: Yes    Alcohol/week: 0.0 standard drinks of alcohol    Comment: social    Current Outpatient Medications:    rosuvastatin (CRESTOR) 10 MG tablet, Take 1 tablet (10 mg total) by mouth daily., Disp: 30 tablet, Rfl: 3   albuterol (PROVENTIL) (2.5 MG/3ML) 0.083% nebulizer solution, Take 3 mLs (2.5 mg total) by nebulization every 6 (six) hours as needed for wheezing or shortness of breath., Disp: 150 mL, Rfl: 11   albuterol (VENTOLIN HFA) 108 (90 Base) MCG/ACT inhaler, Inhale 2 puffs into the lungs every 6 (six) hours as needed for wheezing or shortness of breath., Disp: 8 g, Rfl: 12   aspirin EC 81 MG tablet, Take 81 mg by mouth daily. Swallow whole., Disp: , Rfl:    aspirin-acetaminophen-caffeine (EXCEDRIN MIGRAINE) 250-250-65 MG tablet, Take 2 tablets by mouth every 6 (six) hours as needed for headache., Disp: , Rfl:    buPROPion (WELLBUTRIN XL) 150 MG 24 hr tablet, TAKE ONE TABLET BY MOUTH IN THE MORNING, Disp: 90 tablet, Rfl: 1   escitalopram (LEXAPRO) 20 MG tablet, TAKE ONE TABLET BY  MOUTH DAILY, Disp: 30 tablet, Rfl: 6   estradiol (VIVELLE-DOT) 0.0375 MG/24HR, Place 1 patch onto the skin 2 (two) times a week., Disp: , Rfl:    fexofenadine (ALLEGRA) 180 MG tablet, Take 180 mg by mouth daily., Disp: , Rfl:    Fluticasone-Umeclidin-Vilant (TRELEGY ELLIPTA) 100-62.5-25 MCG/ACT AEPB, Inhale 1 puff into the lungs daily., Disp: 60 each, Rfl: 12   levofloxacin (LEVAQUIN) 500 MG tablet, Take 500 mg by mouth daily., Disp: , Rfl:    modafinil (PROVIGIL) 200 MG tablet, Take 1 tablet  (200 mg total) by mouth daily., Disp: 30 tablet, Rfl: 5   montelukast (SINGULAIR) 10 MG tablet, TAKE ONE TABLET BY MOUTH DAILY AT BEDTIME, Disp: 30 tablet, Rfl: 3   Multiple Vitamins-Minerals (MULTIVITAMIN WITH MINERALS) tablet, Take 1 tablet by mouth daily., Disp: , Rfl:    omeprazole (PRILOSEC) 40 MG capsule, TAKE ONE CAPSULE BY MOUTH DAILY, Disp: 30 capsule, Rfl: 3   traMADol (ULTRAM) 50 MG tablet, 1 or 2 every 6 hours if needed for cough, Disp: 40 tablet, Rfl: 0   valsartan-hydrochlorothiazide (DIOVAN-HCT) 160-25 MG tablet, Take 1 tablet by mouth daily., Disp: 90 tablet, Rfl: 1   varenicline (CHANTIX) 1 MG tablet, TAKE 1 TABLET BY MOUTH TWICE A DAY, Disp: 180 tablet, Rfl: 1  Current Facility-Administered Medications:    0.9 %  sodium chloride infusion, 500 mL, Intravenous, Once, Pyrtle, Carie Caddy, MD  Allergies  Allergen Reactions   Clarithromycin Diarrhea, Rash and Other (See Comments)   Hydrocodone Bit-Homatrop Mbr Other (See Comments)    paranoia   Hydrocodone Bit-Homatrop Mbr Other (See Comments)    paranoia   Codeine Itching   Penicillins Other (See Comments)     Has patient had a PCN reaction causing immediate rash, facial/tongue/throat swelling, SOB or lightheadedness with hypotension: Unknown Has patient had a PCN reaction causing severe rash involving mucus membranes or skin necrosis: Unknown Has patient had a PCN reaction that required hospitalization: Unknown Has patient had a PCN reaction occurring within the last 10 years: No If all of the above answers are "NO", then may proceed with Cephalosporin use.    Topiramate Other (See Comments)    Other reaction(s): WEIGHT LOSS     Assessment and Plan:   Diagnosis: statin myalgia. Please see myChart communication and orders below.   No orders of the defined types were placed in this encounter.  Meds ordered this encounter  Medications   rosuvastatin (CRESTOR) 10 MG tablet    Sig: Take 1 tablet (10 mg total) by mouth  daily.    Dispense:  30 tablet    Refill:  3    Neena Rhymes, MD 10/11/2022  A total of 7 minutes were spent by me to personally review the patient-generated inquiry, review patient records and data pertinent to assessment of the patient's problem, develop a management plan including generation of prescriptions and/or orders, and on subsequent communication with the patient through secure the MyChart portal service.   There is no separately reported E/M service related to this service in the past 7 days nor does the patient have an upcoming soonest available appointment for this issue. This work was completed in less than 7 days.   The patient consented to this service today (see patient agreement prior to ongoing communication). Patient counseled regarding the need for in-person exam for certain conditions and was advised to call the office if any changing or worsening symptoms occur.   The codes to be used for the E/M service  are: [x]   99421 for 5-10 minutes of time spent on the inquiry. []   [id number] for 11-20 minutes. []   [id number] for 21+ minutes.

## 2022-10-15 ENCOUNTER — Other Ambulatory Visit: Payer: Self-pay | Admitting: Internal Medicine

## 2022-10-15 DIAGNOSIS — Z720 Tobacco use: Secondary | ICD-10-CM

## 2022-10-20 ENCOUNTER — Other Ambulatory Visit: Payer: Self-pay | Admitting: Family Medicine

## 2022-10-20 DIAGNOSIS — J302 Other seasonal allergic rhinitis: Secondary | ICD-10-CM

## 2022-11-08 ENCOUNTER — Ambulatory Visit: Payer: Medicare Other | Admitting: Internal Medicine

## 2022-12-01 ENCOUNTER — Encounter: Payer: Self-pay | Admitting: Internal Medicine

## 2022-12-05 ENCOUNTER — Other Ambulatory Visit: Payer: Self-pay

## 2022-12-05 MED ORDER — TRELEGY ELLIPTA 100-62.5-25 MCG/ACT IN AEPB: 1.0000 | INHALATION_SPRAY | Freq: Every day | RESPIRATORY_TRACT | 12 refills | Status: DC

## 2022-12-05 MED ORDER — MODAFINIL 200 MG PO TABS: 200.0000 mg | ORAL_TABLET | Freq: Every day | ORAL | 5 refills | Status: DC

## 2022-12-05 NOTE — Telephone Encounter (Signed)
Meds refilled.

## 2022-12-05 NOTE — Telephone Encounter (Signed)
Patient requesting refill for Modafinil 200mg    LOV 10/07/22  Dr.Young can you please advise .

## 2022-12-06 ENCOUNTER — Encounter: Payer: Self-pay | Admitting: Family Medicine

## 2022-12-06 ENCOUNTER — Ambulatory Visit: Payer: Medicare Other | Admitting: Family Medicine

## 2022-12-06 ENCOUNTER — Telehealth (HOSPITAL_BASED_OUTPATIENT_CLINIC_OR_DEPARTMENT_OTHER): Payer: Self-pay | Admitting: Family Medicine

## 2022-12-06 VITALS — BP 102/52 | HR 75 | Temp 97.7°F | Ht 62.0 in | Wt 133.2 lb

## 2022-12-06 DIAGNOSIS — E559 Vitamin D deficiency, unspecified: Secondary | ICD-10-CM | POA: Diagnosis not present

## 2022-12-06 DIAGNOSIS — F419 Anxiety disorder, unspecified: Secondary | ICD-10-CM

## 2022-12-06 DIAGNOSIS — Z780 Asymptomatic menopausal state: Secondary | ICD-10-CM

## 2022-12-06 DIAGNOSIS — I10 Essential (primary) hypertension: Secondary | ICD-10-CM | POA: Diagnosis not present

## 2022-12-06 DIAGNOSIS — Z0000 Encounter for general adult medical examination without abnormal findings: Secondary | ICD-10-CM

## 2022-12-06 DIAGNOSIS — R350 Frequency of micturition: Secondary | ICD-10-CM

## 2022-12-06 LAB — CBC WITH DIFFERENTIAL/PLATELET
Basophils Absolute: 0.1 10*3/uL (ref 0.0–0.1)
Basophils Relative: 1.3 % (ref 0.0–3.0)
Eosinophils Absolute: 0.3 10*3/uL (ref 0.0–0.7)
Eosinophils Relative: 4.4 % (ref 0.0–5.0)
HCT: 41 % (ref 36.0–46.0)
Hemoglobin: 13.7 g/dL (ref 12.0–15.0)
Lymphocytes Relative: 24.2 % (ref 12.0–46.0)
Lymphs Abs: 1.7 10*3/uL (ref 0.7–4.0)
MCHC: 33.4 g/dL (ref 30.0–36.0)
MCV: 99.3 fL (ref 78.0–100.0)
Monocytes Absolute: 0.7 10*3/uL (ref 0.1–1.0)
Monocytes Relative: 9.2 % (ref 3.0–12.0)
Neutro Abs: 4.3 10*3/uL (ref 1.4–7.7)
Neutrophils Relative %: 60.9 % (ref 43.0–77.0)
Platelets: 323 10*3/uL (ref 150.0–400.0)
RBC: 4.12 Mil/uL (ref 3.87–5.11)
RDW: 13.7 % (ref 11.5–15.5)
WBC: 7.1 10*3/uL (ref 4.0–10.5)

## 2022-12-06 LAB — HEPATIC FUNCTION PANEL
ALT: 20 U/L (ref 0–35)
AST: 18 U/L (ref 0–37)
Albumin: 4.3 g/dL (ref 3.5–5.2)
Alkaline Phosphatase: 84 U/L (ref 39–117)
Bilirubin, Direct: 0.1 mg/dL (ref 0.0–0.3)
Total Bilirubin: 0.3 mg/dL (ref 0.2–1.2)
Total Protein: 6.7 g/dL (ref 6.0–8.3)

## 2022-12-06 LAB — BASIC METABOLIC PANEL
BUN: 17 mg/dL (ref 6–23)
CO2: 35 meq/L — ABNORMAL HIGH (ref 19–32)
Calcium: 9.5 mg/dL (ref 8.4–10.5)
Chloride: 100 meq/L (ref 96–112)
Creatinine, Ser: 0.62 mg/dL (ref 0.40–1.20)
GFR: 93.12 mL/min (ref >60.00)
Glucose, Bld: 99 mg/dL (ref 70–99)
Potassium: 4.1 meq/L (ref 3.5–5.1)
Sodium: 141 meq/L (ref 135–145)

## 2022-12-06 LAB — POCT URINE DRUG SCREEN
Methylenedioxyamphetamine: NOT DETECTED
POC Amphetamine UR: NOT DETECTED
POC BENZODIAZEPINES UR: POSITIVE — AB
POC Barbiturate UR: NOT DETECTED
POC Cocaine UR: NOT DETECTED
POC Ecstasy UR: NOT DETECTED
POC Marijuana UR: NOT DETECTED
POC Methadone UR: NOT DETECTED
POC Methamphetamine UR: NOT DETECTED
POC Opiate Ur: NOT DETECTED
POC Oxycodone UR: NOT DETECTED
POC PHENCYCLIDINE UR: NOT DETECTED
POC TRICYCLICS UR: NOT DETECTED
URINE TEMPERATURE: 94 [degF] (ref 90.0–100.0)

## 2022-12-06 LAB — TSH: TSH: 2.24 u[IU]/mL (ref 0.35–5.50)

## 2022-12-06 LAB — LIPID PANEL
Cholesterol: 171 mg/dL (ref 0–200)
HDL: 70.1 mg/dL (ref >39.00)
LDL Cholesterol: 84 mg/dL (ref 0–99)
NonHDL: 101.39
Total CHOL/HDL Ratio: 2
Triglycerides: 85 mg/dL (ref 0.0–149.0)
VLDL: 17 mg/dL (ref 0.0–40.0)

## 2022-12-06 LAB — POCT URINALYSIS DIPSTICK
Bilirubin, UA: NEGATIVE
Blood, UA: NEGATIVE
Glucose, UA: NEGATIVE
Ketones, UA: NEGATIVE
Nitrite, UA: NEGATIVE
Protein, UA: NEGATIVE
Spec Grav, UA: >=1.03 — AB (ref 1.010–1.025)
Urobilinogen, UA: 0.2 U/dL
pH, UA: 6 (ref 5.0–8.0)

## 2022-12-06 LAB — VITAMIN D 25 HYDROXY (VIT D DEFICIENCY, FRACTURES): VITD: 34.03 ng/mL (ref 30.00–100.00)

## 2022-12-06 MED ORDER — ALPRAZOLAM 0.5 MG PO TABS: 0.5000 mg | ORAL_TABLET | Freq: Two times a day (BID) | ORAL | 1 refills | Status: AC | PRN

## 2022-12-06 NOTE — Patient Instructions (Signed)
Follow up in 6 months to recheck BP and cholesterol We'll notify you of your lab results and make any changes if needed Keep up the good work on healthy diet and regular exercise- you're doing great! Use the Alprazolam as needed for stress and anxiety Call with any questions or concerns Stay Safe!  Stay Healthy! Happy Thanksgiving!!!

## 2022-12-06 NOTE — Assessment & Plan Note (Signed)
Pt's PE unchanged from previous.  UTD on mammo, colonoscopy.  DEXA ordered.  UTD on shingles, PNA, flu.  Check labs.  Anticipatory guidance provided.

## 2022-12-06 NOTE — Assessment & Plan Note (Signed)
Chronic problem.  Excellent control.  Currently asymptomatic.  Check labs due to use of ARB and diuretic but no anticipated med changes.

## 2022-12-06 NOTE — Progress Notes (Signed)
Subjective:    Patient ID: Carol Quinn, female    DOB: November 07, 1955, 67 y.o.   MRN: [id number]  HPI CPE- UTD on mammo, colonoscopy.  Due for DEXA  Patient Care Team    Relationship Specialty Notifications Start End  Sheliah Hatch, MD PCP - General Family Medicine  07/17/10   Rollene Rotunda, MD PCP - Cardiology Cardiology  09/19/19   Laurann Montana., MD  Obstetrics and Gynecology  02/14/14   Sydnee Cabal., MD Referring Physician Gastroenterology  02/14/14   Caro Laroche, MD Referring Physician Ophthalmology  05/10/17   Sheliah Hatch, MD Referring Physician Family Medicine  07/10/19     Health Maintenance  Topic Date Due   Zoster Vaccines- Shingrix (2 of 2) 04/02/2019   DEXA SCAN  12/30/2021   Lung Cancer Screening  02/11/2022   Pneumonia Vaccine 23+ Years old (3 of 3 - PPSV23 or PCV20) 10/31/2022   Medicare Annual Wellness (AWV)  02/03/2023   COVID-19 Vaccine (6 - 2023-24 season) 12/13/2022   DTaP/Tdap/Td (2 - Td or Tdap) 12/22/2022   MAMMOGRAM  03/21/2023   Colonoscopy  03/21/2032   INFLUENZA VACCINE  Completed   Hepatitis C Screening  Completed   HIV Screening  Completed   HPV VACCINES  Aged Out      Review of Systems Patient reports no vision/ hearing changes, adenopathy,fever, weight change,  persistant/recurrent hoarseness , swallowing issues, chest pain, palpitations, edema, persistant/recurrent cough, hemoptysis, dyspnea (rest/exertional/paroxysmal nocturnal), gastrointestinal bleeding (melena, rectal bleeding), abdominal pain, significant heartburn, bowel changes, Gyn symptoms (abnormal  bleeding, pain),  syncope, focal weakness, memory loss, numbness & tingling, skin/hair/nail changes, abnormal bruising or bleeding.   + intermittent leg pain since COVID vaccine last month- has not taken any Tylenol or Ibuprofen.  Not occurring daily like the pain she had w/ cholesterol medication.  + irritability, feels overwhelmed- had similar episode 2 yrs ago and sxs  improved w/ a few doses of Alprazolam.  Has been using expired medication- took 3 in the last 2 weeks.  + urinary frequency     Objective:   Physical Exam General Appearance:    Alert, cooperative, no distress, appears stated age  Head:    Normocephalic, without obvious abnormality, atraumatic  Eyes:    L PERRL, EOMI  Ears:    Normal TM's and external ear canals, both ears  Nose:   Nares normal, septum midline, mucosa normal, no drainage    or sinus tenderness  Throat:   Lips, mucosa, and tongue normal; teeth and gums normal  Neck:   Supple, symmetrical, trachea midline, no adenopathy;    Thyroid: no enlargement/tenderness/nodules  Back:     Symmetric, no curvature, ROM normal, no CVA tenderness  Lungs:     Clear to auscultation bilaterally, respirations unlabored  Chest Wall:    No tenderness or deformity   Heart:    Regular rate and rhythm, S1 and S2 normal, no murmur, rub   or gallop  Breast Exam:    Deferred to GYN  Abdomen:     Soft, non-tender, bowel sounds active all four quadrants,    no masses, no organomegaly  Genitalia:    Deferred to GYN  Rectal:    Extremities:   Extremities normal, atraumatic, no cyanosis or edema  Pulses:   2+ and symmetric all extremities  Skin:   Skin color, texture, turgor normal, no rashes or lesions  Lymph nodes:   Cervical, supraclavicular, and axillary nodes normal  Neurologic:  CNII-XII intact, normal strength, sensation and reflexes    throughout          Assessment & Plan:  Anxiety- deteriorated.  Pt has hx of similar but had been doing well.  Reports she is currently overwhelmed, irritable.  Feels it is due to holiday stress.  Has been taking old, leftover Alprazolam w/ some relief.  Will refill for her to use during high stress moments.  Will reassess after the holidays and determine if sxs have improved/resolved or we need to revisit a daily medication.  Pt expressed understanding and is in agreement w/ plan.   Urinary frequency-  new.  Pt wonders if she has another UTI.  Will get UA and cx.  Will treat based on results.  Pt expressed understanding and is in agreement w/ plan.

## 2022-12-07 ENCOUNTER — Telehealth: Payer: Self-pay

## 2022-12-07 LAB — URINE CULTURE
MICRO NUMBER:: 15782104
Result:: NO GROWTH
SPECIMEN QUALITY:: ADEQUATE

## 2022-12-07 MED ORDER — CEPHALEXIN 500 MG PO CAPS: 500.0000 mg | ORAL_CAPSULE | Freq: Two times a day (BID) | ORAL | 0 refills | Status: AC

## 2022-12-07 NOTE — Telephone Encounter (Signed)
Patient is aware of her lab results. She questioned if she had a UTI I let her know the results of the UA we did in the office and that we sent it for culture and that would take a couple of days to come back. She wanted to know if Dr. Beverely Low wanted to send her in anything just in case since we were approaching the holiday weekend.

## 2022-12-07 NOTE — Telephone Encounter (Signed)
-----   Message from Neena Rhymes sent at 12/07/2022  7:26 AM EST ----- Labs look great!  No changes at this time

## 2022-12-07 NOTE — Telephone Encounter (Signed)
Noted  

## 2022-12-07 NOTE — Telephone Encounter (Signed)
Med was sent in via MyChart

## 2022-12-07 NOTE — Addendum Note (Signed)
Addended by: Sheliah Hatch on: 12/07/2022 03:48 PM   Modules accepted: Orders

## 2022-12-10 ENCOUNTER — Encounter: Payer: Self-pay | Admitting: Family Medicine

## 2022-12-14 ENCOUNTER — Ambulatory Visit (HOSPITAL_BASED_OUTPATIENT_CLINIC_OR_DEPARTMENT_OTHER)
Admission: RE | Admit: 2022-12-14 | Discharge: 2022-12-14 | Disposition: A | Discharge status: Home / Self Care | Payer: Medicare Other | Source: Ambulatory Visit | Attending: Family Medicine | Admitting: Family Medicine

## 2022-12-14 DIAGNOSIS — Z780 Asymptomatic menopausal state: Secondary | ICD-10-CM | POA: Insufficient documentation

## 2022-12-19 ENCOUNTER — Telehealth: Payer: Self-pay

## 2022-12-19 NOTE — Telephone Encounter (Signed)
-----   Message from Neena Rhymes sent at 12/19/2022  7:27 AM EST ----- Normal bone density test- great news!!!

## 2022-12-19 NOTE — Telephone Encounter (Signed)
Pt has reviewed via MyChart

## 2022-12-22 ENCOUNTER — Inpatient Hospital Stay: Payer: Medicare Other | Attending: Hematology & Oncology

## 2022-12-22 ENCOUNTER — Inpatient Hospital Stay (HOSPITAL_BASED_OUTPATIENT_CLINIC_OR_DEPARTMENT_OTHER): Payer: Medicare Other | Admitting: Hematology & Oncology

## 2022-12-22 ENCOUNTER — Encounter: Payer: Self-pay | Admitting: Hematology & Oncology

## 2022-12-22 VITALS — BP 134/43 | HR 88 | Temp 98.5°F | Resp 20 | Ht 62.0 in | Wt 133.0 lb

## 2022-12-22 DIAGNOSIS — Z85840 Personal history of malignant neoplasm of eye: Secondary | ICD-10-CM | POA: Diagnosis present

## 2022-12-22 DIAGNOSIS — C6931 Malignant neoplasm of right choroid: Secondary | ICD-10-CM

## 2022-12-22 LAB — CBC WITH DIFFERENTIAL (CANCER CENTER ONLY)
Abs Immature Granulocytes: 0.02 10*3/uL (ref 0.00–0.07)
Basophils Absolute: 0.1 10*3/uL (ref 0.0–0.1)
Basophils Relative: 1 %
Eosinophils Absolute: 0.3 10*3/uL (ref 0.0–0.5)
Eosinophils Relative: 4 %
HCT: 37.5 % (ref 36.0–46.0)
Hemoglobin: 12.6 g/dL (ref 12.0–15.0)
Immature Granulocytes: 0 %
Lymphocytes Relative: 25 %
Lymphs Abs: 1.9 10*3/uL (ref 0.7–4.0)
MCH: 33.2 pg (ref 26.0–34.0)
MCHC: 33.6 g/dL (ref 30.0–36.0)
MCV: 98.7 fL (ref 80.0–100.0)
Monocytes Absolute: 0.7 10*3/uL (ref 0.1–1.0)
Monocytes Relative: 9 %
Neutro Abs: 4.6 10*3/uL (ref 1.7–7.7)
Neutrophils Relative %: 61 %
Platelet Count: 274 10*3/uL (ref 150–400)
RBC: 3.8 MIL/uL — ABNORMAL LOW (ref 3.87–5.11)
RDW: 12.1 % (ref 11.5–15.5)
WBC Count: 7.6 10*3/uL (ref 4.0–10.5)
nRBC: 0 % (ref 0.0–0.2)

## 2022-12-22 LAB — CMP (CANCER CENTER ONLY)
ALT: 17 U/L (ref 0–44)
AST: 15 U/L (ref 15–41)
Albumin: 4.1 g/dL (ref 3.5–5.0)
Alkaline Phosphatase: 84 U/L (ref 38–126)
Anion gap: 9 (ref 5–15)
BUN: 15 mg/dL (ref 8–23)
CO2: 33 mmol/L — ABNORMAL HIGH (ref 22–32)
Calcium: 9.4 mg/dL (ref 8.9–10.3)
Chloride: 99 mmol/L (ref 98–111)
Creatinine: 0.69 mg/dL (ref 0.44–1.00)
GFR, Estimated: >60 mL/min (ref >60)
Glucose, Bld: 173 mg/dL — ABNORMAL HIGH (ref 70–99)
Potassium: 4 mmol/L (ref 3.5–5.1)
Sodium: 141 mmol/L (ref 135–145)
Total Bilirubin: 0.3 mg/dL (ref <1.2)
Total Protein: 6.5 g/dL (ref 6.5–8.1)

## 2022-12-22 LAB — LACTATE DEHYDROGENASE: LDH: 130 U/L (ref 98–192)

## 2022-12-22 NOTE — Progress Notes (Signed)
Hematology and Oncology Follow Up Visit  Carol Quinn [id number] 10-Nov-1955 67 y.o. 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  We last saw her back in August.  Since then, she has been doing pretty well.  She really has had no complaints.  She does have underlying COPD.  So far, this has not had any problems with exacerbations.  She has had no problems with nausea or vomiting.  There is been no change in bowel or bladder habits.  She has had no rashes.  There is been no leg swelling.  Thankfully, has been no issues with COVID.  Overall, I would have to say that her performance status is probably ECOG 1.   Medications:  Allergies as of 12/22/2022       Reactions   Clarithromycin Diarrhea, Rash, Other (See Comments)   Hydrocodone Bit-homatrop Mbr Other (See Comments)   paranoia   Hydrocodone Bit-homatrop Mbr Other (See Comments)   paranoia   Codeine Itching   Penicillins Other (See Comments)   Has patient had a PCN reaction causing immediate rash, facial/tongue/throat swelling, SOB or lightheadedness with hypotension: Unknown Has patient had a PCN reaction causing severe rash involving mucus membranes or skin necrosis: Unknown Has patient had a PCN reaction that required hospitalization: Unknown Has patient had a PCN reaction occurring within the last 10 years: No If all of the above answers are "NO", then may proceed with Cephalosporin use.   Topiramate Other (See Comments)   Other reaction(s): WEIGHT LOSS        Medication List        Accurate as of December 22, 2022  8:39 AM. If you have any questions, ask your nurse or doctor.          STOP taking these medications    levofloxacin 500 MG tablet Commonly known as:  LEVAQUIN Stopped by: Josph Macho       TAKE these medications    albuterol 108 (90 Base) MCG/ACT inhaler Commonly known as: VENTOLIN HFA Inhale 2 puffs into the lungs every 6 (six) hours as needed for wheezing or shortness of breath.   albuterol (2.5 MG/3ML) 0.083% nebulizer solution Commonly known as: PROVENTIL Take 3 mLs (2.5 mg total) by nebulization every 6 (six) hours as needed for wheezing or shortness of breath.   ALPRAZolam 0.5 MG tablet Commonly known as: XANAX Take 1 tablet (0.5 mg total) by mouth 2 (two) times daily as needed for anxiety.   aspirin EC 81 MG tablet Take 81 mg by mouth daily. Swallow whole.   aspirin-acetaminophen-caffeine 250-250-65 MG tablet Commonly known as: EXCEDRIN MIGRAINE Take 2 tablets by mouth every 6 (six) hours as needed for headache.   buPROPion 150 MG 24 hr tablet Commonly known as: WELLBUTRIN XL TAKE ONE TABLET BY MOUTH IN THE MORNING   escitalopram 20 MG tablet Commonly known as: LEXAPRO TAKE ONE TABLET BY MOUTH DAILY   estradiol 0.0375 MG/24HR Commonly known as: VIVELLE-DOT Place 1 patch onto the skin 2 (two) times a week.   fexofenadine 180 MG tablet Commonly known as: ALLEGRA Take 180 mg by mouth daily.   modafinil 200 MG tablet Commonly known as: PROVIGIL Take 1 tablet (200 mg total) by mouth daily.   montelukast 10 MG tablet Commonly known as:  SINGULAIR TAKE ONE TABLET BY MOUTH DAILY AT BEDTIME   multivitamin with minerals tablet Take 1 tablet by mouth daily.   omeprazole 40 MG capsule Commonly known as: PRILOSEC TAKE ONE CAPSULE BY MOUTH DAILY   PreviDent 5000 Booster Plus 1.1 % Pste Generic drug: Sodium Fluoride See admin instructions.   rosuvastatin 10 MG tablet Commonly known as: Crestor Take 1 tablet (10 mg total) by mouth daily.   traMADol 50 MG tablet Commonly known as: ULTRAM 1 or 2 every 6 hours if needed for cough   Trelegy Ellipta 100-62.5-25 MCG/ACT Aepb Generic drug:  Fluticasone-Umeclidin-Vilant Inhale 1 puff into the lungs daily. Rinse mouth   valsartan-hydrochlorothiazide 160-25 MG tablet Commonly known as: DIOVAN-HCT Take 1 tablet by mouth daily.   varenicline 1 MG tablet Commonly known as: CHANTIX TAKE 1 TABLET BY MOUTH TWICE A DAY        Allergies:  Allergies  Allergen Reactions   Clarithromycin Diarrhea, Rash and Other (See Comments)   Hydrocodone Bit-Homatrop Mbr Other (See Comments)    paranoia   Hydrocodone Bit-Homatrop Mbr Other (See Comments)    paranoia   Codeine Itching   Penicillins Other (See Comments)     Has patient had a PCN reaction causing immediate rash, facial/tongue/throat swelling, SOB or lightheadedness with hypotension: Unknown Has patient had a PCN reaction causing severe rash involving mucus membranes or skin necrosis: Unknown Has patient had a PCN reaction that required hospitalization: Unknown Has patient had a PCN reaction occurring within the last 10 years: No If all of the above answers are "NO", then may proceed with Cephalosporin use.    Topiramate Other (See Comments)    Other reaction(s): WEIGHT LOSS     Past Medical History, Surgical history, Social history, and Family History were reviewed and updated.  Review of Systems: Review of Systems  Constitutional: Negative.   HENT: Negative.    Eyes: Negative.   Respiratory: Negative.    Cardiovascular: Negative.   Gastrointestinal:  Positive for abdominal pain.  Genitourinary: Negative.   Musculoskeletal: Negative.   Skin: Negative.   Neurological: Negative.   Endo/Heme/Allergies: Negative.   Psychiatric/Behavioral: Negative.        Physical Exam:  height is 5\' 2"  (1.575 m) and weight is 133 lb (60.3 kg). Her oral temperature is 98.5 F (36.9 C). Her blood pressure is 134/43 (abnormal) and her pulse is 88. Her respiration is 20 and oxygen saturation is 95%.   Wt Readings from Last 3 Encounters:  12/22/22 133 lb (60.3 kg)  12/06/22 133  lb 4 oz (60.4 kg)  10/07/22 134 lb (60.8 kg)    Physical Exam Vitals reviewed.  HENT:     Head: Normocephalic and atraumatic.  Eyes:     Pupils: Pupils are equal, round, and reactive to light.     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  Left eye is unremarkable.  She has good extraocular muscle movement of the left eye.  Cardiovascular:     Rate and Rhythm: Normal rate and regular rhythm.     Heart sounds: Normal heart sounds.  Pulmonary:     Effort: Pulmonary effort is normal.     Breath sounds: Normal breath sounds.  Abdominal:     General: Bowel sounds are normal.     Palpations: Abdomen is soft.  Musculoskeletal:        General: No tenderness or deformity. Normal range of motion.     Cervical back: Normal range of motion.  Lymphadenopathy:     Cervical: No cervical adenopathy.  Skin:    General: Skin is warm and dry.     Findings: No erythema or rash.  Neurological:     Mental Status: She is alert and oriented to person, place, and time.  Psychiatric:        Behavior: Behavior normal.        Thought Content: Thought content normal.        Judgment: Judgment normal.      Lab Results  Component Value Date   WBC 7.6 12/22/2022   HGB 12.6 12/22/2022   HCT 37.5 12/22/2022   MCV 98.7 12/22/2022   PLT 274 12/22/2022   No results found for: "FERRITIN", "IRON", "TIBC", "UIBC", "IRONPCTSAT" Lab Results  Component Value Date   RBC 3.80 (L) 12/22/2022   No results found for: "KPAFRELGTCHN", "LAMBDASER", "KAPLAMBRATIO" No results found for: "IGGSERUM", "IGA", "IGMSERUM" No results found for: "TOTALPROTELP", "ALBUMINELP", "A1GS", "A2GS", "BETS", "BETA2SER", "GAMS", "MSPIKE", "SPEI"   Chemistry      Component Value Date/Time   NA 141 12/22/2022 0748   NA 143 12/23/2016 0848   K 4.0 12/22/2022 0748   K 4.3 12/23/2016 0848   CL 99 12/22/2022 0748   CL 99 12/23/2016 0848   CO2 33 (H) 12/22/2022 0748   CO2 31 12/23/2016 0848   BUN 15 12/22/2022  0748   BUN 10 12/23/2016 0848   CREATININE 0.69 12/22/2022 0748   CREATININE 1.0 12/23/2016 0848      Component Value Date/Time   CALCIUM 9.4 12/22/2022 0748   CALCIUM 9.4 12/23/2016 0848   ALKPHOS 84 12/22/2022 0748   ALKPHOS 99 (H) 12/23/2016 0848   AST 15 12/22/2022 0748   ALT 17 12/22/2022 0748   ALT 26 12/23/2016 0848   BILITOT 0.3 12/22/2022 0748      Impression and Plan: Ms. 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   So far, things look fantastic.  I do not see any evidence of metastatic disease.  Right now, I do not see that we have to do any scans on her.  Is been 7 years that she had had the enucleation.  We will plan to get her back in another 4 months.  We will get her through Winter.    Josph Macho, MD 12/12/20248:39 AM

## 2023-01-11 ENCOUNTER — Encounter: Payer: Self-pay | Admitting: Internal Medicine

## 2023-01-15 ENCOUNTER — Encounter: Payer: Self-pay | Admitting: Family Medicine

## 2023-01-16 ENCOUNTER — Telehealth: Payer: Self-pay

## 2023-01-16 ENCOUNTER — Telehealth: Payer: Self-pay | Admitting: *Deleted

## 2023-01-16 NOTE — Transitions of Care (Post Inpatient/ED Visit) (Signed)
   01/16/2023  Name: Carol Quinn MRN: [id number] DOB: Oct 22, 1955  Today's TOC FU Call Status: Today's TOC FU Call Status:: Successful TOC FU Call Completed TOC FU Call Complete Date: 01/16/23 Patient's Name and Date of Birth confirmed.  Transition Care Management Follow-up Telephone Call Date of Discharge: 01/14/23 Discharge Facility: [facility] Type of Discharge: Inpatient Admission Primary Inpatient Discharge Diagnosis:: COPD exacerbation How have you been since you were released from the hospital?: Same Any questions or concerns?: Yes Patient Questions/Concerns:: Patient is wanting to do a virtual appt and get some stronger atbx Patient Questions/Concerns Addressed: Notified Provider of Patient Questions/Concerns  Items Reviewed: Did you receive and understand the discharge instructions provided?: Yes Medications obtained,verified, and reconciled?: Yes (Medications Reviewed) Any new allergies since your discharge?: No Dietary orders reviewed?: No Do you have support at home?: Yes People in Home: spouse Name of Support/Comfort Primary Source: Carol Quinn  Medications Reviewed Today: Medications Reviewed Today   Medications were not reviewed in this encounter     Home Care and Equipment/Supplies: Were Home Health Services Ordered?: NA Any new equipment or medical supplies ordered?: NA  Functional Questionnaire: Do you need assistance with bathing/showering or dressing?: No Do you need assistance with meal preparation?: No Do you need assistance with eating?: No Do you have difficulty maintaining continence: No Do you need assistance with getting out of bed/getting out of a chair/moving?: No Do you have difficulty managing or taking your medications?: No  Follow up appointments reviewed: PCP Follow-up appointment confirmed?: Yes Date of PCP follow-up appointment?: 01/17/23 Follow-up Provider: Kenney Quinn cirtual appt 8:15 [facility] Follow-up  appointment confirmed?: NA Do you need transportation to your follow-up appointment?: No Do you understand care options if your condition(s) worsen?: Yes-patient verbalized understanding  SDOH Interventions Today    Flowsheet Row Most Recent Value  SDOH Interventions   Food Insecurity Interventions Intervention Not Indicated  Housing Interventions Intervention Not Indicated  Transportation Interventions Intervention Not Indicated, Patient Resources (Friends/Family)  Utilities Interventions Intervention Not Indicated      Interventions Today    Flowsheet Row Most Recent Value  Chronic Disease   Chronic disease during today's visit Chronic Obstructive Pulmonary Disease (COPD)  General Interventions   General Interventions Discussed/Reviewed General Interventions Discussed, General Interventions Reviewed, Doctor Visits, Communication with  Doctor Visits Discussed/Reviewed Doctor Visits Discussed, Doctor Visits Reviewed, PCP  [Patient stated her symptoms was not any better and she didn't feel like she could go into the office and wanted a virtual appt and some stronger atbx. RN contacted PCP]  Communication with PCP/Specialists  [DR Tabori office set up virtual appt with Carol Roys NP 8:15 on [phone]]  Nutrition Interventions   Nutrition Discussed/Reviewed Fluid intake  Pharmacy Interventions   Pharmacy Dicussed/Reviewed Pharmacy Topics Discussed, Pharmacy Topics Reviewed      No further follow up needed Dr intake  person sent DR Carol Quinn information on patient requesting stronger atbx.   Carol Quinn BSN RN Population Health- Transition of Care Team.  [facility] [phone]

## 2023-01-16 NOTE — Telephone Encounter (Signed)
 Copied from CRM [id number]. Topic: Clinical - Medical Advice >> Jan 16, 2023 10:52 AM Carol Quinn wrote: Reason for CRM: Cathlean Headland stated that Pt was discharged on 01/14/2023 and stated that she doesn't feel any better and is wanting to know if a stronger antibiotic can be called in. Patient is still coughing up green mucus.

## 2023-01-16 NOTE — Telephone Encounter (Signed)
 Copied from CRM [id number]. Topic: General - Call Back - No Documentation >> Jan 16, 2023 12:26 PM Carol Quinn wrote: Reason for CRM: Pt stated that she is needing a callback to discuss why she is not able to received an antibiotic without having to come into the office, I did inform the pt that the office did state that the PCP would need to confirm if its a viral or bacterial, she would like to  listen to her to make sure its safe for you to take anything. Pt stated that she wanted to cancel her upcoming tele visit on 01/17/23 because she is not physically coming into the office. Pt stated that she would like to speak with [place] only her PCP.

## 2023-01-16 NOTE — Telephone Encounter (Signed)
 Pt call stating she only wishes to speak to PCP

## 2023-01-16 NOTE — Telephone Encounter (Signed)
 I was present for most of the phone call and can attest that Tonya (CMA) handled it very professionally.  And I agree that given the fact that she was d/c'd against the advice of the hospital and her infection was viral and not bacterial that 1) she needs to be seen in office to get an accurate O2 sat, respiratory rate, and a complete lung exam and 2) antibiotics are not appropriate in a viral illness (see excerpt from D/C summary below).  Since I don't have any appts today or tomorrow, she should either see one of our available providers or call her pulmonologist for an evaluation.  And I will note that cursing at anyone- whether on the phone or in person will not be tolerated.  Per her d/c summary- 'She was provided initially with ceftriaxone  and doxycycline  but antibiotics were discontinued due to viral etiology. On 01/14/2023, patient stated that she wanted to go home. I expressed my concern that she still has significant wheezing but she still wanted to go home. She also declined hospitalist at home follow-up. She stated she has nebulizers at home and can take the steroids at home. She was prescribed prednisone , Tessalon  and guaifenesin  and discharged home per her request'

## 2023-01-16 NOTE — Telephone Encounter (Signed)
 Please see previous phone note.  I already explained that pt will not get abx without an appt bc per her hospital notes, she has a viral illness.  So right now, based on the information I have, that is not appropriate care.  I will not call her to discuss this as this message was relayed by 2 different clinical staff members and the answer will not change.  Please make her aware of our office procedures and that most communication- unless there is something very serious to discuss- are done by staff while we are seeing patients.

## 2023-01-17 ENCOUNTER — Telehealth: Payer: Self-pay

## 2023-01-17 ENCOUNTER — Telehealth: Payer: Medicare Other | Admitting: Family

## 2023-01-17 ENCOUNTER — Other Ambulatory Visit: Payer: Self-pay | Admitting: Family

## 2023-01-17 DIAGNOSIS — J441 Chronic obstructive pulmonary disease with (acute) exacerbation: Secondary | ICD-10-CM | POA: Diagnosis not present

## 2023-01-17 DIAGNOSIS — Z09 Encounter for follow-up examination after completed treatment for conditions other than malignant neoplasm: Secondary | ICD-10-CM

## 2023-01-17 DIAGNOSIS — R051 Acute cough: Secondary | ICD-10-CM | POA: Diagnosis not present

## 2023-01-17 MED ORDER — TRAMADOL HCL 50 MG PO TABS: ORAL_TABLET | ORAL | 0 refills | Status: DC

## 2023-01-17 MED ORDER — PREDNISONE 20 MG PO TABS: 40.0000 mg | ORAL_TABLET | Freq: Every day | ORAL | 0 refills | Status: DC

## 2023-01-17 MED ORDER — DOXYCYCLINE HYCLATE 100 MG PO TABS: 100.0000 mg | ORAL_TABLET | Freq: Two times a day (BID) | ORAL | 0 refills | Status: DC

## 2023-01-17 NOTE — Progress Notes (Signed)
 Virtual Visit via Video   I connected with patient on 01/17/23 at  8:15 AM EST by a video enabled telemedicine application and verified that I am speaking with the correct person using two identifiers.  Location patient: Home Location provider: [facility], Office Persons participating in the virtual visit: Patient, Provider, CMA  I discussed the limitations of evaluation and management by telemedicine and the availability of in person appointments. The patient expressed understanding and agreed to proceed.  Subjective:   HPI:   68 year old female with a history of COPD stage IV  presents virtually post hospital discharge on 01/12/2023 with persistent cough, brown phlegm, and congestion. She is chronically on home O2 at 3L and taking Prednisone , using nebulizer treatments, and inhalers as needed. She reports feeling some better today as compared to being hospitalized but concerned about her cough and phlegm production. She is requesting a refill on Tramadol  that her pulmonologist gives her to help with the cough. Reports being positive for a type of coronavirus while hospitalized. No fever.  ROS:   See pertinent positives and negatives per HPI.  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   Abnormal CT scan of lung 10/29/2020   Female stress incontinence 10/15/2020   Headache 05/20/2020   Dysfunction of both eustachian tubes 03/16/2020   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   Hollenhorst plaque, left eye 06/27/2019    Trigger finger of right thumb 02/19/2018   Vitamin D  deficiency 10/03/2017   Special screening for malignant neoplasms, colon    Benign neoplasm of sigmoid colon    Benign neoplasm of descending colon    Hyperlipidemia 06/28/2016   Aortic atherosclerosis (HCC) 10/26/2015   Benign neoplasm of left choroid 10/23/2015   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   Chronic respiratory failure with hypoxia (HCC) 03/05/2015   Balance disorder 05/17/2013   Routine general medical examination at a health care facility 12/21/2012   COPD exacerbation (HCC) 04/08/2011   HTN (hypertension) 04/08/2011   Tobacco abuse 04/08/2011   Eye dryness 08/20/2010   Depression 05/25/2010   Migraine with aura 05/25/2010   Hypersomnia 10/31/2008   STRESS INCONTINENCE 10/31/2008   DYSPNEA 06/27/2008   Seasonal and perennial allergic rhinitis 05/03/2008   G E R D 05/03/2008   COPD mixed type (HCC) 04/18/2008    Social History   Tobacco Use   Smoking status: Former    Current packs/day: 0.00    Average packs/day: 0.5 packs/day for 40.0 years (20.0 ttl pk-yrs)    Types: Cigarettes    Start date: 08/1981    Quit date: 08/2021    Years since quitting: 1.4    Passive exposure: Current   Smokeless tobacco: Never  Substance Use Topics   Alcohol use: Yes    Alcohol/week: 0.0 standard drinks of alcohol    Comment: social    Current Outpatient Medications:    albuterol  (PROVENTIL ) (2.5 MG/3ML) 0.083% nebulizer solution, Take 3 mLs (2.5 mg total) by nebulization  every 6 (six) hours as needed for wheezing or shortness of breath., Disp: 150 mL, Rfl: 11   albuterol  (VENTOLIN  HFA) 108 (90 Base) MCG/ACT inhaler, Inhale 2 puffs into the lungs every 6 (six) hours as needed for wheezing or shortness of breath., Disp: 8 g, Rfl: 12   ALPRAZolam  (XANAX ) 0.5 MG tablet, Take 1 tablet (0.5 mg total) by mouth 2 (two) times daily as needed for anxiety., Disp: 30  tablet, Rfl: 1   aspirin EC 81 MG tablet, Take 81 mg by mouth daily. Swallow whole., Disp: , Rfl:    aspirin-acetaminophen -caffeine (EXCEDRIN MIGRAINE) 250-250-65 MG tablet, Take 2 tablets by mouth every 6 (six) hours as needed for headache., Disp: , Rfl:    buPROPion  (WELLBUTRIN  XL) 150 MG 24 hr tablet, TAKE ONE TABLET BY MOUTH IN THE MORNING, Disp: 90 tablet, Rfl: 1   doxycycline  (VIBRA -TABS) 100 MG tablet, Take 1 tablet (100 mg total) by mouth 2 (two) times daily., Disp: 28 tablet, Rfl: 0   escitalopram  (LEXAPRO ) 20 MG tablet, TAKE ONE TABLET BY MOUTH DAILY, Disp: 30 tablet, Rfl: 6   estradiol (VIVELLE-DOT) 0.0375 MG/24HR, Place 1 patch onto the skin 2 (two) times a week., Disp: , Rfl:    fexofenadine  (ALLEGRA ) 180 MG tablet, Take 180 mg by mouth daily., Disp: , Rfl:    Fluticasone -Umeclidin-Vilant (TRELEGY ELLIPTA ) 100-62.5-25 MCG/ACT AEPB, Inhale 1 puff into the lungs daily. Rinse mouth, Disp: 60 each, Rfl: 12   modafinil  (PROVIGIL ) 200 MG tablet, Take 1 tablet (200 mg total) by mouth daily., Disp: 30 tablet, Rfl: 5   montelukast  (SINGULAIR ) 10 MG tablet, TAKE ONE TABLET BY MOUTH DAILY AT BEDTIME, Disp: 30 tablet, Rfl: 3   Multiple Vitamins-Minerals (MULTIVITAMIN WITH MINERALS) tablet, Take 1 tablet by mouth daily., Disp: , Rfl:    omeprazole  (PRILOSEC) 40 MG capsule, TAKE ONE CAPSULE BY MOUTH DAILY, Disp: 30 capsule, Rfl: 3   PREVIDENT 5000 BOOSTER PLUS 1.1 % PSTE, See admin instructions., Disp: , Rfl:    valsartan -hydrochlorothiazide  (DIOVAN -HCT) 160-25 MG tablet, Take 1 tablet by mouth daily., Disp: 90 tablet, Rfl: 1   varenicline  (CHANTIX ) 1 MG tablet, TAKE 1 TABLET BY MOUTH TWICE A DAY, Disp: 180 tablet, Rfl: 1   rosuvastatin  (CRESTOR ) 10 MG tablet, Take 1 tablet (10 mg total) by mouth daily., Disp: 30 tablet, Rfl: 3   traMADol  (ULTRAM ) 50 MG tablet, 1 or 2 every 6 hours if needed for cough, Disp: 40 tablet, Rfl: 0  Allergies  Allergen Reactions   Clarithromycin Diarrhea, Rash and  Other (See Comments)   Hydrocodone  Bit-Homatrop Mbr Other (See Comments)    paranoia   Hydrocodone  Bit-Homatrop Mbr Other (See Comments)    paranoia   Codeine Itching   Penicillins Other (See Comments)     Has patient had a PCN reaction causing immediate rash, facial/tongue/throat swelling, SOB or lightheadedness with hypotension: Unknown Has patient had a PCN reaction causing severe rash involving mucus membranes or skin necrosis: Unknown Has patient had a PCN reaction that required hospitalization: Unknown Has patient had a PCN reaction occurring within the last 10 years: No If all of the above answers are NO, then may proceed with Cephalosporin use.    Topiramate Other (See Comments)    Other reaction(s): WEIGHT LOSS     Objective:   There were no vitals taken for this visit.  Patient is well-developed, well-nourished in no acute distress.  Resting comfortably at home.  Head is normocephalic, atraumatic.  No labored breathing.  Speech is  clear and coherent with logical content.  Patient is alert and oriented at baseline.    Assessment and Plan:    [facility] was seen today for hospitalization follow-up.  Diagnoses and all orders for this visit:  COPD exacerbation (HCC)  Acute cough  Hospital discharge follow-up  Other orders -     doxycycline  (VIBRA -TABS) 100 MG tablet; Take 1 tablet (100 mg total) by mouth 2 (two) times daily. -     traMADol  (ULTRAM ) 50 MG tablet; 1 or 2 every 6 hours if needed for cough   Patient advised to report to the ED if symptoms worsen or persist. Recheck as scheduled and sooner as needed. Discuss PCN allergy (that she says is not accurate) with PCP. I did not remove it as an allergy since it is documented as causing SOB and throat swelling.    Hailley Byers B Stevens Magwood, FNP 01/17/2023  Time spent with the patient: 25 minutes, of which >50% was spent in obtaining information about symptoms, reviewing previous labs, evaluations, and treatments,  counseling about condition (please see the discussed topics above), and developing a plan to further investigate it; had a number of questions which I addressed.

## 2023-01-17 NOTE — Telephone Encounter (Signed)
 Patient is requesting Quinn Rx for Prednisone  20mg  be sent in, this is not on patients active list. Please advise.    Copied from CRM [id number]. Topic: Clinical - Medication Question >> Jan 17, 2023  8:40 AM Carol Quinn wrote: Reason for CRM: Patient just completed MyChart visit, forgot to mention she would like Quinn refill of her Prednisone  20mg .

## 2023-01-17 NOTE — Telephone Encounter (Signed)
 Patient was seen this morning via video with padonda.

## 2023-01-18 ENCOUNTER — Other Ambulatory Visit: Payer: Self-pay | Admitting: Internal Medicine

## 2023-01-19 MED ORDER — EZETIMIBE 10 MG PO TABS: 10.0000 mg | ORAL_TABLET | Freq: Every day | ORAL | 3 refills | Status: DC

## 2023-01-19 NOTE — Telephone Encounter (Signed)
 Patient called on Jan 6 left a message that she was ill and was admitted to the hospital on Thursday 1/2. Is still sick wanted a follow up appointment. Will schedule appointment.

## 2023-01-20 MED ORDER — AMOXICILLIN-POT CLAVULANATE 875-125 MG PO TABS: 1.0000 | ORAL_TABLET | Freq: Two times a day (BID) | ORAL | 0 refills | Status: DC

## 2023-01-20 MED ORDER — PREDNISONE 10 MG PO TABS: ORAL_TABLET | ORAL | 0 refills | Status: DC

## 2023-01-20 NOTE — Telephone Encounter (Signed)
 I have received the following message from pt    Dr Neysa,     I have been sick with some type of respiratory infection for 8 days.  I started out taking Sudafed for the runny nose which did absolutely nothing but make the mucus hard.  I am also doing my nebulizer treatments every 4 to 5 hours.  I am coughing up green mucus and blowing out green mucus.  I have had a headache all eight days.  I may have had a fever the first day but I haven't had a fever since.  My breathing is labored.  Can you please call me in an antibiotic and prednisone ?  Please either give me a strong antibiotic like  augmentin  or give me a cycle of two weeks to get rid of this?  I feel absolutely terrible.  Please respond so that I know how to proceed.  Thank you, Carol Quinn     Please advise, pt did go to ED 01/12/23

## 2023-01-20 NOTE — Telephone Encounter (Signed)
 I am sorry I had not seen any communication till today.  I am sending augmentin and a prednisone taper to [facility].  Hope this helps quickly. Had you been tested for flu, Covid?

## 2023-01-29 ENCOUNTER — Other Ambulatory Visit: Payer: Self-pay | Admitting: Family Medicine

## 2023-01-29 DIAGNOSIS — F32A Depression, unspecified: Secondary | ICD-10-CM

## 2023-02-08 ENCOUNTER — Ambulatory Visit (INDEPENDENT_AMBULATORY_CARE_PROVIDER_SITE_OTHER): Payer: Medicare Other

## 2023-02-08 VITALS — Ht 62.0 in | Wt 133.0 lb

## 2023-02-08 DIAGNOSIS — Z0000 Encounter for general adult medical examination without abnormal findings: Secondary | ICD-10-CM

## 2023-02-08 NOTE — Progress Notes (Signed)
Subjective:   Carol Quinn is a 68 y.o. female who presents for Medicare Annual (Subsequent) preventive examination.  Visit Complete: Virtual I connected with  Carol Quinn on 02/10/23 by a audio enabled telemedicine application and verified that I am speaking with the correct person using two identifiers.  Patient Location: Home  Provider Location: Office/Clinic  I discussed the limitations of evaluation and management by telemedicine. The patient expressed understanding and agreed to proceed.  Vital Signs: Because this visit was a virtual/telehealth visit, some criteria may be missing or patient reported. Any vitals not documented were not able to be obtained and vitals that have been documented are patient reported.  Patient Medicare AWV questionnaire was completed by the patient on 02/01/2023; I have confirmed that all information answered by patient is correct and no changes since this date.  Cardiac Risk Factors include: advanced age (>29men, >70 women);hypertension;Other (see comment);dyslipidemia, Risk factor comments: Aortic atherosclerosis, CPOD     Objective:    Today's Vitals   02/08/23 1344  Weight: 133 lb (60.3 kg)  Height: 5\' 2"  (1.575 m)   Body mass index is 24.33 kg/m.     02/08/2023    1:57 PM 12/22/2022    7:58 AM 08/12/2022    8:37 AM 03/22/2022    7:12 AM 02/11/2022    8:33 AM 02/02/2022   12:57 PM 08/11/2021    8:35 AM  Advanced Directives  Does Patient Have a Medical Advance Directive? Yes Yes Yes No Yes Yes Yes  Type of Estate agent of [place];Living will Healthcare Power of [place];Living will Healthcare Power of [place];Living will  Healthcare Power of [place];Living will Healthcare Power of [place];Living will Living will;Healthcare Power of Attorney  Does patient want to make changes to medical advance directive?   No - Patient declined    No - Patient declined  Copy of Healthcare Power of Attorney in Chart? No - copy  requested  No - copy requested  No - copy requested No - copy requested   Would patient like information on creating a medical advance directive?    No - Patient declined       Current Medications (verified) Outpatient Encounter Medications as of 02/08/2023  Medication Sig   albuterol (PROVENTIL) (2.5 MG/3ML) 0.083% nebulizer solution Take 3 mLs (2.5 mg total) by nebulization every 6 (six) hours as needed for wheezing or shortness of breath.   albuterol (VENTOLIN HFA) 108 (90 Base) MCG/ACT inhaler Inhale 2 puffs into the lungs every 6 (six) hours as needed for wheezing or shortness of breath.   ALPRAZolam (XANAX) 0.5 MG tablet Take 1 tablet (0.5 mg total) by mouth 2 (two) times daily as needed for anxiety.   aspirin EC 81 MG tablet Take 81 mg by mouth daily. Swallow whole.   aspirin-acetaminophen-caffeine (EXCEDRIN MIGRAINE) 250-250-65 MG tablet Take 2 tablets by mouth every 6 (six) hours as needed for headache.   buPROPion (WELLBUTRIN XL) 150 MG 24 hr tablet TAKE ONE TABLET BY MOUTH IN THE MORNING   doxycycline (VIBRA-TABS) 100 MG tablet Take 1 tablet (100 mg total) by mouth 2 (two) times daily.   escitalopram (LEXAPRO) 20 MG tablet TAKE ONE TABLET BY MOUTH DAILY   estradiol (VIVELLE-DOT) 0.0375 MG/24HR Place 1 patch onto the skin 2 (two) times a week.   ezetimibe (ZETIA) 10 MG tablet Take 1 tablet (10 mg total) by mouth daily.   fexofenadine (ALLEGRA) 180 MG tablet Take 180 mg by mouth daily.   Fluticasone-Umeclidin-Vilant (  TRELEGY ELLIPTA) 100-62.5-25 MCG/ACT AEPB Inhale 1 puff into the lungs daily. Rinse mouth   modafinil (PROVIGIL) 200 MG tablet Take 1 tablet (200 mg total) by mouth daily.   montelukast (SINGULAIR) 10 MG tablet TAKE ONE TABLET BY MOUTH DAILY AT BEDTIME   Multiple Vitamins-Minerals (MULTIVITAMIN WITH MINERALS) tablet Take 1 tablet by mouth daily.   omeprazole (PRILOSEC) 40 MG capsule TAKE ONE CAPSULE BY MOUTH DAILY   predniSONE (DELTASONE) 20 MG tablet Take 2 tablets (40 mg  total) by mouth daily with breakfast. (Patient not taking: Reported on 02/08/2023)   PREVIDENT 5000 BOOSTER PLUS 1.1 % PSTE See admin instructions.   valsartan-hydrochlorothiazide (DIOVAN-HCT) 160-25 MG tablet Take 1 tablet by mouth daily.   varenicline (CHANTIX) 1 MG tablet TAKE 1 TABLET BY MOUTH TWICE A DAY   amoxicillin-clavulanate (AUGMENTIN) 875-125 MG tablet Take 1 tablet by mouth 2 (two) times daily. (Patient not taking: Reported on 02/08/2023)   predniSONE (DELTASONE) 10 MG tablet 4 X 2 DAYS, 3 X 2 DAYS, 2 X 2 DAYS, 1 X 2 DAYS (Patient not taking: Reported on 02/08/2023)   rosuvastatin (CRESTOR) 10 MG tablet take ONE tablet by MOUTH daily (Patient not taking: Reported on 02/08/2023)   traMADol (ULTRAM) 50 MG tablet 1 or 2 every 6 hours if needed for cough (Patient not taking: Reported on 02/08/2023)   No facility-administered encounter medications on file as of 02/08/2023.    Allergies (verified) Clarithromycin, Hydrocodone bit-homatrop mbr, Hydrocodone bit-homatrop mbr, Codeine, Penicillins, and Topiramate   History: Past Medical History:  Diagnosis Date   Acute bronchitis    Allergic rhinitis    Allergy    Anxiety    Aortic atherosclerosis (HCC)    Cancer (HCC)    Chronic headaches    COPD (chronic obstructive pulmonary disease) (HCC)    emphysema   Depression    Diverticulosis    Emphysema of lung (HCC)    Emphysema, unspecified (HCC)    Exposure to TB    uncle-her PPD neg   GERD (gastroesophageal reflux disease)    Hepatic cyst    left   Hiatal hernia    Hyperlipidemia    Hypertension    MDS (myelodysplastic syndrome), low grade (HCC) 09/23/2016   Ocular melanoma, 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   Oxygen deficiency    RBBB (right bundle branch block with left anterior fascicular block)    Tubular adenoma of colon    Past Surgical History:  Procedure Laterality Date   BIOPSY  03/22/2022   Procedure: BIOPSY;  Surgeon: Beverley Fiedler, MD;  Location: [facility];   Service: Gastroenterology;;   BREAST BIOPSY Left 2002   COLONOSCOPY WITH PROPOFOL N/A 03/21/2017   Procedure: COLONOSCOPY WITH PROPOFOL;  Surgeon: Beverley Fiedler, MD;  Location: [facility];  Service: Gastroenterology;  Laterality: N/A;   COLONOSCOPY WITH PROPOFOL N/A 03/22/2022   Procedure: COLONOSCOPY WITH PROPOFOL;  Surgeon: Beverley Fiedler, MD;  Location: [facility];  Service: Gastroenterology;  Laterality: N/A;   ESOPHAGOGASTRODUODENOSCOPY (EGD) WITH PROPOFOL N/A 03/22/2022   Procedure: ESOPHAGOGASTRODUODENOSCOPY (EGD) WITH PROPOFOL;  Surgeon: Beverley Fiedler, MD;  Location: [facility];  Service: Gastroenterology;  Laterality: N/A;   NOSE SURGERY     "nasal deviation"   POLYPECTOMY  03/22/2022   Procedure: POLYPECTOMY;  Surgeon: Beverley Fiedler, MD;  Location: [facility];  Service: Gastroenterology;;   TOTAL ABDOMINAL HYSTERECTOMY     TRIGGER FINGER RELEASE Right 06/05/2018   Procedure: RIGHT THUMB RELEASE TRIGGER FINGER/A-1 PULLEY;  Surgeon: Cindee Salt, MD;  Location: [facility];  Service: Orthopedics;  Laterality: Right;   TUBAL LIGATION     Family History  Problem Relation Age of Onset   Emphysema Mother    Heart attack Mother 45       Died age 54   Heart failure Father 55   Asthma Sister    Breast cancer Sister 25   Cancer Sister    Leukemia Brother    Asthma Maternal Uncle    Arthritis Maternal Grandmother    Cancer Maternal Grandmother    Breast cancer Maternal Grandmother    Cancer Other        aunts   Cancer Other        uncles   Colon cancer Other    Esophageal cancer Neg Hx    Stomach cancer Neg Hx    Rectal cancer Neg Hx    Social History   Socioeconomic History   Marital status: Married    Spouse name: Viviann Spare   Number of children: Not on file   Years of education: Not on file   Highest education level: Associate degree: academic program  Occupational History   Occupation: Human resources officer    Comment: [employer]   Tobacco Use   Smoking status: Former    Current packs/day: 0.00    Average packs/day: 0.5 packs/day for 40.0 years (20.0 ttl pk-yrs)    Types: Cigarettes    Start date: 08/1981    Quit date: 08/2021    Years since quitting: 1.5    Passive exposure: Current   Smokeless tobacco: Never  Vaping Use   Vaping status: Former  Substance and Sexual Activity   Alcohol use: Yes    Comment: rarely   Drug use: No   Sexual activity: Not on file  Other Topics Concern   Not on file  Social History Narrative   Lives with husband.     Social Drivers of Corporate investment banker Strain: High Risk (02/08/2023)   Overall Financial Resource Strain (CARDIA)    Difficulty of Paying Living Expenses: Hard  Food Insecurity: No Food Insecurity (02/08/2023)   Hunger Vital Sign    Worried About Running Out of Food in the Last Year: Never true    Ran Out of Food in the Last Year: Never true  Transportation Needs: No Transportation Needs (02/08/2023)   PRAPARE - Administrator, Civil Service (Medical): No    Lack of Transportation (Non-Medical): No  Physical Activity: Inactive (02/08/2023)   Exercise Vital Sign    Days of Exercise per Week: 0 days    Minutes of Exercise per Session: 0 min  Stress: No Stress Concern Present (02/08/2023)   [employer] of Occupational Health - Occupational Stress Questionnaire    Feeling of Stress : Only a little  Social Connections: Moderately Isolated (02/08/2023)   Social Connection and Isolation Panel [NHANES]    Frequency of Communication with Friends and Family: More than three times a week    Frequency of Social Gatherings with Friends and Family: Once a week    Attends Religious Services: Never    Database administrator or Organizations: No    Attends Engineer, structural: Never    Marital Status: Married    Tobacco Counseling Counseling given: Not Answered   Clinical Intake:  Pre-visit preparation completed: Yes         BMI - recorded: 24.33 Nutritional Status: BMI of 19-24  Normal Nutritional Risks: None Diabetes: No  How often do you need to have someone help you when you read instructions, pamphlets, or other written materials from your doctor or pharmacy?: 1 - Never     Information entered by :: Aristea Posada, RMA   Activities of Daily Living    02/01/2023    1:07 PM  In your present state of health, do you have any difficulty performing the following activities:  Hearing? 0  Vision? 0  Difficulty concentrating or making decisions? 0  Walking or climbing stairs? 1  Dressing or bathing? 0  Doing errands, shopping? 0  Preparing Food and eating ? N  Using the Toilet? N  In the past six months, have you accidently leaked urine? Y  Do you have problems with loss of bowel control? N  Managing your Medications? N  Managing your Finances? N  Housekeeping or managing your Housekeeping? Y    Patient Care Team: Sheliah Hatch, MD as PCP - General (Family Medicine) Rollene Rotunda, MD as PCP - Cardiology (Cardiology) Laurann Montana., MD (Obstetrics and Gynecology) Sydnee Cabal., MD as Referring Physician (Gastroenterology) Sheliah Hatch, MD as Referring Physician (Family Medicine)  Indicate any recent Medical Services you may have received from other than [facility] providers in the past year (date may be approximate).     Assessment:   This is a routine wellness examination for Carol Quinn.  Hearing/Vision screen Hearing Screening - Comments:: Denies hearing difficulties   Vision Screening - Comments:: Wears eyeglasses   Goals Addressed               This Visit's Progress     Patient Stated (pt-stated)   On track     Drink more water (on Track)/ (increase activity as tolerated, Working on it)-2025      Depression Screen    02/08/2023    2:00 PM 12/06/2022    9:03 AM 09/08/2022   10:30 AM 05/31/2022    9:03 AM 02/02/2022   12:50 PM 11/30/2021    8:50 AM 07/07/2021     9:49 AM  PHQ 2/9 Scores  PHQ - 2 Score 3 3 5  0 0 4 0  PHQ- 9 Score 3 10 11  0  12 0    Fall Risk    02/01/2023    1:07 PM 12/06/2022    9:03 AM 09/08/2022   10:30 AM 05/31/2022    9:04 AM 02/02/2022   12:56 PM  Fall Risk   Falls in the past year? 1 0 0 0 0  Number falls in past yr: 0 0 0 0 0  Injury with Fall? 1 0 0 0 0  Risk for fall due to :  No Fall Risks No Fall Risks No Fall Risks No Fall Risks  Follow up Falls evaluation completed;Falls prevention discussed Falls evaluation completed Falls evaluation completed Falls evaluation completed Falls prevention discussed    MEDICARE RISK AT HOME: Medicare Risk at Home Any stairs in or around the home?: (Patient-Rptd) Yes If so, are there any without handrails?: (Patient-Rptd) Yes Home free of loose throw rugs in walkways, pet beds, electrical cords, etc?: (Patient-Rptd) No Adequate lighting in your home to reduce risk of falls?: (Patient-Rptd) Yes Life alert?: (Patient-Rptd) No Use of a cane, walker or w/c?: (Patient-Rptd) No Grab bars in the bathroom?: (Patient-Rptd) Yes Shower chair or bench in shower?: (Patient-Rptd) No Elevated toilet seat or a handicapped toilet?: (Patient-Rptd) Yes  TIMED UP AND GO:  Was the test  performed?  No    Cognitive Function:      08/02/2013    3:00 PM  Montreal Cognitive Assessment   Visuospatial/ Executive (0/5) 5  Naming (0/3) 3  Attention: Read list of digits (0/2) 2  Attention: Read list of letters (0/1) 0  Attention: Serial 7 subtraction starting at 100 (0/3) 3  Language: Repeat phrase (0/2) 2  Language : Fluency (0/1) 1  Abstraction (0/2) 2  Delayed Recall (0/5) 4  Orientation (0/6) 6  Total 28  Adjusted Score (based on education) 28      02/08/2023    1:46 PM 02/02/2022   12:57 PM  6CIT Screen  What Year? 0 points 0 points  What month? 0 points 0 points  What time? 0 points 0 points  Count back from 20 0 points 0 points  Months in reverse 0 points 0 points  Repeat phrase 0  points 0 points  Total Score 0 points 0 points    Immunizations Immunization History  Administered Date(s) Administered   Fluad Trivalent(High Dose 65+) 10/07/2022   Influenza Split 09/10/2008, 10/10/2009, 09/11/2010, 10/21/2011, 10/25/2012, 12/24/2014, 10/15/2015   Influenza Whole 09/10/2008, 10/10/2009, 09/11/2010   Influenza, Quadrivalent, Recombinant, Inj, Pf 10/01/2018   Influenza,inj,Quad PF,6+ Mos 09/10/2013, 10/28/2016, 10/03/2017, 10/01/2018, 10/30/2019, 10/29/2020, 11/08/2021   Influenza-Unspecified 10/23/2009, 10/21/2011, 10/25/2012, 12/24/2014, 10/15/2015, 10/19/2018, 09/10/2020   Moderna Covid-19 Fall Seasonal Vaccine 27yrs & older 10/18/2022   Moderna Sars-Covid-2 Vaccination 03/28/2019, 04/24/2019, 11/20/2019, 08/06/2020   Pneumococcal Conjugate-13 01/31/2017   Pneumococcal Polysaccharide-23 01/11/2007, 09/11/2007, 10/30/2017   Tdap 12/21/2012   Zoster Recombinant(Shingrix) 10/01/2018   Zoster, Live 02/05/2019    TDAP status: Due, Education has been provided regarding the importance of this vaccine. Advised may receive this vaccine at local pharmacy or Health Dept. Aware to provide a copy of the vaccination record if obtained from local pharmacy or Health Dept. Verbalized acceptance and understanding.  Flu Vaccine status: Up to date  Pneumococcal vaccine status: Due, Education has been provided regarding the importance of this vaccine. Advised may receive this vaccine at local pharmacy or Health Dept. Aware to provide a copy of the vaccination record if obtained from local pharmacy or Health Dept. Verbalized acceptance and understanding.  Covid-19 vaccine status: Completed vaccines  Qualifies for Shingles Vaccine? Yes   Zostavax completed Yes   Shingrix Completed?: No.    Education has been provided regarding the importance of this vaccine. Patient has been advised to call insurance company to determine out of pocket expense if they have not yet received this vaccine.  Advised may also receive vaccine at local pharmacy or Health Dept. Verbalized acceptance and understanding.  Screening Tests Health Maintenance  Topic Date Due   Zoster Vaccines- Shingrix (2 of 2) 04/02/2019   Lung Cancer Screening  02/11/2022   Pneumonia Vaccine 59+ Years old (3 of 3 - PPSV23 or PCV20) 10/31/2022   COVID-19 Vaccine (6 - 2024-25 season) 12/13/2022   DTaP/Tdap/Td (2 - Td or Tdap) 12/22/2022   MAMMOGRAM  03/21/2023   Medicare Annual Wellness (AWV)  02/08/2024   Colonoscopy  03/21/2032   INFLUENZA VACCINE  Completed   DEXA SCAN  Completed   Hepatitis C Screening  Completed   HPV VACCINES  Aged Out    Health Maintenance  Health Maintenance Due  Topic Date Due   Zoster Vaccines- Shingrix (2 of 2) 04/02/2019   Lung Cancer Screening  02/11/2022   Pneumonia Vaccine 79+ Years old (3 of 3 - PPSV23 or PCV20) 10/31/2022  COVID-19 Vaccine (6 - 2024-25 season) 12/13/2022   DTaP/Tdap/Td (2 - Td or Tdap) 12/22/2022    Colorectal cancer screening: Type of screening: Colonoscopy. Completed 03/22/2022. Repeat every 5 years  Mammogram status: Completed 03/22/2022. Repeat every year  Bone Density status: Completed 12/14/2022. Results reflect: Bone density results: NORMAL. Repeat every 3-5 years.  Lung Cancer Screening: (Low Dose CT Chest recommended if Age 71-80 years, 20 pack-year currently smoking OR have quit w/in 15years.) does qualify.   Lung Cancer Screening Referral: 05/30/2022  Additional Screening:  Hepatitis C Screening: does qualify; Completed 11/23/2020  Vision Screening: Recommended annual ophthalmology exams for early detection of glaucoma and other disorders of the eye. Is the patient up to date with their annual eye exam?  Yes  Who is the provider or what is the name of the office in which the patient attends annual eye exams? Dr. Fae Pippin If pt is not established with a provider, would they like to be referred to a provider to establish care? No .    Dental Screening: Recommended annual dental exams for proper oral hygiene   Community Resource Referral / Chronic Care Management: CRR required this visit?  No   CCM required this visit?  No     Plan:     I have personally reviewed and noted the following in the patient's chart:   Medical and social history Use of alcohol, tobacco or illicit drugs  Current medications and supplements including opioid prescriptions. Patient is not currently taking opioid prescriptions. Functional ability and status Nutritional status Physical activity Advanced directives List of other physicians Hospitalizations, surgeries, and ER visits in previous 12 months Vitals Screenings to include cognitive, depression, and falls Referrals and appointments  In addition, I have reviewed and discussed with patient certain preventive protocols, quality metrics, and best practice recommendations. A written personalized care plan for preventive services as well as general preventive health recommendations were provided to patient.     Vincie Linn L Fadumo Heng, CMA   02/10/2023   After Visit Summary: (MyChart) Due to this being a telephonic visit, the after visit summary with patients personalized plan was offered to patient via MyChart   Nurse Notes: Patient is due for a Tdap and a Pneumonia and a Shingrix vaccine.  Patient had no other concerns to address today.

## 2023-02-10 NOTE — Patient Instructions (Addendum)
Carol Quinn , Thank you for taking time to come for your Medicare Wellness Visit. I appreciate your ongoing commitment to your health goals. Please review the following plan we discussed and let me know if I can assist you in the future.   Referrals/Orders/Follow-Ups/Clinician Recommendations: You are due for a Tetanus vaccine and a Pneumonia vaccine.  Aim for 30 minutes of exercise or brisk walking, 6-8 glasses of water, and 5 servings of fruits and vegetables each day.   This is a list of the screening recommended for you and due dates:  Health Maintenance  Topic Date Due   Zoster (Shingles) Vaccine (2 of 2) 04/02/2019   Screening for Lung Cancer  02/11/2022   Pneumonia Vaccine (3 of 3 - PPSV23 or PCV20) 10/31/2022   COVID-19 Vaccine (6 - 2024-25 season) 12/13/2022   DTaP/Tdap/Td vaccine (2 - Td or Tdap) 12/22/2022   Mammogram  03/21/2023   Medicare Annual Wellness Visit  02/08/2024   Colon Cancer Screening  03/21/2032   Flu Shot  Completed   DEXA scan (bone density measurement)  Completed   Hepatitis C Screening  Completed   HPV Vaccine  Aged Out    Advanced directives: (Copy Requested) Please bring a copy of your health care power of attorney and living will to the office to be added to your chart at your convenience.  Next Medicare Annual Wellness Visit scheduled for next year: No

## 2023-02-20 ENCOUNTER — Other Ambulatory Visit: Payer: Self-pay | Admitting: Family Medicine

## 2023-02-20 DIAGNOSIS — J302 Other seasonal allergic rhinitis: Secondary | ICD-10-CM

## 2023-03-22 ENCOUNTER — Other Ambulatory Visit: Payer: Self-pay | Admitting: Internal Medicine

## 2023-04-06 ENCOUNTER — Ambulatory Visit: Payer: Medicare Other | Admitting: Internal Medicine

## 2023-04-12 ENCOUNTER — Other Ambulatory Visit: Payer: Self-pay | Admitting: Internal Medicine

## 2023-04-12 DIAGNOSIS — Z720 Tobacco use: Secondary | ICD-10-CM

## 2023-04-20 ENCOUNTER — Inpatient Hospital Stay (HOSPITAL_BASED_OUTPATIENT_CLINIC_OR_DEPARTMENT_OTHER): Payer: Medicare Other | Admitting: Hematology & Oncology

## 2023-04-20 ENCOUNTER — Other Ambulatory Visit: Payer: Self-pay

## 2023-04-20 ENCOUNTER — Inpatient Hospital Stay: Payer: Medicare Other | Attending: Hematology & Oncology

## 2023-04-20 ENCOUNTER — Encounter: Payer: Self-pay | Admitting: Hematology & Oncology

## 2023-04-20 VITALS — BP 127/59 | HR 81 | Temp 97.9°F | Resp 18 | Ht 62.0 in | Wt 136.0 lb

## 2023-04-20 DIAGNOSIS — H34231 Retinal artery branch occlusion, right eye: Secondary | ICD-10-CM | POA: Diagnosis not present

## 2023-04-20 DIAGNOSIS — C6931 Malignant neoplasm of right choroid: Secondary | ICD-10-CM

## 2023-04-20 DIAGNOSIS — Z85840 Personal history of malignant neoplasm of eye: Secondary | ICD-10-CM | POA: Insufficient documentation

## 2023-04-20 DIAGNOSIS — J449 Chronic obstructive pulmonary disease, unspecified: Secondary | ICD-10-CM | POA: Insufficient documentation

## 2023-04-20 LAB — CBC WITH DIFFERENTIAL (CANCER CENTER ONLY)
Abs Immature Granulocytes: 0.02 10*3/uL (ref 0.00–0.07)
Basophils Absolute: 0.1 10*3/uL (ref 0.0–0.1)
Basophils Relative: 1 %
Eosinophils Absolute: 0.3 10*3/uL (ref 0.0–0.5)
Eosinophils Relative: 4 %
HCT: 41.8 % (ref 36.0–46.0)
Hemoglobin: 13.7 g/dL (ref 12.0–15.0)
Immature Granulocytes: 0 %
Lymphocytes Relative: 23 %
Lymphs Abs: 1.6 10*3/uL (ref 0.7–4.0)
MCH: 32.7 pg (ref 26.0–34.0)
MCHC: 32.8 g/dL (ref 30.0–36.0)
MCV: 99.8 fL (ref 80.0–100.0)
Monocytes Absolute: 0.5 10*3/uL (ref 0.1–1.0)
Monocytes Relative: 8 %
Neutro Abs: 4.2 10*3/uL (ref 1.7–7.7)
Neutrophils Relative %: 64 %
Platelet Count: 291 10*3/uL (ref 150–400)
RBC: 4.19 MIL/uL (ref 3.87–5.11)
RDW: 12 % (ref 11.5–15.5)
WBC Count: 6.7 10*3/uL (ref 4.0–10.5)
nRBC: 0 % (ref 0.0–0.2)

## 2023-04-20 LAB — CMP (CANCER CENTER ONLY)
ALT: 17 U/L (ref 0–44)
AST: 16 U/L (ref 15–41)
Albumin: 4.3 g/dL (ref 3.5–5.0)
Alkaline Phosphatase: 89 U/L (ref 38–126)
Anion gap: 7 (ref 5–15)
BUN: 22 mg/dL (ref 8–23)
CO2: 38 mmol/L — ABNORMAL HIGH (ref 22–32)
Calcium: 10 mg/dL (ref 8.9–10.3)
Chloride: 100 mmol/L (ref 98–111)
Creatinine: 0.74 mg/dL (ref 0.44–1.00)
GFR, Estimated: >60 mL/min (ref >60)
Glucose, Bld: 183 mg/dL — ABNORMAL HIGH (ref 70–99)
Potassium: 4.4 mmol/L (ref 3.5–5.1)
Sodium: 145 mmol/L (ref 135–145)
Total Bilirubin: 0.3 mg/dL (ref 0.0–1.2)
Total Protein: 6.5 g/dL (ref 6.5–8.1)

## 2023-04-20 LAB — LACTATE DEHYDROGENASE: LDH: 117 U/L (ref 98–192)

## 2023-04-20 NOTE — Progress Notes (Signed)
 Hematology and Oncology Follow Up Visit  CHAKIA COUNTS [id number] [birth year] 68 y.o. 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 Ellender is here today for follow-up.  We last saw her back in December.  Since then, she has been doing really well.  She has a 45-month-old puppy.  She is enjoying the puppy who has a lot of energy.  She has had no problems with nausea or vomiting.  She has had no exacerbations of her underlying COPD.  I think she is on inhalers and nebulizers.  She does use supplemental oxygen on occasion..  She has had no change in bowel or bladder habits.  She has had no rashes.  There is been no leg swelling.  She has had no bleeding.  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  This is working well for her right now.  Overall, I would have to say that her performance status is probably ECOG 1.    Medications:  Allergies as of 04/20/2023       Reactions   Clarithromycin Diarrhea, Rash, Other (See Comments)   Hydrocodone Bit-homatrop Mbr Other (See Comments)   paranoia   Hydrocodone Bit-homatrop Mbr Other (See Comments)   paranoia   Codeine Itching   Penicillins Other (See Comments)   Has patient had a PCN reaction causing immediate rash, facial/tongue/throat swelling, SOB or lightheadedness with hypotension: Unknown Has patient had a PCN reaction causing severe rash involving mucus membranes or skin necrosis: Unknown Has patient had a PCN reaction that required hospitalization: Unknown Has patient had a PCN reaction occurring within the last 10 years: No If all of the above answers are "NO", then may proceed with Cephalosporin use.   Topiramate Other (See Comments)   Other reaction(s): WEIGHT LOSS        Medication List        Accurate as of April 20, 2023  8:38 AM. If you have any questions, ask your nurse or doctor.          STOP taking these medications    amoxicillin-clavulanate 875-125 MG tablet Commonly known as: AUGMENTIN Stopped by: Josph Macho   predniSONE 10 MG tablet Commonly known as: DELTASONE Stopped by: Josph Macho   predniSONE 20 MG tablet Commonly known as: DELTASONE Stopped by: Josph Macho   rosuvastatin 10 MG tablet Commonly known as: CRESTOR Stopped by: Josph Macho       TAKE these medications    albuterol 108 (90 Base) MCG/ACT inhaler Commonly known as: VENTOLIN HFA Inhale 2 puffs into the lungs every 6 (six) hours as needed for wheezing or shortness of breath.   albuterol (2.5 MG/3ML) 0.083% nebulizer solution Commonly known as: PROVENTIL Take 3 mLs (2.5 mg total) by nebulization every 6 (six) hours as needed for wheezing or shortness of breath.   ALPRAZolam 0.5 MG tablet Commonly known as: XANAX Take 1 tablet (0.5 mg total) by mouth 2 (two) times daily as needed for anxiety.   aspirin EC 81 MG tablet Take 81 mg by mouth daily. Swallow whole.   aspirin-acetaminophen-caffeine 250-250-65 MG tablet Commonly known as: EXCEDRIN MIGRAINE Take 2 tablets by mouth every 6 (six) hours as needed for headache.   buPROPion 150 MG 24 hr tablet Commonly known as: WELLBUTRIN XL TAKE ONE TABLET BY MOUTH IN  THE MORNING   doxycycline 100 MG tablet Commonly known as: VIBRA-TABS Take 1 tablet (100 mg total) by mouth 2 (two) times daily.   Epinastine HCl 0.05 % ophthalmic solution Apply to eye.   escitalopram 20 MG tablet Commonly known as: LEXAPRO TAKE ONE TABLET BY MOUTH DAILY   estradiol 0.0375 MG/24HR Commonly known as: VIVELLE-DOT Place 1 patch onto the skin 2 (two) times a week.   ezetimibe 10 MG tablet Commonly known as: Zetia Take 1 tablet (10 mg total) by mouth daily.   fexofenadine 180 MG tablet Commonly known as: ALLEGRA Take 180 mg by mouth daily.   modafinil  200 MG tablet Commonly known as: PROVIGIL Take 1 tablet (200 mg total) by mouth daily.   montelukast 10 MG tablet Commonly known as: SINGULAIR TAKE ONE TABLET BY MOUTH DAILY AT BEDTIME   multivitamin with minerals tablet Take 1 tablet by mouth daily.   omeprazole 40 MG capsule Commonly known as: PRILOSEC TAKE ONE CAPSULE BY MOUTH DAILY   PreviDent 5000 Booster Plus 1.1 % Pste Generic drug: Sodium Fluoride See admin instructions.   traMADol 50 MG tablet Commonly known as: ULTRAM 1 or 2 every 6 hours if needed for cough   Trelegy Ellipta 100-62.5-25 MCG/ACT Aepb Generic drug: Fluticasone-Umeclidin-Vilant Inhale 1 puff into the lungs daily. Rinse mouth   valsartan-hydrochlorothiazide 160-25 MG tablet Commonly known as: DIOVAN-HCT Take 1 tablet by mouth daily.   varenicline 1 MG tablet Commonly known as: CHANTIX TAKE 1 TABLET BY MOUTH TWICE A DAY        Allergies:  Allergies  Allergen Reactions   Clarithromycin Diarrhea, Rash and Other (See Comments)   Hydrocodone Bit-Homatrop Mbr Other (See Comments)    paranoia   Hydrocodone Bit-Homatrop Mbr Other (See Comments)    paranoia   Codeine Itching   Penicillins Other (See Comments)     Has patient had a PCN reaction causing immediate rash, facial/tongue/throat swelling, SOB or lightheadedness with hypotension: Unknown Has patient had a PCN reaction causing severe rash involving mucus membranes or skin necrosis: Unknown Has patient had a PCN reaction that required hospitalization: Unknown Has patient had a PCN reaction occurring within the last 10 years: No If all of the above answers are "NO", then may proceed with Cephalosporin use.    Topiramate Other (See Comments)    Other reaction(s): WEIGHT LOSS     Past Medical History, Surgical history, Social history, and Family History were reviewed and updated.  Review of Systems: Review of Systems  Constitutional: Negative.   HENT: Negative.    Eyes: Negative.    Respiratory: Negative.    Cardiovascular: Negative.   Gastrointestinal:  Positive for abdominal pain.  Genitourinary: Negative.   Musculoskeletal: Negative.   Skin: Negative.   Neurological: Negative.   Endo/Heme/Allergies: Negative.   Psychiatric/Behavioral: Negative.        Physical Exam:  height is 5\' 2"  (1.575 m) and weight is 136 lb (61.7 kg). Her oral temperature is 97.9 F (36.6 C). Her blood pressure is 127/59 (abnormal) and her pulse is 81. Her respiration is 18 and oxygen saturation is 96%.   Wt Readings from Last 3 Encounters:  04/20/23 136 lb (61.7 kg)  02/08/23 133 lb (60.3 kg)  12/22/22 133 lb (60.3 kg)    Physical Exam Vitals reviewed.  HENT:     Head: Normocephalic and atraumatic.  Eyes:     Pupils: Pupils are equal, round, and reactive to light.     Comments: She has the  ocular prosthetic in the right eye.  Left eye is unremarkable.  She has good extraocular muscle movement of the left eye.  Cardiovascular:     Rate and Rhythm: Normal rate and regular rhythm.     Heart sounds: Normal heart sounds.  Pulmonary:     Effort: Pulmonary effort is normal.     Breath sounds: Normal breath sounds.  Abdominal:     General: Bowel sounds are normal.     Palpations: Abdomen is soft.  Musculoskeletal:        General: No tenderness or deformity. Normal range of motion.     Cervical back: Normal range of motion.  Lymphadenopathy:     Cervical: No cervical adenopathy.  Skin:    General: Skin is warm and dry.     Findings: No erythema or rash.  Neurological:     Mental Status: She is alert and oriented to person, place, and time.  Psychiatric:        Behavior: Behavior normal.        Thought Content: Thought content normal.        Judgment: Judgment normal.      Lab Results  Component Value Date   WBC 6.7 04/20/2023   HGB 13.7 04/20/2023   HCT 41.8 04/20/2023   MCV 99.8 04/20/2023   PLT 291 04/20/2023   No results found for: "FERRITIN", "IRON", "TIBC",  "UIBC", "IRONPCTSAT" Lab Results  Component Value Date   RBC 4.19 04/20/2023   No results found for: "KPAFRELGTCHN", "LAMBDASER", "KAPLAMBRATIO" No results found for: "IGGSERUM", "IGA", "IGMSERUM" No results found for: "TOTALPROTELP", "ALBUMINELP", "A1GS", "A2GS", "BETS", "BETA2SER", "GAMS", "MSPIKE", "SPEI"   Chemistry      Component Value Date/Time   NA 141 12/22/2022 0748   NA 143 12/23/2016 0848   K 4.0 12/22/2022 0748   K 4.3 12/23/2016 0848   CL 99 12/22/2022 0748   CL 99 12/23/2016 0848   CO2 33 (H) 12/22/2022 0748   CO2 31 12/23/2016 0848   BUN 15 12/22/2022 0748   BUN 10 12/23/2016 0848   CREATININE 0.69 12/22/2022 0748   CREATININE 1.0 12/23/2016 0848      Component Value Date/Time   CALCIUM 9.4 12/22/2022 0748   CALCIUM 9.4 12/23/2016 0848   ALKPHOS 84 12/22/2022 0748   ALKPHOS 99 (H) 12/23/2016 0848   AST 15 12/22/2022 0748   ALT 17 12/22/2022 0748   ALT 26 12/23/2016 0848   BILITOT 0.3 12/22/2022 0748      Impression and Plan: Ms. Shimp is a very pleasant 68 yo caucasian female with a choroidal melanoma of the right eye with right eye enucleation in October 2017.   So far, things look fantastic.  I do not see any evidence of metastatic disease.  We will continue to follow her along.  I think we will probably get her back in 6 months now.  We will try to get her through [facility].  I am sure she will be kept busy.  I know she is going go down to [place] to see a sister.  This will be in June.  I am happy that everything is going well for her.  I still do not see that we have to do any scans on her.   Josph Macho, MD 4/10/20258:38 AM

## 2023-05-03 NOTE — Progress Notes (Signed)
 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  No acute concerns.  She now has a dog.  Discussed fall vaccination plans.  Needs refill of albuterol  nebulizer solution.  Not much cough currently. CXR 06/02/22- IMPRESSION: Marked severity emphysema without acute cardiopulmonary disease.  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 Disch w prednisone . -----SOB with exertion.  Needs to wear oxygen with extended talking or any  walking. Discussed the use of AI scribe software for clinical note transcription with the patient, who gave verbal consent to proceed.  History of Present Illness   The patient, with a history of respiratory infections, presents after a recent hospital admission for a respiratory infection. She reports dissatisfaction with the hospital care in [facility], citing issues with mobility due to the length of the oxygen tubing and disagreement with the diagnosis of a coronavirus infection. She believes she had a bacterial infection, as she was 'blowing out lime green mucus.' After discharge, she was prescribed an antibiotic by this office, which resolved her symptoms.  The patient has quit smoking and is currently using Chantix , but still experiences daily cravings, particularly on stressful days. She is trying to adopt a non-smoker identity, but finds it challenging, especially as her husband still smokes.  The patient also has a history of cancer, but reports that her oncologist is happy with her progress. She is also under the care of three different doctors for her eyes. She is currently taking modafinil  to help with sleep issues and has a prescription for tramadol , which she uses occasionally for coughing during respiratory infections.     Assessment and Plan:    COPD mixed type Use of tramadol  for cough Intermittent tramadol  use for cough due to respiratory infections. Effective antitussive for her, intolerant to codeine.  Respiratory infection Initial coronavirus diagnosis in January, improved with antibiotics. No current symptoms.  Nicotine  dependence, in remission Remission maintained with Chantix . Reports cravings, uses gum. Husband's smoking may influence cravings.  Use of modafinil  Continued modafinil  for wakefulness. Aware of prescription status. - Send prescription for modafinil  to [facility].     ROS-see HPI   + = positive Constitutional:   No-   weight loss, night sweats,  fevers, chills, +fatigue, lassitude. HEENT:   No-  headaches, difficulty swallowing, tooth/dental problems, sore throat,       No-  sneezing,  itching, ear ache, +nasal congestion, +post nasal drip,  CV:  No-   chest pain, orthopnea, PND, swelling in lower extremities, anasarca, dizziness, palpitations Resp: + shortness of breath with exertion or at rest.                productive cough,  + non-productive cough,  No- coughing up of blood.              +change in color of mucus.  + wheezing.   Skin: No-   rash or lesions. GI:  No-   heartburn, indigestion, abdominal pain, nausea, vomiting,  GU:  MS:  No-   joint pain or swelling.   Neuro-     nothing unusual Psych:  No- change in mood or affect. No depression or anxiety.  No memory loss.  OBJ- Physical Exam     General- Alert, Oriented, Affect-appropriate,      POC 2L Skin- clear  Lymphadenopathy- none Head- atraumatic            Eyes- + R prosthesis            Ears- Hearing, canals-normal            Nose- clear, no-Septal dev, mucus, polyps, erosion, perforation             Throat- Mallampati IV , mucosa clear/  drainage- none, tonsils- small residual, + occasional throat clearing Neck- flexible , trachea midline, no stridor , thyroid  nl, carotid no bruit Chest - symmetrical excursion , unlabored           Heart/CV- RRR , no murmur , no gallop  , no rub, nl s1 s2                           - JVD- none , edema- none, stasis changes- none, varices- none           Lung-  +distant/ clear, Wheeze- none, cough-none , dullness-none, rub- none,             Chest wall-  Abd-  Br/ Gen/ Rectal- Not done, not indicated Extrem- cyanosis- none, clubbing, none, atrophy- none, strength- nl Neuro- grossly intact to observation

## 2023-05-04 ENCOUNTER — Ambulatory Visit (INDEPENDENT_AMBULATORY_CARE_PROVIDER_SITE_OTHER): Admitting: Internal Medicine

## 2023-05-04 ENCOUNTER — Encounter: Payer: Self-pay | Admitting: Internal Medicine

## 2023-05-04 VITALS — BP 120/58 | HR 83 | Temp 97.7°F | Ht 62.0 in | Wt 140.0 lb

## 2023-05-04 DIAGNOSIS — Z8616 Personal history of COVID-19: Secondary | ICD-10-CM | POA: Diagnosis not present

## 2023-05-04 DIAGNOSIS — J449 Chronic obstructive pulmonary disease, unspecified: Secondary | ICD-10-CM | POA: Diagnosis not present

## 2023-05-04 DIAGNOSIS — Z87891 Personal history of nicotine dependence: Secondary | ICD-10-CM

## 2023-05-04 MED ORDER — MODAFINIL 200 MG PO TABS: 200.0000 mg | ORAL_TABLET | Freq: Every day | ORAL | 5 refills | Status: DC

## 2023-05-04 MED ORDER — TRAMADOL HCL 50 MG PO TABS: ORAL_TABLET | ORAL | 5 refills | Status: AC

## 2023-05-04 NOTE — Patient Instructions (Addendum)
 Tramadol  for cough refilled  Modafinil  for alertness refilled

## 2023-05-29 ENCOUNTER — Other Ambulatory Visit: Payer: Self-pay | Admitting: Family Medicine

## 2023-05-29 ENCOUNTER — Other Ambulatory Visit: Payer: Self-pay | Admitting: Internal Medicine

## 2023-06-07 ENCOUNTER — Other Ambulatory Visit (HOSPITAL_COMMUNITY): Payer: Self-pay

## 2023-06-07 ENCOUNTER — Ambulatory Visit (INDEPENDENT_AMBULATORY_CARE_PROVIDER_SITE_OTHER): Payer: Medicare Other | Admitting: Family Medicine

## 2023-06-07 ENCOUNTER — Telehealth: Payer: Self-pay

## 2023-06-07 ENCOUNTER — Encounter: Payer: Self-pay | Admitting: Family Medicine

## 2023-06-07 VITALS — BP 126/62 | HR 80 | Temp 98.0°F | Ht 62.0 in | Wt 140.4 lb

## 2023-06-07 DIAGNOSIS — I10 Essential (primary) hypertension: Secondary | ICD-10-CM

## 2023-06-07 DIAGNOSIS — Z23 Encounter for immunization: Secondary | ICD-10-CM

## 2023-06-07 DIAGNOSIS — E785 Hyperlipidemia, unspecified: Secondary | ICD-10-CM

## 2023-06-07 LAB — BASIC METABOLIC PANEL WITH GFR
BUN: 18 mg/dL (ref 6–23)
CO2: 36 meq/L — ABNORMAL HIGH (ref 19–32)
Calcium: 9.5 mg/dL (ref 8.4–10.5)
Chloride: 97 meq/L (ref 96–112)
Creatinine, Ser: 0.55 mg/dL (ref 0.40–1.20)
GFR: 95.51 mL/min (ref >60.00)
Glucose, Bld: 94 mg/dL (ref 70–99)
Potassium: 3.8 meq/L (ref 3.5–5.1)
Sodium: 139 meq/L (ref 135–145)

## 2023-06-07 LAB — LIPID PANEL
Cholesterol: 209 mg/dL — ABNORMAL HIGH (ref 0–200)
HDL: 68 mg/dL (ref >39.00)
LDL Cholesterol: 115 mg/dL — ABNORMAL HIGH (ref 0–99)
NonHDL: 140.96
Total CHOL/HDL Ratio: 3
Triglycerides: 129 mg/dL (ref 0.0–149.0)
VLDL: 25.8 mg/dL (ref 0.0–40.0)

## 2023-06-07 LAB — HEPATIC FUNCTION PANEL
ALT: 16 U/L (ref 0–35)
AST: 17 U/L (ref 0–37)
Albumin: 4.1 g/dL (ref 3.5–5.2)
Alkaline Phosphatase: 92 U/L (ref 39–117)
Bilirubin, Direct: 0.1 mg/dL (ref 0.0–0.3)
Total Bilirubin: 0.3 mg/dL (ref 0.2–1.2)
Total Protein: 6.6 g/dL (ref 6.0–8.3)

## 2023-06-07 LAB — CBC WITH DIFFERENTIAL/PLATELET
Basophils Absolute: 0.2 10*3/uL — ABNORMAL HIGH (ref 0.0–0.1)
Basophils Relative: 2.1 % (ref 0.0–3.0)
Eosinophils Absolute: 0.7 10*3/uL (ref 0.0–0.7)
Eosinophils Relative: 8.7 % — ABNORMAL HIGH (ref 0.0–5.0)
HCT: 38 % (ref 36.0–46.0)
Hemoglobin: 12.7 g/dL (ref 12.0–15.0)
Lymphocytes Relative: 18.5 % (ref 12.0–46.0)
Lymphs Abs: 1.4 10*3/uL (ref 0.7–4.0)
MCHC: 33.5 g/dL (ref 30.0–36.0)
MCV: 95.1 fl (ref 78.0–100.0)
Monocytes Absolute: 0.8 10*3/uL (ref 0.1–1.0)
Monocytes Relative: 11 % (ref 3.0–12.0)
Neutro Abs: 4.5 10*3/uL (ref 1.4–7.7)
Neutrophils Relative %: 59.7 % (ref 43.0–77.0)
Platelets: 353 10*3/uL (ref 150.0–400.0)
RBC: 3.99 Mil/uL (ref 3.87–5.11)
RDW: 12.3 % (ref 11.5–15.5)
WBC: 7.5 10*3/uL (ref 4.0–10.5)

## 2023-06-07 LAB — TSH: TSH: 3.97 u[IU]/mL (ref 0.35–5.50)

## 2023-06-07 MED ORDER — REPATHA SURECLICK 140 MG/ML ~~LOC~~ SOAJ: 140.0000 mg | SUBCUTANEOUS | 2 refills | Status: DC

## 2023-06-07 MED ORDER — VALSARTAN-HYDROCHLOROTHIAZIDE 160-25 MG PO TABS: 1.0000 | ORAL_TABLET | Freq: Every day | ORAL | 1 refills | Status: AC

## 2023-06-07 NOTE — Telephone Encounter (Signed)
 Pharmacy Patient Advocate Encounter  Received notification from [employer] that Prior Authorization for Repatha  Sure[employer] has been APPROVED from 06/07/23 to 12/08/23. Ran test claim, Copay is $35.00. This test claim was processed through [facility]- copay amounts may vary at other pharmacies due to pharmacy/plan contracts, or as the patient moves through the different stages of their insurance plan.   PA #/Case ID/Reference #: [id number]

## 2023-06-07 NOTE — Telephone Encounter (Signed)
 Pt has been informed and will go pick up today

## 2023-06-07 NOTE — Progress Notes (Signed)
   Subjective:    Patient ID: Carol Quinn, female    DOB: [birth year], 68 y.o.   MRN: [id number]  HPI Hyperlipidemia- chronic problem.  Was on Zetia  10mg  daily but stopped this due to leg pain.  Was intolerant to statins in the past.  Pt reports pain has improved since stopping medication last week.  'it got to the point I could barely walk'  Denies abd pain, N/V.  HTN- chronic problem, on Valsartan  hydrochlorothiazide  160/25mg  daily w/ excellent control.  No CP, SOB above baseline, HA's, visual changes, edema.   Review of Systems For ROS see HPI     Objective:   Physical Exam Vitals reviewed.  Constitutional:      General: She is not in acute distress.    Appearance: Normal appearance. She is well-developed. She is not ill-appearing.  HENT:     Head: Normocephalic and atraumatic.  Eyes:     Conjunctiva/sclera: Conjunctivae normal.     Pupils: Pupils are equal, round, and reactive to light.  Neck:     Thyroid : No thyromegaly.  Cardiovascular:     Rate and Rhythm: Normal rate and regular rhythm.     Pulses: Normal pulses.     Heart sounds: Normal heart sounds. No murmur heard. Pulmonary:     Effort: Pulmonary effort is normal. No respiratory distress.     Breath sounds: Normal breath sounds.  Abdominal:     General: There is no distension.     Palpations: Abdomen is soft.     Tenderness: There is no abdominal tenderness.  Musculoskeletal:     Cervical back: Normal range of motion and neck supple.     Right lower leg: No edema.     Left lower leg: No edema.  Lymphadenopathy:     Cervical: No cervical adenopathy.  Skin:    General: Skin is warm and dry.  Neurological:     General: No focal deficit present.     Mental Status: She is alert and oriented to person, place, and time.  Psychiatric:        Mood and Affect: Mood normal.        Behavior: Behavior normal.        Thought Content: Thought content normal.           Assessment & Plan:

## 2023-06-07 NOTE — Assessment & Plan Note (Signed)
 Deteriorated.  Pt found that she was not able to tolerate Zetia  like she was not able to tolerate statins.  She reports that initially she did ok on the medication but over time, she developed worsening pain- particularly in her legs.  This pain resolved when she stopped the medication.  Discussed that there was another option if she was open to it- Repatha or Praluent.  She is not excited about giving herself an injection but is willing to try.  Will follow closely.

## 2023-06-07 NOTE — Telephone Encounter (Signed)
 Pharmacy Patient Advocate Encounter   Received notification from [employer] that prior authorization for Repatha Sureclick is required/requested.   Insurance verification completed.   The patient is insured through [facility] .   Per test claim: PA required; PA submitted to above mentioned insurance via CoverMyMeds Key/confirmation #/EOC [id number] Status is pending

## 2023-06-07 NOTE — Assessment & Plan Note (Signed)
 Chronic problem.  Well controlled on Valsartan  hydrochlorothiazide  160/25mg  daily.  Currently asymptomatic w/ exception of baseline SOB.  Check labs due to ARB and diuretic use but no anticipated med changes.

## 2023-06-07 NOTE — Patient Instructions (Signed)
 Schedule your complete physical in 6 months We'll notify you of your lab results and make any changes if needed Keep up the good work on healthy diet and regular exercise- you look great! STOP the Zetia  START the Repatha  once we get it approved Get your Tdap (tetanus) and shingles shot at the pharmacy Call with any questions or concerns Stay Safe!  Stay Healthy! Have a great summer!!!

## 2023-06-08 ENCOUNTER — Ambulatory Visit: Payer: Self-pay | Admitting: Family Medicine

## 2023-06-08 NOTE — Telephone Encounter (Signed)
 Patient noticed cholesterol was in normal range and is now questioning if she should stay on cholesterol medication or if she can come off the medication and try to control with diet and exercise?

## 2023-06-27 ENCOUNTER — Other Ambulatory Visit: Payer: Self-pay | Admitting: Family Medicine

## 2023-06-27 DIAGNOSIS — J302 Other seasonal allergic rhinitis: Secondary | ICD-10-CM

## 2023-07-09 ENCOUNTER — Encounter: Payer: Self-pay | Admitting: Family Medicine

## 2023-07-10 NOTE — Telephone Encounter (Signed)
 Patient sending in message notes severe pain in legs where it had been very manageable when starting Repatha , notes intense pain over the weekend and asking if she needs to come see you again, see a Orthopedic, cardiologist or other.   Please advise

## 2023-07-31 ENCOUNTER — Encounter: Payer: Self-pay | Admitting: Hematology & Oncology

## 2023-08-15 ENCOUNTER — Encounter: Payer: Self-pay | Admitting: Family Medicine

## 2023-08-15 NOTE — Telephone Encounter (Signed)
 Patient is asking if she needs to be on another cholesterol medication. Since d/c Repatha , leg pain has diminished.

## 2023-08-28 NOTE — Telephone Encounter (Signed)
 Please see patients message about wanting to stay on CHL medications. Please advise, thank you

## 2023-08-29 MED ORDER — SIMVASTATIN 20 MG PO TABS: 20.0000 mg | ORAL_TABLET | Freq: Every day | ORAL | 3 refills | Status: DC

## 2023-08-29 NOTE — Addendum Note (Signed)
 Addended by: Udell Blasingame E on: 08/29/2023 07:44 AM   Modules accepted: Orders

## 2023-09-22 ENCOUNTER — Other Ambulatory Visit: Payer: Self-pay | Admitting: Internal Medicine

## 2023-10-07 ENCOUNTER — Other Ambulatory Visit: Payer: Self-pay | Admitting: Internal Medicine

## 2023-10-07 DIAGNOSIS — Z720 Tobacco use: Secondary | ICD-10-CM

## 2023-10-08 ENCOUNTER — Other Ambulatory Visit: Payer: Self-pay | Admitting: Family Medicine

## 2023-10-08 DIAGNOSIS — F32A Depression, unspecified: Secondary | ICD-10-CM

## 2023-10-08 NOTE — Telephone Encounter (Signed)
 Received pharmacy request for Chantix  1mg  refill.  Last refill 04/12/23 Last OV 05/04/23  Message routed to Dr. Neysa to advise

## 2023-10-09 NOTE — Telephone Encounter (Signed)
Chantix refilled

## 2023-10-10 ENCOUNTER — Other Ambulatory Visit (HOSPITAL_BASED_OUTPATIENT_CLINIC_OR_DEPARTMENT_OTHER): Payer: Self-pay | Admitting: Obstetrics and Gynecology

## 2023-10-10 DIAGNOSIS — Z1231 Encounter for screening mammogram for malignant neoplasm of breast: Secondary | ICD-10-CM

## 2023-10-12 ENCOUNTER — Encounter (HOSPITAL_BASED_OUTPATIENT_CLINIC_OR_DEPARTMENT_OTHER): Payer: Self-pay

## 2023-10-12 ENCOUNTER — Ambulatory Visit (HOSPITAL_BASED_OUTPATIENT_CLINIC_OR_DEPARTMENT_OTHER)
Admission: RE | Admit: 2023-10-12 | Discharge: 2023-10-12 | Disposition: A | Discharge status: Home / Self Care | Source: Ambulatory Visit | Attending: Obstetrics and Gynecology | Admitting: Obstetrics and Gynecology

## 2023-10-12 DIAGNOSIS — Z1231 Encounter for screening mammogram for malignant neoplasm of breast: Secondary | ICD-10-CM | POA: Diagnosis present

## 2023-10-20 ENCOUNTER — Inpatient Hospital Stay: Admitting: Hematology & Oncology

## 2023-10-20 ENCOUNTER — Encounter: Payer: Self-pay | Admitting: Hematology & Oncology

## 2023-10-20 ENCOUNTER — Inpatient Hospital Stay: Attending: Hematology & Oncology

## 2023-10-20 VITALS — BP 134/59 | HR 81 | Temp 97.7°F | Resp 24 | Ht 62.0 in | Wt 143.1 lb

## 2023-10-20 DIAGNOSIS — Z85840 Personal history of malignant neoplasm of eye: Secondary | ICD-10-CM | POA: Diagnosis present

## 2023-10-20 DIAGNOSIS — C6931 Malignant neoplasm of right choroid: Secondary | ICD-10-CM | POA: Diagnosis not present

## 2023-10-20 LAB — CBC WITH DIFFERENTIAL (CANCER CENTER ONLY)
Abs Immature Granulocytes: 0.02 K/uL (ref 0.00–0.07)
Basophils Absolute: 0.1 K/uL (ref 0.0–0.1)
Basophils Relative: 2 %
Eosinophils Absolute: 0.4 K/uL (ref 0.0–0.5)
Eosinophils Relative: 5 %
HCT: 40.1 % (ref 36.0–46.0)
Hemoglobin: 13.4 g/dL (ref 12.0–15.0)
Immature Granulocytes: 0 %
Lymphocytes Relative: 25 %
Lymphs Abs: 2 K/uL (ref 0.7–4.0)
MCH: 32.2 pg (ref 26.0–34.0)
MCHC: 33.4 g/dL (ref 30.0–36.0)
MCV: 96.4 fL (ref 80.0–100.0)
Monocytes Absolute: 0.9 K/uL (ref 0.1–1.0)
Monocytes Relative: 11 %
Neutro Abs: 4.5 K/uL (ref 1.7–7.7)
Neutrophils Relative %: 57 %
Platelet Count: 295 K/uL (ref 150–400)
RBC: 4.16 MIL/uL (ref 3.87–5.11)
RDW: 11.9 % (ref 11.5–15.5)
WBC Count: 7.9 K/uL (ref 4.0–10.5)
nRBC: 0 % (ref 0.0–0.2)

## 2023-10-20 LAB — CMP (CANCER CENTER ONLY)
ALT: 16 U/L (ref 0–44)
AST: 22 U/L (ref 15–41)
Albumin: 4.4 g/dL (ref 3.5–5.0)
Alkaline Phosphatase: 94 U/L (ref 38–126)
Anion gap: 11 (ref 5–15)
BUN: 14 mg/dL (ref 8–23)
CO2: 31 mmol/L (ref 22–32)
Calcium: 9.6 mg/dL (ref 8.9–10.3)
Chloride: 100 mmol/L (ref 98–111)
Creatinine: 0.6 mg/dL (ref 0.44–1.00)
GFR, Estimated: >60 mL/min (ref >60)
Glucose, Bld: 131 mg/dL — ABNORMAL HIGH (ref 70–99)
Potassium: 4.1 mmol/L (ref 3.5–5.1)
Sodium: 142 mmol/L (ref 135–145)
Total Bilirubin: 0.4 mg/dL (ref 0.0–1.2)
Total Protein: 6.8 g/dL (ref 6.5–8.1)

## 2023-10-20 LAB — LACTATE DEHYDROGENASE: LDH: 142 U/L (ref 98–192)

## 2023-10-20 NOTE — Progress Notes (Signed)
 Hematology and Oncology Follow Up Visit  Carol Quinn [id number] 1955-07-27 68 y.o. 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  We last saw her 6 months ago.  She is doing okay.  She however, she is under a lot of stress with her family.  Apparently, a granddaughter has lymphoma.  She has not had any treatment yet.  I am not sure exactly what the delay has been from what Carol Quinn says.  Otherwise, she is doing okay.  She has had no cough or shortness of breath.  She did have a bad summer with her asthma.  She is on her inhalers and nebulizers.  She has had no change in bowel or bladder habits.  She has had no rashes.  There is been no leg swelling.  She needs to have another CT scan.  We will see about get this set up for her in a couple weeks.  Overall, I will say that her performance status is probably ECOG 1.    Medications:  Allergies as of 10/20/2023       Reactions   Clarithromycin Diarrhea, Rash, Other (See Comments)   Hydrocodone  Bit-homatrop Mbr Other (See Comments)   paranoia   Hydrocodone  Bit-homatrop Mbr Other (See Comments)   paranoia   Codeine Itching   Penicillins Other (See Comments)   Has patient had a PCN reaction causing immediate rash, facial/tongue/throat swelling, SOB or lightheadedness with hypotension: Unknown Has patient had a PCN reaction causing severe rash involving mucus membranes or skin necrosis: Unknown Has patient had a PCN reaction that required hospitalization: Unknown Has patient had a PCN reaction occurring within the last 10 years: No If all of the above answers are NO, then may proceed with Cephalosporin use.   Topiramate Other (See Comments)   Other reaction(s): WEIGHT LOSS         Medication List        Accurate as of October 20, 2023  9:23 AM. If you have any questions, ask your nurse or doctor.          STOP taking these medications    Repatha  SureClick 140 MG/ML Soaj Generic drug: Evolocumab  Stopped by: Carol Quinn   simvastatin  20 MG tablet Commonly known as: ZOCOR  Stopped by: Carol Quinn       TAKE these medications    albuterol  108 (90 Base) MCG/ACT inhaler Commonly known as: VENTOLIN  HFA Inhale 2 puffs into the lungs every 6 (six) hours as needed for wheezing or shortness of breath.   albuterol  (2.5 MG/3ML) 0.083% nebulizer solution Commonly known as: PROVENTIL  Take 3 mLs (2.5 mg total) by nebulization every 6 (six) hours as needed for wheezing or shortness of breath.   ALPRAZolam  0.5 MG tablet Commonly known as: XANAX  Take 1 tablet (0.5 mg total) by mouth 2 (two) times daily as needed for anxiety.   aspirin EC 81 MG tablet Take 81 mg by mouth daily. Swallow whole.   aspirin-acetaminophen -caffeine 250-250-65 MG tablet Commonly known as: EXCEDRIN MIGRAINE Take 2 tablets by mouth every 6 (six) hours as needed for headache.   buPROPion  150 MG 24 hr tablet Commonly known as: WELLBUTRIN  XL TAKE ONE TABLET BY MOUTH IN THE MORNING   Epinastine HCl 0.05 % ophthalmic solution Apply to eye.  escitalopram  20 MG tablet Commonly known as: LEXAPRO  TAKE ONE TABLET BY MOUTH DAILY   estradiol 0.0375 MG/24HR Commonly known as: VIVELLE-DOT Place 1 patch onto the skin 2 (two) times a week. What changed: additional instructions   fexofenadine  180 MG tablet Commonly known as: ALLEGRA  Take 180 mg by mouth daily.   modafinil  200 MG tablet Commonly known as: PROVIGIL  Take 1 tablet (200 mg total) by mouth daily.   montelukast  10 MG tablet Commonly known as: SINGULAIR  TAKE ONE TABLET BY MOUTH DAILY AT BEDTIME   multivitamin with minerals tablet Take 1 tablet by mouth daily.   omeprazole  40 MG capsule Commonly known as:  PRILOSEC TAKE ONE CAPSULE BY MOUTH DAILY   PreviDent 5000 Booster Plus 1.1 % Pste Generic drug: Sodium Fluoride See admin instructions.   traMADol  50 MG tablet Commonly known as: ULTRAM  1 or 2 every 6 hours if needed for cough   Trelegy Ellipta  100-62.5-25 MCG/ACT Aepb Generic drug: Fluticasone -Umeclidin-Vilant Inhale 1 puff into the lungs daily. Rinse mouth   valsartan -hydrochlorothiazide  160-25 MG tablet Commonly known as: DIOVAN -HCT Take 1 tablet by mouth daily.   varenicline  1 MG tablet Commonly known as: CHANTIX  TAKE 1 TABLET BY MOUTH TWICE A DAY        Allergies:  Allergies  Allergen Reactions   Clarithromycin Diarrhea, Rash and Other (See Comments)   Hydrocodone  Bit-Homatrop Mbr Other (See Comments)    paranoia   Hydrocodone  Bit-Homatrop Mbr Other (See Comments)    paranoia   Codeine Itching   Penicillins Other (See Comments)     Has patient had a PCN reaction causing immediate rash, facial/tongue/throat swelling, SOB or lightheadedness with hypotension: Unknown Has patient had a PCN reaction causing severe rash involving mucus membranes or skin necrosis: Unknown Has patient had a PCN reaction that required hospitalization: Unknown Has patient had a PCN reaction occurring within the last 10 years: No If all of the above answers are NO, then may proceed with Cephalosporin use.    Topiramate Other (See Comments)    Other reaction(s): WEIGHT LOSS     Past Medical History, Surgical history, Social history, and Family History were reviewed and updated.  Review of Systems: Review of Systems  Constitutional: Negative.   HENT: Negative.    Eyes: Negative.   Respiratory: Negative.    Cardiovascular: Negative.   Gastrointestinal:  Positive for abdominal pain.  Genitourinary: Negative.   Musculoskeletal: Negative.   Skin: Negative.   Neurological: Negative.   Endo/Heme/Allergies: Negative.   Psychiatric/Behavioral: Negative.        Physical Exam:   height is 5' 2 (1.575 m) and weight is 143 lb 1.9 oz (64.9 kg). Her oral temperature is 97.7 F (36.5 C). Her blood pressure is 134/59 (abnormal) and her pulse is 81. Her respiration is 24 (abnormal) and oxygen saturation is 93%.   Wt Readings from Last 3 Encounters:  10/20/23 143 lb 1.9 oz (64.9 kg)  06/07/23 140 lb 6.4 oz (63.7 kg)  05/04/23 140 lb (63.5 kg)    Physical Exam Vitals reviewed.  HENT:     Head: Normocephalic and atraumatic.  Eyes:     Pupils: Pupils are equal, round, and reactive to light.     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  Left eye is unremarkable.  She has good extraocular muscle movement of the left eye.  Cardiovascular:     Rate and Rhythm: Normal rate and regular rhythm.     Heart sounds: Normal heart sounds.  Pulmonary:     Effort: Pulmonary effort is normal.     Breath sounds: Normal breath sounds.  Abdominal:     General: Bowel sounds are normal.     Palpations: Abdomen is soft.  Musculoskeletal:        General: No tenderness or deformity. Normal range of motion.     Cervical back: Normal range of motion.  Lymphadenopathy:     Cervical: No cervical adenopathy.  Skin:    General: Skin is warm and dry.     Findings: No erythema or rash.  Neurological:     Mental Status: She is alert and oriented to person, place, and time.  Psychiatric:        Behavior: Behavior normal.        Thought Content: Thought content normal.        Judgment: Judgment normal.      Lab Results  Component Value Date   WBC 7.9 10/20/2023   HGB 13.4 10/20/2023   HCT 40.1 10/20/2023   MCV 96.4 10/20/2023   PLT 295 10/20/2023   No results found for: FERRITIN, IRON, TIBC, UIBC, IRONPCTSAT Lab Results  Component Value Date   RBC 4.16 10/20/2023   No results found for: KPAFRELGTCHN, LAMBDASER, KAPLAMBRATIO No results found for: IGGSERUM, IGA, IGMSERUM No results found for: STEPHANY CARLOTA BENSON MARKEL EARLA JOANNIE DOC VICK, SPEI   Chemistry      Component Value Date/Time   NA 142 10/20/2023 0805   NA 143 12/23/2016 0848   K 4.1 10/20/2023 0805   K 4.3 12/23/2016 0848   CL 100 10/20/2023 0805   CL 99 12/23/2016 0848   CO2 31 10/20/2023 0805   CO2 31 12/23/2016 0848   BUN 14 10/20/2023 0805   BUN 10 12/23/2016 0848   CREATININE 0.60 10/20/2023 0805   CREATININE 1.0 12/23/2016 0848      Component Value Date/Time   CALCIUM  9.6 10/20/2023 0805   CALCIUM  9.4 12/23/2016 0848   ALKPHOS 94 10/20/2023 0805   ALKPHOS 99 (H) 12/23/2016 0848   AST 22 10/20/2023 0805   ALT 16 10/20/2023 0805   ALT 26 12/23/2016 0848   BILITOT 0.4 10/20/2023 0805      Impression and Plan: Ms. Brogden is a very pleasant 68 yo caucasian female with a choroidal melanoma of the right eye with right eye enucleation in October 2017.   So far, things look fantastic.  I do not see any evidence of metastatic disease.  We will see about doing another scan on her.  Again I will set this up in 2 weeks.  We will plan to get her back in 6 months.  Hopefully, by then, issues with her family will have improved.  I know that she is doing her best to help her family.  She has a very strong constitution.   Carol Carol Crease, MD 10/10/20259:23 AM

## 2023-10-31 ENCOUNTER — Other Ambulatory Visit: Payer: Self-pay | Admitting: Internal Medicine

## 2023-11-03 ENCOUNTER — Ambulatory Visit: Admitting: Internal Medicine

## 2023-11-06 NOTE — Progress Notes (Signed)
 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 Disch w prednisone . -----SOB with exertion.  Needs to wear oxygen with extended talking or any walking. Discussed the use of AI scribe software for clinical note transcription with the patient, who gave verbal consent to proceed.  History of Present Illness   The patient, with a history of respiratory infections, presents after a recent hospital admission for a respiratory infection. She reports dissatisfaction with the hospital care in [facility], citing issues with mobility due to the length of the oxygen tubing and disagreement with the diagnosis of a coronavirus infection. She believes she had a bacterial infection, as she was 'blowing out lime green mucus.' After discharge, she was prescribed an antibiotic by this office, which resolved her symptoms.  The patient has quit smoking and is currently using  Chantix , but still experiences daily cravings, particularly on stressful days. She is trying to adopt a non-smoker identity, but finds it challenging, especially as her husband still smokes.  The patient also has a history of cancer, but reports that her oncologist is happy with her progress. She is also under the care of three different doctors for her eyes. She is currently taking modafinil  to help with sleep issues and has a prescription for tramadol , which she uses occasionally for coughing during respiratory infections.     Assessment and Plan:    COPD mixed type Use of tramadol  for cough Intermittent tramadol  use for cough due to respiratory infections. Effective antitussive for her, intolerant to codeine.  Respiratory infection Initial coronavirus diagnosis in January, improved with antibiotics. No current symptoms.  Nicotine  dependence, in remission Remission maintained with Chantix . Reports cravings, uses gum. Husband's smoking may influence cravings.  Use of modafinil  Continued modafinil  for wakefulness. Aware of prescription status. - Send prescription for modafinil  to [facility].    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  History of Present Illness   LYDIANA MILLEY is a 68 year old female with chronic respiratory issues who presents with concerns about her oxygen concentrator and medication refills.  She uses home oxygen almost  constantly at two liters, occasionally increasing to three liters. Her portable oxygen concentrator, obtained from a home care company, has malfunctioned again and typically requires repairs  after about a year of use. She comes on room air today  Her respiratory management includes a nebulizer solution, albuterol , and a rescue inhaler, Ventolin . She also takes Trelegy and montelukast  (Singulair ). She quit smoking at the end of last December. No current coughing is present.   No recurrence of her occular melanoma, followed by Oncology     Assessment and Plan:    Acute skin infection at site of Podiatric surgery L great toenail Current antibiotic may be insufficient as infection site is red and discolored. - Continue doxycycline  as prescribed. - Consult with her Podiatrist regarding the need for extending antibiotic treatment.  Chronic obstructive pulmonary disease (COPD) with chronic hypoxemia requiring home oxygen COPD with chronic hypoxemia, on home oxygen therapy. Portable concentrator malfunctioning. Not smoking since last December. - Contacted home care company to repair or replace the portable oxygen concentrator. - Refill prescriptions for nebulizer solution (albuterol ), rescue inhaler (Ventolin ), Trelegy, Singulair  (montelukast ), and modafinil . - Administer flu shot.     CXR 05/30/22 IMPRESSION: Marked severity emphysema without acute cardiopulmonary disease.    ROS-see HPI   + = positive Constitutional:   No-   weight loss, night sweats, fevers, chills, +fatigue, lassitude. HEENT:   No-  headaches, difficulty swallowing, tooth/dental problems, sore throat,       No-  sneezing, itching, ear ache, +nasal congestion, +post nasal drip,  CV:  No-   chest pain, orthopnea, PND, swelling in lower extremities, anasarca, dizziness, palpitations Resp: + shortness of breath with exertion or at rest.                productive cough,  + non-productive cough,  No- coughing up of blood.               +change in color of mucus.  + wheezing.   Skin: No-   rash or lesions. GI:  No-   heartburn, indigestion, abdominal pain, nausea, vomiting,  GU:  MS:  No-   joint pain or swelling.   Neuro-     nothing unusual Psych:  No- change in mood or affect. No depression or anxiety.  No memory loss.  OBJ- Physical Exam     General- Alert, Oriented, Affect-appropriate,     room air today Skin- clear  Lymphadenopathy- none Head- atraumatic            Eyes- + R prosthesis            Ears- Hearing, canals-normal            Nose- clear, no-Septal dev, mucus, polyps, erosion, perforation             Throat- Mallampati IV , mucosa clear/  drainage- none, tonsils- small residual,  Neck- flexible , trachea midline, no stridor , thyroid  nl, carotid no bruit Chest - symmetrical excursion , unlabored           Heart/CV- RRR , no murmur , no gallop  , no rub, nl s1 s2                           - JVD- none , edema- none, stasis changes- none, varices- none           Lung-  +distant/ clear, Wheeze- none, cough-none , dullness-none, rub- none,             Chest wall-  Abd-  Br/ Gen/ Rectal- Not done, not indicated Extrem- + she notes discoloration laterally L  great toenail, changed since podiatric procedure Neuro- grossly intact to observation

## 2023-11-07 ENCOUNTER — Ambulatory Visit: Admitting: Internal Medicine

## 2023-11-07 DIAGNOSIS — Z23 Encounter for immunization: Secondary | ICD-10-CM

## 2023-11-07 DIAGNOSIS — J3089 Other allergic rhinitis: Secondary | ICD-10-CM

## 2023-11-07 DIAGNOSIS — J302 Other seasonal allergic rhinitis: Secondary | ICD-10-CM

## 2023-11-07 MED ORDER — ALBUTEROL SULFATE (2.5 MG/3ML) 0.083% IN NEBU: 2.5000 mg | INHALATION_SOLUTION | Freq: Four times a day (QID) | RESPIRATORY_TRACT | 11 refills | Status: AC | PRN

## 2023-11-07 MED ORDER — ALBUTEROL SULFATE HFA 108 (90 BASE) MCG/ACT IN AERS: 2.0000 | INHALATION_SPRAY | Freq: Four times a day (QID) | RESPIRATORY_TRACT | 12 refills | Status: AC | PRN

## 2023-11-07 MED ORDER — TRELEGY ELLIPTA 100-62.5-25 MCG/ACT IN AEPB: 1.0000 | INHALATION_SPRAY | Freq: Every day | RESPIRATORY_TRACT | 12 refills | Status: AC

## 2023-11-07 MED ORDER — MODAFINIL 200 MG PO TABS: 200.0000 mg | ORAL_TABLET | Freq: Every day | ORAL | 5 refills | Status: AC

## 2023-11-07 MED ORDER — MONTELUKAST SODIUM 10 MG PO TABS: 10.0000 mg | ORAL_TABLET | Freq: Every day | ORAL | 3 refills | Status: AC

## 2023-11-07 NOTE — Patient Instructions (Signed)
 Glad you are doing well !  Meds refilled  Order flu vax senior

## 2023-11-11 ENCOUNTER — Encounter: Payer: Self-pay | Admitting: Internal Medicine

## 2023-11-14 ENCOUNTER — Encounter (HOSPITAL_BASED_OUTPATIENT_CLINIC_OR_DEPARTMENT_OTHER): Payer: Self-pay

## 2023-11-14 ENCOUNTER — Ambulatory Visit (HOSPITAL_BASED_OUTPATIENT_CLINIC_OR_DEPARTMENT_OTHER)
Admission: RE | Admit: 2023-11-14 | Discharge: 2023-11-14 | Disposition: A | Discharge status: Home / Self Care | Source: Ambulatory Visit | Attending: Hematology & Oncology | Admitting: Hematology & Oncology

## 2023-11-14 DIAGNOSIS — C6931 Malignant neoplasm of right choroid: Secondary | ICD-10-CM | POA: Diagnosis present

## 2023-11-14 MED ORDER — IOHEXOL 300 MG/ML  SOLN
100.0000 mL | Freq: Once | INTRAMUSCULAR | Status: AC | PRN
  Administered 2023-11-14: 100 mL via INTRAVENOUS

## 2023-11-15 ENCOUNTER — Ambulatory Visit: Payer: Self-pay | Admitting: Hematology & Oncology

## 2023-11-15 NOTE — Telephone Encounter (Signed)
 Advised via MyChart.

## 2023-11-15 NOTE — Telephone Encounter (Signed)
-----   Message from Maude JONELLE Crease sent at 11/15/2023 10:14 AM EST ----- Please call and let her know that the CT scan does not show any evidence of melanoma.  Great job.  Jeralyn ----- Message ----- From: Rebecka, Rad Results In Sent: 11/15/2023   8:36 AM EST To: Maude JONELLE Crease, MD

## 2023-12-14 ENCOUNTER — Encounter: Payer: Self-pay | Admitting: Family Medicine

## 2023-12-14 ENCOUNTER — Ambulatory Visit: Payer: Self-pay | Admitting: Family Medicine

## 2023-12-14 ENCOUNTER — Ambulatory Visit: Admitting: Family Medicine

## 2023-12-14 VITALS — BP 122/60 | HR 78 | Temp 97.9°F | Ht 62.5 in | Wt 141.5 lb

## 2023-12-14 DIAGNOSIS — Z0000 Encounter for general adult medical examination without abnormal findings: Secondary | ICD-10-CM

## 2023-12-14 DIAGNOSIS — R208 Other disturbances of skin sensation: Secondary | ICD-10-CM | POA: Diagnosis not present

## 2023-12-14 DIAGNOSIS — E559 Vitamin D deficiency, unspecified: Secondary | ICD-10-CM | POA: Diagnosis not present

## 2023-12-14 DIAGNOSIS — I10 Essential (primary) hypertension: Secondary | ICD-10-CM | POA: Diagnosis not present

## 2023-12-14 LAB — CBC WITH DIFFERENTIAL/PLATELET
Basophils Absolute: 0.1 K/uL (ref 0.0–0.1)
Basophils Relative: 1.2 % (ref 0.0–3.0)
Eosinophils Absolute: 0.2 K/uL (ref 0.0–0.7)
Eosinophils Relative: 2.4 % (ref 0.0–5.0)
HCT: 41.6 % (ref 36.0–46.0)
Hemoglobin: 13.9 g/dL (ref 12.0–15.0)
Lymphocytes Relative: 15.6 % (ref 12.0–46.0)
Lymphs Abs: 1.4 K/uL (ref 0.7–4.0)
MCHC: 33.5 g/dL (ref 30.0–36.0)
MCV: 96.3 fl (ref 78.0–100.0)
Monocytes Absolute: 0.7 K/uL (ref 0.1–1.0)
Monocytes Relative: 7.4 % (ref 3.0–12.0)
Neutro Abs: 6.5 K/uL (ref 1.4–7.7)
Neutrophils Relative %: 73.4 % (ref 43.0–77.0)
Platelets: 346 K/uL (ref 150.0–400.0)
RBC: 4.32 Mil/uL (ref 3.87–5.11)
RDW: 12.9 % (ref 11.5–15.5)
WBC: 8.9 K/uL (ref 4.0–10.5)

## 2023-12-14 LAB — TSH: TSH: 4.96 u[IU]/mL (ref 0.35–5.50)

## 2023-12-14 LAB — LIPID PANEL
Cholesterol: 251 mg/dL — ABNORMAL HIGH (ref 0–200)
HDL: 68.1 mg/dL (ref >39.00)
LDL Cholesterol: 153 mg/dL — ABNORMAL HIGH (ref 0–99)
NonHDL: 183.38
Total CHOL/HDL Ratio: 4
Triglycerides: 150 mg/dL — ABNORMAL HIGH (ref 0.0–149.0)
VLDL: 30 mg/dL (ref 0.0–40.0)

## 2023-12-14 LAB — BASIC METABOLIC PANEL WITH GFR
BUN: 15 mg/dL (ref 6–23)
CO2: 35 meq/L — ABNORMAL HIGH (ref 19–32)
Calcium: 10 mg/dL (ref 8.4–10.5)
Chloride: 95 meq/L — ABNORMAL LOW (ref 96–112)
Creatinine, Ser: 0.59 mg/dL (ref 0.40–1.20)
GFR: 93.56 mL/min (ref >60.00)
Glucose, Bld: 110 mg/dL — ABNORMAL HIGH (ref 70–99)
Potassium: 4.4 meq/L (ref 3.5–5.1)
Sodium: 139 meq/L (ref 135–145)

## 2023-12-14 LAB — HEPATIC FUNCTION PANEL
ALT: 17 U/L (ref 0–35)
AST: 17 U/L (ref 0–37)
Albumin: 4.5 g/dL (ref 3.5–5.2)
Alkaline Phosphatase: 98 U/L (ref 39–117)
Bilirubin, Direct: 0.1 mg/dL (ref 0.0–0.3)
Total Bilirubin: 0.4 mg/dL (ref 0.2–1.2)
Total Protein: 6.6 g/dL (ref 6.0–8.3)

## 2023-12-14 LAB — VITAMIN D 25 HYDROXY (VIT D DEFICIENCY, FRACTURES): VITD: 25 ng/mL — ABNORMAL LOW (ref 30.00–100.00)

## 2023-12-14 LAB — B12 AND FOLATE PANEL
Folate: >23.7 ng/mL (ref >5.9)
Vitamin B-12: 674 pg/mL (ref 211–911)

## 2023-12-14 MED ORDER — OMEPRAZOLE 40 MG PO CPDR: 40.0000 mg | DELAYED_RELEASE_CAPSULE | Freq: Every day | ORAL | 1 refills | Status: AC

## 2023-12-14 NOTE — Patient Instructions (Signed)
Follow up in 6 months to recheck BP and cholesterol We'll notify you of your lab results and make any changes if needed Continue to work on healthy diet and regular exercise- you're doing great! Call with any questions or concerns Stay Safe!  Stay Healthy! Happy Holidays!!! 

## 2023-12-14 NOTE — Progress Notes (Unsigned)
   Subjective:    Patient ID: Carol Quinn, female    DOB: 09-Mar-1955, 68 y.o.   MRN: [id number]  HPI CPE- UTD on mammo, lung cancer screen, colonoscopy, PNA, flu  Health Maintenance  Topic Date Due   Zoster Vaccines- Shingrix (2 of 2) 04/02/2019   DTaP/Tdap/Td (2 - Td or Tdap) 12/22/2022   COVID-19 Vaccine (6 - 2025-26 season) 11/29/2023   Medicare Annual Wellness (AWV)  02/08/2024   Mammogram  10/11/2024   Lung Cancer Screening  11/13/2024   Colonoscopy  03/21/2032   Pneumococcal Vaccine: 50+ Years  Completed   Influenza Vaccine  Completed   Bone Density Scan  Completed   Hepatitis C Screening  Completed   Meningococcal B Vaccine  Aged Out    Patient Care Team    Relationship Specialty Notifications Start End  Mahlon Comer BRAVO, MD PCP - General Family Medicine  07/17/10   Lavona Agent, MD PCP - Cardiology Cardiology  09/19/19   Kathy Vicenta POUR., MD  Obstetrics and Gynecology  02/14/14   Marvis Ronal BIRCH., MD Referring Physician Gastroenterology  02/14/14   Mahlon Comer BRAVO, MD Referring Physician Family Medicine  07/10/19       Review of Systems Patient reports no vision/hearing changes, adenopathy,fever, weight change,  persistant/recurrent hoarseness , swallowing issues, chest pain, palpitations, edema, persistant/recurrent cough, hemoptysis, gastrointestinal bleeding (melena, rectal bleeding), abdominal pain, significant heartburn, bowel changes, GU symptoms (dysuria, hematuria, incontinence), Gyn symptoms (abnormal  bleeding, pain),  syncope, focal weakness, memory loss, skin/hair/nail changes, abnormal bruising or bleeding, anxiety, or depression.   + L shoulder pain- no particular pattern, described as sharp and stabbing.  Resolves spontantously.  + R lower rib pain- episodic, no particular pattern  + burning of feet bilaterally    Objective:   Physical Exam General Appearance:    Alert, cooperative, no distress, appears stated age  Head:    Normocephalic, without  obvious abnormality, atraumatic  Eyes:    PERRL x1, EOM's intact both eyes  Ears:    Normal TM's and external ear canals, both ears  Nose:   Nares normal, septum midline, mucosa normal, no drainage    or sinus tenderness  Throat:   Lips, mucosa, and tongue normal; teeth and gums normal  Neck:   Supple, symmetrical, trachea midline, no adenopathy;    Thyroid : no enlargement/tenderness/nodules  Back:     Symmetric, no curvature, ROM normal, no CVA tenderness  Lungs:     Clear to auscultation bilaterally, respirations unlabored  Chest Wall:    No tenderness or deformity   Heart:    Regular rate and rhythm, S1 and S2 normal, no murmur, rub   or gallop  Breast Exam:    Deferred to GYN  Abdomen:     Soft, non-tender, bowel sounds active all four quadrants,    no masses, no organomegaly  Genitalia:    Deferred to GYN  Rectal:    Extremities:   Extremities normal, atraumatic, no cyanosis or edema  Pulses:   2+ and symmetric all extremities  Skin:   Skin color, texture, turgor normal, no rashes or lesions  Lymph nodes:   Cervical, supraclavicular, and axillary nodes normal  Neurologic:   CNII-XII intact, normal strength, sensation and reflexes    throughout          Assessment & Plan:

## 2023-12-15 MED ORDER — VITAMIN D (ERGOCALCIFEROL) 1.25 MG (50000 UNIT) PO CAPS: 50000.0000 [IU] | ORAL_CAPSULE | ORAL | 0 refills | Status: AC

## 2023-12-15 NOTE — Telephone Encounter (Signed)
 Patient has an old rx of Zetia , she is asking if it is okay for her take it?

## 2023-12-17 ENCOUNTER — Encounter: Payer: Self-pay | Admitting: Family Medicine

## 2023-12-17 NOTE — Assessment & Plan Note (Signed)
 Pt's PE unchanged and WNL w/ exception of prosthetic eye.  UTD on mammo, colonoscopy, lung cancer screen, PNA, and flu.  Check labs.  Anticipatory guidance provided.

## 2024-02-06 ENCOUNTER — Encounter: Payer: Self-pay | Admitting: Family Medicine

## 2024-02-09 ENCOUNTER — Other Ambulatory Visit: Payer: Self-pay

## 2024-02-09 MED ORDER — ESCITALOPRAM OXALATE 20 MG PO TABS: 20.0000 mg | ORAL_TABLET | Freq: Every day | ORAL | 6 refills | Status: AC

## 2024-02-14 ENCOUNTER — Ambulatory Visit

## 2024-02-14 VITALS — Ht 63.0 in | Wt 141.0 lb

## 2024-02-14 DIAGNOSIS — Z0000 Encounter for general adult medical examination without abnormal findings: Secondary | ICD-10-CM

## 2024-02-14 NOTE — Patient Instructions (Signed)
 Carol Quinn,  Thank you for taking the time for your Medicare Wellness Visit. I appreciate your continued commitment to your health goals. Please review the care plan we discussed, and feel free to reach out if I can assist you further.  Please note that Annual Wellness Visits do not include a physical exam. Some assessments may be limited, especially if the visit was conducted virtually. If needed, we may recommend an in-person follow-up with your provider.  Ongoing Care Seeing your primary care provider every 3 to 6 months helps us  monitor your health and provide consistent, personalized care.   Referrals If a referral was made during today's visit and you haven't received any updates within two weeks, please contact the referred provider directly to check on the status.  Recommended Screenings:  Health Maintenance  Topic Date Due   Zoster (Shingles) Vaccine (2 of 2) 04/02/2019   DTaP/Tdap/Td vaccine (2 - Td or Tdap) 12/22/2022   COVID-19 Vaccine (6 - 2025-26 season) 11/29/2023   Breast Cancer Screening  10/11/2024   Screening for Lung Cancer  11/13/2024   Medicare Annual Wellness Visit  02/13/2025   Colon Cancer Screening  03/21/2032   Pneumococcal Vaccine for age over 11  Completed   Flu Shot  Completed   Osteoporosis screening with Bone Density Scan  Completed   Hepatitis C Screening  Completed   Meningitis B Vaccine  Aged Out       02/14/2024   11:33 AM  Advanced Directives  Does Patient Have a Medical Advance Directive? Yes  Type of Advance Directive Healthcare Power of Attorney  Copy of Healthcare Power of Attorney in Chart? No - copy requested    Vision: Annual vision screenings are recommended for early detection of glaucoma, cataracts, and diabetic retinopathy. These exams can also reveal signs of chronic conditions such as diabetes and high blood pressure.  Dental: Annual dental screenings help detect early signs of oral cancer, gum disease, and other conditions  linked to overall health, including heart disease and diabetes.  Please see the attached documents for additional preventive care recommendations.    Carol Quinn , Thank you for taking time to come for your Medicare Wellness Visit. I appreciate your ongoing commitment to your health goals. Please review the following plan we discussed and let me know if I can assist you in the future.   Screening recommendations/referrals: Colonoscopy:  Mammogram:  Bone Density:  Recommended yearly ophthalmology/optometry visit for glaucoma screening and checkup Recommended yearly dental visit for hygiene and checkup  Vaccinations: Influenza vaccine:  Pneumococcal vaccine:  Tdap vaccine:  Shingles vaccine:         Preventive Care 65 Years and Older, Female Preventive care refers to lifestyle choices and visits with your health care provider that can promote health and wellness. What does preventive care include? A yearly physical exam. This is also called an annual well check. Dental exams once or twice a year. Routine eye exams. Ask your health care provider how often you should have your eyes checked. Personal lifestyle choices, including: Daily care of your teeth and gums. Regular physical activity. Eating a healthy diet. Avoiding tobacco and drug use. Limiting alcohol use. Practicing safe sex. Taking low-dose aspirin every day. Taking vitamin and mineral supplements as recommended by your health care provider. What happens during an annual well check? The services and screenings done by your health care provider during your annual well check will depend on your age, overall health, lifestyle risk factors, and family history  of disease. Counseling  Your health care provider may ask you questions about your: Alcohol use. Tobacco use. Drug use. Emotional well-being. Home and relationship well-being. Sexual activity. Eating habits. History of falls. Memory and ability to understand  (cognition). Work and work astronomer. Reproductive health. Screening  You may have the following tests or measurements: Height, weight, and BMI. Blood pressure. Lipid and cholesterol levels. These may be checked every 5 years, or more frequently if you are over 22 years old. Skin check. Lung cancer screening. You may have this screening every year starting at age 73 if you have a 30-pack-year history of smoking and currently smoke or have quit within the past 15 years. Fecal occult blood test (FOBT) of the stool. You may have this test every year starting at age 77. Flexible sigmoidoscopy or colonoscopy. You may have a sigmoidoscopy every 5 years or a colonoscopy every 10 years starting at age 79. Hepatitis C blood test. Hepatitis B blood test. Sexually transmitted disease (STD) testing. Diabetes screening. This is done by checking your blood sugar (glucose) after you have not eaten for a while (fasting). You may have this done every 1-3 years. Bone density scan. This is done to screen for osteoporosis. You may have this done starting at age 81. Mammogram. This may be done every 1-2 years. Talk to your health care provider about how often you should have regular mammograms. Talk with your health care provider about your test results, treatment options, and if necessary, the need for more tests. Vaccines  Your health care provider may recommend certain vaccines, such as: Influenza vaccine. This is recommended every year. Tetanus, diphtheria, and acellular pertussis (Tdap, Td) vaccine. You may need a Td booster every 10 years. Zoster vaccine. You may need this after age 59. Pneumococcal 13-valent conjugate (PCV13) vaccine. One dose is recommended after age 63. Pneumococcal polysaccharide (PPSV23) vaccine. One dose is recommended after age 15. Talk to your health care provider about which screenings and vaccines you need and how often you need them. This information is not intended to  replace advice given to you by your health care provider. Make sure you discuss any questions you have with your health care provider. Document Released: 01/23/2015 Document Revised: 09/16/2015 Document Reviewed: 10/28/2014 Elsevier Interactive Patient Education  2017 [employer].  Fall Prevention in the Home Falls can cause injuries. They can happen to people of all ages. There are many things you can do to make your home safe and to help prevent falls. What can I do on the outside of my home? Regularly fix the edges of walkways and driveways and fix any cracks. Remove anything that might make you trip as you walk through a door, such as a raised step or threshold. Trim any bushes or trees on the path to your home. Use bright outdoor lighting. Clear any walking paths of anything that might make someone trip, such as rocks or tools. Regularly check to see if handrails are loose or broken. Make sure that both sides of any steps have handrails. Any raised decks and porches should have guardrails on the edges. Have any leaves, snow, or ice cleared regularly. Use sand or salt on walking paths during winter. Clean up any spills in your garage right away. This includes oil or grease spills. What can I do in the bathroom? Use night lights. Install grab bars by the toilet and in the tub and shower. Do not use towel bars as grab bars. Use non-skid mats or  decals in the tub or shower. If you need to sit down in the shower, use a plastic, non-slip stool. Keep the floor dry. Clean up any water that spills on the floor as soon as it happens. Remove soap buildup in the tub or shower regularly. Attach bath mats securely with double-sided non-slip rug tape. Do not have throw rugs and other things on the floor that can make you trip. What can I do in the bedroom? Use night lights. Make sure that you have a light by your bed that is easy to reach. Do not use any sheets or blankets that are too big for  your bed. They should not hang down onto the floor. Have a firm chair that has side arms. You can use this for support while you get dressed. Do not have throw rugs and other things on the floor that can make you trip. What can I do in the kitchen? Clean up any spills right away. Avoid walking on wet floors. Keep items that you use a lot in easy-to-reach places. If you need to reach something above you, use a strong step stool that has a grab bar. Keep electrical cords out of the way. Do not use floor polish or wax that makes floors slippery. If you must use wax, use non-skid floor wax. Do not have throw rugs and other things on the floor that can make you trip. What can I do with my stairs? Do not leave any items on the stairs. Make sure that there are handrails on both sides of the stairs and use them. Fix handrails that are broken or loose. Make sure that handrails are as long as the stairways. Check any carpeting to make sure that it is firmly attached to the stairs. Fix any carpet that is loose or worn. Avoid having throw rugs at the top or bottom of the stairs. If you do have throw rugs, attach them to the floor with carpet tape. Make sure that you have a light switch at the top of the stairs and the bottom of the stairs. If you do not have them, ask someone to add them for you. What else can I do to help prevent falls? Wear shoes that: Do not have high heels. Have rubber bottoms. Are comfortable and fit you well. Are closed at the toe. Do not wear sandals. If you use a stepladder: Make sure that it is fully opened. Do not climb a closed stepladder. Make sure that both sides of the stepladder are locked into place. Ask someone to hold it for you, if possible. Clearly mark and make sure that you can see: Any grab bars or handrails. First and last steps. Where the edge of each step is. Use tools that help you move around (mobility aids) if they are needed. These  include: Canes. Walkers. Scooters. Crutches. Turn on the lights when you go into a dark area. Replace any light bulbs as soon as they burn out. Set up your furniture so you have a clear path. Avoid moving your furniture around. If any of your floors are uneven, fix them. If there are any pets around you, be aware of where they are. Review your medicines with your doctor. Some medicines can make you feel dizzy. This can increase your chance of falling. Ask your doctor what other things that you can do to help prevent falls. This information is not intended to replace advice given to you by your health care provider. Make sure  you discuss any questions you have with your health care provider. Document Released: 10/23/2008 Document Revised: 06/04/2015 Document Reviewed: 01/31/2014 Elsevier Interactive Patient Education  2017 [employer].

## 2024-02-14 NOTE — Progress Notes (Signed)
 "  Chief Complaint  Patient presents with   Medicare Wellness     Subjective:   Carol Quinn is a 69 y.o. female who presents for a Medicare Annual Wellness Visit.  Visit info / Clinical Intake: Medicare Wellness Visit Type:: Subsequent Annual Wellness Visit Persons participating in visit and providing information:: patient Medicare Wellness Visit Mode:: Video Since this visit was completed virtually, some vitals may be partially provided or unavailable. Missing vitals are due to the limitations of the virtual format.: Unable to obtain vitals - no equipment If Telephone or Video please confirm:: I connected with patient using audio/video enable telemedicine. I verified patient identity with two identifiers, discussed telehealth limitations, and patient agreed to proceed. Patient Location:: home Provider Location:: home Interpreter Needed?: No Pre-visit prep was completed: no AWV questionnaire completed by patient prior to visit?: no Living arrangements:: lives with spouse/significant other Patient's Overall Health Status Rating: (!) fair Typical amount of pain: some Does pain affect daily life?: (!) yes Are you currently prescribed opioids?: no  Dietary Habits and Nutritional Risks How many meals a day?: 3 Eats fruit and vegetables daily?: yes Most meals are obtained by: preparing own meals; eating out In the last 2 weeks, have you had any of the following?: none Diabetic:: no  Functional Status Activities of Daily Living (to include ambulation/medication): Independent Ambulation: Independent Medication Administration: Independent Home Management (perform basic housework or laundry): Independent Manage your own finances?: yes Primary transportation is: driving Concerns about vision?: no *vision screening is required for WTM* Concerns about hearing?: no  Fall Screening Falls in the past year?: 0 Number of falls in past year: 0 Was there an injury with Fall?: 0 Fall  Risk Category Calculator: 0 Patient Fall Risk Level: Low Fall Risk  Fall Risk Patient at Risk for Falls Due to: No Fall Risks Fall risk Follow up: Falls evaluation completed; Education provided; Falls prevention discussed  Home and Transportation Safety: All rugs have non-skid backing?: (!) no All stairs or steps have railings?: N/A, no stairs Grab bars in the bathtub or shower?: yes Have non-skid surface in bathtub or shower?: (!) no Good home lighting?: yes Regular seat belt use?: yes Hospital stays in the last year:: (!) yes How many hospital stays:: 1  Cognitive Assessment Difficulty concentrating, remembering, or making decisions? : no Will 6CIT or Mini Cog be Completed: yes What year is it?: 0 points What month is it?: 0 points Give patient an address phrase to remember (5 components): Its very sunny outside today in February About what time is it?: 0 points Count backwards from 20 to 1: 0 points Say the months of the year in reverse: 0 points Repeat the address phrase from earlier: 0 points 6 CIT Score: 0 points  Advance Directives (For Healthcare) Does Patient Have a Medical Advance Directive?: Yes Does patient want to make changes to medical advance directive?: No - Patient declined Type of Advance Directive: Healthcare Power of Attorney Copy of Healthcare Power of Attorney in Chart?: No - copy requested  Reviewed/Updated  Reviewed/Updated: Reviewed All (Medical, Surgical, Family, Medications, Allergies, Care Teams, Patient Goals); Surgical History; Family History; Medications; Allergies; Care Teams; Patient Goals; Medical History    Allergies (verified) Clarithromycin, Hydrocodone  bit-homatrop mbr, Hydrocodone  bit-homatrop mbr, Codeine, Penicillins, and Topiramate   Current Medications (verified) Outpatient Encounter Medications as of 02/14/2024  Medication Sig   albuterol  (PROVENTIL ) (2.5 MG/3ML) 0.083% nebulizer solution Take 3 mLs (2.5 mg total) by  nebulization every 6 (six) hours as  needed for wheezing or shortness of breath.   albuterol  (VENTOLIN  HFA) 108 (90 Base) MCG/ACT inhaler Inhale 2 puffs into the lungs every 6 (six) hours as needed for wheezing or shortness of breath.   ALPRAZolam  (XANAX ) 0.5 MG tablet Take 1 tablet (0.5 mg total) by mouth 2 (two) times daily as needed for anxiety.   aspirin EC 81 MG tablet Take 81 mg by mouth daily. Swallow whole.   aspirin-acetaminophen -caffeine (EXCEDRIN MIGRAINE) 250-250-65 MG tablet Take 2 tablets by mouth every 6 (six) hours as needed for headache.   buPROPion  (WELLBUTRIN  XL) 150 MG 24 hr tablet TAKE ONE TABLET BY MOUTH IN THE MORNING   cyclobenzaprine  (FLEXERIL ) 5 MG tablet Take 5 mg by mouth 3 (three) times daily as needed for muscle spasms.   Epinastine HCl 0.05 % ophthalmic solution Apply to eye.   escitalopram  (LEXAPRO ) 20 MG tablet Take 1 tablet (20 mg total) by mouth daily.   fexofenadine  (ALLEGRA ) 180 MG tablet Take 180 mg by mouth daily.   Fluticasone -Umeclidin-Vilant (TRELEGY ELLIPTA ) 100-62.5-25 MCG/ACT AEPB Inhale 1 puff into the lungs daily. Rinse mouth   meloxicam (MOBIC) 15 MG tablet Take 15 mg by mouth daily.   modafinil  (PROVIGIL ) 200 MG tablet Take 1 tablet (200 mg total) by mouth daily.   montelukast  (SINGULAIR ) 10 MG tablet Take 1 tablet (10 mg total) by mouth at bedtime.   Multiple Vitamins-Minerals (MULTIVITAMIN WITH MINERALS) tablet Take 1 tablet by mouth daily.   omeprazole  (PRILOSEC) 40 MG capsule Take 1 capsule (40 mg total) by mouth daily.   PREVIDENT 5000 BOOSTER PLUS 1.1 % PSTE See admin instructions.   traMADol  (ULTRAM ) 50 MG tablet 1 or 2 every 6 hours if needed for cough   valsartan -hydrochlorothiazide  (DIOVAN -HCT) 160-25 MG tablet Take 1 tablet by mouth daily.   varenicline  (CHANTIX ) 1 MG tablet TAKE 1 TABLET BY MOUTH TWICE A DAY   Vitamin D , Ergocalciferol , (DRISDOL ) 1.25 MG (50000 UNIT) CAPS capsule Take 1 capsule (50,000 Units total) by mouth every 7  (seven) days.   estradiol (VIVELLE-DOT) 0.0375 MG/24HR Place 1 patch onto the skin 2 (two) times a week. (Patient taking differently: Place 1 patch onto the skin 2 (two) times a week. Changes patch monthly.)   No facility-administered encounter medications on file as of 02/14/2024.    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   Oxygen deficiency    RBBB (right bundle branch block with left anterior fascicular block)    Tubular adenoma of colon    Past Surgical History:  Procedure Laterality Date   BIOPSY  03/22/2022   Procedure: BIOPSY;  Surgeon: Albertus Gordy HERO, MD;  Location: [facility];  Service: Gastroenterology;;   BREAST BIOPSY Left 2002   COLONOSCOPY WITH PROPOFOL  N/A 03/21/2017   Procedure:  COLONOSCOPY WITH PROPOFOL ;  Surgeon: Albertus Gordy HERO, MD;  Location: [facility];  Service: Gastroenterology;  Laterality: N/A;   COLONOSCOPY WITH PROPOFOL  N/A 03/22/2022   Procedure: COLONOSCOPY WITH PROPOFOL ;  Surgeon: Albertus Gordy HERO, MD;  Location: [facility];  Service: Gastroenterology;  Laterality: N/A;   ESOPHAGOGASTRODUODENOSCOPY (EGD) WITH PROPOFOL  N/A 03/22/2022   Procedure: ESOPHAGOGASTRODUODENOSCOPY (EGD) WITH PROPOFOL ;  Surgeon: Albertus Gordy HERO, MD;  Location: [facility];  Service: Gastroenterology;  Laterality: N/A;   NOSE SURGERY     nasal deviation   POLYPECTOMY  03/22/2022   Procedure: POLYPECTOMY;  Surgeon: Albertus Gordy HERO, MD;  Location: [facility];  Service: Gastroenterology;;   TOTAL ABDOMINAL HYSTERECTOMY     TRIGGER FINGER RELEASE Right 06/05/2018   Procedure: RIGHT THUMB RELEASE TRIGGER FINGER/A-1 PULLEY;  Surgeon: Murrell Kuba, MD;  Location: [facility];  Service: Orthopedics;  Laterality: Right;   TUBAL LIGATION     Family History  Problem Relation Age of Onset   Emphysema Mother    Heart attack Mother 79       Died age 58   Heart failure Father 79   Asthma Sister    Breast cancer Sister 16   Cancer Sister    Leukemia Brother    Asthma Maternal Uncle    Arthritis Maternal Grandmother    Cancer Maternal Grandmother    Breast cancer Maternal Grandmother    Cancer Other        aunts   Cancer Other        uncles   Colon cancer Other    Esophageal cancer Neg Hx    Stomach cancer Neg Hx    Rectal cancer Neg Hx    Social History   Occupational History   Occupation: Human Resources Officer    Comment: [employer]  Tobacco Use   Smoking status: Former    Current packs/day: 0.00    Average packs/day: 0.5 packs/day for 40.0 years (20.0 ttl pk-yrs)    Types: Cigarettes    Start date: 08/1981    Quit date: 08/2021    Years since quitting: 2.5    Passive exposure: Current   Smokeless tobacco: Never  Vaping Use   Vaping status: Former  Substance and Sexual Activity   Alcohol use: Yes    Comment: rarely   Drug use: No   Sexual activity: Not Currently   Tobacco Counseling Counseling given: Not Answered  SDOH Screenings   Food Insecurity: No Food Insecurity (02/14/2024)  Housing: Unknown (02/14/2024)  Transportation Needs: No Transportation Needs (02/14/2024)  Utilities: Not At Risk (02/14/2024)  Alcohol Screen: Low Risk (02/08/2023)  Depression (PHQ2-9): Medium Risk (02/14/2024)  Financial Resource Strain: High Risk (02/08/2023)  Physical Activity: Inactive (02/14/2024)  Social Connections: Moderately Isolated (02/14/2024)  Stress: No Stress Concern Present  (02/14/2024)  Tobacco Use: Medium Risk (02/14/2024)  Health Literacy: Adequate Health Literacy (02/14/2024)   See flowsheets for full screening details  Depression Screen PHQ 2 & 9 Depression Scale- Over the past 2 weeks, how often have you been bothered by any of the following problems? Little interest or pleasure in doing things: 3 Feeling down, depressed, or hopeless (PHQ Adolescent also includes...irritable): 1 PHQ-2 Total Score: 4 Trouble falling or staying asleep, or sleeping too much: 2 Feeling tired or having little energy: 3 Poor appetite or overeating (PHQ Adolescent also includes...weight loss): 0 Feeling bad about yourself - or that you are a failure or have let yourself or your family down: 0 Trouble concentrating  on things, such as reading the newspaper or watching television [facility] Adolescent also includes...like school work): 0 Moving or speaking so slowly that other people could have noticed. Or the opposite - being so fidgety or restless that you have been moving around a lot more than usual: 0 Thoughts that you would be better off dead, or of hurting yourself in some way: 0 PHQ-9 Total Score: 9 If you checked off any problems, how difficult have these problems made it for you to do your work, take care of things at home, or get along with other people?: Somewhat difficult  Depression Treatment Depression Interventions/Treatment : Currently on Treatment; Medication     Goals Addressed             This Visit's Progress    Patient Stated       Be more physical fit             Objective:    Today's Vitals   02/14/24 1130  Weight: 141 lb (64 kg)  Height: 5' 3 (1.6 m)   Body mass index is 24.98 kg/m.  Hearing/Vision screen Hearing Screening - Comments:: Bilateral hearing aids Vision Screening - Comments:: Up to date [place]ater[place]   ocular oncologist  Hadziahmetodic    Harriette          all at [facility] Immunizations and Health Maintenance Health Maintenance   Topic Date Due   Zoster Vaccines- Shingrix (2 of 2) 04/02/2019   DTaP/Tdap/Td (2 - Td or Tdap) 12/22/2022   COVID-19 Vaccine (6 - 2025-26 season) 11/29/2023   Mammogram  10/11/2024   Lung Cancer Screening  11/13/2024   Medicare Annual Wellness (AWV)  02/13/2025   Colonoscopy  03/21/2032   Pneumococcal Vaccine: 50+ Years  Completed   Influenza Vaccine  Completed   Bone Density Scan  Completed   Hepatitis C Screening  Completed   Meningococcal B Vaccine  Aged Out        Assessment/Plan:  This is a routine wellness examination for Swan.  Patient Care Team: Mahlon Comer BRAVO, MD as PCP - General (Family Medicine) Lavona Agent, MD as PCP - Cardiology (Cardiology) Kathy Vicenta POUR., MD (Obstetrics and Gynecology) Marvis Ronal BIRCH., MD as Referring Physician (Gastroenterology) Mahlon Comer BRAVO, MD as Referring Physician (Family Medicine)  I have personally reviewed and noted the following in the patients chart:   Medical and social history Use of alcohol, tobacco or illicit drugs  Current medications and supplements including opioid prescriptions. Functional ability and status Nutritional status Physical activity Advanced directives List of other physicians Hospitalizations, surgeries, and ER visits in previous 12 months Vitals Screenings to include cognitive, depression, and falls Referrals and appointments  No orders of the defined types were placed in this encounter.  In addition, I have reviewed and discussed with patient certain preventive protocols, quality metrics, and best practice recommendations. A written personalized care plan for preventive services as well as general preventive health recommendations were provided to patient.   Kolbi Altadonna, LPN   07/15/7971   Return in 1 year (on 02/13/2025).  After Visit Summary: (MyChart) Due to this being a telephonic visit, the after visit summary with patients personalized plan was offered to patient via MyChart    Nurse Notes:  "

## 2024-04-19 ENCOUNTER — Ambulatory Visit: Admitting: Hematology & Oncology

## 2024-04-19 ENCOUNTER — Other Ambulatory Visit

## 2024-06-13 ENCOUNTER — Ambulatory Visit: Admitting: Family Medicine
